# Patient Record
Sex: Male | Born: 1980 | Race: Black or African American | Hispanic: No | Marital: Single
Health system: Southern US, Community
[De-identification: ages and names within clinical notes are randomized; demographics above are authoritative.]

## PROBLEM LIST (undated history)

## (undated) DIAGNOSIS — R1033 Periumbilical pain: Secondary | ICD-10-CM

## (undated) DIAGNOSIS — F1290 Cannabis use, unspecified, uncomplicated: Secondary | ICD-10-CM

## (undated) DIAGNOSIS — T189XXA Foreign body of alimentary tract, part unspecified, initial encounter: Secondary | ICD-10-CM

## (undated) DIAGNOSIS — E039 Hypothyroidism, unspecified: Secondary | ICD-10-CM

## (undated) DIAGNOSIS — F609 Personality disorder, unspecified: Secondary | ICD-10-CM

## (undated) DIAGNOSIS — K439 Ventral hernia without obstruction or gangrene: Secondary | ICD-10-CM

## (undated) DIAGNOSIS — F4310 Post-traumatic stress disorder, unspecified: Secondary | ICD-10-CM

## (undated) DIAGNOSIS — F79 Unspecified intellectual disabilities: Secondary | ICD-10-CM

## (undated) DIAGNOSIS — F250 Schizoaffective disorder, bipolar type: Secondary | ICD-10-CM

## (undated) DIAGNOSIS — K5909 Other constipation: Secondary | ICD-10-CM

## (undated) DIAGNOSIS — K219 Gastro-esophageal reflux disease without esophagitis: Secondary | ICD-10-CM

## (undated) HISTORY — DX: Hypothyroidism, unspecified: E03.9

## (undated) HISTORY — DX: Gastro-esophageal reflux disease without esophagitis: K21.9

## (undated) HISTORY — DX: Other constipation: K59.09

## (undated) HISTORY — PX: FRACTURE SURGERY: SHX138

## (undated) HISTORY — DX: Ventral hernia without obstruction or gangrene: K43.9

## (undated) HISTORY — DX: Periumbilical pain: R10.33

## (undated) HISTORY — DX: Foreign body of alimentary tract, part unspecified, initial encounter: T18.9XXA

---

## 1999-02-09 ENCOUNTER — Inpatient Hospital Stay (HOSPITAL_COMMUNITY): Admission: RE | Admit: 1999-02-09 | Discharge: 1999-02-16 | Payer: Self-pay | Admitting: *Deleted

## 2008-04-27 ENCOUNTER — Emergency Department (HOSPITAL_COMMUNITY): Admission: EM | Admit: 2008-04-27 | Discharge: 2008-04-27 | Payer: Self-pay | Admitting: Emergency Medicine

## 2008-06-16 ENCOUNTER — Emergency Department (HOSPITAL_COMMUNITY): Admission: EM | Admit: 2008-06-16 | Discharge: 2008-06-19 | Payer: Self-pay | Admitting: Emergency Medicine

## 2008-06-17 ENCOUNTER — Inpatient Hospital Stay (HOSPITAL_COMMUNITY): Admission: AD | Admit: 2008-06-17 | Discharge: 2008-06-19 | Payer: Self-pay | Admitting: Psychiatry

## 2008-06-17 ENCOUNTER — Ambulatory Visit: Payer: Self-pay | Admitting: Psychiatry

## 2008-07-13 ENCOUNTER — Emergency Department (HOSPITAL_COMMUNITY): Admission: EM | Admit: 2008-07-13 | Discharge: 2008-07-14 | Payer: Self-pay | Admitting: Emergency Medicine

## 2008-07-14 ENCOUNTER — Inpatient Hospital Stay (HOSPITAL_COMMUNITY): Admission: EM | Admit: 2008-07-14 | Discharge: 2008-07-18 | Payer: Self-pay | Admitting: Psychiatry

## 2008-07-14 ENCOUNTER — Emergency Department (HOSPITAL_COMMUNITY): Admission: EM | Admit: 2008-07-14 | Discharge: 2008-07-14 | Payer: Self-pay | Admitting: Emergency Medicine

## 2010-09-14 LAB — COMPREHENSIVE METABOLIC PANEL
ALT: 15 U/L (ref 0–53)
Albumin: 4.1 g/dL (ref 3.5–5.2)
Alkaline Phosphatase: 74 U/L (ref 39–117)
Calcium: 9.4 mg/dL (ref 8.4–10.5)
Creatinine, Ser: 0.88 mg/dL (ref 0.4–1.5)
GFR calc Af Amer: >60 mL/min (ref >60)
Glucose, Bld: 80 mg/dL (ref 70–99)
Total Bilirubin: 0.7 mg/dL (ref 0.3–1.2)

## 2010-09-14 LAB — VALPROIC ACID LEVEL: Valproic Acid Lvl: 61.4 ug/mL (ref 50.0–100.0)

## 2010-09-14 LAB — TSH: TSH: 0.985 u[IU]/mL (ref 0.350–4.500)

## 2010-09-14 LAB — DIFFERENTIAL
Basophils Relative: 1 % (ref 0–1)
Eosinophils Relative: 2 % (ref 0–5)
Lymphs Abs: 1.1 10*3/uL (ref 0.7–4.0)
Monocytes Absolute: 0.7 10*3/uL (ref 0.1–1.0)
Monocytes Relative: 16 % — ABNORMAL HIGH (ref 3–12)
Neutrophils Relative %: 57 % (ref 43–77)

## 2010-09-14 LAB — URINALYSIS, ROUTINE W REFLEX MICROSCOPIC
Bilirubin Urine: NEGATIVE
Hgb urine dipstick: NEGATIVE
Nitrite: NEGATIVE
Protein, ur: NEGATIVE mg/dL
Urobilinogen, UA: 0.2 mg/dL (ref 0.0–1.0)

## 2010-09-14 LAB — CBC
HCT: 41.4 % (ref 39.0–52.0)
Hemoglobin: 13.4 g/dL (ref 13.0–17.0)
RBC: 4.82 MIL/uL (ref 4.22–5.81)
RDW: 13.6 % (ref 11.5–15.5)
WBC: 4.5 10*3/uL (ref 4.0–10.5)

## 2010-09-14 LAB — SALICYLATE LEVEL: Salicylate Lvl: <4 mg/dL (ref 2.8–20.0)

## 2010-09-14 LAB — RAPID URINE DRUG SCREEN, HOSP PERFORMED: Amphetamines: NOT DETECTED

## 2010-09-14 LAB — ETHANOL: Alcohol, Ethyl (B): <5 mg/dL (ref 0–10)

## 2010-09-14 LAB — LITHIUM LEVEL: Lithium Lvl: <0.25 mEq/L — ABNORMAL LOW (ref 0.80–1.40)

## 2010-09-15 LAB — DIFFERENTIAL
Basophils Relative: 1 % (ref 0–1)
Eosinophils Relative: 2 % (ref 0–5)
Lymphocytes Relative: 26 % (ref 12–46)
Monocytes Relative: 12 % (ref 3–12)
Neutro Abs: 4.8 10*3/uL (ref 1.7–7.7)
Neutrophils Relative %: 59 % (ref 43–77)

## 2010-09-15 LAB — CBC
Hemoglobin: 13.4 g/dL (ref 13.0–17.0)
MCHC: 32.8 g/dL (ref 30.0–36.0)
WBC: 8.2 10*3/uL (ref 4.0–10.5)

## 2010-09-15 LAB — RAPID URINE DRUG SCREEN, HOSP PERFORMED
Amphetamines: NOT DETECTED
Barbiturates: NOT DETECTED
Benzodiazepines: NOT DETECTED
Opiates: NOT DETECTED
Tetrahydrocannabinol: NOT DETECTED

## 2010-09-15 LAB — POCT I-STAT, CHEM 8
BUN: 13 mg/dL (ref 6–23)
Calcium, Ion: 1.2 mmol/L (ref 1.12–1.32)
HCT: 43 % (ref 39.0–52.0)
Potassium: 4.4 mEq/L (ref 3.5–5.1)
Sodium: 139 mEq/L (ref 135–145)
TCO2: 25 mmol/L (ref 0–100)

## 2010-09-15 LAB — URINALYSIS, ROUTINE W REFLEX MICROSCOPIC: Specific Gravity, Urine: 1.029 (ref 1.005–1.030)

## 2010-09-15 LAB — ETHANOL: Alcohol, Ethyl (B): 6 mg/dL (ref 0–10)

## 2010-10-13 NOTE — Discharge Summary (Signed)
NAMECOLLIN, Lawrence Morgan               ACCOUNT NO.:  [id number]   MEDICAL RECORD NO.:  [id number]          PATIENT TYPE:  IPS   LOCATION:  0405                          FACILITY:  [facility]   PHYSICIAN:  Anselm Jungling, MD  DATE OF BIRTH:  03/14/1981   DATE OF ADMISSION:  07/14/2008  DATE OF DISCHARGE:  07/18/2008                               DISCHARGE SUMMARY   IDENTIFYING DATA AND REASON FOR ADMISSION:  This was an inpatient  psychiatric admission for Lawrence Morgan, a 30 year old single African American  male with a long psychiatric history.  He was admitted due to  exacerbation of symptoms of chronic schizophrenia.  Please refer to the  admission note for further details pertaining to the symptoms,  circumstances and history that led to his hospitalization.  He was given  an initial Axis I diagnosis of schizoaffective disorder, NOS.   MEDICAL AND LABORATORY:  The patient was in good health without any  active or chronic medical problems, except for a recent ankle sprain,  for which he was given ibuprofen.  He was medically and physically  assessed by the psychiatric nurse practitioner.  There were no  significant medical issues.   HOSPITAL COURSE:  The patient was admitted to the adult inpatient  psychiatric service.  He presented as a well-nourished, normally-  developed [place] American male who was not interested in engaging with  the undersigned or the treatment staff for the most part.  He preferred  to spend his time in his room, mostly in bed, and needed a great deal of  encouragement to get up and attend groups.  He stated that he thought he  needed a brain transplant and this appeared to be not a comical  remark, but an actual reflection of his delusionality.   He had been a client of the [employer], and we were in touch with them throughout his stay.  The patient  was continued on his usual combination of Depakote and Risperdal,  including  Risperdal Consta 50 mg IM q.14 days.   The patient appeared to function at or near his normal baseline level.  We worked with the ACT team case managers towards a discharge plan.  By  the fifth hospital day, it was clear that the patient would not benefit  from further inpatient psychiatric services, and was ready to resume  services under the auspices of the [facility] team.   AFTERCARE:  As above.  The patient was to return to the services of the  ACT team on the day of discharge, July 18, 2008.   DISCHARGE MEDICATIONS:  1. Trazodone 50 mg nightly.  2. Depakote 1000 mg nightly.  3. Risperdal 3 mg nightly.  4. Risperdal Consta 50 mg IM q.14 days, next due July 31, 2008.  5. Ibuprofen 400-800 mg p.o. q.6 h., p.r.n. ankle pain.   DISCHARGE DIAGNOSES:  Axis I:  Schizoaffective disorder, not otherwise  specified.  Axis II:  Deferred.  Axis III:  Ankle sprain.  Axis IV:  Stressors severe.  Axis V:  Global Assessment of Functioning on  discharge 50.      Anselm Jungling, MD  Electronically Signed     SPB/MEDQ  D:  07/18/2008  T:  07/18/2008  Job:  [id number] [phone]

## 2010-10-13 NOTE — Discharge Summary (Signed)
NAMELAQUAN, BEIER               ACCOUNT NO.:  [id number]   MEDICAL RECORD NO.:  [id number]          PATIENT TYPE:  IPS   LOCATION:  0501                          FACILITY:  [facility]   PHYSICIAN:  Geoffery Lyons, M.D.      DATE OF BIRTH:  [birth year]   DATE OF ADMISSION:  06/17/2008  DATE OF DISCHARGE:  06/19/2008                               DISCHARGE SUMMARY   CHIEF COMPLAINT:  This was the first admission to [facility] for this 30 year old male who was staying at the shelter and  seemed to have drunk Clorox.  He is not clear as far as what happened.  Initially claimed he was upset with staff at the shelter.  They said he  was responding to auditory hallucinations.  Upon this assessment, he  denied any suicidal ideations.   PAST PSYCHIATRIC HISTORY:  Being followed up at the [facility] [place].  Not sure about the medications.  Caseworker endorsed that he was taking  Risperdal and Depakote, possibly Risperdal Consta and lithium.   ALCOHOL AND DRUG HISTORY:  Noncontributory.   MEDICATIONS:  1. Risperdal Consta 50 mg IM every 14 days.  2. Depakote 750 mg at night.  3. Risperdal 30 mg at night.  4. Lithium 300 mg.  Not clear how he was supposed to be taking it.   PHYSICAL EXAMINATION:  Failed to show any acute findings.   LABORATORY WORKUP:  TSH 0.985, Depakote level 61.4, lithium less than  0.25.   MENTAL STATUS EXAMINATION:  Initially very agitated, wanted his clothes.  Raised his voice, yelled, did not want to come out of the room, stayed  in the bed.  Later the same morning was told that the clothes were  identified and clean, he was more settled.  Much calmer in one-on-one  interaction, wanting to get a letter so he could go back to the shelter.  He minimized and he denied, did not want his family contacted.  Resistant to thinking about getting any other placement other than the  shelter.  No evidence of active delusions, although he was very reserved  and  guarded.  No evidence of active hallucinations, although initially  he endorsed that he heard voices.  Cognition very concrete, but oriented  to person, place and time.  Some gaps in memory.  Endorsed no suicidal  ideas.   ADMITTING DIAGNOSES:  AXIS I:  Schizophrenia, rule out schizoaffective  disorder, borderline intellectual functioning.  AXIS II:  No diagnosis.  AXIS III:  No diagnosis.  AXIS IV:  Moderate.  AXIS V:  Upon admission 35.  Highest GAF in the last year 60.   We contacted the [facility] and we contacted the [facility].  We  were informed that he refused to live in a group home and he preferred  to leave in the street.  He was going back to the shelter.  January 20th  he was in full contact with reality.  Endorsed no active suicidal or  homicidal ideas, no hallucinations or delusions.  Endorsed he was much  better and objectively he  was more spontaneous.  No evidence of acute  psychopathology.  Go back to the shelter.  Claimed saving his money to  eventually get a place.   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  AXIS II:  No diagnosis.  AXIS III:  No diagnosis.  AXIS IV: Moderate.  AXIS V:  On discharge 50.   DISCHARGE MEDICATIONS:  1. Depakote 500 mg 2 at night.  2. Risperdal 3 mg at night.  3. Trazodone 50 one at bedtime for sleep.  4. Continued Consta as per [facility].   FOLLOWUP:  [facility] in [facility]      Geoffery Lyons, M.D.  Electronically Signed     IL/MEDQ  D:  07/02/2008  T:  07/02/2008  Job:  [id number]

## 2016-06-01 DIAGNOSIS — Z79899 Other long term (current) drug therapy: Secondary | ICD-10-CM | POA: Diagnosis not present

## 2016-06-01 DIAGNOSIS — F250 Schizoaffective disorder, bipolar type: Secondary | ICD-10-CM | POA: Diagnosis not present

## 2016-06-10 DIAGNOSIS — F250 Schizoaffective disorder, bipolar type: Secondary | ICD-10-CM | POA: Diagnosis not present

## 2016-06-17 DIAGNOSIS — Z131 Encounter for screening for diabetes mellitus: Secondary | ICD-10-CM | POA: Diagnosis not present

## 2016-06-17 DIAGNOSIS — F200 Paranoid schizophrenia: Secondary | ICD-10-CM | POA: Diagnosis not present

## 2016-06-17 DIAGNOSIS — Z79899 Other long term (current) drug therapy: Secondary | ICD-10-CM | POA: Diagnosis not present

## 2016-06-17 DIAGNOSIS — Z13220 Encounter for screening for lipoid disorders: Secondary | ICD-10-CM | POA: Diagnosis not present

## 2016-06-17 DIAGNOSIS — N3281 Overactive bladder: Secondary | ICD-10-CM | POA: Diagnosis not present

## 2016-06-29 DIAGNOSIS — F250 Schizoaffective disorder, bipolar type: Secondary | ICD-10-CM | POA: Diagnosis not present

## 2016-06-29 DIAGNOSIS — Z79899 Other long term (current) drug therapy: Secondary | ICD-10-CM | POA: Diagnosis not present

## 2016-07-01 DIAGNOSIS — F17210 Nicotine dependence, cigarettes, uncomplicated: Secondary | ICD-10-CM | POA: Diagnosis not present

## 2016-07-01 DIAGNOSIS — J209 Acute bronchitis, unspecified: Secondary | ICD-10-CM | POA: Diagnosis not present

## 2016-07-01 DIAGNOSIS — J302 Other seasonal allergic rhinitis: Secondary | ICD-10-CM | POA: Diagnosis not present

## 2016-07-01 DIAGNOSIS — Z716 Tobacco abuse counseling: Secondary | ICD-10-CM | POA: Diagnosis not present

## 2016-07-09 DIAGNOSIS — F250 Schizoaffective disorder, bipolar type: Secondary | ICD-10-CM | POA: Diagnosis not present

## 2016-07-20 DIAGNOSIS — F250 Schizoaffective disorder, bipolar type: Secondary | ICD-10-CM | POA: Diagnosis not present

## 2016-07-20 DIAGNOSIS — Z79899 Other long term (current) drug therapy: Secondary | ICD-10-CM | POA: Diagnosis not present

## 2016-07-29 DIAGNOSIS — J302 Other seasonal allergic rhinitis: Secondary | ICD-10-CM | POA: Diagnosis not present

## 2016-07-29 DIAGNOSIS — N3281 Overactive bladder: Secondary | ICD-10-CM | POA: Diagnosis not present

## 2016-07-29 DIAGNOSIS — L308 Other specified dermatitis: Secondary | ICD-10-CM | POA: Diagnosis not present

## 2016-07-29 DIAGNOSIS — F200 Paranoid schizophrenia: Secondary | ICD-10-CM | POA: Diagnosis not present

## 2016-08-05 DIAGNOSIS — F250 Schizoaffective disorder, bipolar type: Secondary | ICD-10-CM | POA: Diagnosis not present

## 2016-08-20 DIAGNOSIS — Z79899 Other long term (current) drug therapy: Secondary | ICD-10-CM | POA: Diagnosis not present

## 2016-09-02 DIAGNOSIS — F250 Schizoaffective disorder, bipolar type: Secondary | ICD-10-CM | POA: Diagnosis not present

## 2016-09-22 DIAGNOSIS — Z79899 Other long term (current) drug therapy: Secondary | ICD-10-CM | POA: Diagnosis not present

## 2016-09-29 DIAGNOSIS — F250 Schizoaffective disorder, bipolar type: Secondary | ICD-10-CM | POA: Diagnosis not present

## 2016-09-30 DIAGNOSIS — F250 Schizoaffective disorder, bipolar type: Secondary | ICD-10-CM | POA: Diagnosis not present

## 2016-10-06 DIAGNOSIS — F1211 Cannabis abuse, in remission: Secondary | ICD-10-CM | POA: Diagnosis not present

## 2016-10-06 DIAGNOSIS — Z79899 Other long term (current) drug therapy: Secondary | ICD-10-CM | POA: Diagnosis not present

## 2016-10-15 DIAGNOSIS — F1211 Cannabis abuse, in remission: Secondary | ICD-10-CM | POA: Diagnosis not present

## 2016-10-15 DIAGNOSIS — Z79899 Other long term (current) drug therapy: Secondary | ICD-10-CM | POA: Diagnosis not present

## 2016-10-28 DIAGNOSIS — F250 Schizoaffective disorder, bipolar type: Secondary | ICD-10-CM | POA: Diagnosis not present

## 2016-11-16 DIAGNOSIS — Z79899 Other long term (current) drug therapy: Secondary | ICD-10-CM | POA: Diagnosis not present

## 2016-11-16 DIAGNOSIS — F1211 Cannabis abuse, in remission: Secondary | ICD-10-CM | POA: Diagnosis not present

## 2016-11-25 DIAGNOSIS — F250 Schizoaffective disorder, bipolar type: Secondary | ICD-10-CM | POA: Diagnosis not present

## 2016-12-03 DIAGNOSIS — F250 Schizoaffective disorder, bipolar type: Secondary | ICD-10-CM | POA: Diagnosis not present

## 2016-12-03 DIAGNOSIS — F4310 Post-traumatic stress disorder, unspecified: Secondary | ICD-10-CM | POA: Diagnosis not present

## 2016-12-03 DIAGNOSIS — F1211 Cannabis abuse, in remission: Secondary | ICD-10-CM | POA: Diagnosis not present

## 2016-12-10 DIAGNOSIS — F1211 Cannabis abuse, in remission: Secondary | ICD-10-CM | POA: Diagnosis not present

## 2016-12-10 DIAGNOSIS — Z79899 Other long term (current) drug therapy: Secondary | ICD-10-CM | POA: Diagnosis not present

## 2016-12-14 DIAGNOSIS — F250 Schizoaffective disorder, bipolar type: Secondary | ICD-10-CM | POA: Diagnosis not present

## 2016-12-16 DIAGNOSIS — Z716 Tobacco abuse counseling: Secondary | ICD-10-CM | POA: Diagnosis not present

## 2016-12-16 DIAGNOSIS — B351 Tinea unguium: Secondary | ICD-10-CM | POA: Diagnosis not present

## 2016-12-16 DIAGNOSIS — F17210 Nicotine dependence, cigarettes, uncomplicated: Secondary | ICD-10-CM | POA: Diagnosis not present

## 2016-12-16 DIAGNOSIS — K5909 Other constipation: Secondary | ICD-10-CM | POA: Diagnosis not present

## 2016-12-16 DIAGNOSIS — F200 Paranoid schizophrenia: Secondary | ICD-10-CM | POA: Diagnosis not present

## 2016-12-16 DIAGNOSIS — R634 Abnormal weight loss: Secondary | ICD-10-CM | POA: Diagnosis not present

## 2016-12-23 DIAGNOSIS — F250 Schizoaffective disorder, bipolar type: Secondary | ICD-10-CM | POA: Diagnosis not present

## 2017-01-07 DIAGNOSIS — Z79899 Other long term (current) drug therapy: Secondary | ICD-10-CM | POA: Diagnosis not present

## 2017-01-17 ENCOUNTER — Ambulatory Visit: Payer: Self-pay | Admitting: Podiatry

## 2017-01-20 DIAGNOSIS — F250 Schizoaffective disorder, bipolar type: Secondary | ICD-10-CM | POA: Diagnosis not present

## 2017-02-02 ENCOUNTER — Ambulatory Visit (INDEPENDENT_AMBULATORY_CARE_PROVIDER_SITE_OTHER): Payer: Medicare Other | Admitting: Podiatry

## 2017-02-02 ENCOUNTER — Encounter: Payer: Self-pay | Admitting: Podiatry

## 2017-02-02 VITALS — Ht 69.0 in | Wt 180.0 lb

## 2017-02-02 DIAGNOSIS — B351 Tinea unguium: Secondary | ICD-10-CM | POA: Diagnosis not present

## 2017-02-02 DIAGNOSIS — L600 Ingrowing nail: Secondary | ICD-10-CM

## 2017-02-02 NOTE — Progress Notes (Signed)
SUBJECTIVE: 36 y.o. year old male presents complaining of painful nail on right great toe. Patient request the nail be removed surgically.  REVIEW OF SYSTEMS: A comprehensive review of systems was negative except for: being treated fro schizophrenic and lives with his care taker.  OBJECTIVE: DERMATOLOGIC EXAMINATION: Nails: Thick dystrophic nail painful in closed in shoes. Abnormal nail growth protruding dorsally right great toe.   VASCULAR EXAMINATION OF LOWER LIMBS: Pedal pulses: All pedal pulses are palpable with normal pulsation.  Capillary Filling times within 3 seconds in all digits.  No edema or erythema noted. Temperature gradient from tibial crest to dorsum of foot is within normal bilateral.  NEUROLOGIC EXAMINATION OF THE LOWER LIMBS: Achilles DTR is present and within normal. Monofilament (Semmes-Weinstein 10-gm) sensory testing positive 6 out of 6, bilateral. Vibratory sensations(128Hz  turning fork) intact at medial and lateral forefoot bilateral.  Sharp and Dull discriminatory sensations at the plantar ball of hallux is intact bilateral.   MUSCULOSKELETAL EXAMINATION: No gross deformities noted.  ASSESSMENT: Onychomycosis. Abnormal nail growth right great toe. Pain on foot.  PLAN: Reviewed findings and available treatment options.  Explained high recurrence rate after permanent nail removal due to deformity. All nails debrided. Return in 3 month.

## 2017-02-02 NOTE — Patient Instructions (Addendum)
Seen for painful nails. Reviewed findings and available options. All nails debrided. Return in 3 months or as needed.

## 2017-02-03 ENCOUNTER — Encounter: Payer: Self-pay | Admitting: Podiatry

## 2017-02-04 DIAGNOSIS — Z79899 Other long term (current) drug therapy: Secondary | ICD-10-CM | POA: Diagnosis not present

## 2017-02-09 DIAGNOSIS — F250 Schizoaffective disorder, bipolar type: Secondary | ICD-10-CM | POA: Diagnosis not present

## 2017-02-16 DIAGNOSIS — F17210 Nicotine dependence, cigarettes, uncomplicated: Secondary | ICD-10-CM | POA: Diagnosis not present

## 2017-02-16 DIAGNOSIS — Z87891 Personal history of nicotine dependence: Secondary | ICD-10-CM | POA: Diagnosis not present

## 2017-02-18 DIAGNOSIS — H52213 Irregular astigmatism, bilateral: Secondary | ICD-10-CM | POA: Diagnosis not present

## 2017-02-18 DIAGNOSIS — H40013 Open angle with borderline findings, low risk, bilateral: Secondary | ICD-10-CM | POA: Diagnosis not present

## 2017-02-18 DIAGNOSIS — H04123 Dry eye syndrome of bilateral lacrimal glands: Secondary | ICD-10-CM | POA: Diagnosis not present

## 2017-02-18 DIAGNOSIS — H35413 Lattice degeneration of retina, bilateral: Secondary | ICD-10-CM | POA: Diagnosis not present

## 2017-02-22 DIAGNOSIS — F200 Paranoid schizophrenia: Secondary | ICD-10-CM | POA: Diagnosis not present

## 2017-02-22 DIAGNOSIS — R634 Abnormal weight loss: Secondary | ICD-10-CM | POA: Diagnosis not present

## 2017-02-22 DIAGNOSIS — B351 Tinea unguium: Secondary | ICD-10-CM | POA: Diagnosis not present

## 2017-02-22 DIAGNOSIS — N3281 Overactive bladder: Secondary | ICD-10-CM | POA: Diagnosis not present

## 2017-02-22 DIAGNOSIS — F250 Schizoaffective disorder, bipolar type: Secondary | ICD-10-CM | POA: Diagnosis not present

## 2017-03-04 DIAGNOSIS — F1211 Cannabis abuse, in remission: Secondary | ICD-10-CM | POA: Diagnosis not present

## 2017-03-04 DIAGNOSIS — F4310 Post-traumatic stress disorder, unspecified: Secondary | ICD-10-CM | POA: Diagnosis not present

## 2017-03-04 DIAGNOSIS — F70 Mild intellectual disabilities: Secondary | ICD-10-CM | POA: Diagnosis not present

## 2017-03-04 DIAGNOSIS — F250 Schizoaffective disorder, bipolar type: Secondary | ICD-10-CM | POA: Diagnosis not present

## 2017-03-25 DIAGNOSIS — F250 Schizoaffective disorder, bipolar type: Secondary | ICD-10-CM | POA: Diagnosis not present

## 2017-04-01 DIAGNOSIS — F70 Mild intellectual disabilities: Secondary | ICD-10-CM | POA: Diagnosis not present

## 2017-04-01 DIAGNOSIS — F4310 Post-traumatic stress disorder, unspecified: Secondary | ICD-10-CM | POA: Diagnosis not present

## 2017-04-01 DIAGNOSIS — F250 Schizoaffective disorder, bipolar type: Secondary | ICD-10-CM | POA: Diagnosis not present

## 2017-04-01 DIAGNOSIS — F1211 Cannabis abuse, in remission: Secondary | ICD-10-CM | POA: Diagnosis not present

## 2017-04-28 DIAGNOSIS — F250 Schizoaffective disorder, bipolar type: Secondary | ICD-10-CM | POA: Diagnosis not present

## 2017-04-28 DIAGNOSIS — Z79899 Other long term (current) drug therapy: Secondary | ICD-10-CM | POA: Diagnosis not present

## 2017-05-04 ENCOUNTER — Ambulatory Visit (INDEPENDENT_AMBULATORY_CARE_PROVIDER_SITE_OTHER): Payer: Medicare Other | Admitting: Podiatry

## 2017-05-04 ENCOUNTER — Encounter: Payer: Self-pay | Admitting: Podiatry

## 2017-05-04 DIAGNOSIS — B351 Tinea unguium: Secondary | ICD-10-CM

## 2017-05-04 DIAGNOSIS — L600 Ingrowing nail: Secondary | ICD-10-CM | POA: Diagnosis not present

## 2017-05-04 NOTE — Progress Notes (Signed)
Subjective: 36 y.o. year old male patient presents with his care giver complaining of painful nails. Patient requests toe nails trimmed. No new problem noted.  Objective: Dermatologic: Thick yellow deformed nails x 10.  Dystrophic ingrown hallucal nails without open skin bilateral. Vascular: Pedal pulses are all palpable. Orthopedic: No gross osseous deformities noted. Neurologic: All epicritic and tactile sensations grossly intact.  Assessment: Dystrophic mycotic nails x 10. Ingrown hallucal nail both great toe.   Treatment: All mycotic nail debrided.  Return in 3 months or as needed.

## 2017-05-04 NOTE — Patient Instructions (Signed)
Seen for hypertrophic nails. All nails debrided. Return in 6 months or as needed.  

## 2017-05-05 DIAGNOSIS — F250 Schizoaffective disorder, bipolar type: Secondary | ICD-10-CM | POA: Diagnosis not present

## 2017-05-27 DIAGNOSIS — F250 Schizoaffective disorder, bipolar type: Secondary | ICD-10-CM | POA: Diagnosis not present

## 2017-05-27 DIAGNOSIS — F70 Mild intellectual disabilities: Secondary | ICD-10-CM | POA: Diagnosis not present

## 2017-05-27 DIAGNOSIS — F1211 Cannabis abuse, in remission: Secondary | ICD-10-CM | POA: Diagnosis not present

## 2017-05-27 DIAGNOSIS — F4310 Post-traumatic stress disorder, unspecified: Secondary | ICD-10-CM | POA: Diagnosis not present

## 2017-06-02 DIAGNOSIS — F17210 Nicotine dependence, cigarettes, uncomplicated: Secondary | ICD-10-CM | POA: Diagnosis not present

## 2017-06-02 DIAGNOSIS — F250 Schizoaffective disorder, bipolar type: Secondary | ICD-10-CM | POA: Diagnosis not present

## 2017-06-02 DIAGNOSIS — N3281 Overactive bladder: Secondary | ICD-10-CM | POA: Diagnosis not present

## 2017-06-02 DIAGNOSIS — F200 Paranoid schizophrenia: Secondary | ICD-10-CM | POA: Diagnosis not present

## 2017-06-02 DIAGNOSIS — Z716 Tobacco abuse counseling: Secondary | ICD-10-CM | POA: Diagnosis not present

## 2017-06-02 DIAGNOSIS — J302 Other seasonal allergic rhinitis: Secondary | ICD-10-CM | POA: Diagnosis not present

## 2017-06-02 DIAGNOSIS — K5909 Other constipation: Secondary | ICD-10-CM | POA: Diagnosis not present

## 2017-06-30 DIAGNOSIS — F250 Schizoaffective disorder, bipolar type: Secondary | ICD-10-CM | POA: Diagnosis not present

## 2017-07-05 DIAGNOSIS — F250 Schizoaffective disorder, bipolar type: Secondary | ICD-10-CM | POA: Diagnosis not present

## 2017-07-05 DIAGNOSIS — F70 Mild intellectual disabilities: Secondary | ICD-10-CM | POA: Diagnosis not present

## 2017-07-05 DIAGNOSIS — F1211 Cannabis abuse, in remission: Secondary | ICD-10-CM | POA: Diagnosis not present

## 2017-07-05 DIAGNOSIS — F4310 Post-traumatic stress disorder, unspecified: Secondary | ICD-10-CM | POA: Diagnosis not present

## 2017-07-28 DIAGNOSIS — F70 Mild intellectual disabilities: Secondary | ICD-10-CM | POA: Diagnosis not present

## 2017-07-28 DIAGNOSIS — F4310 Post-traumatic stress disorder, unspecified: Secondary | ICD-10-CM | POA: Diagnosis not present

## 2017-07-28 DIAGNOSIS — F1211 Cannabis abuse, in remission: Secondary | ICD-10-CM | POA: Diagnosis not present

## 2017-07-28 DIAGNOSIS — F250 Schizoaffective disorder, bipolar type: Secondary | ICD-10-CM | POA: Diagnosis not present

## 2017-08-23 DIAGNOSIS — F250 Schizoaffective disorder, bipolar type: Secondary | ICD-10-CM | POA: Diagnosis not present

## 2017-08-25 DIAGNOSIS — F70 Mild intellectual disabilities: Secondary | ICD-10-CM | POA: Diagnosis not present

## 2017-08-25 DIAGNOSIS — F250 Schizoaffective disorder, bipolar type: Secondary | ICD-10-CM | POA: Diagnosis not present

## 2017-08-25 DIAGNOSIS — F1211 Cannabis abuse, in remission: Secondary | ICD-10-CM | POA: Diagnosis not present

## 2017-08-25 DIAGNOSIS — F4310 Post-traumatic stress disorder, unspecified: Secondary | ICD-10-CM | POA: Diagnosis not present

## 2017-09-22 DIAGNOSIS — F250 Schizoaffective disorder, bipolar type: Secondary | ICD-10-CM | POA: Diagnosis not present

## 2017-09-22 DIAGNOSIS — F70 Mild intellectual disabilities: Secondary | ICD-10-CM | POA: Diagnosis not present

## 2017-09-22 DIAGNOSIS — F4310 Post-traumatic stress disorder, unspecified: Secondary | ICD-10-CM | POA: Diagnosis not present

## 2017-09-22 DIAGNOSIS — F1211 Cannabis abuse, in remission: Secondary | ICD-10-CM | POA: Diagnosis not present

## 2017-10-20 DIAGNOSIS — F70 Mild intellectual disabilities: Secondary | ICD-10-CM | POA: Diagnosis not present

## 2017-10-20 DIAGNOSIS — F4310 Post-traumatic stress disorder, unspecified: Secondary | ICD-10-CM | POA: Diagnosis not present

## 2017-10-20 DIAGNOSIS — F250 Schizoaffective disorder, bipolar type: Secondary | ICD-10-CM | POA: Diagnosis not present

## 2017-10-20 DIAGNOSIS — F1211 Cannabis abuse, in remission: Secondary | ICD-10-CM | POA: Diagnosis not present

## 2017-11-02 ENCOUNTER — Ambulatory Visit: Payer: Medicare Other | Admitting: Podiatry

## 2017-11-10 DIAGNOSIS — Z131 Encounter for screening for diabetes mellitus: Secondary | ICD-10-CM | POA: Diagnosis not present

## 2017-11-10 DIAGNOSIS — F17210 Nicotine dependence, cigarettes, uncomplicated: Secondary | ICD-10-CM | POA: Diagnosis not present

## 2017-11-10 DIAGNOSIS — F200 Paranoid schizophrenia: Secondary | ICD-10-CM | POA: Diagnosis not present

## 2017-11-10 DIAGNOSIS — Z0000 Encounter for general adult medical examination without abnormal findings: Secondary | ICD-10-CM | POA: Diagnosis not present

## 2017-11-10 DIAGNOSIS — Z13220 Encounter for screening for lipoid disorders: Secondary | ICD-10-CM | POA: Diagnosis not present

## 2017-11-10 DIAGNOSIS — Z716 Tobacco abuse counseling: Secondary | ICD-10-CM | POA: Diagnosis not present

## 2017-11-17 ENCOUNTER — Ambulatory Visit (INDEPENDENT_AMBULATORY_CARE_PROVIDER_SITE_OTHER): Payer: Medicare Other | Admitting: Podiatry

## 2017-11-17 ENCOUNTER — Encounter: Payer: Self-pay | Admitting: Podiatry

## 2017-11-17 DIAGNOSIS — L600 Ingrowing nail: Secondary | ICD-10-CM | POA: Diagnosis not present

## 2017-11-17 DIAGNOSIS — F70 Mild intellectual disabilities: Secondary | ICD-10-CM | POA: Diagnosis not present

## 2017-11-17 DIAGNOSIS — F1211 Cannabis abuse, in remission: Secondary | ICD-10-CM | POA: Diagnosis not present

## 2017-11-17 DIAGNOSIS — M79672 Pain in left foot: Secondary | ICD-10-CM | POA: Diagnosis not present

## 2017-11-17 DIAGNOSIS — M79671 Pain in right foot: Secondary | ICD-10-CM | POA: Diagnosis not present

## 2017-11-17 DIAGNOSIS — F250 Schizoaffective disorder, bipolar type: Secondary | ICD-10-CM | POA: Diagnosis not present

## 2017-11-17 DIAGNOSIS — F4310 Post-traumatic stress disorder, unspecified: Secondary | ICD-10-CM | POA: Diagnosis not present

## 2017-11-17 DIAGNOSIS — B351 Tinea unguium: Secondary | ICD-10-CM

## 2017-11-17 NOTE — Patient Instructions (Signed)
Seen for hypertrophic nails. Noted of ingrown hallucal nails. All nails debrided. Return in 6 month or sooner if needed.

## 2017-11-17 NOTE — Progress Notes (Signed)
Subjective: 37 y.o. year old male patient presents complaining of painful nails. Patient is accompanied by his care taker. Stated that he has been scrubbing his nails after each shower.  Objective: Dermatologic: Thick yellow deformed nails  X 10 with ingrown hallucal nails bilateral. Vascular: Pedal pulses are all palpable. Orthopedic: Contracted lesser digits bilateral. Neurologic: All epicritic and tactile sensations grossly intact.  Assessment: Dystrophic mycotic nails x 10. Ingrown hallucal nails bilateral. Painful nails.  Treatment: All mycotic nails debrided.  May return in 6 months as requested.

## 2017-12-15 DIAGNOSIS — F1211 Cannabis abuse, in remission: Secondary | ICD-10-CM | POA: Diagnosis not present

## 2017-12-15 DIAGNOSIS — F250 Schizoaffective disorder, bipolar type: Secondary | ICD-10-CM | POA: Diagnosis not present

## 2017-12-15 DIAGNOSIS — F70 Mild intellectual disabilities: Secondary | ICD-10-CM | POA: Diagnosis not present

## 2017-12-15 DIAGNOSIS — F4310 Post-traumatic stress disorder, unspecified: Secondary | ICD-10-CM | POA: Diagnosis not present

## 2017-12-19 DIAGNOSIS — Z79899 Other long term (current) drug therapy: Secondary | ICD-10-CM | POA: Diagnosis not present

## 2017-12-19 DIAGNOSIS — F250 Schizoaffective disorder, bipolar type: Secondary | ICD-10-CM | POA: Diagnosis not present

## 2018-01-10 DIAGNOSIS — F250 Schizoaffective disorder, bipolar type: Secondary | ICD-10-CM | POA: Diagnosis not present

## 2018-01-10 DIAGNOSIS — Z79899 Other long term (current) drug therapy: Secondary | ICD-10-CM | POA: Diagnosis not present

## 2018-01-11 DIAGNOSIS — F4310 Post-traumatic stress disorder, unspecified: Secondary | ICD-10-CM | POA: Diagnosis not present

## 2018-01-11 DIAGNOSIS — F1211 Cannabis abuse, in remission: Secondary | ICD-10-CM | POA: Diagnosis not present

## 2018-01-11 DIAGNOSIS — F70 Mild intellectual disabilities: Secondary | ICD-10-CM | POA: Diagnosis not present

## 2018-01-11 DIAGNOSIS — F250 Schizoaffective disorder, bipolar type: Secondary | ICD-10-CM | POA: Diagnosis not present

## 2018-02-08 DIAGNOSIS — F250 Schizoaffective disorder, bipolar type: Secondary | ICD-10-CM | POA: Diagnosis not present

## 2018-02-08 DIAGNOSIS — Z79899 Other long term (current) drug therapy: Secondary | ICD-10-CM | POA: Diagnosis not present

## 2018-02-09 DIAGNOSIS — Z716 Tobacco abuse counseling: Secondary | ICD-10-CM | POA: Diagnosis not present

## 2018-02-09 DIAGNOSIS — K5909 Other constipation: Secondary | ICD-10-CM | POA: Diagnosis not present

## 2018-02-09 DIAGNOSIS — F200 Paranoid schizophrenia: Secondary | ICD-10-CM | POA: Diagnosis not present

## 2018-02-09 DIAGNOSIS — J302 Other seasonal allergic rhinitis: Secondary | ICD-10-CM | POA: Diagnosis not present

## 2018-02-09 DIAGNOSIS — F17210 Nicotine dependence, cigarettes, uncomplicated: Secondary | ICD-10-CM | POA: Diagnosis not present

## 2018-03-08 DIAGNOSIS — F250 Schizoaffective disorder, bipolar type: Secondary | ICD-10-CM | POA: Diagnosis not present

## 2018-03-08 DIAGNOSIS — Z79899 Other long term (current) drug therapy: Secondary | ICD-10-CM | POA: Diagnosis not present

## 2018-03-09 DIAGNOSIS — F4310 Post-traumatic stress disorder, unspecified: Secondary | ICD-10-CM | POA: Diagnosis not present

## 2018-03-09 DIAGNOSIS — F1211 Cannabis abuse, in remission: Secondary | ICD-10-CM | POA: Diagnosis not present

## 2018-03-09 DIAGNOSIS — F70 Mild intellectual disabilities: Secondary | ICD-10-CM | POA: Diagnosis not present

## 2018-03-09 DIAGNOSIS — F250 Schizoaffective disorder, bipolar type: Secondary | ICD-10-CM | POA: Diagnosis not present

## 2018-03-28 DIAGNOSIS — F250 Schizoaffective disorder, bipolar type: Secondary | ICD-10-CM | POA: Diagnosis not present

## 2018-03-28 DIAGNOSIS — F70 Mild intellectual disabilities: Secondary | ICD-10-CM | POA: Diagnosis not present

## 2018-03-28 DIAGNOSIS — F1211 Cannabis abuse, in remission: Secondary | ICD-10-CM | POA: Diagnosis not present

## 2018-03-28 DIAGNOSIS — F4310 Post-traumatic stress disorder, unspecified: Secondary | ICD-10-CM | POA: Diagnosis not present

## 2018-04-06 DIAGNOSIS — F250 Schizoaffective disorder, bipolar type: Secondary | ICD-10-CM | POA: Diagnosis not present

## 2018-04-06 DIAGNOSIS — F4310 Post-traumatic stress disorder, unspecified: Secondary | ICD-10-CM | POA: Diagnosis not present

## 2018-04-06 DIAGNOSIS — Z79899 Other long term (current) drug therapy: Secondary | ICD-10-CM | POA: Diagnosis not present

## 2018-04-06 DIAGNOSIS — F1211 Cannabis abuse, in remission: Secondary | ICD-10-CM | POA: Diagnosis not present

## 2018-04-06 DIAGNOSIS — F70 Mild intellectual disabilities: Secondary | ICD-10-CM | POA: Diagnosis not present

## 2018-04-29 ENCOUNTER — Encounter (HOSPITAL_COMMUNITY): Payer: Self-pay | Admitting: Emergency Medicine

## 2018-04-29 ENCOUNTER — Other Ambulatory Visit: Payer: Self-pay

## 2018-04-29 ENCOUNTER — Emergency Department (HOSPITAL_COMMUNITY): Payer: Medicare Other

## 2018-04-29 ENCOUNTER — Inpatient Hospital Stay (HOSPITAL_COMMUNITY)
Admission: EM | Admit: 2018-04-29 | Discharge: 2018-05-10 | DRG: 394 | Disposition: A | Discharge status: Home / Self Care | Payer: Medicare Other | Source: Ambulatory Visit | Attending: Internal Medicine | Admitting: Internal Medicine

## 2018-04-29 DIAGNOSIS — Y92099 Unspecified place in other non-institutional residence as the place of occurrence of the external cause: Secondary | ICD-10-CM

## 2018-04-29 DIAGNOSIS — T182XXA Foreign body in stomach, initial encounter: Secondary | ICD-10-CM | POA: Diagnosis not present

## 2018-04-29 DIAGNOSIS — F259 Schizoaffective disorder, unspecified: Secondary | ICD-10-CM

## 2018-04-29 DIAGNOSIS — Z743 Need for continuous supervision: Secondary | ICD-10-CM | POA: Diagnosis not present

## 2018-04-29 DIAGNOSIS — F250 Schizoaffective disorder, bipolar type: Secondary | ICD-10-CM | POA: Diagnosis not present

## 2018-04-29 DIAGNOSIS — R45851 Suicidal ideations: Secondary | ICD-10-CM | POA: Diagnosis not present

## 2018-04-29 DIAGNOSIS — F632 Kleptomania: Secondary | ICD-10-CM | POA: Diagnosis present

## 2018-04-29 DIAGNOSIS — T189XXA Foreign body of alimentary tract, part unspecified, initial encounter: Secondary | ICD-10-CM | POA: Diagnosis not present

## 2018-04-29 DIAGNOSIS — F70 Mild intellectual disabilities: Secondary | ICD-10-CM | POA: Diagnosis present

## 2018-04-29 DIAGNOSIS — F609 Personality disorder, unspecified: Secondary | ICD-10-CM | POA: Diagnosis present

## 2018-04-29 DIAGNOSIS — K449 Diaphragmatic hernia without obstruction or gangrene: Secondary | ICD-10-CM | POA: Diagnosis not present

## 2018-04-29 DIAGNOSIS — Z888 Allergy status to other drugs, medicaments and biological substances status: Secondary | ICD-10-CM

## 2018-04-29 DIAGNOSIS — R110 Nausea: Secondary | ICD-10-CM | POA: Diagnosis not present

## 2018-04-29 DIAGNOSIS — Z751 Person awaiting admission to adequate facility elsewhere: Secondary | ICD-10-CM

## 2018-04-29 DIAGNOSIS — X838XXA Intentional self-harm by other specified means, initial encounter: Secondary | ICD-10-CM

## 2018-04-29 DIAGNOSIS — Z79899 Other long term (current) drug therapy: Secondary | ICD-10-CM

## 2018-04-29 DIAGNOSIS — F4310 Post-traumatic stress disorder, unspecified: Secondary | ICD-10-CM | POA: Diagnosis present

## 2018-04-29 DIAGNOSIS — F17290 Nicotine dependence, other tobacco product, uncomplicated: Secondary | ICD-10-CM | POA: Diagnosis present

## 2018-04-29 DIAGNOSIS — R109 Unspecified abdominal pain: Secondary | ICD-10-CM | POA: Diagnosis not present

## 2018-04-29 DIAGNOSIS — R1084 Generalized abdominal pain: Secondary | ICD-10-CM | POA: Diagnosis not present

## 2018-04-29 DIAGNOSIS — Z91013 Allergy to seafood: Secondary | ICD-10-CM

## 2018-04-29 HISTORY — DX: Unspecified intellectual disabilities: F79

## 2018-04-29 HISTORY — DX: Cannabis use, unspecified, uncomplicated: F12.90

## 2018-04-29 HISTORY — DX: Personality disorder, unspecified: F60.9

## 2018-04-29 HISTORY — DX: Post-traumatic stress disorder, unspecified: F43.10

## 2018-04-29 HISTORY — DX: Schizoaffective disorder, unspecified: F25.9

## 2018-04-29 HISTORY — DX: Schizoaffective disorder, bipolar type: F25.0

## 2018-04-29 LAB — CBC WITH DIFFERENTIAL/PLATELET
Abs Immature Granulocytes: 0.01 10*3/uL (ref 0.00–0.07)
Basophils Absolute: 0 10*3/uL (ref 0.0–0.1)
Basophils Relative: 1 %
Eosinophils Absolute: 0 10*3/uL (ref 0.0–0.5)
Eosinophils Relative: 1 %
HCT: 41.7 % (ref 39.0–52.0)
HEMOGLOBIN: 12.5 g/dL — AB (ref 13.0–17.0)
Immature Granulocytes: 0 %
Lymphocytes Relative: 25 %
Lymphs Abs: 1.1 10*3/uL (ref 0.7–4.0)
MCH: 26.9 pg (ref 26.0–34.0)
MCHC: 30 g/dL (ref 30.0–36.0)
MCV: 89.9 fL (ref 80.0–100.0)
MONOS PCT: 12 %
Monocytes Absolute: 0.5 10*3/uL (ref 0.1–1.0)
Neutro Abs: 2.8 10*3/uL (ref 1.7–7.7)
Neutrophils Relative %: 61 %
Platelets: 222 10*3/uL (ref 150–400)
RBC: 4.64 MIL/uL (ref 4.22–5.81)
RDW: 13.5 % (ref 11.5–15.5)
WBC: 4.5 10*3/uL (ref 4.0–10.5)
nRBC: 0 % (ref 0.0–0.2)

## 2018-04-29 LAB — COMPREHENSIVE METABOLIC PANEL
ALBUMIN: 3.8 g/dL (ref 3.5–5.0)
ALT: 13 U/L (ref 0–44)
AST: 16 U/L (ref 15–41)
Alkaline Phosphatase: 64 U/L (ref 38–126)
Anion gap: 6 (ref 5–15)
BUN: 15 mg/dL (ref 6–20)
CHLORIDE: 105 mmol/L (ref 98–111)
CO2: 27 mmol/L (ref 22–32)
Calcium: 9.5 mg/dL (ref 8.9–10.3)
Creatinine, Ser: 1.17 mg/dL (ref 0.61–1.24)
GFR calc Af Amer: >60 mL/min (ref >60)
GFR calc non Af Amer: >60 mL/min (ref >60)
Glucose, Bld: 78 mg/dL (ref 70–99)
Potassium: 4 mmol/L (ref 3.5–5.1)
Sodium: 138 mmol/L (ref 135–145)
Total Bilirubin: 0.6 mg/dL (ref 0.3–1.2)
Total Protein: 6.4 g/dL — ABNORMAL LOW (ref 6.5–8.1)

## 2018-04-29 LAB — ACETAMINOPHEN LEVEL: Acetaminophen (Tylenol), Serum: <10 ug/mL — ABNORMAL LOW (ref 10–30)

## 2018-04-29 LAB — RAPID URINE DRUG SCREEN, HOSP PERFORMED
AMPHETAMINES: NOT DETECTED
Barbiturates: NOT DETECTED
Benzodiazepines: NOT DETECTED
Cocaine: NOT DETECTED
Opiates: NOT DETECTED
Tetrahydrocannabinol: NOT DETECTED

## 2018-04-29 LAB — SALICYLATE LEVEL: Salicylate Lvl: <7 mg/dL (ref 2.8–30.0)

## 2018-04-29 LAB — ETHANOL: Alcohol, Ethyl (B): <10 mg/dL (ref <10)

## 2018-04-29 MED ORDER — LITHIUM CARBONATE ER 300 MG PO TBCR
600.0000 mg | EXTENDED_RELEASE_TABLET | Freq: Every day | ORAL | Status: DC
  Filled 2018-04-29: qty 2

## 2018-04-29 MED ORDER — IOHEXOL 300 MG/ML  SOLN
100.0000 mL | Freq: Once | INTRAMUSCULAR | Status: AC | PRN
  Administered 2018-04-29: 100 mL via INTRAVENOUS

## 2018-04-29 MED ORDER — LITHIUM CARBONATE 300 MG PO CAPS
300.0000 mg | ORAL_CAPSULE | Freq: Every day | ORAL | Status: DC
  Administered 2018-04-30: 300 mg via ORAL
  Filled 2018-04-29: qty 1

## 2018-04-29 MED ORDER — ONDANSETRON HCL 4 MG PO TABS: 4.0000 mg | ORAL_TABLET | Freq: Four times a day (QID) | ORAL | Status: DC | PRN

## 2018-04-29 MED ORDER — LORATADINE 10 MG PO TABS
10.0000 mg | ORAL_TABLET | Freq: Every day | ORAL | Status: DC
  Administered 2018-04-30 – 2018-05-10 (×11): 10 mg via ORAL
  Filled 2018-04-29 (×11): qty 1

## 2018-04-29 MED ORDER — ATOMOXETINE HCL 25 MG PO CAPS
25.0000 mg | ORAL_CAPSULE | Freq: Every day | ORAL | Status: DC
  Administered 2018-04-30 – 2018-05-10 (×11): 25 mg via ORAL
  Filled 2018-04-29 (×12): qty 1

## 2018-04-29 MED ORDER — CLOZAPINE 100 MG PO TABS
200.0000 mg | ORAL_TABLET | Freq: Every day | ORAL | Status: DC
  Administered 2018-04-29 – 2018-05-02 (×4): 200 mg via ORAL
  Filled 2018-04-29 (×5): qty 2

## 2018-04-29 MED ORDER — ALBUTEROL SULFATE (2.5 MG/3ML) 0.083% IN NEBU: 2.5000 mg | INHALATION_SOLUTION | Freq: Four times a day (QID) | RESPIRATORY_TRACT | Status: DC | PRN

## 2018-04-29 MED ORDER — SERTRALINE HCL 100 MG PO TABS
100.0000 mg | ORAL_TABLET | Freq: Every day | ORAL | Status: DC
  Administered 2018-04-30 – 2018-05-10 (×11): 100 mg via ORAL
  Filled 2018-04-29 (×11): qty 1

## 2018-04-29 MED ORDER — SODIUM CHLORIDE 0.9 % IV SOLN
INTRAVENOUS | Status: DC
  Administered 2018-04-29: 21:00:00 via INTRAVENOUS

## 2018-04-29 MED ORDER — ALBUTEROL SULFATE HFA 108 (90 BASE) MCG/ACT IN AERS: 2.0000 | INHALATION_SPRAY | Freq: Four times a day (QID) | RESPIRATORY_TRACT | Status: DC | PRN

## 2018-04-29 MED ORDER — POLYETHYLENE GLYCOL 3350 17 G PO PACK
17.0000 g | PACK | Freq: Every day | ORAL | Status: DC
  Administered 2018-04-30 – 2018-05-10 (×9): 17 g via ORAL
  Filled 2018-04-29 (×10): qty 1

## 2018-04-29 MED ORDER — HYDROXYZINE HCL 25 MG PO TABS: 25.0000 mg | ORAL_TABLET | Freq: Three times a day (TID) | ORAL | Status: DC | PRN

## 2018-04-29 MED ORDER — ACETAMINOPHEN 325 MG PO TABS
650.0000 mg | ORAL_TABLET | Freq: Four times a day (QID) | ORAL | Status: DC | PRN
  Administered 2018-05-02: 650 mg via ORAL
  Filled 2018-04-29 (×2): qty 2

## 2018-04-29 MED ORDER — LITHIUM CARBONATE 300 MG PO CAPS
600.0000 mg | ORAL_CAPSULE | Freq: Every day | ORAL | Status: DC
  Administered 2018-04-29: 600 mg via ORAL
  Filled 2018-04-29: qty 2

## 2018-04-29 MED ORDER — ONDANSETRON HCL 4 MG/2ML IJ SOLN: 4.0000 mg | Freq: Four times a day (QID) | INTRAMUSCULAR | Status: DC | PRN

## 2018-04-29 MED ORDER — BENZTROPINE MESYLATE 0.5 MG PO TABS
0.5000 mg | ORAL_TABLET | Freq: Every day | ORAL | Status: DC
  Administered 2018-04-30 – 2018-05-10 (×9): 0.5 mg via ORAL
  Filled 2018-04-29 (×12): qty 1

## 2018-04-29 MED ORDER — ACETAMINOPHEN 650 MG RE SUPP: 650.0000 mg | Freq: Four times a day (QID) | RECTAL | Status: DC | PRN

## 2018-04-29 MED ORDER — POLYETHYLENE GLYCOL 3350 17 G PO PACK: 17.0000 g | PACK | Freq: Every day | ORAL | Status: DC | PRN

## 2018-04-29 NOTE — ED Triage Notes (Addendum)
Per [facility]- pt picked [facility] due  abd pain and nausea.  Pt reports swallowing 20- AA batteries x2-4 weeks. Pt [facility] at [facility] from [facility]. [facility] from [facility].  Pt IVC'd today due to SI.  Pt has hx of schzo effective.

## 2018-04-29 NOTE — Consult Note (Signed)
Referring Provider:  EDP Primary Care Physician:  Fleet Contras, MD Primary Gastroenterologist:  unassigned  Reason for Consultation:  foreign body ingestion  HPI: Lawrence Morgan is a 37 y.o. male with past medical history of schizoaffective disorder PTSD and personality disorder presented to the hospital with abdominal pain.patient has been ingesting batteries and screws for last 1-1/2 to 2 months in an attempt to kill himself.yesterday started noticing central abdominal pain which felt like pressure sensation without any nausea or vomiting. Denies diarrhea or constipation. Last bowel movement this morning. Denies any blood in the stool or black stool. He continues to have suicidal ideation.  Past Medical History:  Diagnosis Date  . Intellectual disability    mild  . Marijuana use   . Personality disorder (HCC)   . PTSD (post-traumatic stress disorder)   . 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 Schizoaffective disorder, bipolar type [facility])     Past Surgical History:  Procedure Laterality Date  . FRACTURE SURGERY      r ankle     Prior to Admission medications   Not on File    Scheduled Meds: Continuous Infusions: PRN Meds:.  Allergies as of 04/29/2018 - Review Complete 04/29/2018  Allergen Reaction Noted  . Haldol [haloperidol lactate]  02/02/2017  . Thorazine [chlorpromazine]  02/02/2017  . Shellfish allergy Itching and Rash 04/29/2018    No family history on file.  Social History   Socioeconomic History  . Marital status: Single    Spouse name: Not on file  . Number of children: Not on file  . Years of education: Not on file  . Highest education level: Not on file  Occupational History  . Not on file  Social Needs  . Financial resource strain: Not on file  . Food insecurity:    Worry: Not on file    Inability: Not on file  . Transportation needs:    Medical: Not on file    Non-medical: Not on file  Tobacco Use  . Smoking status: Current Every  Day Smoker    Types: Cigars  . Smokeless tobacco: Never Used  Substance and Sexual Activity  . Alcohol use: Not Currently  . Drug use: Yes    Types: Marijuana  . Sexual activity: Not on file  Lifestyle  . Physical activity:    Days per week: Not on file    Minutes per session: Not on file  . Stress: Not on file  Relationships  . Social connections:    Talks on phone: Not on file    Gets together: Not on file    Attends religious service: Not on file    Active member of club or organization: Not on file    Attends meetings of clubs or organizations: Not on file    Relationship status: Not on file  . Intimate partner violence:    Fear of current or ex partner: Not on file    Emotionally abused: Not on file    Physically abused: Not on file    Forced sexual activity: Not on file  Other Topics Concern  . Not on file  Social History Narrative  . Not on file    Review of Systems: Review of Systems  Constitutional: Negative for chills and fever.  HENT: Negative for hearing loss and tinnitus.   Eyes: Negative for blurred vision and double vision.  Respiratory: Negative for cough, hemoptysis and sputum production.   Cardiovascular: Negative for chest pain and palpitations.  Gastrointestinal: Positive for abdominal  pain. Negative for blood in stool, constipation, diarrhea, heartburn, melena, nausea and vomiting.  Genitourinary: Negative for dysuria and urgency.  Musculoskeletal: Negative for myalgias and neck pain.  Skin: Negative for itching and rash.  Neurological: Negative for seizures and loss of consciousness.  Endo/Heme/Allergies: Does not bruise/bleed easily.  Psychiatric/Behavioral: Positive for suicidal ideas. Negative for hallucinations.    Physical Exam: Vital signs: Vitals:   04/29/18 1231 04/29/18 1315  BP: 118/67 122/73  Pulse: 75 74  Resp: (!) 24 (!) 24  Temp: 98.1 F (36.7 C)   SpO2: 100% 100%     Physical Exam  Constitutional: He is oriented to  person, place, and time. He appears well-developed and well-nourished. No distress.  HENT:  Head: Normocephalic.  Mouth/Throat: Oropharynx is clear and moist. No oropharyngeal exudate.  Eyes: EOM are normal. No scleral icterus.  Neck: Normal range of motion. Neck supple.  Cardiovascular: Normal rate, regular rhythm and normal heart sounds.  Pulmonary/Chest: Effort normal and breath sounds normal. No respiratory distress.  Abdominal: Soft. Bowel sounds are normal. He exhibits no distension. There is no tenderness. There is no rebound and no guarding.  Mild central abdominal discomfort on palpation but no tenderness. No peritoneal signs  Musculoskeletal: Normal range of motion. He exhibits no edema.  Neurological: He is alert and oriented to person, place, and time.  Skin: Skin is warm. No erythema.  Psychiatric: His behavior is normal.  anxious  Vitals reviewed.   GI:  Lab Results: Recent Labs    04/29/18 1053  WBC 4.5  HGB 12.5*  HCT 41.7  PLT 222   BMET Recent Labs    04/29/18 1053  NA 138  K 4.0  CL 105  CO2 27  GLUCOSE 78  BUN 15  CREATININE 1.17  CALCIUM 9.5   LFT Recent Labs    04/29/18 1053  PROT 6.4*  ALBUMIN 3.8  AST 16  ALT 13  ALKPHOS 64  BILITOT 0.6   PT/INR No results for input(s): LABPROT, INR in the last 72 hours.   Studies/Results: Ct Abdomen Pelvis W Contrast  Result Date: 04/29/2018 CLINICAL DATA:  abd pain and nausea. Pt reports swallowing 20- AA batteries x2-4 weeks. [facility] at [facility] from [facility]. Pt came from [facility]. Pt IVC'd today due to SI. Pt has hx of schzo effective. EXAM: CT ABDOMEN AND PELVIS WITH CONTRAST TECHNIQUE: Multidetector CT imaging of the abdomen and pelvis was performed using the standard protocol following bolus administration of intravenous contrast. CONTRAST:  OMNIPAQUE IOHEXOL 300 MG/ML  SOLN COMPARISON:  Radiographs from earlier the same day FINDINGS: Lower chest: Trace pleural  effusions. No pericardial effusion. Minimal dependent atelectasis posteriorly at the left lung base. Hepatobiliary: No focal liver abnormality is seen. No gallstones, gallbladder wall thickening, or biliary dilatation. Pancreas: Unremarkable. No pancreatic ductal dilatation or surrounding inflammatory changes. Spleen: Normal in size without focal abnormality. Adrenals/Urinary Tract: Normal adrenals. Normal kidneys. Urinary bladder incompletely distended, unremarkable. Stomach/Bowel: Stomach is nondistended. There is a 3.7 cm cm oval smoothly marginated foreign body in the gastric antrum. Small bowel is decompressed. Moderate colonic fecal material. No dilatation or wall thickening. Multiple intraluminal foreign bodies. Probable AA battery in the cecum, clustered with an adjacent screw and nonspecific 2.2 cm smoothly marginated metallic foreign body. Probable AA battery in the hepatic flexure. Metallic screw and separate 2.2 cm coin shaped metallic density in the distal transverse colon. Probable AA battery in the mid descending colon. Vascular/Lymphatic: No significant vascular  findings are present. No enlarged abdominal or pelvic lymph nodes. Reproductive: Prostate is unremarkable. Other: No ascites.  Left pelvic phleboliths.  No free air. Musculoskeletal: No acute or significant osseous findings. IMPRESSION: 1. Multiple foreign bodies in the stomach and colon as detailed above, without evidence of perforation, obstruction, regional inflammatory change, or bowel wall thickening. Electronically Signed   By: Corlis Leak  Hassell M.D.   On: 04/29/2018 12:31   Dg Abdomen Acute W/chest  Result Date: 04/29/2018 CLINICAL DATA:  Foreign body ingestion EXAM: DG ABDOMEN ACUTE W/ 1V CHEST COMPARISON:  04/27/08 FINDINGS: No abnormally dilated loops of bowel identified. There are multiple metallic radiopaque foreign bodies identified within the abdomen. Within the right lower quadrant of the abdomen there are approximately 5 foreign  bodies including a screw, 2 batteries, and several discoid foreign bodies which may represent coins. In the left abdomen there are 3 metallic foreign bodies including a screw, a battery and presumed coin. Within the central abdomen there is a less dense, round foreign body which measures approximately 4.2 cm. Heart size appears normal. No pleural effusion. The lungs are clear. IMPRESSION: Multiple radiodense foreign bodies are identified in the distribution of the ascending colon, transverse colon and descending colon. Foreign bodies include several batteries, screws and possibly several coring. No evidence to suggest bowel obstruction. Electronically Signed   By: Signa Kellaylor  Stroud M.D.   On: 04/29/2018 11:36    Impression/Plan: - foreign body ingestions with CT scan showing multiple foreign bodies scattered throughout the colon including AA batteries, screws and coins as well as 3.7 cm oval smoothly marginated foreign body in the gastric antrum. He has been ingesting foreign bodies for several weeks now. - Suicidal ideations  Recommendations ------------------------ - plan for EGD tomorrow for removal of large foreign body from the stomach. - Check x-ray in the morning. Foreign bodies that are already in the colon will pass spontaneously. - Recommend inpatient admission as well as psychiatric evaluation for ongoing suicidal ideations. - Okay to have clear liquid diet today  - NPO  PM  - GI will follow    LOS: 0 days   Kathi DerParag   MD, FACP 04/29/2018, 2:21 PM  Contact #  [phone]

## 2018-04-29 NOTE — ED Notes (Signed)
Provided patient with jello and popsicle per MD okay.

## 2018-04-29 NOTE — ED Notes (Signed)
Patient transported to X-ray 

## 2018-04-29 NOTE — ED Notes (Addendum)
GI at bedside - states okay to have clear liquid diet. EDP aware.

## 2018-04-29 NOTE — Progress Notes (Signed)
Patient arrived to [id number] with safety sitter.  Alert and oriented, IV intact saline locked, self ambulated from wheelchair to bed.  Patient is calm and cooperative.  Will continue to monitor.

## 2018-04-29 NOTE — BH Assessment (Signed)
[facility] received a TTS consult, but pt is being medically admitted, so he will receive a psych consult on the medical unit. TTS order will be taken out.

## 2018-04-29 NOTE — ED Notes (Signed)
Parent is Lawrence Morgan, [phone]

## 2018-04-29 NOTE — ED Provider Notes (Signed)
[facility] EMERGENCY DEPARTMENT Provider Note   CSN: [id number] Arrival date & time: 04/29/18  1035     History   Chief Complaint Chief Complaint  Patient presents with  . Medical Clearance  . Suicidal    HPI Lawrence Morgan is a 38 y.o. male.  HPI   Lawrence Morgan is a 37 y.o. male, with a history of schizoaffective disorder, presenting to the ED with abdominal pain beginning around 1 AM this morning.  Pain is central and lower abdomen, feels like pressure, currently rated 6/10, radiating throughout the abdomen. Patient states he ingested a total of 20-30 AA batteries spaced out from around November 2 to November 16.  This was done in an attempt to kill himself.  He has since passed 6 batteries.  Denies chest pain, shortness of breath, fever, hematochezia/melena, vomiting, diarrhea, hematemesis, or any other complaints.    Past Medical History:  Diagnosis Date  . Intellectual disability    mild  . Marijuana use   . Personality disorder (HCC)   . PTSD (post-traumatic stress disorder)   . 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 Schizoaffective disorder, bipolar type [facility])     Patient Active Problem List   Diagnosis Date Noted  . Ingestion of foreign body, initial encounter 04/29/2018  . 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 Suicide ideation 04/29/2018  . Schizoaffective disorder, bipolar type (HCC)   . PTSD (post-traumatic stress disorder)     Past Surgical History:  Procedure Laterality Date  . FRACTURE SURGERY      r ankle         Home Medications    Prior to Admission medications   Medication Sig Start Date End Date Taking? Authorizing Provider  albuterol (PROVENTIL HFA;VENTOLIN HFA) 108 (90 Base) MCG/ACT inhaler Inhale 2 puffs into the lungs every 6 (six) hours as needed for wheezing or shortness of breath.   Yes [provider]  atomoxetine (STRATTERA) 25 MG capsule Take 25 mg by mouth daily.   Yes  [provider]  benztropine (COGENTIN) 0.5 MG tablet Take 0.5 mg by mouth daily.   Yes [provider]  cetirizine (ZYRTEC) 10 MG tablet Take 10 mg by mouth daily.   Yes [provider]  Cholecalciferol (VITAMIN D3) 50 MCG (2000 UT) capsule Take 2,000 Units by mouth daily.   Yes [provider]  cloZAPine (CLOZARIL) 100 MG tablet Take 200 mg by mouth at bedtime.   Yes [provider]  Docusate Sodium 100 MG capsule Take 200 mg by mouth 2 (two) times daily.   Yes [provider]  hydrOXYzine (ATARAX/VISTARIL) 25 MG tablet Take 25 mg by mouth 3 (three) times daily as needed for anxiety.   Yes [provider]  lithium carbonate 300 MG capsule Take 300 mg by mouth See admin instructions. Take 1 tablet (300 mg totally) by mouth in the morning; take 2 tablets (600 mg totally) by mouth at bed time   Yes [provider]  polyethylene glycol (MIRALAX / GLYCOLAX) packet Take 17 g by mouth daily.   Yes [provider]  sertraline (ZOLOFT) 100 MG tablet Take 100 mg by mouth daily.   Yes [provider]    Family History No family history on file.  Social History Social History   Tobacco Use  . Smoking status: Current Every Day Smoker    Types: Cigars  . Smokeless tobacco: Never Used  Substance Use Topics  . Alcohol use: Not Currently  .  Drug use: Yes    Types: Marijuana     Allergies   Haldol [haloperidol lactate]; Thorazine [chlorpromazine]; and Shellfish allergy   Review of Systems Review of Systems  Constitutional: Negative for chills, diaphoresis and fever.  Respiratory: Negative for cough and shortness of breath.   Cardiovascular: Negative for chest pain.  Gastrointestinal: Positive for abdominal pain. Negative for blood in stool, diarrhea, nausea and vomiting.  All other systems reviewed and are negative.    Physical Exam Updated Vital Signs BP 127/75   Pulse 87   Temp 98.5 F (36.9 C)    Resp 17   Ht 5\' 10"  (1.778 m)   Wt 79.4 kg   SpO2 100%   BMI 25.11 kg/m   Physical Exam  Constitutional: He appears well-developed and well-nourished. No distress.  HENT:  Head: Normocephalic and atraumatic.  Eyes: Conjunctivae are normal.  Neck: Neck supple.  Cardiovascular: Normal rate, regular rhythm, normal heart sounds and intact distal pulses.  Pulmonary/Chest: Effort normal and breath sounds normal. No respiratory distress.  Abdominal: Soft.    Musculoskeletal: He exhibits no edema.  Lymphadenopathy:    He has no cervical adenopathy.  Neurological: He is alert.  Skin: Skin is warm and dry. He is not diaphoretic.  Psychiatric: He has a normal mood and affect. His behavior is normal.  Nursing note and vitals reviewed.    ED Treatments / Results  Labs (all labs ordered are listed, but only abnormal results are displayed) Labs Reviewed  COMPREHENSIVE METABOLIC PANEL - Abnormal; Notable for the following components:      Result Value   Total Protein 6.4 (*)    All other components within normal limits  CBC WITH DIFFERENTIAL/PLATELET - Abnormal; Notable for the following components:   Hemoglobin 12.5 (*)    All other components within normal limits  ACETAMINOPHEN LEVEL - Abnormal; Notable for the following components:   Acetaminophen (Tylenol), Serum <10 (*)    All other components within normal limits  ETHANOL  RAPID URINE DRUG SCREEN, HOSP PERFORMED  SALICYLATE LEVEL  HIV ANTIBODY (ROUTINE TESTING W REFLEX)    EKG   ED ECG REPORT   Date: 04/30/2018  Rate: 88  Rhythm: normal sinus rhythm  QRS Axis: normal  Intervals: normal  ST/T Wave abnormalities: normal  Conduction Disutrbances:none  Narrative Interpretation:   Old EKG Reviewed: none available  I have personally reviewed the EKG tracing and agree with the computerized printout as noted.  Radiology Ct Abdomen Pelvis W Contrast  Result Date: 04/29/2018 CLINICAL DATA:  abd pain and nausea. Pt  reports swallowing 20- AA batteries x2-4 weeks. [facility] at [facility] from 11/[facility]. Pt came from [facility]. Pt IVC'd today due to SI. Pt has hx of schzo effective. EXAM: CT ABDOMEN AND PELVIS WITH CONTRAST TECHNIQUE: Multidetector CT imaging of the abdomen and pelvis was performed using the standard protocol following bolus administration of intravenous contrast. CONTRAST:  100mL OMNIPAQUE IOHEXOL 300 MG/ML  SOLN COMPARISON:  Radiographs from earlier the same day FINDINGS: Lower chest: Trace pleural effusions. No pericardial effusion. Minimal dependent atelectasis posteriorly at the left lung base. Hepatobiliary: No focal liver abnormality is seen. No gallstones, gallbladder wall thickening, or biliary dilatation. Pancreas: Unremarkable. No pancreatic ductal dilatation or surrounding inflammatory changes. Spleen: Normal in size without focal abnormality. Adrenals/Urinary Tract: Normal adrenals. Normal kidneys. Urinary bladder incompletely distended, unremarkable. Stomach/Bowel: Stomach is nondistended. There is a 3.7 cm cm oval smoothly marginated foreign body in the gastric antrum. Small  bowel is decompressed. Moderate colonic fecal material. No dilatation or wall thickening. Multiple intraluminal foreign bodies. Probable AA battery in the cecum, clustered with an adjacent screw and nonspecific 2.2 cm smoothly marginated metallic foreign body. Probable AA battery in the hepatic flexure. Metallic screw and separate 2.2 cm coin shaped metallic density in the distal transverse colon. Probable AA battery in the mid descending colon. Vascular/Lymphatic: No significant vascular findings are present. No enlarged abdominal or pelvic lymph nodes. Reproductive: Prostate is unremarkable. Other: No ascites.  Left pelvic phleboliths.  No free air. Musculoskeletal: No acute or significant osseous findings. IMPRESSION: 1. Multiple foreign bodies in the stomach and colon as detailed above, without evidence of  perforation, obstruction, regional inflammatory change, or bowel wall thickening. Electronically Signed   By: Corlis Leak M.D.   On: 04/29/2018 12:31   Dg Abdomen Acute W/chest  Result Date: 04/29/2018 CLINICAL DATA:  Foreign body ingestion EXAM: DG ABDOMEN ACUTE W/ 1V CHEST COMPARISON:  04/27/08 FINDINGS: No abnormally dilated loops of bowel identified. There are multiple metallic radiopaque foreign bodies identified within the abdomen. Within the right lower quadrant of the abdomen there are approximately 5 foreign bodies including a screw, 2 batteries, and several discoid foreign bodies which may represent coins. In the left abdomen there are 3 metallic foreign bodies including a screw, a battery and presumed coin. Within the central abdomen there is a less dense, round foreign body which measures approximately 4.2 cm. Heart size appears normal. No pleural effusion. The lungs are clear. IMPRESSION: Multiple radiodense foreign bodies are identified in the distribution of the ascending colon, transverse colon and descending colon. Foreign bodies include several batteries, screws and possibly several coring. No evidence to suggest bowel obstruction. Electronically Signed   By: Signa Kell M.D.   On: 04/29/2018 11:36    Procedures Procedures (including critical care time)  Medications Ordered in ED Medications  0.9 %  sodium chloride infusion ( Intravenous Rate/Dose Verify 04/30/18 0300)  atomoxetine (STRATTERA) capsule 25 mg (has no administration in time range)  cloZAPine (CLOZARIL) tablet 200 mg (200 mg Oral Given 04/29/18 2115)  hydrOXYzine (ATARAX/VISTARIL) tablet 25 mg (has no administration in time range)  lithium carbonate capsule 300 mg (has no administration in time range)  sertraline (ZOLOFT) tablet 100 mg (has no administration in time range)  polyethylene glycol (MIRALAX / GLYCOLAX) packet 17 g (has no administration in time range)  benztropine (COGENTIN) tablet 0.5 mg (has no  administration in time range)  loratadine (CLARITIN) tablet 10 mg (has no administration in time range)  acetaminophen (TYLENOL) tablet 650 mg (has no administration in time range)    Or  acetaminophen (TYLENOL) suppository 650 mg (has no administration in time range)  ondansetron (ZOFRAN) tablet 4 mg (has no administration in time range)    Or  ondansetron (ZOFRAN) injection 4 mg (has no administration in time range)  polyethylene glycol (MIRALAX / GLYCOLAX) packet 17 g (has no administration in time range)  lithium carbonate capsule 600 mg (600 mg Oral Given 04/29/18 2116)  albuterol (PROVENTIL) (2.5 MG/3ML) 0.083% nebulizer solution 2.5 mg (has no administration in time range)  iohexol (OMNIPAQUE) 300 MG/ML solution 100 mL (100 mLs Intravenous Contrast Given 04/29/18 1218)  zolpidem (AMBIEN) tablet 5 mg (5 mg Oral Given 04/30/18 0236)     Initial Impression / Assessment and Plan / ED Course  I have reviewed the triage vital signs and the nursing notes.  Pertinent labs & imaging results that were available during my  care of the patient were reviewed by me and considered in my medical decision making (see chart for details).  Clinical Course as of Apr 30 616  Sat Apr 29, 2018  1312 Spoke with Dr. Levora Angel, gastroenterologist. States he will come evaluate the patient.   [SJ]  1519 Spoke with Dr. Adrian Blackwater, hospitalist.  He will work on admitting the patient, but he does ask if, per GI, if patient needs to stay in the hospital until all objects pass.   [SJ]  [id number] Spoke with Dr. Levora Angel. Patient will likely stay until after his EGD tomorrow, and then be discharged.  He will have clear liquids tonight and then n.p.o. after midnight.   [SJ]  1621 Updated Dr. Adrian Blackwater.   [SJ]    Clinical Course User Index [SJ] ,  C, PA-C    Patient presents following ingestion of foreign bodies and abdominal pain. Patient is nontoxic appearing, afebrile, not tachycardic, not tachypneic, not  hypotensive, maintains excellent SPO2 on room air, and is in no apparent distress. Admitted for EGD in the morning as well as continued monitoring of his abdominal pain.   Findings and plan of care discussed with Benjiman Core, MD.   Final Clinical Impressions(s) / ED Diagnoses   Final diagnoses:  Swallowed foreign body, initial encounter    ED Discharge Orders    None       Concepcion Living 04/30/18 1610    Benjiman Core, MD 04/30/18 1521

## 2018-04-29 NOTE — H&P (Signed)
History and Physical  Lawrence FilaRodney Mestre JXB:[id number] DOB: 01/24/1981 DOA: 04/29/2018  Referring physician: Harolyn RutherfordShawn Joy, PA-C PCP: Fleet ContrasAvbuere, Edwin, MD  Outpatient Specialists:   Patient Coming From: [facility]  Chief Complaint: abdominal pain  HPI: Lawrence Morgan is a 37 y.o. male with a history of schizo affective disorder, bipolar disorder.  Patient seen for abdominal pain after ingesting multiple foreign objects earlier this week.  Patient has been at [facility] facility due to suicidal ideation and attempt by ingesting multiple foreign objects, including batteries and screws.  He was transported to the emergency department today after developing abdominal pain which is mostly in his right upper quadrant.  Pain is nonradiating.  It is improved now.  No other palliating or provoking factors.  Denies rectal bleeding, melena.  Emergency Department Course: Abdominal x-ray shows multiple foreign objects  Review of Systems:   Pt denies any fevers, chills, nausea, vomiting, diarrhea, constipation, abdominal pain, shortness of breath, dyspnea on exertion, orthopnea, cough, wheezing, palpitations, headache, vision changes, lightheadedness, dizziness, melena, rectal bleeding.  Review of systems are otherwise negative  Past Medical History:  Diagnosis Date  . Intellectual disability    mild  . Marijuana use   . Personality disorder (HCC)   . PTSD (post-traumatic stress disorder)   . 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 Schizoaffective disorder, bipolar type [facility](HCC)    Past Surgical History:  Procedure Laterality Date  . FRACTURE SURGERY      r ankle    Social History:  reports that he has been smoking cigars. He has never used smokeless tobacco. He reports that he drank alcohol. He reports that he has current or past drug history. Drug: Marijuana. Patient lives at home  Allergies  Allergen Reactions  . Haldol [Haloperidol Lactate]   . Thorazine [Chlorpromazine]   . Shellfish Allergy  Itching and Rash    No family history on file.  Unknown to patient  Prior to Admission medications   Medication Sig Start Date End Date Taking? Authorizing Provider  albuterol (PROVENTIL HFA;VENTOLIN HFA) 108 (90 Base) MCG/ACT inhaler Inhale 2 puffs into the lungs every 6 (six) hours as needed for wheezing or shortness of breath.   Yes [provider]  atomoxetine (STRATTERA) 25 MG capsule Take 25 mg by mouth daily.   Yes [provider]  benztropine (COGENTIN) 0.5 MG tablet Take 0.5 mg by mouth daily.   Yes [provider]  cetirizine (ZYRTEC) 10 MG tablet Take 10 mg by mouth daily.   Yes [provider]  Cholecalciferol (VITAMIN D3) 50 MCG (2000 UT) capsule Take 2,000 Units by mouth daily.   Yes [provider]  cloZAPine (CLOZARIL) 100 MG tablet Take 200 mg by mouth at bedtime.   Yes [provider]  Docusate Sodium 100 MG capsule Take 200 mg by mouth 2 (two) times daily.   Yes [provider]  hydrOXYzine (ATARAX/VISTARIL) 25 MG tablet Take 25 mg by mouth 3 (three) times daily as needed for anxiety.   Yes [provider]  lithium carbonate 300 MG capsule Take 300 mg by mouth See admin instructions. Take 1 tablet (300 mg totally) by mouth in the morning; take 2 tablets (600 mg totally) by mouth at bed time   Yes [provider]  polyethylene glycol (MIRALAX / GLYCOLAX) packet Take 17 g by mouth daily.   Yes [provider]  sertraline (ZOLOFT) 100 MG tablet Take 100 mg by mouth daily.   Yes [provider]    Physical  Exam: BP 122/73 (BP Location: Right Arm)   Pulse 74   Temp 98.1 F (36.7 C) (Oral)   Resp (!) 24   Ht 5\' 10"  (1.778 m)   Wt 79.4 kg   SpO2 100%   BMI 25.11 kg/m   . General: Young black male. Awake and alert and oriented x3. No acute cardiopulmonary distress.  Marland Kitchen HEENT: Normocephalic atraumatic.  Right and left ears normal in appearance.  Pupils equal, round, reactive to  light. Extraocular muscles are intact. Sclerae anicteric and noninjected.  Moist mucosal membranes. No mucosal lesions.  . Neck: Neck supple without lymphadenopathy. No carotid bruits. No masses palpated.  . Cardiovascular: Regular rate with normal S1-S2 sounds. No murmurs, rubs, gallops auscultated. No JVD.  Marland Kitchen Respiratory: Good respiratory effort with no wheezes, rales, rhonchi. Lungs clear to auscultation bilaterally.  No accessory muscle use. . Abdomen: Soft, mild tender in the right upper quadrant with no rebound or guarding. Nondistended. Active bowel sounds. No masses or hepatosplenomegaly  . Skin: No rashes, lesions, or ulcerations.  Dry, warm to touch. 2+ dorsalis pedis and radial pulses. . Musculoskeletal: No calf or leg pain. All major joints not erythematous nontender.  No upper or lower joint deformation.  Good ROM.  No contractures  . Psychiatric: Intact judgment and insight. Pleasant and cooperative. . Neurologic: No focal neurological deficits. Strength is 5/5 and symmetric in upper and lower extremities.  Cranial nerves II through XII are grossly intact.           Labs on Admission: I have personally reviewed following labs and imaging studies  CBC: Recent Labs  Lab 04/29/18 1053  WBC 4.5  NEUTROABS 2.8  HGB 12.5*  HCT 41.7  MCV 89.9  PLT 222   Basic Metabolic Panel: Recent Labs  Lab 04/29/18 1053  NA 138  K 4.0  CL 105  CO2 27  GLUCOSE 78  BUN 15  CREATININE 1.17  CALCIUM 9.5   GFR: Estimated Creatinine Clearance: 87.5 mL/min (by C-G formula based on SCr of 1.17 mg/dL). Liver Function Tests: Recent Labs  Lab 04/29/18 1053  AST 16  ALT 13  ALKPHOS 64  BILITOT 0.6  PROT 6.4*  ALBUMIN 3.8   No results for input(s): LIPASE, AMYLASE in the last 168 hours. No results for input(s): AMMONIA in the last 168 hours. Coagulation Profile: No results for input(s): INR, PROTIME in the last 168 hours. Cardiac Enzymes: No results for input(s): CKTOTAL, CKMB,  CKMBINDEX, TROPONINI in the last 168 hours. BNP (last 3 results) No results for input(s): PROBNP in the last 8760 hours. HbA1C: No results for input(s): HGBA1C in the last 72 hours. CBG: No results for input(s): GLUCAP in the last 168 hours. Lipid Profile: No results for input(s): CHOL, HDL, LDLCALC, TRIG, CHOLHDL, LDLDIRECT in the last 72 hours. Thyroid Function Tests: No results for input(s): TSH, T4TOTAL, FREET4, T3FREE, THYROIDAB in the last 72 hours. Anemia Panel: No results for input(s): VITAMINB12, FOLATE, FERRITIN, TIBC, IRON, RETICCTPCT in the last 72 hours. Urine analysis:    Component Value Date/Time   COLORURINE YELLOW 07/14/2008 0144   APPEARANCEUR CLOUDY (A) 07/14/2008 0144   LABSPEC 1.029 07/14/2008 0144   PHURINE 6.0 07/14/2008 0144   GLUCOSEU NEGATIVE 07/14/2008 0144   HGBUR NEGATIVE 07/14/2008 0144   BILIRUBINUR NEGATIVE 07/14/2008 0144   KETONESUR TRACE (A) 07/14/2008 0144   PROTEINUR NEGATIVE 07/14/2008 0144   UROBILINOGEN 1.0 07/14/2008 0144   NITRITE NEGATIVE 07/14/2008 0144   LEUKOCYTESUR  07/14/2008 0144  NEGATIVE MICROSCOPIC NOT DONE ON URINES WITH NEGATIVE PROTEIN, BLOOD, LEUKOCYTES, NITRITE, OR GLUCOSE <1000 mg/dL.   Sepsis Labs: @LABRCNTIP (procalcitonin:4,lacticidven:4) )No results found for this or any previous visit (from the past 240 hour(s)).   Radiological Exams on Admission: Ct Abdomen Pelvis W Contrast  Result Date: 04/29/2018 CLINICAL DATA:  abd pain and nausea. Pt reports swallowing 20- AA batteries x2-4 weeks. Pt was [facility] at [facility] from 11/[facility]. Pt came from [place] [facility]. Pt IVC'd today due to SI. Pt has hx of schzo effective. EXAM: CT ABDOMEN AND PELVIS WITH CONTRAST TECHNIQUE: Multidetector CT imaging of the abdomen and pelvis was performed using the standard protocol following bolus administration of intravenous contrast. CONTRAST:  OMNIPAQUE IOHEXOL 300 MG/ML  SOLN COMPARISON:  Radiographs from earlier the same  day FINDINGS: Lower chest: Trace pleural effusions. No pericardial effusion. Minimal dependent atelectasis posteriorly at the left lung base. Hepatobiliary: No focal liver abnormality is seen. No gallstones, gallbladder wall thickening, or biliary dilatation. Pancreas: Unremarkable. No pancreatic ductal dilatation or surrounding inflammatory changes. Spleen: Normal in size without focal abnormality. Adrenals/Urinary Tract: Normal adrenals. Normal kidneys. Urinary bladder incompletely distended, unremarkable. Stomach/Bowel: Stomach is nondistended. There is a 3.7 cm cm oval smoothly marginated foreign body in the gastric antrum. Small bowel is decompressed. Moderate colonic fecal material. No dilatation or wall thickening. Multiple intraluminal foreign bodies. Probable AA battery in the cecum, clustered with an adjacent screw and nonspecific 2.2 cm smoothly marginated metallic foreign body. Probable AA battery in the hepatic flexure. Metallic screw and separate 2.2 cm coin shaped metallic density in the distal transverse colon. Probable AA battery in the mid descending colon. Vascular/Lymphatic: No significant vascular findings are present. No enlarged abdominal or pelvic lymph nodes. Reproductive: Prostate is unremarkable. Other: No ascites.  Left pelvic phleboliths.  No free air. Musculoskeletal: No acute or significant osseous findings. IMPRESSION: 1. Multiple foreign bodies in the stomach and colon as detailed above, without evidence of perforation, obstruction, regional inflammatory change, or bowel wall thickening. Electronically Signed   By: Corlis Leak M.D.   On: 04/29/2018 12:31   Dg Abdomen Acute W/chest  Result Date: 04/29/2018 CLINICAL DATA:  Foreign body ingestion EXAM: DG ABDOMEN ACUTE W/ 1V CHEST COMPARISON:  04/27/08 FINDINGS: No abnormally dilated loops of bowel identified. There are multiple metallic radiopaque foreign bodies identified within the abdomen. Within the right lower quadrant of the  abdomen there are approximately 5 foreign bodies including a screw, 2 batteries, and several discoid foreign bodies which may represent coins. In the left abdomen there are 3 metallic foreign bodies including a screw, a battery and presumed coin. Within the central abdomen there is a less dense, round foreign body which measures approximately 4.2 cm. Heart size appears normal. No pleural effusion. The lungs are clear. IMPRESSION: Multiple radiodense foreign bodies are identified in the distribution of the ascending colon, transverse colon and descending colon. Foreign bodies include several batteries, screws and possibly several coring. No evidence to suggest bowel obstruction. Electronically Signed   By: Signa Kell M.D.   On: 04/29/2018 11:36     Assessment/Plan: Principal Problem:   Ingestion of foreign body, initial encounter Active Problems:   Schizoaffective disorder, bipolar type (HCC)   PTSD (post-traumatic stress disorder)   Schizo-affective schizophrenia (HCC)   Suicide ideation    This patient was discussed with the ED physician, including pertinent vitals, physical exam findings, labs, and imaging.  We also discussed care given by the ED provider.  1. Ingestion  of foreign body a. GI consulted b. N.p.o. after midnight for upper endoscopy c. Anticipate discharge tomorrow as patient not needed to remain inpatient for passing of objects in the colon 2. Suicide ideation a. Psych consulted b. One-to-one sitter c. Suicide precautions 3. Psych disorder a. Continue home medicines  DVT prophylaxis: SCD Consultants: GI Code Status: full Family Communication: none  Disposition Plan: pending   Levie Heritage, DO [facility] Pager [phone]  If 7PM-7AM, please contact night-coverage [email or web address] Password [email or web address]

## 2018-04-30 ENCOUNTER — Encounter (HOSPITAL_COMMUNITY): Payer: Self-pay | Admitting: *Deleted

## 2018-04-30 ENCOUNTER — Observation Stay (HOSPITAL_COMMUNITY): Payer: Medicare Other | Admitting: Certified Registered"

## 2018-04-30 ENCOUNTER — Encounter (HOSPITAL_COMMUNITY)
Admission: EM | Disposition: A | Discharge status: Home / Self Care | Payer: Self-pay | Source: Ambulatory Visit | Attending: Family Medicine

## 2018-04-30 DIAGNOSIS — T182XXA Foreign body in stomach, initial encounter: Secondary | ICD-10-CM | POA: Diagnosis not present

## 2018-04-30 DIAGNOSIS — K228 Other specified diseases of esophagus: Secondary | ICD-10-CM | POA: Diagnosis not present

## 2018-04-30 DIAGNOSIS — T189XXA Foreign body of alimentary tract, part unspecified, initial encounter: Secondary | ICD-10-CM

## 2018-04-30 DIAGNOSIS — Z751 Person awaiting admission to adequate facility elsewhere: Secondary | ICD-10-CM | POA: Diagnosis not present

## 2018-04-30 DIAGNOSIS — R45851 Suicidal ideations: Secondary | ICD-10-CM | POA: Diagnosis not present

## 2018-04-30 DIAGNOSIS — F250 Schizoaffective disorder, bipolar type: Secondary | ICD-10-CM

## 2018-04-30 DIAGNOSIS — F4310 Post-traumatic stress disorder, unspecified: Secondary | ICD-10-CM

## 2018-04-30 DIAGNOSIS — Z79899 Other long term (current) drug therapy: Secondary | ICD-10-CM | POA: Diagnosis not present

## 2018-04-30 DIAGNOSIS — X838XXA Intentional self-harm by other specified means, initial encounter: Secondary | ICD-10-CM | POA: Diagnosis not present

## 2018-04-30 DIAGNOSIS — F632 Kleptomania: Secondary | ICD-10-CM | POA: Diagnosis not present

## 2018-04-30 DIAGNOSIS — F70 Mild intellectual disabilities: Secondary | ICD-10-CM | POA: Diagnosis not present

## 2018-04-30 DIAGNOSIS — T189XXD Foreign body of alimentary tract, part unspecified, subsequent encounter: Secondary | ICD-10-CM | POA: Diagnosis not present

## 2018-04-30 DIAGNOSIS — F17290 Nicotine dependence, other tobacco product, uncomplicated: Secondary | ICD-10-CM | POA: Diagnosis not present

## 2018-04-30 DIAGNOSIS — K449 Diaphragmatic hernia without obstruction or gangrene: Secondary | ICD-10-CM | POA: Diagnosis not present

## 2018-04-30 DIAGNOSIS — Z888 Allergy status to other drugs, medicaments and biological substances status: Secondary | ICD-10-CM | POA: Diagnosis not present

## 2018-04-30 DIAGNOSIS — F609 Personality disorder, unspecified: Secondary | ICD-10-CM | POA: Diagnosis not present

## 2018-04-30 HISTORY — PX: ESOPHAGOGASTRODUODENOSCOPY (EGD) WITH PROPOFOL: SHX5813

## 2018-04-30 HISTORY — PX: FOREIGN BODY REMOVAL: SHX962

## 2018-04-30 LAB — HIV ANTIBODY (ROUTINE TESTING W REFLEX): HIV Screen 4th Generation wRfx: NONREACTIVE

## 2018-04-30 SURGERY — ESOPHAGOGASTRODUODENOSCOPY (EGD) WITH PROPOFOL
Anesthesia: General

## 2018-04-30 MED ORDER — PROPOFOL 10 MG/ML IV BOLUS
INTRAVENOUS | Status: DC | PRN
  Administered 2018-04-30: 150 mg via INTRAVENOUS

## 2018-04-30 MED ORDER — PROPOFOL 500 MG/50ML IV EMUL
INTRAVENOUS | Status: DC | PRN
  Administered 2018-04-30: 125 ug/kg/min via INTRAVENOUS

## 2018-04-30 MED ORDER — GLYCOPYRROLATE PF 0.2 MG/ML IJ SOSY
PREFILLED_SYRINGE | INTRAMUSCULAR | Status: DC | PRN
  Administered 2018-04-30: .2 mg via INTRAVENOUS

## 2018-04-30 MED ORDER — SUCCINYLCHOLINE CHLORIDE 20 MG/ML IJ SOLN
INTRAMUSCULAR | Status: DC | PRN
  Administered 2018-04-30: 140 mg via INTRAVENOUS

## 2018-04-30 MED ORDER — LITHIUM CARBONATE ER 450 MG PO TBCR
450.0000 mg | EXTENDED_RELEASE_TABLET | Freq: Two times a day (BID) | ORAL | Status: DC
  Administered 2018-04-30 – 2018-05-10 (×20): 450 mg via ORAL
  Filled 2018-04-30 (×21): qty 1

## 2018-04-30 MED ORDER — LACTATED RINGERS IV SOLN
INTRAVENOUS | Status: DC | PRN
  Administered 2018-04-30 (×2): via INTRAVENOUS

## 2018-04-30 MED ORDER — BUTAMBEN-TETRACAINE-BENZOCAINE 2-2-14 % EX AERO
INHALATION_SPRAY | CUTANEOUS | Status: DC | PRN
  Administered 2018-04-30: 2 via TOPICAL

## 2018-04-30 MED ORDER — FENTANYL CITRATE (PF) 100 MCG/2ML IJ SOLN
INTRAMUSCULAR | Status: DC | PRN
  Administered 2018-04-30 (×2): 50 ug via INTRAVENOUS

## 2018-04-30 MED ORDER — ZOLPIDEM TARTRATE 5 MG PO TABS
5.0000 mg | ORAL_TABLET | Freq: Once | ORAL | Status: AC
  Administered 2018-04-30: 5 mg via ORAL
  Filled 2018-04-30: qty 1

## 2018-04-30 SURGICAL SUPPLY — 15 items

## 2018-04-30 NOTE — Anesthesia Preprocedure Evaluation (Signed)
Anesthesia Evaluation  Patient identified by MRN, date of birth, ID band Patient awake    Reviewed: Allergy & Precautions, H&P , NPO status , Patient's Chart, lab work & pertinent test results  Airway Mallampati: II   Neck ROM: full    Dental   Pulmonary Current Smoker,    breath sounds clear to auscultation       Cardiovascular negative cardio ROS   Rhythm:regular Rate:Normal     Neuro/Psych PSYCHIATRIC DISORDERS Anxiety Bipolar Disorder Schizophrenia PTSD   GI/Hepatic (+)     substance abuse  marijuana use,   Endo/Other    Renal/GU      Musculoskeletal   Abdominal   Peds  Hematology   Anesthesia Other Findings   Reproductive/Obstetrics                             Anesthesia Physical Anesthesia Plan  ASA: II  Anesthesia Plan: MAC   Post-op Pain Management:    Induction: Intravenous  PONV Risk Score and Plan: 0 and Ondansetron, Propofol infusion and Treatment may vary due to age or medical condition  Airway Management Planned: Nasal Cannula  Additional Equipment:   Intra-op Plan:   Post-operative Plan:   Informed Consent: I have reviewed the patients History and Physical, chart, labs and discussed the procedure including the risks, benefits and alternatives for the proposed anesthesia with the patient or authorized representative who has indicated his/her understanding and acceptance.     Plan Discussed with: CRNA, Anesthesiologist and Surgeon  Anesthesia Plan Comments:         Anesthesia Quick Evaluation

## 2018-04-30 NOTE — Consult Note (Signed)
[facility] Face-to-Face Psychiatry Consult   Reason for Consult:  ''suicide attempt by ingesting foreign objects.'' Referring Physician:  Dr. Butler Denmarkizwan Patient Identification: Lawrence Morgan MRN:  [id number] Principal Diagnosis: Schizoaffective disorder, bipolar type (HCC) Diagnosis:  Principal Problem:   Schizoaffective disorder, bipolar type (HCC) Active Problems:   Ingestion of foreign body, initial encounter   PTSD (post-traumatic stress disorder)   Schizo-affective schizophrenia (HCC)   Suicide ideation   Total Time spent with patient: 45 minutes  Subjective:   Lawrence FilaRodney Herrman is a 37 y.o. male patient admitted due to abdominal pain after ingesting multiple foreign objects.  HPI: Patient who reports long history of Schizoaffective disorder-Bipolar type, PTSD and Schizophrenia. He was brought to the hospital  with complaints of abdominal pain following 6-8 weeks of multiple  foreign objects ingestion. Patient reports that he has been hearing voices making derogatory comments about him and telling him to kill himself by swallowing rocks, nails, screws, pins and stones. He reports that the voices was so strong he did ingested 7-8 rocks, 20-30 AA batteries and other items. He states that [facility] who [facility]. [place] lives in [place] in [place] but reports history of multiple previous long term inpatient psychiatric hospitalizations. He continues to verbalize suicidal thoughts with plan to ingest more foreign objects. But denies delusions or homicidal thoughts.  Past Psychiatric History: as above  Risk to Self:  yes Risk to Others:  denies Prior Inpatient Therapy:  yes Prior Outpatient Therapy:  [facility]  Past Medical History:  Past Medical History:  Diagnosis Date  . Intellectual disability    mild  . Marijuana use   . Personality disorder (HCC)   . PTSD (post-traumatic stress disorder)   . Schizo-affective  schizophrenia (HCC) 04/29/2018  . Schizoaffective disorder, bipolar type [facility](HCC)     Past Surgical History:  Procedure Laterality Date  . FRACTURE SURGERY      r ankle    Family History: History reviewed. No pertinent family history. Family Psychiatric  History:  Social History:  Social History   Substance and Sexual Activity  Alcohol Use Not Currently     Social History   Substance and Sexual Activity  Drug Use Yes  . Types: Marijuana    Social History   Socioeconomic History  . Marital status: Single    Spouse name: Not on file  . Number of children: Not on file  . Years of education: Not on file  . Highest education level: Not on file  Occupational History  . Not on file  Social Needs  . Financial resource strain: Not on file  . Food insecurity:    Worry: Not on file    Inability: Not on file  . Transportation needs:    Medical: Not on file    Non-medical: Not on file  Tobacco Use  . Smoking status: Current Every Day Smoker    Types: Cigars  . Smokeless tobacco: Never Used  Substance and Sexual Activity  . Alcohol use: Not Currently  . Drug use: Yes    Types: Marijuana  . Sexual activity: Not on file  Lifestyle  . Physical activity:    Days per week: Not on file    Minutes per session: Not on file  . Stress: Not on file  Relationships  . Social connections:    Talks on phone: Not on file    Gets together: Not on file    Attends  religious service: Not on file    Active member of club or organization: Not on file    Attends meetings of clubs or organizations: Not on file    Relationship status: Not on file  Other Topics Concern  . Not on file  Social History Narrative  . Not on file   Additional Social History:    Allergies:   Allergies  Allergen Reactions  . Haldol [Haloperidol Lactate]   . Thorazine [Chlorpromazine]   . Shellfish Allergy Itching and Rash    Labs:  Results for orders placed or performed during the hospital encounter of  04/29/18 (from the past 48 hour(s))  Comprehensive metabolic panel     Status: Abnormal   Collection Time: 04/29/18 10:53 AM  Result Value Ref Range   Sodium 138 135 - 145 mmol/L   Potassium 4.0 3.5 - 5.1 mmol/L   Chloride 105 98 - 111 mmol/L   CO2 27 22 - 32 mmol/L   Glucose, Bld 78 70 - 99 mg/dL   BUN 15 6 - 20 mg/dL   Creatinine, Ser 1.61 0.61 - 1.24 mg/dL   Calcium 9.5 8.9 - 09.6 mg/dL   Total Protein 6.4 (L) 6.5 - 8.1 g/dL   Albumin 3.8 3.5 - 5.0 g/dL   AST 16 15 - 41 U/L   ALT 13 0 - 44 U/L   Alkaline Phosphatase 64 38 - 126 U/L   Total Bilirubin 0.6 0.3 - 1.2 mg/dL   GFR calc non Af Amer >60 >60 mL/min   GFR calc Af Amer >60 >60 mL/min   Anion gap 6 5 - 15    Comment: Performed at [facility], [address] [place], [place] [address]  CBC with Differential     Status: Abnormal   Collection Time: 04/29/18 10:53 AM  Result Value Ref Range   WBC 4.5 4.0 - 10.5 K/uL   RBC 4.64 4.22 - 5.81 MIL/uL   Hemoglobin 12.5 (L) 13.0 - 17.0 g/dL   HCT 98.1 19.1 - 47.8 %   MCV 89.9 80.0 - 100.0 fL   MCH 26.9 26.0 - 34.0 pg   MCHC 30.0 30.0 - 36.0 g/dL   RDW 29.5 62.1 - 30.8 %   Platelets 222 150 - 400 K/uL   nRBC 0.0 0.0 - 0.2 %   Neutrophils Relative % 61 %   Neutro Abs 2.8 1.7 - 7.7 K/uL   Lymphocytes Relative 25 %   Lymphs Abs 1.1 0.7 - 4.0 K/uL   Monocytes Relative 12 %   Monocytes Absolute 0.5 0.1 - 1.0 K/uL   Eosinophils Relative 1 %   Eosinophils Absolute 0.0 0.0 - 0.5 K/uL   Basophils Relative 1 %   Basophils Absolute 0.0 0.0 - 0.1 K/uL   Immature Granulocytes 0 %   Abs Immature Granulocytes 0.01 0.00 - 0.07 K/uL    Comment: Performed at [facility], [address], [place], [place] [address]  Ethanol     Status: None   Collection Time: 04/29/18 10:53 AM  Result Value Ref Range   Alcohol, Ethyl (B) <10 <10 mg/dL    Comment: (NOTE) Lowest detectable limit for serum alcohol is 10 mg/dL. For medical purposes only. Performed at [facility],  [address], [place], [place] [address]   Urine rapid drug screen (hosp performed)     Status: None   Collection Time: 04/29/18 10:53 AM  Result Value Ref Range   Opiates NONE DETECTED NONE DETECTED  Cocaine NONE DETECTED NONE DETECTED   Benzodiazepines NONE DETECTED NONE DETECTED   Amphetamines NONE DETECTED NONE DETECTED   Tetrahydrocannabinol NONE DETECTED NONE DETECTED   Barbiturates NONE DETECTED NONE DETECTED    Comment: (NOTE) DRUG SCREEN FOR MEDICAL PURPOSES ONLY.  IF CONFIRMATION IS NEEDED FOR ANY PURPOSE, NOTIFY LAB WITHIN 5 DAYS. LOWEST DETECTABLE LIMITS FOR URINE DRUG SCREEN Drug Class                     Cutoff (ng/mL) Amphetamine and metabolites    1000 Barbiturate and metabolites    200 Benzodiazepine                 200 Tricyclics and metabolites     300 Opiates and metabolites        300 Cocaine and metabolites        300 THC                            50 Performed at [facility], [address], [place], [place] [address]   Acetaminophen level     Status: Abnormal   Collection Time: 04/29/18 10:53 AM  Result Value Ref Range   Acetaminophen (Tylenol), Serum <10 (L) 10 - 30 ug/mL    Comment: (NOTE) Therapeutic concentrations vary significantly. A range of 10-30 ug/mL  may be an effective concentration for many patients. However, some  are best treated at concentrations outside of this range. Acetaminophen concentrations >150 ug/mL at 4 hours after ingestion  and >50 ug/mL at 12 hours after ingestion are often associated with  toxic reactions. Performed at [facility], [address] [place], [place] [address]   Salicylate level     Status: None   Collection Time: 04/29/18 10:53 AM  Result Value Ref Range   Salicylate Lvl <7.0 2.8 - 30.0 mg/dL    Comment: Performed at [facility], [address], [place], [place] [address]    Current Facility-Administered Medications  Medication Dose Route Frequency Provider Last Rate Last Dose   . acetaminophen (TYLENOL) tablet 650 mg  650 mg Oral Q6H PRN Levie Heritage, DO       Or  . acetaminophen (TYLENOL) suppository 650 mg  650 mg Rectal Q6H PRN Levie Heritage, DO      . albuterol (PROVENTIL) (2.5 MG/3ML) 0.083% nebulizer solution 2.5 mg  2.5 mg Nebulization Q6H PRN Levie Heritage, DO      . atomoxetine (STRATTERA) capsule 25 mg  25 mg Oral Daily Levie Heritage, DO   25 mg at 04/30/18 1431  . benztropine (COGENTIN) tablet 0.5 mg  0.5 mg Oral Daily Levie Heritage, DO   0.5 mg at 04/30/18 1431  . cloZAPine (CLOZARIL) tablet 200 mg  200 mg Oral QHS Levie Heritage, DO   200 mg at 04/29/18 2115  . hydrOXYzine (ATARAX/VISTARIL) tablet 25 mg  25 mg Oral TID PRN Levie Heritage, DO      . lithium carbonate (ESKALITH) CR tablet 450 mg  450 mg Oral Q12H , , MD      . loratadine (CLARITIN) tablet 10 mg  10 mg Oral Daily Levie Heritage, DO   10 mg at 04/30/18 1431  . ondansetron (ZOFRAN) tablet 4 mg  4 mg Oral Q6H PRN Levie Heritage, DO       Or  . ondansetron [facility]) injection 4 mg  4  mg Intravenous Q6H PRN Levie Heritage, DO      . polyethylene glycol (MIRALAX / GLYCOLAX) packet 17 g  17 g Oral Daily Levie Heritage, DO   17 g at 04/30/18 1432  . polyethylene glycol (MIRALAX / GLYCOLAX) packet 17 g  17 g Oral Daily PRN Levie Heritage, DO      . sertraline (ZOLOFT) tablet 100 mg  100 mg Oral Daily Levie Heritage, DO   100 mg at 04/30/18 1431    Musculoskeletal: Strength & Muscle Tone: within normal limits Gait & Station: normal Patient leans: N/A  Psychiatric Specialty Exam: Physical Exam  Psychiatric: His speech is normal. He is actively hallucinating. Cognition and memory are normal. He expresses impulsivity. He exhibits a depressed mood. He expresses suicidal ideation. He expresses suicidal plans.    Review of Systems  HENT: Negative.   Eyes: Negative.   Respiratory: Negative.   Cardiovascular: Negative.   Gastrointestinal: Positive for  abdominal pain.  Genitourinary: Negative.   Musculoskeletal: Negative.   Skin: Negative.   Neurological: Negative.   Endo/Heme/Allergies: Negative.   Psychiatric/Behavioral: Positive for hallucinations and suicidal ideas.    Blood pressure 131/86, pulse 66, temperature 98.7 F (37.1 C), resp. rate 19, height 5\' 10"  (1.778 m), weight 79.4 kg, SpO2 100 %.Body mass index is 25.11 kg/m.  General Appearance: Casual  Eye Contact:  Good  Speech:  Clear and Coherent  Volume:  Normal  Mood:  Dysphoric  Affect:  Constricted  Thought Process:  Coherent  Orientation:  Full (Time, Place, and Person)  Thought Content:  Hallucinations: Auditory  Suicidal Thoughts:  Yes.  with intent/plan  Homicidal Thoughts:  No  Memory:  Immediate;   Fair Recent;   Fair Remote;   Fair  Judgement:  Poor  Insight:  Lacking  Psychomotor Activity:  Psychomotor Retardation  Concentration:  Concentration: Fair and Attention Span: Fair  Recall:  [employer] of Knowledge:  Fair  Language:  Good  Akathisia:  No  Handed:  Right  AIMS (if indicated):     Assets:  Communication Skills Desire for Improvement  ADL's:  Intact  Cognition:  WNL  Sleep:   fair     Treatment Plan Summary:  37 y.o. male with long history of mental illness who was admitted due to command auditory hallucination and ingestion of multiple foreign objects in a suicide attempt.  Recommendations: -Continue 1:1 sitter for safety -Change Lithium carbonate to Lithobid CR 450 mg bid due to recurrent suicidal thoughts. -Continue Clozaril 200 mg at bedtime for schizoaffective disorder. -Continue Zoloft 100 mg daily for PTSD -Continue Benztropine and Atomoxetine. -Consider social worker consult to facilitate inpatient psychiatric placement. -Patient will benefit from inpatient psychiatric admission for stabilization. Consider IVC patient if he refuses voluntary admission. -Psychiatric service signing out. Re-consult psych service as  needed.   Disposition: Recommend psychiatric Inpatient admission when medically cleared.  Thedore Mins, MD 04/30/2018 3:35 PM

## 2018-04-30 NOTE — Anesthesia Procedure Notes (Signed)
Procedure Name: Intubation Date/Time: 04/30/2018 11:17 AM Performed by: Sammie Bench, CRNA Pre-anesthesia Checklist: Patient identified, Emergency Drugs available, Suction available and Patient being monitored Patient Re-evaluated:Patient Re-evaluated prior to induction Oxygen Delivery Method: Circle System Utilized Preoxygenation: Pre-oxygenation with 100% oxygen Induction Type: IV induction Ventilation: Mask ventilation without difficulty Laryngoscope Size: Mac and 4 Grade View: Grade III Tube type: Oral Tube size: 8.0 mm Number of attempts: 1 Airway Equipment and Method: Stylet and Oral airway Placement Confirmation: ETT inserted through vocal cords under direct vision,  positive ETCO2 and breath sounds checked- equal and bilateral Secured at: 24 cm Tube secured with: Tape Dental Injury: Teeth and Oropharynx as per pre-operative assessment

## 2018-04-30 NOTE — Brief Op Note (Signed)
04/29/2018 - 04/30/2018  1:48 PM  PATIENT:  Lawrence Morgan  37 y.o. male  PRE-OPERATIVE DIAGNOSIS:  foreign body ingestion  POST-OPERATIVE DIAGNOSIS:  unable to remove stone  PROCEDURE:  Procedure(s): ESOPHAGOGASTRODUODENOSCOPY (EGD) WITH PROPOFOL (N/A) FOREIGN BODY REMOVAL (N/A)  SURGEON:  Surgeon(s) and Role:    * , , MD - Primary  Findings ---------- - Large landscape stone was found in the fundus.multiple attempts to remove with different methods failed as we were not able to grasp the stone because of size as well as smooth surface. Please see procedure note for detail.  Recommendations ------------------------ - Discuss with Dr. Ewing SchleinMagod as well as Dr. Meridee ScoreMansouraty. Possible option of lithotripsy discussed but patient declined. - Patient prefers surgical management rather than attempting another endoscopy. - I have discussed with Dr. Cliffton AstersWhite.  - GI will follow.  Kathi Der  MD, FACP 04/30/2018, 1:52 PM  Contact #  [phone]

## 2018-04-30 NOTE — Progress Notes (Signed)
Pt. Reported difficulty sleeping and requested medication.  Paged MD and received an order for a one time dose of Ambien.

## 2018-04-30 NOTE — Plan of Care (Signed)
  Problem: Education: Goal: Knowledge of General Education information will improve Description Including pain rating scale, medication(s)/side effects and non-pharmacologic comfort measures Outcome: Progressing   Problem: Clinical Measurements: Goal: Ability to maintain clinical measurements within normal limits will improve Outcome: Progressing Goal: Will remain free from infection Outcome: Progressing   Problem: Activity: Goal: Risk for activity intolerance will decrease Outcome: Progressing   Problem: Nutrition: Goal: Adequate nutrition will be maintained Outcome: Progressing   Problem: Coping: Goal: Level of anxiety will decrease Outcome: Progressing   Problem: Elimination: Goal: Will not experience complications related to bowel motility Outcome: Progressing Goal: Will not experience complications related to urinary retention Outcome: Progressing   Problem: Pain Managment: Goal: General experience of comfort will improve Outcome: Progressing   Problem: Safety: Goal: Ability to remain free from injury will improve Outcome: Progressing   Problem: Skin Integrity: Goal: Risk for impaired skin integrity will decrease Outcome: Progressing   

## 2018-04-30 NOTE — Transfer of Care (Signed)
Immediate Anesthesia Transfer of Care Note  Patient: Lawrence Morgan  Procedure(s) Performed: ESOPHAGOGASTRODUODENOSCOPY (EGD) WITH PROPOFOL (N/A ) FOREIGN BODY REMOVAL (N/A )  Patient Location: PACU  Anesthesia Type:General  Level of Consciousness: awake, alert , oriented and patient cooperative  Airway & Oxygen Therapy: Patient Spontanous Breathing and Patient connected to nasal cannula oxygen  Post-op Assessment: Report given to RN and Post -op Vital signs reviewed and stable  Post vital signs: Reviewed and stable  Last Vitals:  Vitals Value Taken Time  BP 126/56 04/30/2018 12:13 PM  Temp    Pulse 85 04/30/2018 12:14 PM  Resp 28 04/30/2018 12:14 PM  SpO2 99 % 04/30/2018 12:14 PM  Vitals shown include unvalidated device data.  Last Pain:  Vitals:   04/30/18 0903  TempSrc: Oral  PainSc: 0-No pain         Complications: No apparent anesthesia complications

## 2018-04-30 NOTE — Consult Note (Signed)
CC: Consult by Dr. Levora Angel for endoscopically un-retrievable landscape stone in the stomach  HPI: Lawrence Morgan is an 37 y.o. male with hx of schizoaffective disorder whom presented to the hospital with complaints of abdominal pain and foreign body ingestion. He tells me that over the last 6-8 weeks he has been ingesting screws and a large volume of AA batteries and a landscape stone. His last ingestion was approximately 2 weeks ago by his report. He reports this was all in attempt to commit suicide.  On presentation he was AF with normal vitals WBC 4.5  He underwent CT A/P 11/30 which demonstrated multiple foreign bodies in stomach and colon without evidence of perforation, obstruction, regional inflammatory change or bowel wall thickening.  He underwent EGD today with Dr. Levora Angel which was notable for a 3.7cm landscape rock in the fundus. Despite multiple attempts to remove with roth nets, etc and double channel scopes, he was unable to remove it. It had smooth edges so could not be grasped. Dr. Meridee Score was also present. They did not feel there were any other endoscopic maneuvers that would be successful. We were consulted  He denies any abdominal pain today. He denies any hx of obstructive sxs related to this  Past Medical History:  Diagnosis Date  . Intellectual disability    mild  . Marijuana use   . Personality disorder (HCC)   . PTSD (post-traumatic stress disorder)   . 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 Schizoaffective disorder, bipolar type [facility]     Past Surgical History:  Procedure Laterality Date  . FRACTURE SURGERY      r ankle     History reviewed. No pertinent family history.  Social:  reports that he has been smoking cigars. He has never used smokeless tobacco. He reports that he drank alcohol. He reports that he has current or past drug history. Drug: Marijuana.  Allergies:  Allergies  Allergen Reactions  . Haldol [Haloperidol Lactate]    . Thorazine [Chlorpromazine]   . Shellfish Allergy Itching and Rash    Medications: I have reviewed the patient's current medications.  Results for orders placed or performed during the hospital encounter of 04/29/18 (from the past 48 hour(s))  Comprehensive metabolic panel     Status: Abnormal   Collection Time: 04/29/18 10:53 AM  Result Value Ref Range   Sodium 138 135 - 145 mmol/L   Potassium 4.0 3.5 - 5.1 mmol/L   Chloride 105 98 - 111 mmol/L   CO2 27 22 - 32 mmol/L   Glucose, Bld 78 70 - 99 mg/dL   BUN 15 6 - 20 mg/dL   Creatinine, Ser 7.82 0.61 - 1.24 mg/dL   Calcium 9.5 8.9 - 95.6 mg/dL   Total Protein 6.4 (L) 6.5 - 8.1 g/dL   Albumin 3.8 3.5 - 5.0 g/dL   AST 16 15 - 41 U/L   ALT 13 0 - 44 U/L   Alkaline Phosphatase 64 38 - 126 U/L   Total Bilirubin 0.6 0.3 - 1.2 mg/dL   GFR calc non Af Amer >60 >60 mL/min   GFR calc Af Amer >60 >60 mL/min   Anion gap 6 5 - 15    Comment: Performed at [facility], [address] [place], [place] [address]  CBC with Differential     Status: Abnormal   Collection Time: 04/29/18 10:53 AM  Result Value Ref Range   WBC 4.5 4.0 - 10.5 K/uL   RBC 4.64 4.22 - 5.81 MIL/uL  Hemoglobin 12.5 (L) 13.0 - 17.0 g/dL   HCT 09.8 11.9 - 14.7 %   MCV 89.9 80.0 - 100.0 fL   MCH 26.9 26.0 - 34.0 pg   MCHC 30.0 30.0 - 36.0 g/dL   RDW 82.9 56.2 - 13.0 %   Platelets 222 150 - 400 K/uL   nRBC 0.0 0.0 - 0.2 %   Neutrophils Relative % 61 %   Neutro Abs 2.8 1.7 - 7.7 K/uL   Lymphocytes Relative 25 %   Lymphs Abs 1.1 0.7 - 4.0 K/uL   Monocytes Relative 12 %   Monocytes Absolute 0.5 0.1 - 1.0 K/uL   Eosinophils Relative 1 %   Eosinophils Absolute 0.0 0.0 - 0.5 K/uL   Basophils Relative 1 %   Basophils Absolute 0.0 0.0 - 0.1 K/uL   Immature Granulocytes 0 %   Abs Immature Granulocytes 0.01 0.00 - 0.07 K/uL    Comment: Performed at [facility], [address], [place], [place] [address]  Ethanol     Status: None   Collection Time:  04/29/18 10:53 AM  Result Value Ref Range   Alcohol, Ethyl (B) <10 <10 mg/dL    Comment: (NOTE) Lowest detectable limit for serum alcohol is 10 mg/dL. For medical purposes only. Performed at [facility], [address], [place], [place] [address]   Urine rapid drug screen (hosp performed)     Status: None   Collection Time: 04/29/18 10:53 AM  Result Value Ref Range   Opiates NONE DETECTED NONE DETECTED   Cocaine NONE DETECTED NONE DETECTED   Benzodiazepines NONE DETECTED NONE DETECTED   Amphetamines NONE DETECTED NONE DETECTED   Tetrahydrocannabinol NONE DETECTED NONE DETECTED   Barbiturates NONE DETECTED NONE DETECTED    Comment: (NOTE) DRUG SCREEN FOR MEDICAL PURPOSES ONLY.  IF CONFIRMATION IS NEEDED FOR ANY PURPOSE, NOTIFY LAB WITHIN 5 DAYS. LOWEST DETECTABLE LIMITS FOR URINE DRUG SCREEN Drug Class                     Cutoff (ng/mL) Amphetamine and metabolites    1000 Barbiturate and metabolites    200 Benzodiazepine                 200 Tricyclics and metabolites     300 Opiates and metabolites        300 Cocaine and metabolites        300 THC                            50 Performed at [facility], [address] [place], [place] [address]   Acetaminophen level     Status: Abnormal   Collection Time: 04/29/18 10:53 AM  Result Value Ref Range   Acetaminophen (Tylenol), Serum <10 (L) 10 - 30 ug/mL    Comment: (NOTE) Therapeutic concentrations vary significantly. A range of 10-30 ug/mL  may be an effective concentration for many patients. However, some  are best treated at concentrations outside of this range. Acetaminophen concentrations >150 ug/mL at 4 hours after ingestion  and >50 ug/mL at 12 hours after ingestion are often associated with  toxic reactions. Performed at [facility], [address], [place], [place] [address]   Salicylate level     Status: None   Collection Time: 04/29/18 10:53 AM  Result Value Ref Range   Salicylate Lvl  <7.0 2.8 - 30.0 mg/dL  Comment: Performed at [facility], [address] [place], [place] [id number]    Ct Abdomen Pelvis W Contrast  Result Date: 04/29/2018 CLINICAL DATA:  abd pain and nausea. Pt reports swallowing 20- AA batteries x2-4 weeks. [facility]. [facility]. Pt IVC'd today due to SI. Pt has hx of schzo effective. EXAM: CT ABDOMEN AND PELVIS WITH CONTRAST TECHNIQUE: Multidetector CT imaging of the abdomen and pelvis was performed using the standard protocol following bolus administration of intravenous contrast. CONTRAST:  100mL OMNIPAQUE IOHEXOL 300 MG/ML  SOLN COMPARISON:  Radiographs from earlier the same day FINDINGS: Lower chest: Trace pleural effusions. No pericardial effusion. Minimal dependent atelectasis posteriorly at the left lung base. Hepatobiliary: No focal liver abnormality is seen. No gallstones, gallbladder wall thickening, or biliary dilatation. Pancreas: Unremarkable. No pancreatic ductal dilatation or surrounding inflammatory changes. Spleen: Normal in size without focal abnormality. Adrenals/Urinary Tract: Normal adrenals. Normal kidneys. Urinary bladder incompletely distended, unremarkable. Stomach/Bowel: Stomach is nondistended. There is a 3.7 cm cm oval smoothly marginated foreign body in the gastric antrum. Small bowel is decompressed. Moderate colonic fecal material. No dilatation or wall thickening. Multiple intraluminal foreign bodies. Probable AA battery in the cecum, clustered with an adjacent screw and nonspecific 2.2 cm smoothly marginated metallic foreign body. Probable AA battery in the hepatic flexure. Metallic screw and separate 2.2 cm coin shaped metallic density in the distal transverse colon. Probable AA battery in the mid descending colon. Vascular/Lymphatic: No significant vascular findings are present. No enlarged abdominal or pelvic lymph nodes. Reproductive: Prostate is unremarkable. Other:  No ascites.  Left pelvic phleboliths.  No free air. Musculoskeletal: No acute or significant osseous findings. IMPRESSION: 1. Multiple foreign bodies in the stomach and colon as detailed above, without evidence of perforation, obstruction, regional inflammatory change, or bowel wall thickening. Electronically Signed   By: Corlis Leak  Hassell M.D.   On: 04/29/2018 12:31   Dg Abdomen Acute W/chest  Result Date: 04/29/2018 CLINICAL DATA:  Foreign body ingestion EXAM: DG ABDOMEN ACUTE W/ 1V CHEST COMPARISON:  04/27/08 FINDINGS: No abnormally dilated loops of bowel identified. There are multiple metallic radiopaque foreign bodies identified within the abdomen. Within the right lower quadrant of the abdomen there are approximately 5 foreign bodies including a screw, 2 batteries, and several discoid foreign bodies which may represent coins. In the left abdomen there are 3 metallic foreign bodies including a screw, a battery and presumed coin. Within the central abdomen there is a less dense, round foreign body which measures approximately 4.2 cm. Heart size appears normal. No pleural effusion. The lungs are clear. IMPRESSION: Multiple radiodense foreign bodies are identified in the distribution of the ascending colon, transverse colon and descending colon. Foreign bodies include several batteries, screws and possibly several coring. No evidence to suggest bowel obstruction. Electronically Signed   By: Signa Kellaylor  Stroud M.D.   On: 04/29/2018 11:36    ROS - all of the below systems have been reviewed with the patient and positives are indicated with bold text General: chills, fever or night sweats Eyes: blurry vision or double vision ENT: epistaxis or sore throat Allergy/Immunology: itchy/watery eyes or nasal congestion Hematologic/Lymphatic: bleeding problems, blood clots or swollen lymph nodes Endocrine: temperature intolerance or unexpected weight changes Breast: new or changing breast lumps or nipple discharge Resp:  cough, shortness of breath, or wheezing CV: chest pain or dyspnea on exertion GI: as per HPI GU: dysuria, trouble voiding, or hematuria MSK:  joint pain or joint stiffness Neuro: TIA or stroke symptoms Derm: pruritus and skin lesion changes Psych: anxiety and depression  PE Blood pressure 131/86, pulse 66, temperature 98.7 F (37.1 C), resp. rate 19, height 5\' 10"  (1.778 m), weight 79.4 kg, SpO2 100 %. Constitutional: NAD; conversant; no deformities Eyes: Moist conjunctiva; no lid lag; anicteric; PERRL Neck: Trachea midline; no thyromegaly Lungs: Normal respiratory effort; no tactile fremitus CV: RRR; no palpable thrills; no pitting edema GI: Abd soft, nontender, nondistended; no rebound; no guarding; no palpable hepatosplenomegaly MSK: Normal extremities; no clubbing/cyanosis Psychiatric: Appropriate affect; alert and oriented x3 Lymphatic: No palpable cervical or axillary lymphadenopathy  Results for orders placed or performed during the hospital encounter of 04/29/18 (from the past 48 hour(s))  Comprehensive metabolic panel     Status: Abnormal   Collection Time: 04/29/18 10:53 AM  Result Value Ref Range   Sodium 138 135 - 145 mmol/L   Potassium 4.0 3.5 - 5.1 mmol/L   Chloride 105 98 - 111 mmol/L   CO2 27 22 - 32 mmol/L   Glucose, Bld 78 70 - 99 mg/dL   BUN 15 6 - 20 mg/dL   Creatinine, Ser 9.14 0.61 - 1.24 mg/dL   Calcium 9.5 8.9 - 78.2 mg/dL   Total Protein 6.4 (L) 6.5 - 8.1 g/dL   Albumin 3.8 3.5 - 5.0 g/dL   AST 16 15 - 41 U/L   ALT 13 0 - 44 U/L   Alkaline Phosphatase 64 38 - 126 U/L   Total Bilirubin 0.6 0.3 - 1.2 mg/dL   GFR calc non Af Amer >60 >60 mL/min   GFR calc Af Amer >60 >60 mL/min   Anion gap 6 5 - 15    Comment: Performed at [facility], [address] [place], [place] [address]  CBC with Differential     Status: Abnormal   Collection Time: 04/29/18 10:53 AM  Result Value Ref Range   WBC 4.5 4.0 - 10.5 K/uL   RBC 4.64 4.22 - 5.81 MIL/uL    Hemoglobin 12.5 (L) 13.0 - 17.0 g/dL   HCT 30.8 65.7 - 84.6 %   MCV 89.9 80.0 - 100.0 fL   MCH 26.9 26.0 - 34.0 pg   MCHC 30.0 30.0 - 36.0 g/dL   RDW 96.2 95.2 - 84.1 %   Platelets 222 150 - 400 K/uL   nRBC 0.0 0.0 - 0.2 %   Neutrophils Relative % 61 %   Neutro Abs 2.8 1.7 - 7.7 K/uL   Lymphocytes Relative 25 %   Lymphs Abs 1.1 0.7 - 4.0 K/uL   Monocytes Relative 12 %   Monocytes Absolute 0.5 0.1 - 1.0 K/uL   Eosinophils Relative 1 %   Eosinophils Absolute 0.0 0.0 - 0.5 K/uL   Basophils Relative 1 %   Basophils Absolute 0.0 0.0 - 0.1 K/uL   Immature Granulocytes 0 %   Abs Immature Granulocytes 0.01 0.00 - 0.07 K/uL    Comment: Performed at [facility], [address], [place], [place] [address]  Ethanol     Status: None   Collection Time: 04/29/18 10:53 AM  Result Value Ref Range   Alcohol, Ethyl (B) <10 <10 mg/dL    Comment: (NOTE) Lowest detectable limit for serum alcohol is 10 mg/dL. For medical purposes only. Performed at [facility], [address] [place], [place] [address]   Urine rapid drug screen (hosp performed)     Status: None  Collection Time: 04/29/18 10:53 AM  Result Value Ref Range   Opiates NONE DETECTED NONE DETECTED   Cocaine NONE DETECTED NONE DETECTED   Benzodiazepines NONE DETECTED NONE DETECTED   Amphetamines NONE DETECTED NONE DETECTED   Tetrahydrocannabinol NONE DETECTED NONE DETECTED   Barbiturates NONE DETECTED NONE DETECTED    Comment: (NOTE) DRUG SCREEN FOR MEDICAL PURPOSES ONLY.  IF CONFIRMATION IS NEEDED FOR ANY PURPOSE, NOTIFY LAB WITHIN 5 DAYS. LOWEST DETECTABLE LIMITS FOR URINE DRUG SCREEN Drug Class                     Cutoff (ng/mL) Amphetamine and metabolites    1000 Barbiturate and metabolites    200 Benzodiazepine                 200 Tricyclics and metabolites     300 Opiates and metabolites        300 Cocaine and metabolites        300 THC                            50 Performed at [facility],  [address] [place], [place] [address]   Acetaminophen level     Status: Abnormal   Collection Time: 04/29/18 10:53 AM  Result Value Ref Range   Acetaminophen (Tylenol), Serum <10 (L) 10 - 30 ug/mL    Comment: (NOTE) Therapeutic concentrations vary significantly. A range of 10-30 ug/mL  may be an effective concentration for many patients. However, some  are best treated at concentrations outside of this range. Acetaminophen concentrations >150 ug/mL at 4 hours after ingestion  and >50 ug/mL at 12 hours after ingestion are often associated with  toxic reactions. Performed at [facility], [address] [place], [place] [address]   Salicylate level     Status: None   Collection Time: 04/29/18 10:53 AM  Result Value Ref Range   Salicylate Lvl <7.0 2.8 - 30.0 mg/dL    Comment: Performed at [facility], [address] [place], [place] [address]    Ct Abdomen Pelvis W Contrast  Result Date: 04/29/2018 CLINICAL DATA:  abd pain and nausea. Pt reports swallowing 20- AA batteries x2-4 weeks. Pt was [facility] at [facility] from [facility]. Pt came from [facility]. Pt IVC'd today due to SI. Pt has hx of schzo effective. EXAM: CT ABDOMEN AND PELVIS WITH CONTRAST TECHNIQUE: Multidetector CT imaging of the abdomen and pelvis was performed using the standard protocol following bolus administration of intravenous contrast. CONTRAST:  OMNIPAQUE IOHEXOL 300 MG/ML  SOLN COMPARISON:  Radiographs from earlier the same day FINDINGS: Lower chest: Trace pleural effusions. No pericardial effusion. Minimal dependent atelectasis posteriorly at the left lung base. Hepatobiliary: No focal liver abnormality is seen. No gallstones, gallbladder wall thickening, or biliary dilatation. Pancreas: Unremarkable. No pancreatic ductal dilatation or surrounding inflammatory changes. Spleen: Normal in size without focal abnormality. Adrenals/Urinary Tract: Normal adrenals. Normal kidneys. Urinary  bladder incompletely distended, unremarkable. Stomach/Bowel: Stomach is nondistended. There is a 3.7 cm cm oval smoothly marginated foreign body in the gastric antrum. Small bowel is decompressed. Moderate colonic fecal material. No dilatation or wall thickening. Multiple intraluminal foreign bodies. Probable AA battery in the cecum, clustered with an adjacent screw and nonspecific 2.2 cm smoothly marginated metallic foreign body. Probable AA battery in the hepatic flexure. Metallic screw and separate 2.2 cm coin shaped metallic density in  the distal transverse colon. Probable AA battery in the mid descending colon. Vascular/Lymphatic: No significant vascular findings are present. No enlarged abdominal or pelvic lymph nodes. Reproductive: Prostate is unremarkable. Other: No ascites.  Left pelvic phleboliths.  No free air. Musculoskeletal: No acute or significant osseous findings. IMPRESSION: 1. Multiple foreign bodies in the stomach and colon as detailed above, without evidence of perforation, obstruction, regional inflammatory change, or bowel wall thickening. Electronically Signed   By: Corlis Leak M.D.   On: 04/29/2018 12:31   Dg Abdomen Acute W/chest  Result Date: 04/29/2018 CLINICAL DATA:  Foreign body ingestion EXAM: DG ABDOMEN ACUTE W/ 1V CHEST COMPARISON:  04/27/08 FINDINGS: No abnormally dilated loops of bowel identified. There are multiple metallic radiopaque foreign bodies identified within the abdomen. Within the right lower quadrant of the abdomen there are approximately 5 foreign bodies including a screw, 2 batteries, and several discoid foreign bodies which may represent coins. In the left abdomen there are 3 metallic foreign bodies including a screw, a battery and presumed coin. Within the central abdomen there is a less dense, round foreign body which measures approximately 4.2 cm. Heart size appears normal. No pleural effusion. The lungs are clear. IMPRESSION: Multiple radiodense foreign bodies  are identified in the distribution of the ascending colon, transverse colon and descending colon. Foreign bodies include several batteries, screws and possibly several coring. No evidence to suggest bowel obstruction. Electronically Signed   By: Signa Kell M.D.   On: 04/29/2018 11:36   A/P: Lawrence Morgan is an 37 y.o. male with asymptomatic gastric bezoar - landscape stone  -No urgent/emergent surgery necessary at this time - we discussed potential for observation vs surgery to remove - he opted to give food a try and see how things go -Will discuss with my colleagues as well as this is a rather unusual case. Many gastric bezoars can be left alone if not able to be removed endoscopically unless causing obstructive symptoms. This being a stone is unusual. -Psychiatry to see him tomorrow per Dr. Butler Denmark; we will follow with you for time being  Stephanie Coup. Cliffton Asters, M.D. [facility]

## 2018-04-30 NOTE — Progress Notes (Signed)
Lawrence Morgan is a 37 y.o. male has presented with ingestion of multiple foreign bodies. The various methods of treatment have been discussed with the patient and family. After consideration of risks, benefits and other options for treatment, the patient has consented to  Procedure(s): EGD as a surgical intervention .  The patient's history has been reviewed, patient examined, no change in status, stable for surgery.  I have reviewed the patient's chart and labs.  Questions were answered to the patient's satisfaction.   Risks (bleeding, infection, bowel perforation that could require surgery, sedation-related changes in cardiopulmonary systems), benefits (identification and possible treatment of source of symptoms, exclusion of certain causes of symptoms), and alternatives (watchful waiting, radiographic imaging studies, empiric medical treatment)  were explained to patient in detail and patient wishes to proceed.  Kathi DerParag  MD, FACP 04/30/2018, 10:54 AM  Contact #  [phone]

## 2018-04-30 NOTE — Anesthesia Postprocedure Evaluation (Signed)
Anesthesia Post Note  Patient: Lawrence Morgan  Procedure(s) Performed: ESOPHAGOGASTRODUODENOSCOPY (EGD) WITH PROPOFOL (N/A ) FOREIGN BODY REMOVAL (N/A )     Patient location during evaluation: PACU Anesthesia Type: General Level of consciousness: awake and alert Pain management: pain level controlled Vital Signs Assessment: post-procedure vital signs reviewed and stable Respiratory status: spontaneous breathing, nonlabored ventilation, respiratory function stable and patient connected to nasal cannula oxygen Cardiovascular status: blood pressure returned to baseline and stable Postop Assessment: no apparent nausea or vomiting Anesthetic complications: no    Last Vitals:  Vitals:   04/30/18 1300 04/30/18 1324  BP: 125/69 131/86  Pulse: 62 66  Resp: 20 19  Temp:  37.1 C  SpO2: 100% 100%    Last Pain:  Vitals:   04/30/18 1300  TempSrc:   PainSc: 0-No pain                 , S

## 2018-04-30 NOTE — Progress Notes (Signed)
PROGRESS NOTE    Lawrence Morgan   GNF:[id number]  DOB: [birth year]  DOA: 04/29/2018 PCP: Fleet Contras, MD   Brief Narrative:  Lawrence Morgan   is a 37 y.o. male with a history of schizo affective disorder, bipolar disorder.  Patient seen for abdominal pain after ingesting multiple foreign objects earlier this week.  Patient has been at [facility] facility due to suicidal ideation and attempt by ingesting multiple foreign objects, including batteries and screws.  He was transported to the emergency department today after developing abdominal pain which is mostly in his right upper quadrant.  Pain is nonradiating.  It is improved now.  No other palliating or provoking factors.  Denies rectal bleeding, melena.    Subjective: No complaints - wants to eat ROS: no complaints of nausea, vomiting, constipation diarrhea, cough, dyspnea or dysuria. No other complaints.   Assessment & Plan:   Principal Problem:   Ingestion of foreign body, initial encounter - EGD showed stone in stomach- unable to be removed with various different methods - Gen surgery consulted- recommended to not intervene unless it causes issues- asked me to make him NPO after midnight if he has issues- GI started full liquids for now - further foreign bodies in colon are likely to be passed in stool - follow  Active Problems:   Schizoaffective disorder, bipolar type-   Suicide ideation - psych consult requested- recommended inpatient admission and increase in Lithium to 450 BID - cont other meds - will consult social work    DVT prophylaxis: SCDS Code Status: full code Family Communication: discussed with patient's parents Disposition Plan: will need psych inpatient transfer- not yet medically ready Consultants:   GI  Gen surgery  Psych  Procedures:   EGD Antimicrobials:  Anti-infectives (From admission, onward)   None     Objective: Vitals:   04/29/18 1700 04/29/18 1733 04/29/18 2125 04/30/18 0903  BP:  (!) 147/84 129/76 124/76 127/75  Pulse: 82 78 80 78  Resp: 20 18 18 19   Temp: (!) 97.1 F (36.2 C) 98.6 F (37 C) 98.4 F (36.9 C) 99.1 F (37.3 C)  TempSrc: Oral Oral Oral Oral  SpO2: 100% 100% 99% 100%  Weight:      Height:        Intake/Output Summary (Last 24 hours) at 04/30/2018 0915 Last data filed at 04/30/2018 0800 Gross per 24 hour  Intake 1514.18 ml  Output -  Net 1514.18 ml   Filed Weights   04/29/18 1046  Weight: 79.4 kg    Examination: General exam: Appears comfortable  HEENT: PERRLA, oral mucosa moist, no sclera icterus or thrush Respiratory system: Clear to auscultation. Respiratory effort normal. Cardiovascular system: S1 & S2 heard, RRR.   Gastrointestinal system: Abdomen soft, non-tender, nondistended. Normal bowel sounds. Central nervous system: Alert and oriented. No focal neurological deficits. Extremities: No cyanosis, clubbing or edema Skin: No rashes or ulcers Psychiatry:  Mood & affect appropriate.   Data Reviewed: I have personally reviewed following labs and imaging studies  CBC: Recent Labs  Lab 04/29/18 1053  WBC 4.5  NEUTROABS 2.8  HGB 12.5*  HCT 41.7  MCV 89.9  PLT 222   Basic Metabolic Panel: Recent Labs  Lab 04/29/18 1053  NA 138  K 4.0  CL 105  CO2 27  GLUCOSE 78  BUN 15  CREATININE 1.17  CALCIUM 9.5   GFR: Estimated Creatinine Clearance: 87.5 mL/min (by C-G formula based on SCr of 1.17 mg/dL). Liver Function Tests: Recent Labs  Lab 04/29/18 1053  AST 16  ALT 13  ALKPHOS 64  BILITOT 0.6  PROT 6.4*  ALBUMIN 3.8   No results for input(s): LIPASE, AMYLASE in the last 168 hours. No results for input(s): AMMONIA in the last 168 hours. Coagulation Profile: No results for input(s): INR, PROTIME in the last 168 hours. Cardiac Enzymes: No results for input(s): CKTOTAL, CKMB, CKMBINDEX, TROPONINI in the last 168 hours. BNP (last 3 results) No results for input(s): PROBNP in the last 8760 hours. HbA1C: No  results for input(s): HGBA1C in the last 72 hours. CBG: No results for input(s): GLUCAP in the last 168 hours. Lipid Profile: No results for input(s): CHOL, HDL, LDLCALC, TRIG, CHOLHDL, LDLDIRECT in the last 72 hours. Thyroid Function Tests: No results for input(s): TSH, T4TOTAL, FREET4, T3FREE, THYROIDAB in the last 72 hours. Anemia Panel: No results for input(s): VITAMINB12, FOLATE, FERRITIN, TIBC, IRON, RETICCTPCT in the last 72 hours. Urine analysis:    Component Value Date/Time   COLORURINE YELLOW 07/14/2008 0144   APPEARANCEUR CLOUDY (A) 07/14/2008 0144   LABSPEC 1.029 07/14/2008 0144   PHURINE 6.0 07/14/2008 0144   GLUCOSEU NEGATIVE 07/14/2008 0144   HGBUR NEGATIVE 07/14/2008 0144   BILIRUBINUR NEGATIVE 07/14/2008 0144   KETONESUR TRACE (A) 07/14/2008 0144   PROTEINUR NEGATIVE 07/14/2008 0144   UROBILINOGEN 1.0 07/14/2008 0144   NITRITE NEGATIVE 07/14/2008 0144   LEUKOCYTESUR  07/14/2008 0144    NEGATIVE MICROSCOPIC NOT DONE ON URINES WITH NEGATIVE PROTEIN, BLOOD, LEUKOCYTES, NITRITE, OR GLUCOSE <1000 mg/dL.   Sepsis Labs: @LABRCNTIP (procalcitonin:4,lacticidven:4) )No results found for this or any previous visit (from the past 240 hour(s)).    Radiology Studies: Ct Abdomen Pelvis W Contrast  Result Date: 04/29/2018 CLINICAL DATA:  abd pain and nausea. Pt reports swallowing 20- AA batteries x2-4 weeks. Pt was [facility] at [facility] from [facility]. Pt came from [facility]. Pt IVC'd today due to SI. Pt has hx of schzo effective. EXAM: CT ABDOMEN AND PELVIS WITH CONTRAST TECHNIQUE: Multidetector CT imaging of the abdomen and pelvis was performed using the standard protocol following bolus administration of intravenous contrast. CONTRAST:  OMNIPAQUE IOHEXOL 300 MG/ML  SOLN COMPARISON:  Radiographs from earlier the same day FINDINGS: Lower chest: Trace pleural effusions. No pericardial effusion. Minimal dependent atelectasis posteriorly at the left lung base.  Hepatobiliary: No focal liver abnormality is seen. No gallstones, gallbladder wall thickening, or biliary dilatation. Pancreas: Unremarkable. No pancreatic ductal dilatation or surrounding inflammatory changes. Spleen: Normal in size without focal abnormality. Adrenals/Urinary Tract: Normal adrenals. Normal kidneys. Urinary bladder incompletely distended, unremarkable. Stomach/Bowel: Stomach is nondistended. There is a 3.7 cm cm oval smoothly marginated foreign body in the gastric antrum. Small bowel is decompressed. Moderate colonic fecal material. No dilatation or wall thickening. Multiple intraluminal foreign bodies. Probable AA battery in the cecum, clustered with an adjacent screw and nonspecific 2.2 cm smoothly marginated metallic foreign body. Probable AA battery in the hepatic flexure. Metallic screw and separate 2.2 cm coin shaped metallic density in the distal transverse colon. Probable AA battery in the mid descending colon. Vascular/Lymphatic: No significant vascular findings are present. No enlarged abdominal or pelvic lymph nodes. Reproductive: Prostate is unremarkable. Other: No ascites.  Left pelvic phleboliths.  No free air. Musculoskeletal: No acute or significant osseous findings. IMPRESSION: 1. Multiple foreign bodies in the stomach and colon as detailed above, without evidence of perforation, obstruction, regional inflammatory change, or bowel wall thickening. Electronically Signed   By: Ronald Pippins.D.  On: 04/29/2018 12:31   Dg Abdomen Acute W/chest  Result Date: 04/29/2018 CLINICAL DATA:  Foreign body ingestion EXAM: DG ABDOMEN ACUTE W/ 1V CHEST COMPARISON:  04/27/08 FINDINGS: No abnormally dilated loops of bowel identified. There are multiple metallic radiopaque foreign bodies identified within the abdomen. Within the right lower quadrant of the abdomen there are approximately 5 foreign bodies including a screw, 2 batteries, and several discoid foreign bodies which may represent coins.  In the left abdomen there are 3 metallic foreign bodies including a screw, a battery and presumed coin. Within the central abdomen there is a less dense, round foreign body which measures approximately 4.2 cm. Heart size appears normal. No pleural effusion. The lungs are clear. IMPRESSION: Multiple radiodense foreign bodies are identified in the distribution of the ascending colon, transverse colon and descending colon. Foreign bodies include several batteries, screws and possibly several coring. No evidence to suggest bowel obstruction. Electronically Signed   By: Signa Kellaylor  Stroud M.D.   On: 04/29/2018 11:36      Scheduled Meds: . atomoxetine  25 mg Oral Daily  . benztropine  0.5 mg Oral Daily  . cloZAPine  200 mg Oral QHS  . lithium carbonate  300 mg Oral Daily  . lithium carbonate  600 mg Oral QHS  . loratadine  10 mg Oral Daily  . polyethylene glycol  17 g Oral Daily  . sertraline  100 mg Oral Daily   Continuous Infusions: . sodium chloride 20 mL/hr at 04/30/18 0300     LOS: 0 days    Time spent in minutes: 35    Calvert CantorSaima , MD [facility] Pager: [email or web address] Password [email or web address] 04/30/2018, 9:15 AM

## 2018-04-30 NOTE — Op Note (Signed)
[facility] Patient Name: Lawrence Morgan Procedure Date : 04/30/2018 MRN: [id number] Attending MD: Kathi Der , MD Date of Birth: [birth year] CSN: [id number] Age: 37 Admit Type: Inpatient Procedure:                Upper GI endoscopy Indications:              Foreign body in the stomach Providers:                Kathi Der, MD, Janae Sauce. Steele Berg, RN, Verita Schneiders, Technician Referring MD:              Medicines:                See the Anesthesia note for documentation of the                            administered medications Complications:            No immediate complications. Estimated Blood Loss:     Estimated blood loss was minimal. Procedure:                Pre-Anesthesia Assessment:                           - Prior to the procedure, a History and Physical                            was performed, and patient medications and                            allergies were reviewed. The patient's tolerance of                            previous anesthesia was also reviewed. The risks                            and benefits of the procedure and the sedation                            options and risks were discussed with the patient.                            All questions were answered, and informed consent                            was obtained. Prior Anticoagulants: The patient has                            taken no previous anticoagulant or antiplatelet                            agents. ASA Grade Assessment: II - A patient with  mild systemic disease. After reviewing the risks                            and benefits, the patient was deemed in                            satisfactory condition to undergo the procedure.                           After obtaining informed consent, the endoscope was                            passed under direct vision. Throughout the                            procedure, the  patient's blood pressure, pulse, and                            oxygen saturations were monitored continuously. The                            GIF-H190 ([id number]) Olympus Adult EGD was introduced                            through the mouth, and advanced to the second part                            of duodenum. The [id number]-[id number] ([id number]) Olympus EGD                            Double Channel was introduced through the mouth,                            and advanced to the second part of duodenum. The                            upper GI endoscopy was technically difficult and                            complex due to presence of foreign body. The                            patient tolerated the procedure well. Scope In: Scope Out: Findings:      The Z-line was variable and was found 35 cm from the incisors.      A medium-sized hiatal hernia was present.      [employer] of 3.7 cm with smooth margins was found in the       gastric fundus. removal of stone was attempted using multiple different       methods. different equipments such as a Roth net, resque net , talon       grasper and trapezoid basket were used.double-channel scope was used in       attempt to grab the stone with 2 nets as well  as net and trapezoid       basket were also performed.I was able to pull the stone up into the       distal esophagus but against stone slipped back to stomach.I was not       able to remove the stone despite of prolonged procedure of around 45       minutes.      The duodenal bulb, first portion of the duodenum and second portion of       the duodenum were normal. Impression:               - Z-line variable, 35 cm from the incisors.                           - Medium-sized hiatal hernia.                           - [employer] of 3.7 cm with smooth                            margins were found in the stomach.                           - Normal duodenal bulb, first portion of the                             duodenum and second portion of the duodenum.                           - No specimens collected. Recommendation:           - Return patient to hospital ward for ongoing care.                           - Resume previous diet.                           - Continue present medications.                           - Refer to a surgeon today. Procedure Code(s):        --- Professional ---                           [phone], 22, Esophagogastroduodenoscopy, flexible,                            transoral; diagnostic, including collection of                            specimen(s) by brushing or washing, when performed                            (separate procedure) Diagnosis Code(s):        --- Professional ---                           K22.8,  Other specified diseases of esophagus                           K44.9, Diaphragmatic hernia without obstruction or                            gangrene                           T18.2XXA, Foreign body in stomach, initial encounter CPT copyright 2018 [employer]. All rights reserved. The codes documented in this report are preliminary and upon coder review may  be revised to meet current compliance requirements. Kathi DerParag , MD Kathi DerParag , MD 04/30/2018 12:22:05 PM Number of Addenda: 0

## 2018-05-01 DIAGNOSIS — T183XXA Foreign body in small intestine, initial encounter: Secondary | ICD-10-CM | POA: Diagnosis not present

## 2018-05-01 DIAGNOSIS — Z888 Allergy status to other drugs, medicaments and biological substances status: Secondary | ICD-10-CM | POA: Diagnosis not present

## 2018-05-01 DIAGNOSIS — Z91013 Allergy to seafood: Secondary | ICD-10-CM | POA: Diagnosis not present

## 2018-05-01 DIAGNOSIS — T182XXA Foreign body in stomach, initial encounter: Secondary | ICD-10-CM | POA: Diagnosis not present

## 2018-05-01 DIAGNOSIS — T182XXD Foreign body in stomach, subsequent encounter: Secondary | ICD-10-CM | POA: Diagnosis not present

## 2018-05-01 DIAGNOSIS — F17290 Nicotine dependence, other tobacco product, uncomplicated: Secondary | ICD-10-CM | POA: Diagnosis not present

## 2018-05-01 DIAGNOSIS — X838XXA Intentional self-harm by other specified means, initial encounter: Secondary | ICD-10-CM | POA: Diagnosis not present

## 2018-05-01 DIAGNOSIS — Z751 Person awaiting admission to adequate facility elsewhere: Secondary | ICD-10-CM | POA: Diagnosis not present

## 2018-05-01 DIAGNOSIS — F609 Personality disorder, unspecified: Secondary | ICD-10-CM | POA: Diagnosis not present

## 2018-05-01 DIAGNOSIS — F250 Schizoaffective disorder, bipolar type: Secondary | ICD-10-CM | POA: Diagnosis not present

## 2018-05-01 DIAGNOSIS — T1491XA Suicide attempt, initial encounter: Secondary | ICD-10-CM | POA: Diagnosis not present

## 2018-05-01 DIAGNOSIS — R45851 Suicidal ideations: Secondary | ICD-10-CM | POA: Diagnosis not present

## 2018-05-01 DIAGNOSIS — Y92099 Unspecified place in other non-institutional residence as the place of occurrence of the external cause: Secondary | ICD-10-CM | POA: Diagnosis not present

## 2018-05-01 DIAGNOSIS — F632 Kleptomania: Secondary | ICD-10-CM | POA: Diagnosis not present

## 2018-05-01 DIAGNOSIS — F4310 Post-traumatic stress disorder, unspecified: Secondary | ICD-10-CM | POA: Diagnosis not present

## 2018-05-01 DIAGNOSIS — K449 Diaphragmatic hernia without obstruction or gangrene: Secondary | ICD-10-CM | POA: Diagnosis not present

## 2018-05-01 DIAGNOSIS — F70 Mild intellectual disabilities: Secondary | ICD-10-CM | POA: Diagnosis not present

## 2018-05-01 DIAGNOSIS — T189XXA Foreign body of alimentary tract, part unspecified, initial encounter: Secondary | ICD-10-CM | POA: Diagnosis not present

## 2018-05-01 DIAGNOSIS — R1084 Generalized abdominal pain: Secondary | ICD-10-CM | POA: Diagnosis not present

## 2018-05-01 DIAGNOSIS — Z79899 Other long term (current) drug therapy: Secondary | ICD-10-CM | POA: Diagnosis not present

## 2018-05-01 NOTE — Progress Notes (Addendum)
PROGRESS NOTE    Lawrence Morgan   ZOX:[id number]  DOB: 01/31/1981  DOA: 04/29/2018 PCP: Fleet Contras, MD   Brief Narrative:  Lawrence Morgan   is a 37 y.o. male with a history of schizo affective disorder, bipolar disorder.  Patient seen for abdominal pain after ingesting multiple foreign objects earlier this week.  Patient has been at [facility] facility due to suicidal ideation and attempt by ingesting multiple foreign objects, including batteries and screws.  He was transported to the emergency department today after developing abdominal pain which is mostly in his right upper quadrant.  Pain is nonradiating.  It is improved now.  No other palliating or provoking factors.  Denies rectal bleeding, melena.    Subjective: No complaints - wants to eat, getting liquid diet now. No BM, no n/v, no diarrhea   Assessment & Plan:     Ingestion of foreign body, initial encounter - EGD showed stone in stomach- unable to be removed with various different methods - Gen surgery consulted- recommended to not intervene unless it causes issues.  GI started full liquids but pt is NPO this am. Pt wants solid food, will defer to surg/ GI re: diet - further foreign bodies in colon are likely to be passed in stool    Schizoaffective disorder, bipolar type/ Suicide ideation - psych consult done, they recommended inpatient admission and increase in Lithium to 450 BID - cont other meds - have consulted social work for psych admit when medically stable    DVT prophylaxis: SCDS Code Status: full code Family Communication: none here today Disposition Plan: will need psych inpatient transfer- not yet medically ready   Vinson Moselle MD [facility] pgr [phone] 10/23/2017, 9:25 AM   Consultants:   GI  Gen surgery  Psych  Procedures:   EGD Antimicrobials:  Anti-infectives (From admission, onward)   None     Objective: Vitals:   04/30/18 1324 04/30/18 2223 05/01/18 0519  05/01/18 0519  BP: 131/86 114/71 121/75 121/75  Pulse: 66 68 74 74  Resp: 19 20 18 18   Temp: 98.7 F (37.1 C) 98.5 F (36.9 C) 98.1 F (36.7 C) 98.1 F (36.7 C)  TempSrc:  Oral Oral Oral  SpO2: 100% 97% 100% 100%  Weight:      Height:        Intake/Output Summary (Last 24 hours) at 05/01/2018 0928 Last data filed at 04/30/2018 1713 Gross per 24 hour  Intake 2754.69 ml  Output 250 ml  Net 2504.69 ml   Filed Weights   04/29/18 1046  Weight: 79.4 kg    Examination: General exam: Appears comfortable  HEENT: PERRLA, oral mucosa moist, no sclera icterus or thrush Respiratory system: Clear to auscultation. Respiratory effort normal. Cardiovascular system: S1 & S2 heard, RRR.   Gastrointestinal system: Abdomen soft, non-tender, nondistended. Normal bowel sounds. Central nervous system: Alert and oriented. No focal neurological deficits. Extremities: No cyanosis, clubbing or edema Skin: No rashes or ulcers Psychiatry:  Mood & affect appropriate.   Data Reviewed: I have personally reviewed following labs and imaging studies  CBC: Recent Labs  Lab 04/29/18 1053  WBC 4.5  NEUTROABS 2.8  HGB 12.5*  HCT 41.7  MCV 89.9  PLT 222   Basic Metabolic Panel: Recent Labs  Lab 04/29/18 1053  NA 138  K 4.0  CL 105  CO2 27  GLUCOSE 78  BUN 15  CREATININE 1.17  CALCIUM 9.5   GFR: Estimated Creatinine Clearance: 87.5 mL/min (by C-G formula  based on SCr of 1.17 mg/dL). Liver Function Tests: Recent Labs  Lab 04/29/18 1053  AST 16  ALT 13  ALKPHOS 64  BILITOT 0.6  PROT 6.4*  ALBUMIN 3.8   No results for input(s): LIPASE, AMYLASE in the last 168 hours. No results for input(s): AMMONIA in the last 168 hours. Coagulation Profile: No results for input(s): INR, PROTIME in the last 168 hours. Cardiac Enzymes: No results for input(s): CKTOTAL, CKMB, CKMBINDEX, TROPONINI in the last 168 hours. BNP (last 3 results) No results for input(s): PROBNP in the last 8760  hours. HbA1C: No results for input(s): HGBA1C in the last 72 hours. CBG: No results for input(s): GLUCAP in the last 168 hours. Lipid Profile: No results for input(s): CHOL, HDL, LDLCALC, TRIG, CHOLHDL, LDLDIRECT in the last 72 hours. Thyroid Function Tests: No results for input(s): TSH, T4TOTAL, FREET4, T3FREE, THYROIDAB in the last 72 hours. Anemia Panel: No results for input(s): VITAMINB12, FOLATE, FERRITIN, TIBC, IRON, RETICCTPCT in the last 72 hours. Urine analysis:    Component Value Date/Time   COLORURINE YELLOW 07/14/2008 0144   APPEARANCEUR CLOUDY (A) 07/14/2008 0144   LABSPEC 1.029 07/14/2008 0144   PHURINE 6.0 07/14/2008 0144   GLUCOSEU NEGATIVE 07/14/2008 0144   HGBUR NEGATIVE 07/14/2008 0144   BILIRUBINUR NEGATIVE 07/14/2008 0144   KETONESUR TRACE (A) 07/14/2008 0144   PROTEINUR NEGATIVE 07/14/2008 0144   UROBILINOGEN 1.0 07/14/2008 0144   NITRITE NEGATIVE 07/14/2008 0144   LEUKOCYTESUR  07/14/2008 0144    NEGATIVE MICROSCOPIC NOT DONE ON URINES WITH NEGATIVE PROTEIN, BLOOD, LEUKOCYTES, NITRITE, OR GLUCOSE <1000 mg/dL.   Sepsis Labs: @LABRCNTIP (procalcitonin:4,lacticidven:4) )No results found for this or any previous visit (from the past 240 hour(s)).    Radiology Studies: Ct Abdomen Pelvis W Contrast  Result Date: 04/29/2018 CLINICAL DATA:  abd pain and nausea. Pt reports swallowing 20- AA batteries x2-4 weeks. [facility] came from [facility]. Pt IVC'd today due to SI. Pt has hx of schzo effective. EXAM: CT ABDOMEN AND PELVIS WITH CONTRAST TECHNIQUE: Multidetector CT imaging of the abdomen and pelvis was performed using the standard protocol following bolus administration of intravenous contrast. CONTRAST:  100mL OMNIPAQUE IOHEXOL 300 MG/ML  SOLN COMPARISON:  Radiographs from earlier the same day FINDINGS: Lower chest: Trace pleural effusions. No pericardial effusion. Minimal dependent atelectasis posteriorly at the  left lung base. Hepatobiliary: No focal liver abnormality is seen. No gallstones, gallbladder wall thickening, or biliary dilatation. Pancreas: Unremarkable. No pancreatic ductal dilatation or surrounding inflammatory changes. Spleen: Normal in size without focal abnormality. Adrenals/Urinary Tract: Normal adrenals. Normal kidneys. Urinary bladder incompletely distended, unremarkable. Stomach/Bowel: Stomach is nondistended. There is a 3.7 cm cm oval smoothly marginated foreign body in the gastric antrum. Small bowel is decompressed. Moderate colonic fecal material. No dilatation or wall thickening. Multiple intraluminal foreign bodies. Probable AA battery in the cecum, clustered with an adjacent screw and nonspecific 2.2 cm smoothly marginated metallic foreign body. Probable AA battery in the hepatic flexure. Metallic screw and separate 2.2 cm coin shaped metallic density in the distal transverse colon. Probable AA battery in the mid descending colon. Vascular/Lymphatic: No significant vascular findings are present. No enlarged abdominal or pelvic lymph nodes. Reproductive: Prostate is unremarkable. Other: No ascites.  Left pelvic phleboliths.  No free air. Musculoskeletal: No acute or significant osseous findings. IMPRESSION: 1. Multiple foreign bodies in the stomach and colon as detailed above, without evidence of perforation, obstruction, regional inflammatory change, or bowel wall  thickening. Electronically Signed   By: Corlis Leak M.D.   On: 04/29/2018 12:31   Dg Abdomen Acute W/chest  Result Date: 04/29/2018 CLINICAL DATA:  Foreign body ingestion EXAM: DG ABDOMEN ACUTE W/ 1V CHEST COMPARISON:  04/27/08 FINDINGS: No abnormally dilated loops of bowel identified. There are multiple metallic radiopaque foreign bodies identified within the abdomen. Within the right lower quadrant of the abdomen there are approximately 5 foreign bodies including a screw, 2 batteries, and several discoid foreign bodies which may  represent coins. In the left abdomen there are 3 metallic foreign bodies including a screw, a battery and presumed coin. Within the central abdomen there is a less dense, round foreign body which measures approximately 4.2 cm. Heart size appears normal. No pleural effusion. The lungs are clear. IMPRESSION: Multiple radiodense foreign bodies are identified in the distribution of the ascending colon, transverse colon and descending colon. Foreign bodies include several batteries, screws and possibly several coring. No evidence to suggest bowel obstruction. Electronically Signed   By: Signa Kell M.D.   On: 04/29/2018 11:36      Scheduled Meds: . atomoxetine  25 mg Oral Daily  . benztropine  0.5 mg Oral Daily  . cloZAPine  200 mg Oral QHS  . lithium carbonate  450 mg Oral Q12H  . loratadine  10 mg Oral Daily  . polyethylene glycol  17 g Oral Daily  . sertraline  100 mg Oral Daily   Continuous Infusions:    LOS: 0 days

## 2018-05-01 NOTE — Progress Notes (Signed)
[facility] Progress Note  1 Day Post-Op  Subjective: CC: wants to eat Patient wants to eat. Asks multiple times if he can have surgery today. Discussed that I see no indications for emergent surgery currently. Patient denies nausea and is passing flatus. Reports some mild dull abdominal pain in upper abdomen. Also reports that he is having auditory hallucinations.   Objective: Vital signs in last 24 hours: Temp:  [97.5 F (36.4 C)-98.7 F (37.1 C)] 98.1 F (36.7 C) (12/02 0519) Pulse Rate:  [62-87] 74 (12/02 0519) Resp:  [18-24] 18 (12/02 0519) BP: (114-133)/(56-86) 121/75 (12/02 0519) SpO2:  [97 %-100 %] 100 % (12/02 0519) Last BM Date: 04/29/18  Intake/Output from previous day: 12/01 0701 - 12/02 0700 In: 2754.7 [P.O.:1062; I.V.:1617.7] Out: 250 [Urine:250] Intake/Output this shift: No intake/output data recorded.  PE: Gen:  Alert, NAD, pleasant Card:  Regular rate and rhythm Pulm:  Normal effort, clear to auscultation bilaterally Abd: Soft, non-tender with palpation, non-distended, bowel sounds present  Skin: warm and dry, no rashes  Psych: A&Ox3   Lab Results:  Recent Labs    04/29/18 1053  WBC 4.5  HGB 12.5*  HCT 41.7  PLT 222   BMET Recent Labs    04/29/18 1053  NA 138  K 4.0  CL 105  CO2 27  GLUCOSE 78  BUN 15  CREATININE 1.17  CALCIUM 9.5   PT/INR No results for input(s): LABPROT, INR in the last 72 hours. CMP     Component Value Date/Time   NA 138 04/29/2018 1053   K 4.0 04/29/2018 1053   CL 105 04/29/2018 1053   CO2 27 04/29/2018 1053   GLUCOSE 78 04/29/2018 1053   BUN 15 04/29/2018 1053   CREATININE 1.17 04/29/2018 1053   CALCIUM 9.5 04/29/2018 1053   PROT 6.4 (L) 04/29/2018 1053   ALBUMIN 3.8 04/29/2018 1053   AST 16 04/29/2018 1053   ALT 13 04/29/2018 1053   ALKPHOS 64 04/29/2018 1053   BILITOT 0.6 04/29/2018 1053   GFRNONAA >60 04/29/2018 1053   GFRAA >60 04/29/2018 1053   Lipase  No results found for:  LIPASE     Studies/Results: Ct Abdomen Pelvis W Contrast  Result Date: 04/29/2018 CLINICAL DATA:  abd pain and nausea. Pt reports swallowing 20- AA batteries x2-4 weeks. [facility]. Pt IVC'd today due to SI. Pt has hx of schzo effective. EXAM: CT ABDOMEN AND PELVIS WITH CONTRAST TECHNIQUE: Multidetector CT imaging of the abdomen and pelvis was performed using the standard protocol following bolus administration of intravenous contrast. CONTRAST:  100mL OMNIPAQUE IOHEXOL 300 MG/ML  SOLN COMPARISON:  Radiographs from earlier the same day FINDINGS: Lower chest: Trace pleural effusions. No pericardial effusion. Minimal dependent atelectasis posteriorly at the left lung base. Hepatobiliary: No focal liver abnormality is seen. No gallstones, gallbladder wall thickening, or biliary dilatation. Pancreas: Unremarkable. No pancreatic ductal dilatation or surrounding inflammatory changes. Spleen: Normal in size without focal abnormality. Adrenals/Urinary Tract: Normal adrenals. Normal kidneys. Urinary bladder incompletely distended, unremarkable. Stomach/Bowel: Stomach is nondistended. There is a 3.7 cm cm oval smoothly marginated foreign body in the gastric antrum. Small bowel is decompressed. Moderate colonic fecal material. No dilatation or wall thickening. Multiple intraluminal foreign bodies. Probable AA battery in the cecum, clustered with an adjacent screw and nonspecific 2.2 cm smoothly marginated metallic foreign body. Probable AA battery in the hepatic flexure. Metallic screw and separate 2.2 cm coin shaped  metallic density in the distal transverse colon. Probable AA battery in the mid descending colon. Vascular/Lymphatic: No significant vascular findings are present. No enlarged abdominal or pelvic lymph nodes. Reproductive: Prostate is unremarkable. Other: No ascites.  Left pelvic phleboliths.  No free air. Musculoskeletal: No acute or  significant osseous findings. IMPRESSION: 1. Multiple foreign bodies in the stomach and colon as detailed above, without evidence of perforation, obstruction, regional inflammatory change, or bowel wall thickening. Electronically Signed   By: Corlis Leak M.D.   On: 04/29/2018 12:31   Dg Abdomen Acute W/chest  Result Date: 04/29/2018 CLINICAL DATA:  Foreign body ingestion EXAM: DG ABDOMEN ACUTE W/ 1V CHEST COMPARISON:  04/27/08 FINDINGS: No abnormally dilated loops of bowel identified. There are multiple metallic radiopaque foreign bodies identified within the abdomen. Within the right lower quadrant of the abdomen there are approximately 5 foreign bodies including a screw, 2 batteries, and several discoid foreign bodies which may represent coins. In the left abdomen there are 3 metallic foreign bodies including a screw, a battery and presumed coin. Within the central abdomen there is a less dense, round foreign body which measures approximately 4.2 cm. Heart size appears normal. No pleural effusion. The lungs are clear. IMPRESSION: Multiple radiodense foreign bodies are identified in the distribution of the ascending colon, transverse colon and descending colon. Foreign bodies include several batteries, screws and possibly several coring. No evidence to suggest bowel obstruction. Electronically Signed   By: Signa Kell M.D.   On: 04/29/2018 11:36    Anti-infectives: Anti-infectives (From admission, onward)   None       Assessment/Plan Schizoaffective disorder Suicidal ideation  Foreign body in the stomach - swallowed a river rock - Dr. Cliffton Asters discussed with Dr. Meridee Score yesterday who reports the stone may be retrievable endoscopically with larger net which [employer] does [employer] - patient is not showing obstructive symptoms so would not recommend urgent/emergent surgical intervention  - recommend diet advancement if no procedure planned by GI and seeing how patient  tolerates  FEN: NPO VTE: SCDs ID: no current abx  Plan: Psych recommending inpatient psych when medically cleared. Await GI recommendations, diet per GI. No acute surgical intervention at this time.   LOS: 0 days    Wells Guiles , [facility] 05/01/2018, 9:22 AM Pager: [phone] Consults: [phone] Mon-Fri 7:00 am-4:30 pm Sat-Sun 7:00 am-11:30 am

## 2018-05-01 NOTE — Social Work (Signed)
CSW acknowledging consult and recommendations for inpatient psychiatry. Await clearance by surgical team before [facility].  Octavio GravesIsabel , MSW, [facility] [place] [facility] Social Work [phone]

## 2018-05-01 NOTE — Progress Notes (Signed)
[facility] PROGRESS NOTE Subjective Patient with slight epigastric discomfort.  He is hungry wants to eat  Objective: Vital signs in last 24 hours: Temp:  [97.5 F (36.4 C)-98.7 F (37.1 C)] 98.1 F (36.7 C) (12/02 0519) Pulse Rate:  [62-87] 74 (12/02 0519) Resp:  [18-24] 18 (12/02 0519) BP: (114-133)/(56-86) 121/75 (12/02 0519) SpO2:  [97 %-100 %] 100 % (12/02 0519) Last BM Date: 04/29/18  Intake/Output from previous day: 12/01 0701 - 12/02 0700 In: 2754.7 [P.O.:1062; I.V.:1617.7] Out: 250 [Urine:250] Intake/Output this shift: No intake/output data recorded.  PE:  Abdomen--good bowel sounds no gross tenderness  Lab Results: Recent Labs    04/29/18 1053  WBC 4.5  HGB 12.5*  HCT 41.7  PLT 222   BMET Recent Labs    04/29/18 1053  NA 138  K 4.0  CL 105  CO2 27  CREATININE 1.17   LFT Recent Labs    04/29/18 1053  PROT 6.4*  AST 16  ALT 13  ALKPHOS 64  BILITOT 0.6   PT/INR No results for input(s): LABPROT, INR in the last 72 hours. PANCREAS No results for input(s): LIPASE in the last 72 hours.       Studies/Results: Ct Abdomen Pelvis W Contrast  Result Date: 04/29/2018 CLINICAL DATA:  abd pain and nausea. Pt reports swallowing 20- AA batteries x2-4 weeks. [facility]. Pt IVC'd today due to SI. Pt has hx of schzo effective. EXAM: CT ABDOMEN AND PELVIS WITH CONTRAST TECHNIQUE: Multidetector CT imaging of the abdomen and pelvis was performed using the standard protocol following bolus administration of intravenous contrast. CONTRAST:  100mL OMNIPAQUE IOHEXOL 300 MG/ML  SOLN COMPARISON:  Radiographs from earlier the same day FINDINGS: Lower chest: Trace pleural effusions. No pericardial effusion. Minimal dependent atelectasis posteriorly at the left lung base. Hepatobiliary: No focal liver abnormality is seen. No gallstones, gallbladder wall thickening, or biliary dilatation.  Pancreas: Unremarkable. No pancreatic ductal dilatation or surrounding inflammatory changes. Spleen: Normal in size without focal abnormality. Adrenals/Urinary Tract: Normal adrenals. Normal kidneys. Urinary bladder incompletely distended, unremarkable. Stomach/Bowel: Stomach is nondistended. There is a 3.7 cm cm oval smoothly marginated foreign body in the gastric antrum. Small bowel is decompressed. Moderate colonic fecal material. No dilatation or wall thickening. Multiple intraluminal foreign bodies. Probable AA battery in the cecum, clustered with an adjacent screw and nonspecific 2.2 cm smoothly marginated metallic foreign body. Probable AA battery in the hepatic flexure. Metallic screw and separate 2.2 cm coin shaped metallic density in the distal transverse colon. Probable AA battery in the mid descending colon. Vascular/Lymphatic: No significant vascular findings are present. No enlarged abdominal or pelvic lymph nodes. Reproductive: Prostate is unremarkable. Other: No ascites.  Left pelvic phleboliths.  No free air. Musculoskeletal: No acute or significant osseous findings. IMPRESSION: 1. Multiple foreign bodies in the stomach and colon as detailed above, without evidence of perforation, obstruction, regional inflammatory change, or bowel wall thickening. Electronically Signed   By: Corlis Leak  Hassell M.D.   On: 04/29/2018 12:31   Dg Abdomen Acute W/chest  Result Date: 04/29/2018 CLINICAL DATA:  Foreign body ingestion EXAM: DG ABDOMEN ACUTE W/ 1V CHEST COMPARISON:  04/27/08 FINDINGS: No abnormally dilated loops of bowel identified. There are multiple metallic radiopaque foreign bodies identified within the abdomen. Within the right lower quadrant of the abdomen there are approximately 5 foreign bodies including a screw, 2 batteries, and several discoid foreign bodies which may represent coins. In  the left abdomen there are 3 metallic foreign bodies including a screw, a battery and presumed coin. Within the  central abdomen there is a less dense, round foreign body which measures approximately 4.2 cm. Heart size appears normal. No pleural effusion. The lungs are clear. IMPRESSION: Multiple radiodense foreign bodies are identified in the distribution of the ascending colon, transverse colon and descending colon. Foreign bodies include several batteries, screws and possibly several coring. No evidence to suggest bowel obstruction. Electronically Signed   By: Signa Kell M.D.   On: 04/29/2018 11:36    Medications: I have reviewed the patient's current medications.  Assessment:   1.  Foreign body in stomach.  Dr. Meridee Score and I reviewed the films.  He called and discussed with the advanced endoscopic procedure physician at [facility].  Stone is 3.7 cm x 3.0 cm.  Removal endoscopically unsuccessful by Dr. Levora Angel with multiple different tools.  There was concern that even if the stone could be pulled into the esophagus getting it past the upper esophageal sphincter and airway would be risky.  The feeling was that the best approach would be to remove it surgically.   Plan: Discussed with Dr. Janee Morn.  We will go ahead and start patient on full liquids and follow him clinically.   Tresea Mall 05/01/2018, 11:00 AM  This note was created using voice recognition software. Minor errors may Have occurred unintentionally.  Pager: [phone] If no answer or after hours call [phone]

## 2018-05-02 ENCOUNTER — Encounter (HOSPITAL_COMMUNITY): Payer: Self-pay | Admitting: General Practice

## 2018-05-02 ENCOUNTER — Other Ambulatory Visit: Payer: Self-pay

## 2018-05-02 MED ORDER — ZOLPIDEM TARTRATE 5 MG PO TABS
5.0000 mg | ORAL_TABLET | Freq: Once | ORAL | Status: AC
  Administered 2018-05-02: 5 mg via ORAL
  Filled 2018-05-02: qty 1

## 2018-05-02 NOTE — Progress Notes (Signed)
Patient walking in halls denies any abdominal pain.  Stated that he wants surgery to remove the stone or some other procedure to remove it right away.  I explained to him again that I have consulted with multiple different GI doctors and the feeling is that there is no safe way to remove it endoscopically.  We will be available in the future if we can be of any further help.

## 2018-05-02 NOTE — Progress Notes (Signed)
PROGRESS NOTE    Lawrence Morgan   UJW:[id number]  DOB: 12/10/1980  DOA: 04/29/2018 PCP: Fleet ContrasAvbuere, Edwin, MD   Brief Narrative:  Lawrence Morgan   is a 37 y.o. male with a history of schizo affective disorder, bipolar disorder.  Patient seen for abdominal pain after ingesting multiple foreign objects earlier this week.  [facility] due to suicidal ideation and attempt by ingesting multiple foreign objects, including batteries and screws.  He was transported to the emergency department today after developing abdominal pain which is mostly in his right upper quadrant.  Pain is nonradiating.  It is improved now.  No other palliating or provoking factors.  Denies rectal bleeding, melena.    Subjective: Some c/o abd distension, no BM's, +flatus.  Diet advanced to solid foods today by gen surg.     Assessment & Plan:     Ingestion of foreign body, initial encounter - EGD showed stone in stomach- unable to be removed with various different methods - Gen surgery consulted- they recommended to not intervene unless issues arise  - pt is tolerating clears and has been advanced to regular diet today; per gen surg if he is able to tolerate a regular diet [facility] - GI signed off, they attempted and were unable to remove the stomach stone w/ endoscopy - other items in colon are expecting to be discharged in the stool  - will get labs in am    Schizoaffective disorder, bipolar type/ Suicide ideation - psych consult done, they recommended inpatient admission and other rec's as below:  -Continue 1:1 sitter for safety -Change/ increase Lithium carbonate to Lithobid CR 450 mg bid due to recurrent suicidal thoughts. -Continue Clozaril 200 mg at bedtime for schizoaffective disorder. -Continue Zoloft 100 mg daily for PTSD -Continue Benztropine and Atomoxetine. - cont other meds - have consulted social work for psych admit when medically stable    DVT  prophylaxis: SCDS Code Status: full code Family Communication: none here today Disposition Plan: will need psych inpatient transfer when ready per gen surg   Vinson Moselleob  MD [facility] pgr [phone] 10/23/2017, 9:25 AM   Consultants:   GI  Gen surgery  Psych  Procedures:   EGD Antimicrobials:  Anti-infectives (From admission, onward)   None     Objective: Vitals:   05/01/18 0519 05/01/18 1553 05/02/18 0526 05/02/18 1517  BP: 121/75 117/71 (!) 97/46 116/66  Pulse: 74 85 73 98  Resp: 18 18  14   Temp: 98.1 F (36.7 C) 97.7 F (36.5 C) 98.9 F (37.2 C) 98.7 F (37.1 C)  TempSrc: Oral Oral Axillary Oral  SpO2: 100% 100% 100% 100%  Weight:      Height:        Intake/Output Summary (Last 24 hours) at 05/02/2018 1801 Last data filed at 05/02/2018 1530 Gross per 24 hour  Intake 1960 ml  Output -  Net 1960 ml   Filed Weights   04/29/18 1046  Weight: 79.4 kg    Examination: General exam: Appears comfortable  HEENT: PERRLA, oral mucosa moist, no sclera icterus or thrush Respiratory system: Clear to auscultation. Respiratory effort normal. Cardiovascular system: S1 & S2 heard, RRR.   Gastrointestinal system: Abdomen soft, non-tender, nondistended. Normal bowel sounds. Central nervous system: Alert and oriented. No focal neurological deficits. Extremities: No cyanosis, clubbing or edema Skin: No rashes or ulcers Psychiatry:  Mood & affect appropriate.   Data Reviewed: I have personally reviewed  following labs and imaging studies  CBC: Recent Labs  Lab 04/29/18 1053  WBC 4.5  NEUTROABS 2.8  HGB 12.5*  HCT 41.7  MCV 89.9  PLT 222   Basic Metabolic Panel: Recent Labs  Lab 04/29/18 1053  NA 138  K 4.0  CL 105  CO2 27  GLUCOSE 78  BUN 15  CREATININE 1.17  CALCIUM 9.5   GFR: Estimated Creatinine Clearance: 87.5 mL/min (by C-G formula based on SCr of 1.17 mg/dL). Liver Function Tests: Recent Labs  Lab 04/29/18 1053  AST 16    ALT 13  ALKPHOS 64  BILITOT 0.6  PROT 6.4*  ALBUMIN 3.8   No results for input(s): LIPASE, AMYLASE in the last 168 hours. No results for input(s): AMMONIA in the last 168 hours. Coagulation Profile: No results for input(s): INR, PROTIME in the last 168 hours. Cardiac Enzymes: No results for input(s): CKTOTAL, CKMB, CKMBINDEX, TROPONINI in the last 168 hours. BNP (last 3 results) No results for input(s): PROBNP in the last 8760 hours. HbA1C: No results for input(s): HGBA1C in the last 72 hours. CBG: No results for input(s): GLUCAP in the last 168 hours. Lipid Profile: No results for input(s): CHOL, HDL, LDLCALC, TRIG, CHOLHDL, LDLDIRECT in the last 72 hours. Thyroid Function Tests: No results for input(s): TSH, T4TOTAL, FREET4, T3FREE, THYROIDAB in the last 72 hours. Anemia Panel: No results for input(s): VITAMINB12, FOLATE, FERRITIN, TIBC, IRON, RETICCTPCT in the last 72 hours. Urine analysis:    Component Value Date/Time   COLORURINE YELLOW 07/14/2008 0144   APPEARANCEUR CLOUDY (A) 07/14/2008 0144   LABSPEC 1.029 07/14/2008 0144   PHURINE 6.0 07/14/2008 0144   GLUCOSEU NEGATIVE 07/14/2008 0144   HGBUR NEGATIVE 07/14/2008 0144   BILIRUBINUR NEGATIVE 07/14/2008 0144   KETONESUR TRACE (A) 07/14/2008 0144   PROTEINUR NEGATIVE 07/14/2008 0144   UROBILINOGEN 1.0 07/14/2008 0144   NITRITE NEGATIVE 07/14/2008 0144   LEUKOCYTESUR  07/14/2008 0144    NEGATIVE MICROSCOPIC NOT DONE ON URINES WITH NEGATIVE PROTEIN, BLOOD, LEUKOCYTES, NITRITE, OR GLUCOSE <1000 mg/dL.   Sepsis Labs: @LABRCNTIP (procalcitonin:4,lacticidven:4) )No results found for this or any previous visit (from the past 240 hour(s)).    Radiology Studies: No results found.    Scheduled Meds: . atomoxetine  25 mg Oral Daily  . benztropine  0.5 mg Oral Daily  . cloZAPine  200 mg Oral QHS  . lithium carbonate  450 mg Oral Q12H  . loratadine  10 mg Oral Daily  . polyethylene glycol  17 g Oral Daily  .  sertraline  100 mg Oral Daily   Continuous Infusions:    LOS: 1 day

## 2018-05-02 NOTE — Progress Notes (Signed)
[facility] Progress Note  2 Days Post-Op  Subjective: CC: foreign body in stomach Patient reports some abdominal pain but not severe. Passing flatus. Some distention. Patient reports he wants surgery.   Objective: Vital signs in last 24 hours: Temp:  [97.7 F (36.5 C)-98.9 F (37.2 C)] 98.9 F (37.2 C) (12/03 0526) Pulse Rate:  [73-85] 73 (12/03 0526) Resp:  [18] 18 (12/02 1553) BP: (97-117)/(46-71) 97/46 (12/03 0526) SpO2:  [100 %] 100 % (12/03 0526) Last BM Date: 04/30/18  Intake/Output from previous day: 12/02 0701 - 12/03 0700 In: 2110 [P.O.:2110] Out: -  Intake/Output this shift: Total I/O In: 340 [P.O.:240; Other:100] Out: -   PE: Gen:  Alert, NAD, pleasant Card:  Regular rate and rhythm Pulm:  Normal effort, clear to auscultation bilaterally Abd: Soft, non-tender with palpation, mildly distended, bowel sounds present  Skin: warm and dry, no rashes    Lab Results:  Recent Labs    04/29/18 1053  WBC 4.5  HGB 12.5*  HCT 41.7  PLT 222   BMET Recent Labs    04/29/18 1053  NA 138  K 4.0  CL 105  CO2 27  GLUCOSE 78  BUN 15  CREATININE 1.17  CALCIUM 9.5   PT/INR No results for input(s): LABPROT, INR in the last 72 hours. CMP     Component Value Date/Time   NA 138 04/29/2018 1053   K 4.0 04/29/2018 1053   CL 105 04/29/2018 1053   CO2 27 04/29/2018 1053   GLUCOSE 78 04/29/2018 1053   BUN 15 04/29/2018 1053   CREATININE 1.17 04/29/2018 1053   CALCIUM 9.5 04/29/2018 1053   PROT 6.4 (L) 04/29/2018 1053   ALBUMIN 3.8 04/29/2018 1053   AST 16 04/29/2018 1053   ALT 13 04/29/2018 1053   ALKPHOS 64 04/29/2018 1053   BILITOT 0.6 04/29/2018 1053   GFRNONAA >60 04/29/2018 1053   GFRAA >60 04/29/2018 1053   Lipase  No results found for: LIPASE     Studies/Results: No results found.  Anti-infectives: Anti-infectives (From admission, onward)   None       Assessment/Plan Schizoaffective disorder Suicidal  ideation  Foreign body in the stomach - swallowed a river rock - GI signing off - patient is not showing obstructive symptoms so would not recommend urgent/emergent surgical intervention  - recommend diet advancement - [facility] if tolerating regular diet   FEN: reg diet  VTE: SCDs ID: no current abx  Plan[facility] tolerating reg diet  LOS: 1 day    Wells GuilesKelly Rayburn , [facility] LPA-C [facility] 05/02/2018, 10:42 AM Pager: [phone] Consults: [phone] Mon-Fri 7:00 am-4:30 pm Sat-Sun 7:00 am-11:30 am

## 2018-05-03 DIAGNOSIS — T1491XA Suicide attempt, initial encounter: Secondary | ICD-10-CM

## 2018-05-03 LAB — COMPREHENSIVE METABOLIC PANEL
ALK PHOS: 67 U/L (ref 38–126)
ALT: 16 U/L (ref 0–44)
AST: 24 U/L (ref 15–41)
Albumin: 3.5 g/dL (ref 3.5–5.0)
Anion gap: 10 (ref 5–15)
BILIRUBIN TOTAL: 0.3 mg/dL (ref 0.3–1.2)
BUN: 12 mg/dL (ref 6–20)
CO2: 25 mmol/L (ref 22–32)
CREATININE: 0.96 mg/dL (ref 0.61–1.24)
Calcium: 9.4 mg/dL (ref 8.9–10.3)
Chloride: 106 mmol/L (ref 98–111)
GFR calc Af Amer: >60 mL/min (ref >60)
GFR calc non Af Amer: >60 mL/min (ref >60)
Glucose, Bld: 103 mg/dL — ABNORMAL HIGH (ref 70–99)
Potassium: 4.2 mmol/L (ref 3.5–5.1)
Sodium: 141 mmol/L (ref 135–145)
Total Protein: 6 g/dL — ABNORMAL LOW (ref 6.5–8.1)

## 2018-05-03 LAB — CBC
HCT: 39.7 % (ref 39.0–52.0)
Hemoglobin: 11.8 g/dL — ABNORMAL LOW (ref 13.0–17.0)
MCH: 26.2 pg (ref 26.0–34.0)
MCHC: 29.7 g/dL — ABNORMAL LOW (ref 30.0–36.0)
MCV: 88 fL (ref 80.0–100.0)
Platelets: 226 10*3/uL (ref 150–400)
RBC: 4.51 MIL/uL (ref 4.22–5.81)
RDW: 13.3 % (ref 11.5–15.5)
WBC: 5 10*3/uL (ref 4.0–10.5)
nRBC: 0 % (ref 0.0–0.2)

## 2018-05-03 MED ORDER — PANTOPRAZOLE SODIUM 40 MG PO TBEC
40.0000 mg | DELAYED_RELEASE_TABLET | Freq: Every day | ORAL | Status: DC
  Administered 2018-05-03 – 2018-05-10 (×8): 40 mg via ORAL
  Filled 2018-05-03 (×8): qty 1

## 2018-05-03 MED ORDER — SENNOSIDES-DOCUSATE SODIUM 8.6-50 MG PO TABS
2.0000 | ORAL_TABLET | Freq: Two times a day (BID) | ORAL | Status: DC
  Administered 2018-05-03 – 2018-05-10 (×14): 2 via ORAL
  Filled 2018-05-03 (×14): qty 2

## 2018-05-03 MED ORDER — DOCUSATE SODIUM 100 MG PO CAPS
100.0000 mg | ORAL_CAPSULE | Freq: Two times a day (BID) | ORAL | Status: DC
  Administered 2018-05-03: 100 mg via ORAL
  Filled 2018-05-03: qty 1

## 2018-05-03 MED ORDER — CLOZAPINE 25 MG PO TABS
250.0000 mg | ORAL_TABLET | Freq: Every day | ORAL | Status: DC
  Administered 2018-05-03 – 2018-05-09 (×7): 250 mg via ORAL
  Filled 2018-05-03 (×8): qty 2

## 2018-05-03 NOTE — Progress Notes (Addendum)
Patient Demographics:    Lawrence Morgan, is a 37 y.o. male, DOB - Mar 26, 1981, ZOX:[id number]  Admit date - 04/29/2018   Admitting Physician No admitting provider for patient encounter.  Outpatient Primary MD for the patient is Fleet Contras, MD  LOS - 2   Chief Complaint  Patient presents with  . Medical Clearance  . Suicidal        Subjective:    Lawrence Morgan today has no fevers, no emesis,  No chest pain, patient continues to verbalize auditory hallucinations telling him to steal and swallow things  Assessment  & Plan :    Principal Problem:   Schizoaffective disorder, bipolar type (HCC) Active Problems:   Ingestion of foreign body, initial encounter   PTSD (post-traumatic stress disorder)   Schizo-affective schizophrenia (HCC)   Suicide ideation  Brief Narrative:  Lawrence Morgan  is a 37 y.o.malewith a history of schizo affective disorder, bipolar disorder. Patient seen for abdominal pain after ingesting multiple foreign objects earlier this week. Patient has been at [facility] facility due to suicidal ideation and attempt by ingesting multiple foreign objects, including batteries and screws. He was transported to the emergency department today after developing abdominal pain which is mostly in his right upper quadrant. Pain is nonradiating. It is improved now. No other palliating or provoking factors. Denies rectal bleeding, melena. Patient is medically cleared/ready for transfer to inpatient psychiatric unit when bed is available     Plan   1)Ingestion of foreign body, initial encounter - EGD showed stone in stomach- unable to be removed with various different methods, - Gen surgery consulted- they recommended to not intervene unless issues arise , - GI signed off, they attempted and were unable to remove the stomach stone w/ endoscopy - other items in colon are expecting to be  discharged in the stool   General surgery signing off as of 05/03/2018... Tolerating solid food well  2) Schizoaffective disorder, bipolar type/ Suicide ideation - psych consult done, they recommended inpatient admission and other rec's as below, patient was reevaluated by Dr. Roosvelt Harps the staff psychiatrist on 05/03/2018  -Continue 1:1 sitter for safety -Change/ increase Lithium carbonate to Lithobid CR 450 mg bid due to recurrent suicidal thoughts. -Continue Clozaril 250 mg at bedtime for schizoaffective disorder. -Continue Zoloft 100 mg daily for PTSD -Continue Benztropine and Atomoxetine. - cont other meds -Patient will need inpatient psychiatric placement, discussed with pharmacy staff, trying to verify if patient was getting monthly Invega injections prior to admission at his group home  3)Kliptomania----patient tells me the voices tell him to steal   Disposition/Need for in-Hospital Stay- patient unable to be discharged at this time due to awaiting inpatient psychiatric placement, unable to go to [facility] due to history of mild intellectual disability.... Patient is medically cleared/ready for transfer to inpatient psychiatric unit when bed is available  Code Status : full   Disposition Plan  : Inpatient psychiatric unit  Consults  : Psychiatry/GI/general surgery  DVT Prophylaxis  :   - SCDs  Lab Results  Component Value Date   PLT 226 05/03/2018    Inpatient Medications  Scheduled Meds: . atomoxetine  25 mg Oral Daily  . benztropine  0.5 mg Oral Daily  . cloZAPine  200 mg Oral QHS  . docusate sodium  100 mg Oral BID  . lithium carbonate  450 mg Oral Q12H  . loratadine  10 mg Oral Daily  . pantoprazole  40 mg Oral Daily  . polyethylene glycol  17 g Oral Daily  . sertraline  100 mg Oral Daily   Continuous Infusions: PRN Meds:.acetaminophen **OR** acetaminophen, albuterol, hydrOXYzine, ondansetron **OR** ondansetron (ZOFRAN) IV, polyethylene  glycol    Anti-infectives (From admission, onward)   None        Objective:   Vitals:   05/02/18 2016 05/02/18 2221 05/03/18 0729 05/03/18 1248  BP: 123/71 139/77 (!) 106/59 113/68  Pulse: 82 (!) 102 75 93  Resp:   17   Temp: 99 F (37.2 C)  98.5 F (36.9 C) 98.6 F (37 C)  TempSrc: Oral  Oral Oral  SpO2: 100%  100% 99%  Weight:      Height:        Wt Readings from Last 3 Encounters:  04/29/18 79.4 kg  02/02/17 81.6 kg     Intake/Output Summary (Last 24 hours) at 05/03/2018 1631 Last data filed at 05/03/2018 1100 Gross per 24 hour  Intake 840 ml  Output -  Net 840 ml     Physical Exam Patient is examined daily including today on 05/03/18 , exams remain the same as of yesterday except that has changed   Gen:- Awake Alert,  In no apparent distress,  HEENT:- [place].AT, No sclera icterus Neck-Supple Neck,No JVD,.  Lungs-  CTAB , fair symmetrical air movement CV- S1, S2 normal, regular  Abd-  +ve B.Sounds, Abd Soft, No tenderness,    Extremity/Skin:- No  edema, pedal pulses present  Psych-affect is flat, oriented x3 Neuro-no new focal deficits, no tremors   Data Review:   Micro Results No results found for this or any previous visit (from the past 240 hour(s)).  Radiology Reports Ct Abdomen Pelvis W Contrast  Result Date: 04/29/2018 CLINICAL DATA:  abd pain and nausea. Pt reports swallowing 20- AA batteries x2-4 weeks. [facility] at [facility]. [facility]. Pt IVC'd today due to SI. Pt has hx of schzo effective. EXAM: CT ABDOMEN AND PELVIS WITH CONTRAST TECHNIQUE: Multidetector CT imaging of the abdomen and pelvis was performed using the standard protocol following bolus administration of intravenous contrast. CONTRAST:  100mL OMNIPAQUE IOHEXOL 300 MG/ML  SOLN COMPARISON:  Radiographs from earlier the same day FINDINGS: Lower chest: Trace pleural effusions. No pericardial effusion. Minimal dependent atelectasis posteriorly  at the left lung base. Hepatobiliary: No focal liver abnormality is seen. No gallstones, gallbladder wall thickening, or biliary dilatation. Pancreas: Unremarkable. No pancreatic ductal dilatation or surrounding inflammatory changes. Spleen: Normal in size without focal abnormality. Adrenals/Urinary Tract: Normal adrenals. Normal kidneys. Urinary bladder incompletely distended, unremarkable. Stomach/Bowel: Stomach is nondistended. There is a 3.7 cm cm oval smoothly marginated foreign body in the gastric antrum. Small bowel is decompressed. Moderate colonic fecal material. No dilatation or wall thickening. Multiple intraluminal foreign bodies. Probable AA battery in the cecum, clustered with an adjacent screw and nonspecific 2.2 cm smoothly marginated metallic foreign body. Probable AA battery in the hepatic flexure. Metallic screw and separate 2.2 cm coin shaped metallic density in the distal transverse colon. Probable AA battery in the mid descending colon. Vascular/Lymphatic: No significant vascular findings are present. No enlarged abdominal or pelvic lymph nodes. Reproductive: Prostate is unremarkable. Other: No ascites.  Left pelvic phleboliths.  No free air.  Musculoskeletal: No acute or significant osseous findings. IMPRESSION: 1. Multiple foreign bodies in the stomach and colon as detailed above, without evidence of perforation, obstruction, regional inflammatory change, or bowel wall thickening. Electronically Signed   By: Corlis Leak M.D.   On: 04/29/2018 12:31   Dg Abdomen Acute W/chest  Result Date: 04/29/2018 CLINICAL DATA:  Foreign body ingestion EXAM: DG ABDOMEN ACUTE W/ 1V CHEST COMPARISON:  04/27/08 FINDINGS: No abnormally dilated loops of bowel identified. There are multiple metallic radiopaque foreign bodies identified within the abdomen. Within the right lower quadrant of the abdomen there are approximately 5 foreign bodies including a screw, 2 batteries, and several discoid foreign bodies  which may represent coins. In the left abdomen there are 3 metallic foreign bodies including a screw, a battery and presumed coin. Within the central abdomen there is a less dense, round foreign body which measures approximately 4.2 cm. Heart size appears normal. No pleural effusion. The lungs are clear. IMPRESSION: Multiple radiodense foreign bodies are identified in the distribution of the ascending colon, transverse colon and descending colon. Foreign bodies include several batteries, screws and possibly several coring. No evidence to suggest bowel obstruction. Electronically Signed   By: Signa Kell M.D.   On: 04/29/2018 11:36     CBC Recent Labs  Lab 04/29/18 1053 05/03/18 0309  WBC 4.5 5.0  HGB 12.5* 11.8*  HCT 41.7 39.7  PLT 222 226  MCV 89.9 88.0  MCH 26.9 26.2  MCHC 30.0 29.7*  RDW 13.5 13.3  LYMPHSABS 1.1  --   MONOABS 0.5  --   EOSABS 0.0  --   BASOSABS 0.0  --     Chemistries  Recent Labs  Lab 04/29/18 1053 05/03/18 0309  NA 138 141  K 4.0 4.2  CL 105 106  CO2 27 25  GLUCOSE 78 103*  BUN 15 12  CREATININE 1.17 0.96  CALCIUM 9.5 9.4  AST 16 24  ALT 13 16  ALKPHOS 64 67  BILITOT 0.6 0.3   Shon Hale M.D on 05/03/2018 at 4:31 PM  Pager---[phone] Go to [email or web address] - password [email or web address] for contact info  [facility] - Office  [phone]

## 2018-05-03 NOTE — Consult Note (Addendum)
[facility] Psych Consult Progress Note  05/03/2018 2:13 PM Lawrence Morgan  MRN:  [id number] Subjective:   Per chart review, patient was admitted with abdominal pain after ingesting multiple foreign bodies. He did not require surgery. His diet has been advanced to a regular diet. He is from a group home. He has a history of schizoaffective disorder and kleptomania. He was last seen on 12/1 by the psychiatry consult service for intentional ingestion of multiple foreign objects due to [facility] to harm self. He endorsed ongoing CAH with thoughts to harm self. He was recommended for inpatient psychiatric hospitalization.  His home medications were continued.   On interview, Lawrence Morgan reports that he continues to hear voices telling him to harm himself.  He reports that he has been hearing these voices for 6 months.  He decided to recently act on them because he was tired of hearing them.  He reports a history of kleptomania.  He worked at [employer].  He reports that he stole goods from his job so he was fired.  He denies a history of legal charges.  He reports a history of intentionally ingesting foreign bodies due to auditory hallucinations.  He reports ongoing depression in the setting of losing "58 family members" in the past 15 years.  He reports that his mother died a year ago.  He identifies his current group home as a stressor.  He reports "They yell a lot there"  He endorses SI.  He denies HI.  He denies problems with his sleep or appetite.  He has been compliant with his medications.  He reports that he also receives a monthly Invega injection.  He reports that his father is his legal guardian.  Principal Problem: Schizoaffective disorder, bipolar type (HCC) Diagnosis:  Principal Problem:   Schizoaffective disorder, bipolar type (HCC) Active Problems:   Ingestion of foreign body, initial encounter   PTSD (post-traumatic stress disorder)   Schizo-affective schizophrenia (HCC)   Suicide ideation  Total  Time spent with patient: 15 minutes  Past Psychiatric History: Schizoaffective disorder, bipolar type, IDD, PTSD, personality disorder and marijuana abuse.   Past Medical History:  Past Medical History:  Diagnosis Date  . Intellectual disability    mild  . Marijuana use   . Personality disorder (HCC)   . PTSD (post-traumatic stress disorder)   . 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 Schizoaffective disorder, bipolar type [facility])     Past Surgical History:  Procedure Laterality Date  . ESOPHAGOGASTRODUODENOSCOPY (EGD) WITH PROPOFOL N/A 04/30/2018   Procedure: ESOPHAGOGASTRODUODENOSCOPY (EGD) WITH PROPOFOL;  Surgeon: Kathi Der, MD;  Location: [facility] ENDOSCOPY;  Service: Gastroenterology;  Laterality: N/A;  . FOREIGN BODY REMOVAL N/A 04/30/2018   Procedure: FOREIGN BODY REMOVAL;  Surgeon: Kathi Der, MD;  Location: [facility] ENDOSCOPY;  Service: Gastroenterology;  Laterality: N/A;  . FRACTURE SURGERY      r ankle    Family History: History reviewed. No pertinent family history. Family Psychiatric  History: None per chart review.  Social History:  Social History   Substance and Sexual Activity  Alcohol Use Not Currently     Social History   Substance and Sexual Activity  Drug Use Yes  . Types: Marijuana    Social History   Socioeconomic History  . Marital status: Single    Spouse name: Not on file  . Number of children: Not on file  . Years of education: Not on file  . Highest education level: Not on file  Occupational History  .  Not on file  Social Needs  . Financial resource strain: Not on file  . Food insecurity:    Worry: Not on file    Inability: Not on file  . Transportation needs:    Medical: Not on file    Non-medical: Not on file  Tobacco Use  . Smoking status: Current Every Day Smoker    Types: Cigars  . Smokeless tobacco: Never Used  Substance and Sexual Activity  . Alcohol use: Not Currently  . Drug use: Yes    Types: Marijuana  .  Sexual activity: Not on file  Lifestyle  . Physical activity:    Days per week: Not on file    Minutes per session: Not on file  . Stress: Not on file  Relationships  . Social connections:    Talks on phone: Not on file    Gets together: Not on file    Attends religious service: Not on file    Active member of club or organization: Not on file    Attends meetings of clubs or organizations: Not on file    Relationship status: Not on file  Other Topics Concern  . Not on file  Social History Narrative  . Not on file    Sleep: Good  Appetite:  Good  Current Medications: Current Facility-Administered Medications  Medication Dose Route Frequency Provider Last Rate Last Dose  . acetaminophen (TYLENOL) tablet 650 mg  650 mg Oral Q6H PRN Levie Heritage, DO   650 mg at 05/02/18 1610   Or  . acetaminophen (TYLENOL) suppository 650 mg  650 mg Rectal Q6H PRN Levie Heritage, DO      . albuterol (PROVENTIL) (2.5 MG/3ML) 0.083% nebulizer solution 2.5 mg  2.5 mg Nebulization Q6H PRN Levie Heritage, DO      . atomoxetine (STRATTERA) capsule 25 mg  25 mg Oral Daily Levie Heritage, DO   25 mg at 05/03/18 1011  . benztropine (COGENTIN) tablet 0.5 mg  0.5 mg Oral Daily Levie Heritage, DO   0.5 mg at 05/03/18 1011  . cloZAPine (CLOZARIL) tablet 200 mg  200 mg Oral QHS Levie Heritage, DO   200 mg at 05/02/18 2100  . docusate sodium (COLACE) capsule 100 mg  100 mg Oral BID Rayburn, Kelly A, PA-C   100 mg at 05/03/18 1011  . hydrOXYzine (ATARAX/VISTARIL) tablet 25 mg  25 mg Oral TID PRN Levie Heritage, DO      . lithium carbonate (ESKALITH) CR tablet 450 mg  450 mg Oral Q12H Akintayo, Mojeed, MD   450 mg at 05/03/18 1010  . loratadine (CLARITIN) tablet 10 mg  10 mg Oral Daily Levie Heritage, DO   10 mg at 05/03/18 1011  . ondansetron (ZOFRAN) tablet 4 mg  4 mg Oral Q6H PRN Levie Heritage, DO       Or  . ondansetron [facility]) injection 4 mg  4 mg Intravenous Q6H PRN Levie Heritage, DO       . pantoprazole (PROTONIX) EC tablet 40 mg  40 mg Oral Daily Rayburn, Kelly A, PA-C   40 mg at 05/03/18 1011  . polyethylene glycol (MIRALAX / GLYCOLAX) packet 17 g  17 g Oral Daily Levie Heritage, DO   17 g at 05/03/18 1009  . polyethylene glycol (MIRALAX / GLYCOLAX) packet 17 g  17 g Oral Daily PRN Levie Heritage, DO      . sertraline (ZOLOFT) tablet 100 mg  100 mg Oral Daily Levie HeritageStinson, Jacob J, DO   100 mg at 05/03/18 1011    Lab Results:  Results for orders placed or performed during the hospital encounter of 04/29/18 (from the past 48 hour(s))  CBC     Status: Abnormal   Collection Time: 05/03/18  3:09 AM  Result Value Ref Range   WBC 5.0 4.0 - 10.5 K/uL   RBC 4.51 4.22 - 5.81 MIL/uL   Hemoglobin 11.8 (L) 13.0 - 17.0 g/dL   HCT 28.439.7 13.239.0 - 44.052.0 %   MCV 88.0 80.0 - 100.0 fL   MCH 26.2 26.0 - 34.0 pg   MCHC 29.7 (L) 30.0 - 36.0 g/dL   RDW 10.213.3 72.511.5 - 36.615.5 %   Platelets 226 150 - 400 K/uL   nRBC 0.0 0.0 - 0.2 %    Comment: Performed at [facility], [address] [place], [place] [address]  Comprehensive metabolic panel     Status: Abnormal   Collection Time: 05/03/18  3:09 AM  Result Value Ref Range   Sodium 141 135 - 145 mmol/L   Potassium 4.2 3.5 - 5.1 mmol/L   Chloride 106 98 - 111 mmol/L   CO2 25 22 - 32 mmol/L   Glucose, Bld 103 (H) 70 - 99 mg/dL   BUN 12 6 - 20 mg/dL   Creatinine, Ser 7.420.96 0.61 - 1.24 mg/dL   Calcium 9.4 8.9 - 59.510.3 mg/dL   Total Protein 6.0 (L) 6.5 - 8.1 g/dL   Albumin 3.5 3.5 - 5.0 g/dL   AST 24 15 - 41 U/L   ALT 16 0 - 44 U/L   Alkaline Phosphatase 67 38 - 126 U/L   Total Bilirubin 0.3 0.3 - 1.2 mg/dL   GFR calc non Af Amer >60 >60 mL/min   GFR calc Af Amer >60 >60 mL/min   Anion gap 10 5 - 15    Comment: Performed at [facility], [address] [place], [place] [address]    Blood Alcohol level:  Lab Results  Component Value Date   ETH <10 04/29/2018   ETH  07/14/2008    6        LOWEST DETECTABLE LIMIT FOR SERUM  ALCOHOL IS 5 mg/dL FOR MEDICAL PURPOSES ONLY    Musculoskeletal: Strength & Muscle Tone: within normal limits Gait & Station: UTA since lying on a couch. Patient leans: N/A  Psychiatric Specialty Exam: Physical Exam  Nursing note and vitals reviewed. Constitutional: He is oriented to person, place, and time. He appears well-developed and well-nourished.  HENT:  Head: Normocephalic and atraumatic.  Neck: Normal range of motion.  Respiratory: Effort normal.  Musculoskeletal: Normal range of motion.  Neurological: He is alert and oriented to person, place, and time.  Psychiatric: His speech is normal and behavior is normal. Cognition and memory are normal. He expresses impulsivity. He exhibits a depressed mood. He expresses suicidal ideation.    Review of Systems  Cardiovascular: Negative for chest pain.  Gastrointestinal: Negative for abdominal pain, constipation, diarrhea, nausea and vomiting.  Psychiatric/Behavioral: Positive for depression, hallucinations (CAH) and suicidal ideas.  All other systems reviewed and are negative.   Blood pressure 113/68, pulse 93, temperature 98.6 F (37 C), temperature source Oral, resp. rate 17, height 5\' 10"  (1.778 m), weight 79.4 kg, SpO2 99 %.Body mass index is 25.11 kg/m.  General Appearance: Fairly Groomed, young, African American male, wearing paper hospital scrubs with a beard and corrective lenses who is sitting  on a couch. NAD.   Eye Contact:  Good  Speech:  Clear and Coherent and Normal Rate  Volume:  Normal  Mood:  Depressed  Affect:  Constricted  Thought Process:  Goal Directed, Linear and Descriptions of Associations: Intact  Orientation:  Full (Time, Place, and Person)  Thought Content:  Hallucinations: Command:  to harm self.  Suicidal Thoughts:  Yes.  without intent/plan  Homicidal Thoughts:  No  Memory:  Immediate;   Good Recent;   Good Remote;   Good  Judgement:  Poor  Insight:  Fair  Psychomotor Activity:  Normal   Concentration:  Concentration: Good and Attention Span: Good  Recall:  Good  Fund of Knowledge:  Fair  Language:  Fair  Akathisia:  No  Handed:  Right  AIMS (if indicated):   N/A  Assets:  Communication Skills Desire for Improvement Financial Resources/Insurance Housing Physical Health  ADL's:  Intact  Cognition:  WNL  Sleep:   Okay   Assessment:  Lawrence Morgan is a 37 y.o. male who was admitted with abdominal pain after ingesting multiple foreign bodies due to [facility]. He continues to endorse CAH to harm self. He has a history of IDD and likely has poor stress tolerance. He identifies his current group home as a stressor and reports depression. He endorses SI. He is unable to safety plan. He warrants inpatient psychiatric hospitalization for stabilization and treatment. Recommend increasing Clozaril for psychosis.    Treatment Plan Summary: -Increase Clozaril 200 mg qhs to 250 mg qhs for psychosis. -Continue home medications: Strattera 25 mg daily for attention, Cogentin 0.5 mg daily for EPS prophylaxis, Lithium CR 450 mg BID for mood stabilization and Zoloft 100 mg daily for mood.  -Patient reports receiving a monthly Invega injection. Primary team informed to confirm dosing of medication since patient does not know when his next injection is due.  -EKG reviewed and QTc 441 on 11/30. Please closely monitor when starting or increasing QTc prolonging agents.  -Patient continues to warrant inpatient psychiatric hospitalization given high risk of harm to self. -Continue bedside sitter.  -Please pursue involuntary commitment if patient refuses voluntary psychiatric hospitalization or attempts to leave the hospital.  -Will sign off on patient at this time. Please consult psychiatry again as needed.       Cherly Beach, DO 05/03/2018, 2:13 PM

## 2018-05-03 NOTE — Social Work (Addendum)
1:49pm- Spoke with Inetta Fermoina, pt screened as not appropriate for [facility] as he has a documented "intellectual disability - mild" on his pt history. Requested a new psychiatry consult as it has been more than 48 hrs since previous screening and spoke with Heriberto AntiguaJean Sutter, CSW regarding obtaining a list of more appropriate facilities.   10:25am-[facility].  She will review pt and available beds for possible admission.  [facility] by surgical team.  Octavio GravesIsabel , MSW, [facility] [place] Clinical Social Work [phone]

## 2018-05-03 NOTE — Final Consult Note (Signed)
[facility] Progress Note  3 Days Post-Op  Subjective: CC: foreign body in stomach Patient denies pain. Tolerating diet and eating 75% of meals. Has not had a BM but is passing gas. Distention improving. Takes miralax at home. Patient still wants surgery, but explained once again that I do not see any indications for surgical intervention at this time. Reports he had a nosebleed last night.   Objective: Vital signs in last 24 hours: Temp:  [98.5 F (36.9 C)-99 F (37.2 C)] 98.5 F (36.9 C) (12/04 0729) Pulse Rate:  [70-102] 75 (12/04 0729) Resp:  [14-17] 17 (12/04 0729) BP: (106-139)/(59-77) 106/59 (12/04 0729) SpO2:  [100 %] 100 % (12/04 0729) Last BM Date: 04/30/18  Intake/Output from previous day: 12/03 0701 - 12/04 0700 In: 1420 [P.O.:1320] Out: -  Intake/Output this shift: No intake/output data recorded.  PE: Gen:  Alert, NAD, pleasant Card:  Regular rate and rhythm Pulm:  Normal effort, clear to auscultation bilaterally Abd: Soft, non-tender, non-distended, bowel sounds present, no HSM Skin: warm and dry, no rashes    Lab Results:  Recent Labs    05/03/18 0309  WBC 5.0  HGB 11.8*  HCT 39.7  PLT 226   BMET Recent Labs    05/03/18 0309  NA 141  K 4.2  CL 106  CO2 25  GLUCOSE 103*  BUN 12  CREATININE 0.96  CALCIUM 9.4   PT/INR No results for input(s): LABPROT, INR in the last 72 hours. CMP     Component Value Date/Time   NA 141 05/03/2018 0309   K 4.2 05/03/2018 0309   CL 106 05/03/2018 0309   CO2 25 05/03/2018 0309   GLUCOSE 103 (H) 05/03/2018 0309   BUN 12 05/03/2018 0309   CREATININE 0.96 05/03/2018 0309   CALCIUM 9.4 05/03/2018 0309   PROT 6.0 (L) 05/03/2018 0309   ALBUMIN 3.5 05/03/2018 0309   AST 24 05/03/2018 0309   ALT 16 05/03/2018 0309   ALKPHOS 67 05/03/2018 0309   BILITOT 0.3 05/03/2018 0309   GFRNONAA >60 05/03/2018 0309   GFRAA >60 05/03/2018 0309   Lipase  No results found for:  LIPASE     Studies/Results: No results found.  Anti-infectives: Anti-infectives (From admission, onward)   None       Assessment/Plan Schizoaffective disorder Suicidal ideation  Foreign body in the stomach- swallowed a river rock -GI signed off - patient is not showing obstructive symptoms so would not recommend urgent/emergent surgical intervention  - tolerating reg diet, laxatives prn (he takes miralax at home) - adding PPI  FEN: reg diet  VTE: SCDs ID: no current abx  Plan: [facility]. Recommend continuing PPI. No surgical intervention warranted unless patient develops obstruction. We will sign off.   LOS: 2 days    Wells GuilesKelly Rayburn , [facility] 05/03/2018, 9:50 AM Pager: [phone] Consults: [phone] Mon-Fri 7:00 am-4:30 pm Sat-Sun 7:00 am-11:30 am

## 2018-05-04 MED ORDER — PALIPERIDONE PALMITATE ER 234 MG/1.5ML IM SUSY
234.0000 mg | PREFILLED_SYRINGE | Freq: Once | INTRAMUSCULAR | Status: AC
  Administered 2018-05-05: 234 mg via INTRAMUSCULAR
  Filled 2018-05-04: qty 1.5

## 2018-05-04 NOTE — Progress Notes (Signed)
   Patient is medically stable for transfer to inpatient psychiatric care when bed available  Shon Haleourage , MD

## 2018-05-04 NOTE — Social Work (Signed)
Pt [facility]  Following for placement- acknowledging medical stability, challenging placement due to pt dx of IDD.  Octavio GravesIsabel , MSW, [facility] [place] Clinical Social Work [phone]

## 2018-05-04 NOTE — Progress Notes (Signed)
Patient Demographics:    Lawrence Morgan, is a 37 y.o. male, DOB - [birth year], AVW:[id number]  Admit date - 04/29/2018   Admitting Physician No admitting provider for patient encounter.  Outpatient Primary MD for the patient is Fleet Contras, MD  LOS - 3   Chief Complaint  Patient presents with  . Medical Clearance  . Suicidal        Subjective:    Lawrence Morgan today has no fevers, no emesis,  No chest pain, patient continues to verbalize auditory hallucinations telling him to steal and swallow things  Assessment  & Plan :    Principal Problem:   Schizoaffective disorder, bipolar type (HCC) Active Problems:   Ingestion of foreign body, initial encounter   PTSD (post-traumatic stress disorder)   Schizo-affective schizophrenia (HCC)   Suicide ideation  Brief Narrative:  Lawrence Morgan  is a 37 y.o.malewith a history of schizo affective disorder, bipolar disorder. Patient seen for abdominal pain after ingesting multiple foreign objects earlier this week. Patient has been at [facility] facility due to suicidal ideation and attempt by ingesting multiple foreign objects, including batteries and screws. He was transported to the emergency department today after developing abdominal pain which is mostly in his right upper quadrant. Pain is nonradiating. It is improved now. No other palliating or provoking factors. Denies rectal bleeding, melena. Patient is medically cleared/ready for transfer to inpatient psychiatric unit when bed is available     Plan   1)Ingestion of foreign body, initial encounter - EGD showed stone in stomach- unable to be removed with various different methods, - Gen surgery consulted- they recommended to not intervene unless issues arise , - GI signed off, they attempted and were unable to remove the stomach stone w/ endoscopy - other items in colon are expecting to be  discharged in the stool   General surgery signing off as of 05/03/2018... Tolerating solid food well  2) Schizoaffective disorder, bipolar type/ Suicide ideation - psych consult done, they recommended inpatient admission and other rec's as below, patient was reevaluated by Dr. Roosvelt Harps the staff psychiatrist on 05/03/2018  -Continue 1:1 sitter for safety -Change/ increase Lithium carbonate to Lithobid CR 450 mg bid due to recurrent suicidal thoughts. -Increased Clozaril 250 mg at bedtime for schizoaffective disorder. -Continue Zoloft 100 mg daily for PTSD -Continue Benztropine and Atomoxetine. - cont other meds -Patient will need inpatient psychiatric placement, discussed with pharmacist, last dose of Invega was 04/06/2018 at patient's group home apparently patient is due for Invega at this time  3)Kliptomania----patient tells me the voices tell him to steal   Disposition/Need for in-Hospital Stay- patient unable to be discharged at this time due to awaiting inpatient psychiatric placement, unable to go to [facility] due to history of mild intellectual disability.... Patient is medically cleared/ready for transfer to inpatient psychiatric unit when bed is available  Code Status : full   Disposition Plan  : Inpatient psychiatric unit  Consults  : Psychiatry/GI/general surgery  DVT Prophylaxis  :   - SCDs  Lab Results  Component Value Date   PLT 226 05/03/2018    Inpatient Medications  Scheduled Meds: . atomoxetine  25 mg Oral Daily  . benztropine  0.5 mg Oral Daily  . cloZAPine  250 mg Oral QHS  . lithium carbonate  450 mg Oral Q12H  . loratadine  10 mg Oral Daily  . [START ON 05/05/2018] paliperidone  234 mg Intramuscular Once  . pantoprazole  40 mg Oral Daily  . polyethylene glycol  17 g Oral Daily  . senna-docusate  2 tablet Oral BID  . sertraline  100 mg Oral Daily   Continuous Infusions: PRN Meds:.acetaminophen **OR** acetaminophen, albuterol, hydrOXYzine, ondansetron  **OR** ondansetron (ZOFRAN) IV, polyethylene glycol    Anti-infectives (From admission, onward)   None        Objective:   Vitals:   05/03/18 1248 05/03/18 2152 05/04/18 0614 05/04/18 1612  BP: 113/68 118/74 (!) 127/96 (!) 106/56  Pulse: 93 80 82 89  Resp:  16 17 17   Temp: 98.6 F (37 C) 98.2 F (36.8 C) 97.7 F (36.5 C) 98.5 F (36.9 C)  TempSrc: Oral Oral Oral Axillary  SpO2: 99% 100% 100% 99%  Weight:      Height:        Wt Readings from Last 3 Encounters:  04/29/18 79.4 kg  02/02/17 81.6 kg     Intake/Output Summary (Last 24 hours) at 05/04/2018 1825 Last data filed at 05/04/2018 1343 Gross per 24 hour  Intake 1610 ml  Output -  Net 1610 ml     Physical Exam Patient is examined daily including today on 05/04/18 , exams remain the same as of yesterday except that has changed   Gen:- Awake Alert,  In no apparent distress,  HEENT:- Lawrence Morgan.AT, No sclera icterus Neck-Supple Neck,No JVD,.  Lungs-  CTAB , fair symmetrical air movement CV- S1, S2 normal, regular  Abd-  +ve B.Sounds, Abd Soft, No tenderness,    Extremity/Skin:- No  edema, pedal pulses present  Psych-affect is flat, oriented x3 Neuro-no new focal deficits, no tremors   Data Review:   Micro Results No results found for this or any previous visit (from the past 240 hour(s)).  Radiology Reports Ct Abdomen Pelvis W Contrast  Result Date: 04/29/2018 CLINICAL DATA:  abd pain and nausea. Pt reports swallowing 20- AA batteries x2-4 weeks. [facility] Pt IVC'd today due to SI. Pt has hx of schzo effective. EXAM: CT ABDOMEN AND PELVIS WITH CONTRAST TECHNIQUE: Multidetector CT imaging of the abdomen and pelvis was performed using the standard protocol following bolus administration of intravenous contrast. CONTRAST:  100mL OMNIPAQUE IOHEXOL 300 MG/ML  SOLN COMPARISON:  Radiographs from earlier the same day FINDINGS: Lower chest: Trace pleural  effusions. No pericardial effusion. Minimal dependent atelectasis posteriorly at the left lung base. Hepatobiliary: No focal liver abnormality is seen. No gallstones, gallbladder wall thickening, or biliary dilatation. Pancreas: Unremarkable. No pancreatic ductal dilatation or surrounding inflammatory changes. Spleen: Normal in size without focal abnormality. Adrenals/Urinary Tract: Normal adrenals. Normal kidneys. Urinary bladder incompletely distended, unremarkable. Stomach/Bowel: Stomach is nondistended. There is a 3.7 cm cm oval smoothly marginated foreign body in the gastric antrum. Small bowel is decompressed. Moderate colonic fecal material. No dilatation or wall thickening. Multiple intraluminal foreign bodies. Probable AA battery in the cecum, clustered with an adjacent screw and nonspecific 2.2 cm smoothly marginated metallic foreign body. Probable AA battery in the hepatic flexure. Metallic screw and separate 2.2 cm coin shaped metallic density in the distal transverse colon. Probable AA battery in the mid descending colon. Vascular/Lymphatic: No significant vascular findings are present. No enlarged abdominal or pelvic lymph nodes. Reproductive: Prostate  is unremarkable. Other: No ascites.  Left pelvic phleboliths.  No free air. Musculoskeletal: No acute or significant osseous findings. IMPRESSION: 1. Multiple foreign bodies in the stomach and colon as detailed above, without evidence of perforation, obstruction, regional inflammatory change, or bowel wall thickening. Electronically Signed   By: Corlis Leak M.D.   On: 04/29/2018 12:31   Dg Abdomen Acute W/chest  Result Date: 04/29/2018 CLINICAL DATA:  Foreign body ingestion EXAM: DG ABDOMEN ACUTE W/ 1V CHEST COMPARISON:  04/27/08 FINDINGS: No abnormally dilated loops of bowel identified. There are multiple metallic radiopaque foreign bodies identified within the abdomen. Within the right lower quadrant of the abdomen there are approximately 5 foreign  bodies including a screw, 2 batteries, and several discoid foreign bodies which may represent coins. In the left abdomen there are 3 metallic foreign bodies including a screw, a battery and presumed coin. Within the central abdomen there is a less dense, round foreign body which measures approximately 4.2 cm. Heart size appears normal. No pleural effusion. The lungs are clear. IMPRESSION: Multiple radiodense foreign bodies are identified in the distribution of the ascending colon, transverse colon and descending colon. Foreign bodies include several batteries, screws and possibly several coring. No evidence to suggest bowel obstruction. Electronically Signed   By: Signa Kell M.D.   On: 04/29/2018 11:36     CBC Recent Labs  Lab 04/29/18 1053 05/03/18 0309  WBC 4.5 5.0  HGB 12.5* 11.8*  HCT 41.7 39.7  PLT 222 226  MCV 89.9 88.0  MCH 26.9 26.2  MCHC 30.0 29.7*  RDW 13.5 13.3  LYMPHSABS 1.1  --   MONOABS 0.5  --   EOSABS 0.0  --   BASOSABS 0.0  --     Chemistries  Recent Labs  Lab 04/29/18 1053 05/03/18 0309  NA 138 141  K 4.0 4.2  CL 105 106  CO2 27 25  GLUCOSE 78 103*  BUN 15 12  CREATININE 1.17 0.96  CALCIUM 9.5 9.4  AST 16 24  ALT 13 16  ALKPHOS 64 67  BILITOT 0.6 0.3   Shon Hale M.D on 05/04/2018 at 6:25 PM  Pager---[phone] Go to [email or web address] - password [email or web address] for contact info  [facility] - Office  [phone]

## 2018-05-05 NOTE — Progress Notes (Signed)
Patient Demographics:    Lawrence Morgan, is a 37 y.o. male, DOB - 09/26/1980, ZOX:[id number]  Admit date - 04/29/2018   Admitting Physician No admitting provider for patient encounter.  Outpatient Primary MD for the patient is Fleet ContrasAvbuere, Edwin, MD  LOS - 4   Chief Complaint  Patient presents with  . Medical Clearance  . Suicidal        Subjective:    Lawrence Morgan today has no fevers, no emesis,  No chest pain, patient continues to verbalize auditory hallucinations telling him to steal and swallow things  Assessment  & Plan :    Principal Problem:   Schizoaffective disorder, bipolar type (HCC) Active Problems:   Ingestion of foreign body, initial encounter   PTSD (post-traumatic stress disorder)   Schizo-affective schizophrenia (HCC)   Suicide ideation  Brief Narrative:  Lawrence Morgan  is a 37 y.o.malewith a history of schizo affective disorder, bipolar disorder. Patient seen for abdominal pain after ingesting multiple foreign objects earlier this week. [facility] due to suicidal ideation and attempt by ingesting multiple foreign objects, including batteries and screws. He was transported to the emergency department today after developing abdominal pain which is mostly in his right upper quadrant. Pain is nonradiating. It is improved now. No other palliating or provoking factors. Denies rectal bleeding, melena. Patient is medically cleared/ready for transfer to inpatient psychiatric unit when bed is available   05/05/18-----Update-----no new medical or psychiatric issues, awaiting placement to inpatient psychiatry, eating and drinking well, had BM, ambulating in the hallways with one-to-one sitter  Plan   1)Ingestion of foreign body, initial encounter - EGD showed stone in stomach- unable to be removed with various different methods, - Gen surgery consulted- they  recommended to not intervene unless issues arise , - GI signed off, they attempted and were unable to remove the stomach stone w/ endoscopy - other items in colon are expecting to be discharged in the stool   General surgery signing off as of 05/03/2018... Tolerating solid food well  2) Schizoaffective disorder, bipolar type/ Suicide ideation - psych consult done, they recommended inpatient admission and other rec's as below, patient was reevaluated by Dr. Roosvelt HarpsJackie Norman the staff psychiatrist on 05/03/2018  -Continue 1:1 sitter for safety -Change/ increase Lithium carbonate to Lithobid CR 450 mg bid due to recurrent suicidal thoughts. -Increased Clozaril 250 mg at bedtime for schizoaffective disorder. -Continue Zoloft 100 mg daily for PTSD -Continue Benztropine and Atomoxetine. - cont other meds -Patient will need inpatient psychiatric placement, discussed with pharmacist, last dose of Invega was 04/06/2018 at patient's group home apparently patient is due for Invega at this time  3)Kliptomania----patient tells me the voices tell him to steal   Disposition/Need for in-Hospital Stay- patient unable to be discharged at this time due to awaiting inpatient psychiatric placement, [facility] due to history of mild intellectual disability.... Patient is medically cleared/ready for transfer to inpatient psychiatric unit when bed is available  Code Status : full   Disposition Plan  : Inpatient psychiatric unit  Consults  : Psychiatry/GI/general surgery  DVT Prophylaxis  :   - SCDs  Lab Results  Component Value Date   PLT 226 05/03/2018  Inpatient Medications  Scheduled Meds: . atomoxetine  25 mg Oral Daily  . benztropine  0.5 mg Oral Daily  . cloZAPine  250 mg Oral QHS  . lithium carbonate  450 mg Oral Q12H  . loratadine  10 mg Oral Daily  . pantoprazole  40 mg Oral Daily  . polyethylene glycol  17 g Oral Daily  . senna-docusate  2 tablet Oral BID  . sertraline  100 mg Oral  Daily   Continuous Infusions: PRN Meds:.acetaminophen **OR** acetaminophen, albuterol, hydrOXYzine, ondansetron **OR** ondansetron (ZOFRAN) IV, polyethylene glycol    Anti-infectives (From admission, onward)   None        Objective:   Vitals:   05/04/18 0614 05/04/18 1612 05/05/18 0449 05/05/18 1314  BP: (!) 127/96 (!) 106/56 (!) 102/56 125/70  Pulse: 82 89 80 96  Resp: 17 17 19 18   Temp: 97.7 F (36.5 C) 98.5 F (36.9 C) 98.7 F (37.1 C) 97.8 F (36.6 C)  TempSrc: Oral Axillary Oral Oral  SpO2: 100% 99% 99% 100%  Weight:      Height:        Wt Readings from Last 3 Encounters:  04/29/18 79.4 kg  02/02/17 81.6 kg     Intake/Output Summary (Last 24 hours) at 05/05/2018 1754 Last data filed at 05/05/2018 1700 Gross per 24 hour  Intake 1540 ml  Output -  Net 1540 ml     Physical Exam Patient is examined daily including today on 05/05/18 , exams remain the same as of yesterday except that has changed   Gen:- Awake Alert,  In no apparent distress,  HEENT:- [place].AT, No sclera icterus Neck-Supple Neck,No JVD,.  Lungs-  CTAB , fair symmetrical air movement CV- S1, S2 normal, regular  Abd-  +ve B.Sounds, Abd Soft, No tenderness,    Extremity/Skin:- No  edema, pedal pulses present  Psych-affect is flat, oriented x3 Neuro-no new focal deficits, no tremors   Data Review:   Micro Results No results found for this or any previous visit (from the past 240 hour(s)).  Radiology Reports Ct Abdomen Pelvis W Contrast  Result Date: 04/29/2018 CLINICAL DATA:  abd pain and nausea. Pt reports swallowing 20- AA batteries x2-4 weeks. Pt was voluntary at [place] [facility] from 11/[facility]. Pt came from [place] group home. Pt IVC'd today due to SI. Pt has hx of schzo effective. EXAM: CT ABDOMEN AND PELVIS WITH CONTRAST TECHNIQUE: Multidetector CT imaging of the abdomen and pelvis was performed using the standard protocol following bolus administration of intravenous contrast. CONTRAST:   OMNIPAQUE IOHEXOL 300 MG/ML  SOLN COMPARISON:  Radiographs from earlier the same day FINDINGS: Lower chest: Trace pleural effusions. No pericardial effusion. Minimal dependent atelectasis posteriorly at the left lung base. Hepatobiliary: No focal liver abnormality is seen. No gallstones, gallbladder wall thickening, or biliary dilatation. Pancreas: Unremarkable. No pancreatic ductal dilatation or surrounding inflammatory changes. Spleen: Normal in size without focal abnormality. Adrenals/Urinary Tract: Normal adrenals. Normal kidneys. Urinary bladder incompletely distended, unremarkable. Stomach/Bowel: Stomach is nondistended. There is a 3.7 cm cm oval smoothly marginated foreign body in the gastric antrum. Small bowel is decompressed. Moderate colonic fecal material. No dilatation or wall thickening. Multiple intraluminal foreign bodies. Probable AA battery in the cecum, clustered with an adjacent screw and nonspecific 2.2 cm smoothly marginated metallic foreign body. Probable AA battery in the hepatic flexure. Metallic screw and separate 2.2 cm coin shaped metallic density in the distal transverse colon. Probable AA battery in the mid descending colon. Vascular/Lymphatic: No  significant vascular findings are present. No enlarged abdominal or pelvic lymph nodes. Reproductive: Prostate is unremarkable. Other: No ascites.  Left pelvic phleboliths.  No free air. Musculoskeletal: No acute or significant osseous findings. IMPRESSION: 1. Multiple foreign bodies in the stomach and colon as detailed above, without evidence of perforation, obstruction, regional inflammatory change, or bowel wall thickening. Electronically Signed   By: Corlis Leak M.D.   On: 04/29/2018 12:31   Dg Abdomen Acute W/chest  Result Date: 04/29/2018 CLINICAL DATA:  Foreign body ingestion EXAM: DG ABDOMEN ACUTE W/ 1V CHEST COMPARISON:  04/27/08 FINDINGS: No abnormally dilated loops of bowel identified. There are multiple metallic  radiopaque foreign bodies identified within the abdomen. Within the right lower quadrant of the abdomen there are approximately 5 foreign bodies including a screw, 2 batteries, and several discoid foreign bodies which may represent coins. In the left abdomen there are 3 metallic foreign bodies including a screw, a battery and presumed coin. Within the central abdomen there is a less dense, round foreign body which measures approximately 4.2 cm. Heart size appears normal. No pleural effusion. The lungs are clear. IMPRESSION: Multiple radiodense foreign bodies are identified in the distribution of the ascending colon, transverse colon and descending colon. Foreign bodies include several batteries, screws and possibly several coring. No evidence to suggest bowel obstruction. Electronically Signed   By: Signa Kell M.D.   On: 04/29/2018 11:36     CBC Recent Labs  Lab 04/29/18 1053 05/03/18 0309  WBC 4.5 5.0  HGB 12.5* 11.8*  HCT 41.7 39.7  PLT 222 226  MCV 89.9 88.0  MCH 26.9 26.2  MCHC 30.0 29.7*  RDW 13.5 13.3  LYMPHSABS 1.1  --   MONOABS 0.5  --   EOSABS 0.0  --   BASOSABS 0.0  --     Chemistries  Recent Labs  Lab 04/29/18 1053 05/03/18 0309  NA 138 141  K 4.0 4.2  CL 105 106  CO2 27 25  GLUCOSE 78 103*  BUN 15 12  CREATININE 1.17 0.96  CALCIUM 9.5 9.4  AST 16 24  ALT 13 16  ALKPHOS 64 67  BILITOT 0.6 0.3   Shon Hale M.D on 05/05/2018 at 5:54 PM  Pager---[phone] Go to [email or web address] - password [email or web address] for contact info  [facility] - Office  [phone]

## 2018-05-06 LAB — CBC WITH DIFFERENTIAL/PLATELET
Abs Immature Granulocytes: 0.01 10*3/uL (ref 0.00–0.07)
BASOS ABS: 0 10*3/uL (ref 0.0–0.1)
Basophils Relative: 1 %
Eosinophils Absolute: 0.1 10*3/uL (ref 0.0–0.5)
Eosinophils Relative: 2 %
HEMATOCRIT: 37 % — AB (ref 39.0–52.0)
Hemoglobin: 11.2 g/dL — ABNORMAL LOW (ref 13.0–17.0)
Immature Granulocytes: 0 %
LYMPHS ABS: 1.7 10*3/uL (ref 0.7–4.0)
Lymphocytes Relative: 32 %
MCH: 26.5 pg (ref 26.0–34.0)
MCHC: 30.3 g/dL (ref 30.0–36.0)
MCV: 87.5 fL (ref 80.0–100.0)
Monocytes Absolute: 0.6 10*3/uL (ref 0.1–1.0)
Monocytes Relative: 11 %
Neutro Abs: 2.9 10*3/uL (ref 1.7–7.7)
Neutrophils Relative %: 54 %
Platelets: 204 10*3/uL (ref 150–400)
RBC: 4.23 MIL/uL (ref 4.22–5.81)
RDW: 13.6 % (ref 11.5–15.5)
WBC: 5.3 10*3/uL (ref 4.0–10.5)
nRBC: 0 % (ref 0.0–0.2)

## 2018-05-06 NOTE — Progress Notes (Signed)
Patient Demographics:    Lawrence Morgan, is a 37 y.o. male, DOB - 05-26-1981, ZOX:[id number]  Admit date - 04/29/2018   Admitting Physician No admitting provider for patient encounter.  Outpatient Primary MD for the patient is Fleet Contras, MD  LOS - 5   Chief Complaint  Patient presents with  . Medical Clearance  . Suicidal        Subjective:    Lawrence Morgan today has no fevers, no emesis,  No chest pain, patient continues to verbalize auditory hallucinations telling him to steal and swallow things  Assessment  & Plan :    Principal Problem:   Schizoaffective disorder, bipolar type (HCC) Active Problems:   Ingestion of foreign body, initial encounter   PTSD (post-traumatic stress disorder)   Schizo-affective schizophrenia (HCC)   Suicide ideation  Brief Narrative:  Lawrence Morgan  is a 37 y.o.malewith a history of schizo affective disorder, bipolar disorder. Patient seen for abdominal pain after ingesting multiple foreign objects earlier this week. Patient has been at [facility] facility due to suicidal ideation and attempt by ingesting multiple foreign objects, including batteries and screws. He was transported to the emergency department today after developing abdominal pain which is mostly in his right upper quadrant. Pain is nonradiating. It is improved now. No other palliating or provoking factors. Denies rectal bleeding, melena. Patient is medically cleared/ready for transfer to inpatient psychiatric unit when bed is available   05/06/18-----Update-----no new medical or psychiatric issues, awaiting placement to inpatient psychiatry, eating and drinking well,  ambulating in the hallways with one-to-one sitter  Plan   1)Ingestion of foreign body, initial encounter - EGD showed stone in stomach- unable to be removed with various different methods, - Gen surgery consulted- they  recommended to not intervene unless issues arise , - GI signed off, they attempted and were unable to remove the stomach stone w/ endoscopy - other items in colon are expecting to be discharged in the stool   General surgery signing off as of 05/03/2018... Tolerating solid food well  2) Schizoaffective disorder, bipolar type/ Suicide ideation - psych consult done, they recommended inpatient admission and other rec's as below, patient was reevaluated by Dr. Roosvelt Harps the staff psychiatrist on 05/03/2018  -Continue 1:1 sitter for safety -Change/ increase Lithium carbonate to Lithobid CR 450 mg bid due to recurrent suicidal thoughts. -Increased Clozaril 250 mg at bedtime for schizoaffective disorder. -Continue Zoloft 100 mg daily for PTSD -Continue Benztropine and Atomoxetine. -Patient will need inpatient psychiatric placement, discussed with pharmacist, last dose of Invega was 05/05/2018   3)Kliptomania----patient tells me the voices tell him to steal   Disposition/Need for in-Hospital Stay- patient unable to be discharged at this time due to awaiting inpatient psychiatric placement, unable to go to [facility] due to history of mild intellectual disability.... Patient is medically cleared/ready for transfer to inpatient psychiatric unit when bed is available  Code Status : full   Disposition Plan  : Inpatient psychiatric unit  Consults  : Psychiatry/GI/general surgery  DVT Prophylaxis  :   - SCDs  Lab Results  Component Value Date   PLT 204 05/06/2018    Inpatient Medications  Scheduled Meds: . atomoxetine  25 mg Oral Daily  . benztropine  0.5  mg Oral Daily  . cloZAPine  250 mg Oral QHS  . lithium carbonate  450 mg Oral Q12H  . loratadine  10 mg Oral Daily  . pantoprazole  40 mg Oral Daily  . polyethylene glycol  17 g Oral Daily  . senna-docusate  2 tablet Oral BID  . sertraline  100 mg Oral Daily   Continuous Infusions: PRN Meds:.acetaminophen **OR** acetaminophen, albuterol,  hydrOXYzine, ondansetron **OR** ondansetron (ZOFRAN) IV, polyethylene glycol    Anti-infectives (From admission, onward)   None        Objective:   Vitals:   05/04/18 1612 05/05/18 0449 05/05/18 1314 05/06/18 0532  BP: (!) 106/56 (!) 102/56 125/70 118/62  Pulse: 89 80 96 80  Resp: 17 19 18 18   Temp: 98.5 F (36.9 C) 98.7 F (37.1 C) 97.8 F (36.6 C) 97.7 F (36.5 C)  TempSrc: Axillary Oral Oral Oral  SpO2: 99% 99% 100% 100%  Weight:      Height:        Wt Readings from Last 3 Encounters:  04/29/18 79.4 kg  02/02/17 81.6 kg     Intake/Output Summary (Last 24 hours) at 05/06/2018 1637 Last data filed at 05/06/2018 1449 Gross per 24 hour  Intake 1940 ml  Output -  Net 1940 ml     Physical Exam Patient is examined daily including today on 05/06/18 , exams remain the same as of yesterday except that has changed   Gen:- Awake Alert,  In no apparent distress,  HEENT:- [place].AT, No sclera icterus Neck-Supple Neck,No JVD,.  Lungs-  CTAB , fair symmetrical air movement CV- S1, S2 normal, regular  Abd-  +ve B.Sounds, Abd Soft, No tenderness,    Extremity/Skin:- No  edema, pedal pulses present  Psych-affect is appropriate, oriented x3 Neuro-no new focal deficits, no tremors   Data Review:   Micro Results No results found for this or any previous visit (from the past 240 hour(s)).  Radiology Reports Ct Abdomen Pelvis W Contrast  Result Date: 04/29/2018 CLINICAL DATA:  abd pain and nausea. Pt reports swallowing 20- AA batteries x2-4 weeks. [facility]. Pt IVC'd today due to SI. Pt has hx of schzo effective. EXAM: CT ABDOMEN AND PELVIS WITH CONTRAST TECHNIQUE: Multidetector CT imaging of the abdomen and pelvis was performed using the standard protocol following bolus administration of intravenous contrast. CONTRAST:  100mL OMNIPAQUE IOHEXOL 300 MG/ML  SOLN COMPARISON:  Radiographs from earlier the same day  FINDINGS: Lower chest: Trace pleural effusions. No pericardial effusion. Minimal dependent atelectasis posteriorly at the left lung base. Hepatobiliary: No focal liver abnormality is seen. No gallstones, gallbladder wall thickening, or biliary dilatation. Pancreas: Unremarkable. No pancreatic ductal dilatation or surrounding inflammatory changes. Spleen: Normal in size without focal abnormality. Adrenals/Urinary Tract: Normal adrenals. Normal kidneys. Urinary bladder incompletely distended, unremarkable. Stomach/Bowel: Stomach is nondistended. There is a 3.7 cm cm oval smoothly marginated foreign body in the gastric antrum. Small bowel is decompressed. Moderate colonic fecal material. No dilatation or wall thickening. Multiple intraluminal foreign bodies. Probable AA battery in the cecum, clustered with an adjacent screw and nonspecific 2.2 cm smoothly marginated metallic foreign body. Probable AA battery in the hepatic flexure. Metallic screw and separate 2.2 cm coin shaped metallic density in the distal transverse colon. Probable AA battery in the mid descending colon. Vascular/Lymphatic: No significant vascular findings are present. No enlarged abdominal or pelvic lymph nodes. Reproductive: Prostate is unremarkable. Other: No  ascites.  Left pelvic phleboliths.  No free air. Musculoskeletal: No acute or significant osseous findings. IMPRESSION: 1. Multiple foreign bodies in the stomach and colon as detailed above, without evidence of perforation, obstruction, regional inflammatory change, or bowel wall thickening. Electronically Signed   By: Corlis Leak M.D.   On: 04/29/2018 12:31   Dg Abdomen Acute W/chest  Result Date: 04/29/2018 CLINICAL DATA:  Foreign body ingestion EXAM: DG ABDOMEN ACUTE W/ 1V CHEST COMPARISON:  04/27/08 FINDINGS: No abnormally dilated loops of bowel identified. There are multiple metallic radiopaque foreign bodies identified within the abdomen. Within the right lower quadrant of the  abdomen there are approximately 5 foreign bodies including a screw, 2 batteries, and several discoid foreign bodies which may represent coins. In the left abdomen there are 3 metallic foreign bodies including a screw, a battery and presumed coin. Within the central abdomen there is a less dense, round foreign body which measures approximately 4.2 cm. Heart size appears normal. No pleural effusion. The lungs are clear. IMPRESSION: Multiple radiodense foreign bodies are identified in the distribution of the ascending colon, transverse colon and descending colon. Foreign bodies include several batteries, screws and possibly several coring. No evidence to suggest bowel obstruction. Electronically Signed   By: Signa Kell M.D.   On: 04/29/2018 11:36     CBC Recent Labs  Lab 05/03/18 0309 05/06/18 0234  WBC 5.0 5.3  HGB 11.8* 11.2*  HCT 39.7 37.0*  PLT 226 204  MCV 88.0 87.5  MCH 26.2 26.5  MCHC 29.7* 30.3  RDW 13.3 13.6  LYMPHSABS  --  1.7  MONOABS  --  0.6  EOSABS  --  0.1  BASOSABS  --  0.0    Chemistries  Recent Labs  Lab 05/03/18 0309  NA 141  K 4.2  CL 106  CO2 25  GLUCOSE 103*  BUN 12  CREATININE 0.96  CALCIUM 9.4  AST 24  ALT 16  ALKPHOS 67  BILITOT 0.3   Shon Hale M.D on 05/06/2018 at 4:37 PM  Pager---[phone] Go to [email or web address] - password [email or web address] for contact info  [facility] - Office  [phone]

## 2018-05-07 NOTE — Progress Notes (Signed)
Patient Demographics:    Lawrence Morgan, is a 37 y.o. male, DOB - 09/10/1980, ZOX:[id number]  Admit date - 04/29/2018   Admitting Physician No admitting provider for patient encounter.  Outpatient Primary MD for the patient is Fleet ContrasAvbuere, Edwin, MD  LOS - 6   Chief Complaint  Patient presents with  . Medical Clearance  . Suicidal        Subjective:    Lawrence FilaRodney Fuller today has no fevers, no emesis, eating and drinking well, cooperative, one-to-one sitter at bedside  Assessment  & Plan :    Principal Problem:   Schizoaffective disorder, bipolar type (HCC) Active Problems:   Ingestion of foreign body, initial encounter   PTSD (post-traumatic stress disorder)   Schizo-affective schizophrenia (HCC)   Suicide ideation  Brief Narrative:  Lawrence FilaRodney Visscher  is a 37 y.o.malewith a history of schizo affective disorder, bipolar disorder. Patient seen for abdominal pain after ingesting multiple foreign objects earlier this week. [facility] due to suicidal ideation and attempt by ingesting multiple foreign objects, including batteries and screws. He was transported to the emergency department today after developing abdominal pain which is mostly in his right upper quadrant. Pain is nonradiating. It is improved now. No other palliating or provoking factors. Denies rectal bleeding, melena. Patient is medically cleared/ready for transfer to inpatient psychiatric unit when bed is available   12/8  /19-----Update-----no new medical or psychiatric issues, awaiting placement to inpatient psychiatry, eating and drinking well,  ambulating in the hallways with one-to-one sitter  Plan   1)Ingestion of foreign body, initial encounter - EGD showed stone in stomach- unable to be removed with various different methods, - Gen surgery consulted- they recommended to not intervene unless issues  arise , - GI signed off, they attempted and were unable to remove the stomach stone w/ endoscopy - other items in colon are expecting to be discharged in the stool   General surgery signing off as of 05/03/2018... Tolerating solid food well  2) Schizoaffective disorder, bipolar type/ Suicide ideation - psych consult done, they recommended inpatient admission and other rec's as below, patient was reevaluated by Dr. Roosvelt HarpsJackie Norman the staff psychiatrist on 05/03/2018  -Continue 1:1 sitter for safety -Change/ increase Lithium carbonate to Lithobid CR 450 mg bid due to recurrent suicidal thoughts. -Increased Clozaril 250 mg at bedtime for schizoaffective disorder. -Continue Zoloft 100 mg daily for PTSD -Continue Benztropine and Atomoxetine. -Patient will need inpatient psychiatric placement, discussed with pharmacist, last dose of Invega was 05/05/2018   3)Kliptomania----patient tells me the voices tell him to steal   Disposition/Need for in-Hospital Stay- patient unable to be discharged at this time due to awaiting inpatient psychiatric placement, [facility] due to history of mild intellectual disability.... Patient is medically cleared/ready for transfer to inpatient psychiatric unit when bed is available  Code Status : full   Disposition Plan  : Inpatient psychiatric unit  Consults  : Psychiatry/GI/general surgery  DVT Prophylaxis  :   - SCDs  Lab Results  Component Value Date   PLT 204 05/06/2018    Inpatient Medications  Scheduled Meds: . atomoxetine  25 mg Oral Daily  . benztropine  0.5 mg Oral Daily  . cloZAPine  250 mg Oral QHS  . lithium carbonate  450 mg Oral Q12H  . loratadine  10 mg Oral Daily  . pantoprazole  40 mg Oral Daily  . polyethylene glycol  17 g Oral Daily  . senna-docusate  2 tablet Oral BID  . sertraline  100 mg Oral Daily   Continuous Infusions: PRN Meds:.acetaminophen **OR** acetaminophen, albuterol, hydrOXYzine, ondansetron **OR** ondansetron  (ZOFRAN) IV, polyethylene glycol    Anti-infectives (From admission, onward)   None        Objective:   Vitals:   05/05/18 1314 05/06/18 0532 05/06/18 2013 05/07/18 0650  BP: 125/70 118/62 121/62 107/62  Pulse: 96 80 84 79  Resp: 18 18 19 18   Temp: 97.8 F (36.6 C) 97.7 F (36.5 C) 98.3 F (36.8 C) 98.1 F (36.7 C)  TempSrc: Oral Oral Oral Oral  SpO2: 100% 100% 100% 100%  Weight:      Height:        Wt Readings from Last 3 Encounters:  04/29/18 79.4 kg  02/02/17 81.6 kg     Intake/Output Summary (Last 24 hours) at 05/07/2018 1420 Last data filed at 05/07/2018 1330 Gross per 24 hour  Intake 1800 ml  Output -  Net 1800 ml     Physical Exam Patient is examined daily including today on 05/07/18 , exams remain the same as of yesterday except that has changed   Gen:- Awake Alert,  In no apparent distress,  HEENT:- [place].AT, No sclera icterus Neck-Supple Neck,No JVD,.  Lungs-  CTAB , fair symmetrical air movement CV- S1, S2 normal, regular  Abd-  +ve B.Sounds, Abd Soft, No tenderness,    Extremity/Skin:- No  edema, pedal pulses present  Psych-affect is appropriate, cooperative, oriented x3 Neuro-no new focal deficits, no tremors   Data Review:   Micro Results No results found for this or any previous visit (from the past 240 hour(s)).  Radiology Reports Ct Abdomen Pelvis W Contrast  Result Date: 04/29/2018 CLINICAL DATA:  abd pain and nausea. Pt reports swallowing 20- AA batteries x2-4 weeks. [facility] at [facility]. Pt came from [facility]. Pt IVC'd today due to SI. Pt has hx of schzo effective. EXAM: CT ABDOMEN AND PELVIS WITH CONTRAST TECHNIQUE: Multidetector CT imaging of the abdomen and pelvis was performed using the standard protocol following bolus administration of intravenous contrast. CONTRAST:  OMNIPAQUE IOHEXOL 300 MG/ML  SOLN COMPARISON:  Radiographs from earlier the same day FINDINGS: Lower chest: Trace pleural  effusions. No pericardial effusion. Minimal dependent atelectasis posteriorly at the left lung base. Hepatobiliary: No focal liver abnormality is seen. No gallstones, gallbladder wall thickening, or biliary dilatation. Pancreas: Unremarkable. No pancreatic ductal dilatation or surrounding inflammatory changes. Spleen: Normal in size without focal abnormality. Adrenals/Urinary Tract: Normal adrenals. Normal kidneys. Urinary bladder incompletely distended, unremarkable. Stomach/Bowel: Stomach is nondistended. There is a 3.7 cm cm oval smoothly marginated foreign body in the gastric antrum. Small bowel is decompressed. Moderate colonic fecal material. No dilatation or wall thickening. Multiple intraluminal foreign bodies. Probable AA battery in the cecum, clustered with an adjacent screw and nonspecific 2.2 cm smoothly marginated metallic foreign body. Probable AA battery in the hepatic flexure. Metallic screw and separate 2.2 cm coin shaped metallic density in the distal transverse colon. Probable AA battery in the mid descending colon. Vascular/Lymphatic: No significant vascular findings are present. No enlarged abdominal or pelvic lymph nodes. Reproductive: Prostate is unremarkable. Other: No ascites.  Left pelvic phleboliths.  No free  air. Musculoskeletal: No acute or significant osseous findings. IMPRESSION: 1. Multiple foreign bodies in the stomach and colon as detailed above, without evidence of perforation, obstruction, regional inflammatory change, or bowel wall thickening. Electronically Signed   By: Corlis Leak M.D.   On: 04/29/2018 12:31   Dg Abdomen Acute W/chest  Result Date: 04/29/2018 CLINICAL DATA:  Foreign body ingestion EXAM: DG ABDOMEN ACUTE W/ 1V CHEST COMPARISON:  04/27/08 FINDINGS: No abnormally dilated loops of bowel identified. There are multiple metallic radiopaque foreign bodies identified within the abdomen. Within the right lower quadrant of the abdomen there are approximately 5 foreign  bodies including a screw, 2 batteries, and several discoid foreign bodies which may represent coins. In the left abdomen there are 3 metallic foreign bodies including a screw, a battery and presumed coin. Within the central abdomen there is a less dense, round foreign body which measures approximately 4.2 cm. Heart size appears normal. No pleural effusion. The lungs are clear. IMPRESSION: Multiple radiodense foreign bodies are identified in the distribution of the ascending colon, transverse colon and descending colon. Foreign bodies include several batteries, screws and possibly several coring. No evidence to suggest bowel obstruction. Electronically Signed   By: Signa Kell M.D.   On: 04/29/2018 11:36     CBC Recent Labs  Lab 05/03/18 0309 05/06/18 0234  WBC 5.0 5.3  HGB 11.8* 11.2*  HCT 39.7 37.0*  PLT 226 204  MCV 88.0 87.5  MCH 26.2 26.5  MCHC 29.7* 30.3  RDW 13.3 13.6  LYMPHSABS  --  1.7  MONOABS  --  0.6  EOSABS  --  0.1  BASOSABS  --  0.0    Chemistries  Recent Labs  Lab 05/03/18 0309  NA 141  K 4.2  CL 106  CO2 25  GLUCOSE 103*  BUN 12  CREATININE 0.96  CALCIUM 9.4  AST 24  ALT 16  ALKPHOS 67  BILITOT 0.3   Shon Hale M.D on 05/07/2018 at 2:20 PM  Pager---[phone] Go to [email or web address] - password [email or web address] for contact info  [facility] - Office  [phone]

## 2018-05-08 NOTE — Progress Notes (Addendum)
05/08/2018  CSW called the following MH facilities about possible placement options for patient:  [facility]- Don't take referrals, have to come in through the emergency room  [facility] - never received the referral. Re-faxed it.   [facility]- left voicemail on intake line  [facility]-re-faxed referral   [facility]- declined  [facility]- At capacity, unable to take any new referrals   [facility]  [facility]-  At capacity for now, can change on a daily basis depending on discharges     [facility]- left voicemail on intake line  [facility]- Left voicemail on intake line  [facility]- At capacity, unable to determine when they would have a bed available   [facility]- Re-[facility]ed referral      Phone Call- CSW called and spoke with patient's father and gave an update on where we were with finding an available bed for his son.    Drucilla Schmidtaitlin , MSW, LCSW-A Clinical Social Worker [facility]

## 2018-05-08 NOTE — Progress Notes (Signed)
Patient Demographics:    Lawrence Morgan, is a 37 y.o. male, DOB - 04-05-1981, [id number]  Admit date - 04/29/2018   Admitting Physician No admitting provider for patient encounter.  Outpatient Primary MD for the patient is Fleet Contras, MD  LOS - 7   Chief Complaint  Patient presents with  . Medical Clearance  . Suicidal        Subjective:    Lawrence Morgan today has no fevers, no emesis, eating and drinking well, cooperative, one-to-one sitter at bedside  Assessment  & Plan :    Principal Problem:   Schizoaffective disorder, bipolar type (HCC) Active Problems:   Ingestion of foreign body, initial encounter   PTSD (post-traumatic stress disorder)   Schizo-affective schizophrenia (HCC)   Suicide ideation  Brief Narrative:  Lawrence Morgan  is a 37 y.o.malewith a history of schizo affective disorder, bipolar disorder. Patient seen for abdominal pain after ingesting multiple foreign objects earlier this week. Patient has been at [facility] facility due to suicidal ideation and attempt by ingesting multiple foreign objects, including batteries and screws. He was transported to the emergency department today after developing abdominal pain which is mostly in his right upper quadrant. Pain is nonradiating. It is improved now. No other palliating or provoking factors. Denies rectal bleeding, melena. Patient is medically cleared/ready for transfer to inpatient psychiatric unit when bed is available   05/08/18-----Update-----no new medical or psychiatric issues, awaiting placement to inpatient psychiatry, eating and drinking well,  ambulating in the hallways with one-to-one sitter  Plan   1)Ingestion of foreign body, initial encounter - EGD showed stone in stomach- unable to be removed with various different methods, - Gen surgery consulted- they recommended to not intervene unless issues arise ,  - GI signed off, they attempted and were unable to remove the stomach stone w/ endoscopy - other items in colon are expecting to be discharged in the stool   General surgery signing off as of 05/03/2018... Tolerating solid food well  2) Schizoaffective disorder, bipolar type/ Suicide ideation - psych consult done, they recommended inpatient admission and other rec's as below, patient was reevaluated by Dr. Roosvelt Harps the staff psychiatrist on 05/03/2018  -Continue 1:1 sitter for safety -Change/ increase Lithium carbonate to Lithobid CR 450 mg bid due to recurrent suicidal thoughts. -Increased Clozaril 250 mg at bedtime for schizoaffective disorder. -Continue Zoloft 100 mg daily for PTSD -Continue Benztropine and Atomoxetine. -Patient will need inpatient psychiatric placement, discussed with pharmacist, last dose of Invega was 05/05/2018   3)Kliptomania----patient tells me the voices tell him to steal   Disposition/Need for in-Hospital Stay- patient unable to be discharged at this time due to awaiting inpatient psychiatric placement, unable to go to [facility] due to history of mild intellectual disability.... Patient is medically cleared/ready for transfer to inpatient psychiatric unit when bed is available  Code Status : full   Disposition Plan  : Inpatient psychiatric unit  Consults  : Psychiatry/GI/general surgery  DVT Prophylaxis  :   - SCDs  Lab Results  Component Value Date   PLT 204 05/06/2018    Inpatient Medications  Scheduled Meds: . atomoxetine  25 mg Oral Daily  . benztropine  0.5 mg Oral Daily  . cloZAPine  250  mg Oral QHS  . lithium carbonate  450 mg Oral Q12H  . loratadine  10 mg Oral Daily  . pantoprazole  40 mg Oral Daily  . polyethylene glycol  17 g Oral Daily  . senna-docusate  2 tablet Oral BID  . sertraline  100 mg Oral Daily   Continuous Infusions: PRN Meds:.acetaminophen **OR** acetaminophen, albuterol, hydrOXYzine, ondansetron **OR** ondansetron (ZOFRAN)  IV, polyethylene glycol    Anti-infectives (From admission, onward)   None        Objective:   Vitals:   05/07/18 1508 05/07/18 2208 05/08/18 0534 05/08/18 1358  BP: 122/66 (!) 107/45 109/68 129/83  Pulse: 95 94 84 90  Resp: 17 18 16 16   Temp: 98.3 F (36.8 C) 98.7 F (37.1 C) 98.3 F (36.8 C) 98.3 F (36.8 C)  TempSrc: Oral Oral Oral Oral  SpO2: 100% 100% 100% 100%  Weight:      Height:        Wt Readings from Last 3 Encounters:  04/29/18 79.4 kg  02/02/17 81.6 kg     Intake/Output Summary (Last 24 hours) at 05/08/2018 1704 Last data filed at 05/08/2018 1200 Gross per 24 hour  Intake 1800 ml  Output 1 ml  Net 1799 ml     Physical Exam Patient is examined daily including today on 05/08/18 , exams remain the same as of yesterday except that has changed   Gen:- Awake Alert,  In no apparent distress,  HEENT:- [place].AT, No sclera icterus Neck-Supple Neck,No JVD,.  Lungs-  CTAB , fair symmetrical air movement CV- S1, S2 normal, regular  Abd-  +ve B.Sounds, Abd Soft, No tenderness,    Extremity/Skin:- No  edema, pedal pulses present  Psych-affect is appropriate, cooperative, oriented x3 Neuro-no new focal deficits, no tremors   Data Review:   Micro Results No results found for this or any previous visit (from the past 240 hour(s)).  Radiology Reports Ct Abdomen Pelvis W Contrast  Result Date: 04/29/2018 CLINICAL DATA:  abd pain and nausea. Pt reports swallowing 20- AA batteries x2-4 weeks. [facility]. Pt IVC'd today due to SI. Pt has hx of schzo effective. EXAM: CT ABDOMEN AND PELVIS WITH CONTRAST TECHNIQUE: Multidetector CT imaging of the abdomen and pelvis was performed using the standard protocol following bolus administration of intravenous contrast. CONTRAST:  100mL OMNIPAQUE IOHEXOL 300 MG/ML  SOLN COMPARISON:  Radiographs from earlier the same day FINDINGS: Lower chest: Trace pleural  effusions. No pericardial effusion. Minimal dependent atelectasis posteriorly at the left lung base. Hepatobiliary: No focal liver abnormality is seen. No gallstones, gallbladder wall thickening, or biliary dilatation. Pancreas: Unremarkable. No pancreatic ductal dilatation or surrounding inflammatory changes. Spleen: Normal in size without focal abnormality. Adrenals/Urinary Tract: Normal adrenals. Normal kidneys. Urinary bladder incompletely distended, unremarkable. Stomach/Bowel: Stomach is nondistended. There is a 3.7 cm cm oval smoothly marginated foreign body in the gastric antrum. Small bowel is decompressed. Moderate colonic fecal material. No dilatation or wall thickening. Multiple intraluminal foreign bodies. Probable AA battery in the cecum, clustered with an adjacent screw and nonspecific 2.2 cm smoothly marginated metallic foreign body. Probable AA battery in the hepatic flexure. Metallic screw and separate 2.2 cm coin shaped metallic density in the distal transverse colon. Probable AA battery in the mid descending colon. Vascular/Lymphatic: No significant vascular findings are present. No enlarged abdominal or pelvic lymph nodes. Reproductive: Prostate is unremarkable. Other: No ascites.  Left pelvic phleboliths.  No  free air. Musculoskeletal: No acute or significant osseous findings. IMPRESSION: 1. Multiple foreign bodies in the stomach and colon as detailed above, without evidence of perforation, obstruction, regional inflammatory change, or bowel wall thickening. Electronically Signed   By: Corlis Leak M.D.   On: 04/29/2018 12:31   Dg Abdomen Acute W/chest  Result Date: 04/29/2018 CLINICAL DATA:  Foreign body ingestion EXAM: DG ABDOMEN ACUTE W/ 1V CHEST COMPARISON:  04/27/08 FINDINGS: No abnormally dilated loops of bowel identified. There are multiple metallic radiopaque foreign bodies identified within the abdomen. Within the right lower quadrant of the abdomen there are approximately 5 foreign  bodies including a screw, 2 batteries, and several discoid foreign bodies which may represent coins. In the left abdomen there are 3 metallic foreign bodies including a screw, a battery and presumed coin. Within the central abdomen there is a less dense, round foreign body which measures approximately 4.2 cm. Heart size appears normal. No pleural effusion. The lungs are clear. IMPRESSION: Multiple radiodense foreign bodies are identified in the distribution of the ascending colon, transverse colon and descending colon. Foreign bodies include several batteries, screws and possibly several coring. No evidence to suggest bowel obstruction. Electronically Signed   By: Signa Kell M.D.   On: 04/29/2018 11:36     CBC Recent Labs  Lab 05/03/18 0309 05/06/18 0234  WBC 5.0 5.3  HGB 11.8* 11.2*  HCT 39.7 37.0*  PLT 226 204  MCV 88.0 87.5  MCH 26.2 26.5  MCHC 29.7* 30.3  RDW 13.3 13.6  LYMPHSABS  --  1.7  MONOABS  --  0.6  EOSABS  --  0.1  BASOSABS  --  0.0    Chemistries  Recent Labs  Lab 05/03/18 0309  NA 141  K 4.2  CL 106  CO2 25  GLUCOSE 103*  BUN 12  CREATININE 0.96  CALCIUM 9.4  AST 24  ALT 16  ALKPHOS 67  BILITOT 0.3   Shon Hale M.D on 05/08/2018 at 5:04 PM  Pager---[phone] Go to [email or web address] - password [email or web address] for contact info  [facility] - Office  [phone]

## 2018-05-09 NOTE — Social Work (Addendum)
CSW spoke with attending MD, MD states that psychiatry has cleared pt to return to group home. CSW called and discussed this with pt father who states "that group home is the reason why he tried to kill himself, he can't go back." CSW discussed that we usually are unable to place pts in different group homes from here at the hospital but that I would discuss this with our assistant director Wandra MannanZack Brooks.   Message left for Zack, await return call.  2:32pm- spoke with Zack, confirmed that we are unable to place pt in a different group home but we can work with group home and pt father to ensure pt has follow up appointment with [facility].   2:45pm- Informed pt father of conversation, did request also pt father bring in documentation of guardianship which pt father states will not be a possibility because he works. Pt father aware that we are unable to place pt from hospital in a different group home but that we can reach out to group home to discuss follow up services and family concerns. Pt has been cleared by psych (confirmed with Dr. Sharma CovertNorman). Called both numbers for group home given to me by pt father: await return call [phone]- HIPAA compliant message left for return call [phone]- number not in service  3:01pm- Spoke with Gatha MayerCarolyn Fernandez from [facility] [phone]), she states that she was told by pt's father that pt was not coming back and said to CSW that they were discharging him. We discussed that pt father has been informed by this writer that pt medically stable for discharge, cleared by psychiatry and he is aware we are not able to place pt in different group home at this time. [facility], group home unable to pick pt up until tomorrow. Attending MD aware.  Octavio GravesIsabel , MSW, [facility] [place] Clinical Social Work [phone]

## 2018-05-09 NOTE — Progress Notes (Signed)
Patient Demographics:    Lawrence FilaRodney Job, is a 37 y.o. male, DOB - 04/05/1981, [id number]  Admit date - 04/29/2018   Admitting Physician No admitting provider for patient encounter.  Outpatient Primary MD for the patient is Fleet ContrasAvbuere, Edwin, MD  LOS - 8   Chief Complaint  Patient presents with  . Medical Clearance  . Suicidal        Subjective:    Lawrence Morgan today has no fevers, no emesis, cooperative, no new concerns  Assessment  & Plan :    Principal Problem:   Schizoaffective disorder, bipolar type (HCC) Active Problems:   Ingestion of foreign body, initial encounter   PTSD (post-traumatic stress disorder)   Schizo-affective schizophrenia (HCC)   Suicide ideation  Brief Narrative:  Lawrence Morgan  is a 37 y.o.malewith a history of schizo affective disorder, bipolar disorder. Patient seen for abdominal pain after ingesting multiple foreign objects earlier this week. [facility] facility due to suicidal ideation and attempt by ingesting multiple foreign objects, including batteries and screws. He was transported to the emergency department today after developing abdominal pain which is mostly in his right upper quadrant. Pain is nonradiating. It is improved now. No other palliating or provoking factors. Denies rectal bleeding, melena.  Patient is medically cleared/ready for transfer to group home,  he has been cleared by psychiatrist on 05/09/18 , group home unable to pick him up until 05/10/2018, please see social worker note    05/08/18-----Update-----no new medical or psychiatric issues, Patient is medically cleared/ready for transfer to group home,  he has been cleared by psychiatrist on 05/09/18 , group home unable to pick him up until 05/10/2018, please see social worker note  eating and drinking well,    Plan   1)Ingestion of foreign body, initial encounter -  EGD showed stone in stomach- unable to be removed with various different methods, - Gen surgery consulted- they recommended to not intervene unless issues arise , - GI signed off, they attempted and were unable to remove the stomach stone w/ endoscopy - other items in colon are expecting to be discharged in the stool   General surgery signing off as of 05/03/2018... Tolerating solid food well  2) Schizoaffective disorder, bipolar type/ Suicide ideation -  patient was reevaluated by Dr. Roosvelt HarpsJackie Norman on 05/09/18, patient no longer requires patient psychiatric treatment, may discharge to group home -Change/ increase Lithium carbonate to Lithobid CR 450 mg bid due to recurrent suicidal thoughts. -Increased Clozaril 250 mg at bedtime for schizoaffective disorder. -Continue Zoloft 100 mg daily for PTSD -Continue Benztropine and Atomoxetine. -Patient will need inpatient psychiatric placement, discussed with pharmacist, last dose of Invega was 05/05/2018   3)Kliptomania----patient tells me the voices tell him to steal   Disposition/Need for in-Hospital Stay- patient unable to be discharged at this time due to.... Patient is medically cleared/ready for transfer to group home he has been cleared by psychiatrist on 05/09/18 , group home unable to pick him up until 05/10/2018, please see social worker note  Code Status : full   Disposition Plan  : Inpatient psychiatric unit  Consults  : Psychiatry/GI/general surgery  DVT Prophylaxis  :   - SCDs  Lab Results  Component Value Date  PLT 204 05/06/2018    Inpatient Medications  Scheduled Meds: . atomoxetine  25 mg Oral Daily  . benztropine  0.5 mg Oral Daily  . cloZAPine  250 mg Oral QHS  . lithium carbonate  450 mg Oral Q12H  . loratadine  10 mg Oral Daily  . pantoprazole  40 mg Oral Daily  . polyethylene glycol  17 g Oral Daily  . senna-docusate  2 tablet Oral BID  . sertraline  100 mg Oral Daily   Continuous Infusions: PRN  Meds:.acetaminophen **OR** acetaminophen, albuterol, hydrOXYzine, ondansetron **OR** ondansetron (ZOFRAN) IV, polyethylene glycol    Anti-infectives (From admission, onward)   None        Objective:   Vitals:   05/08/18 0534 05/08/18 1358 05/09/18 0645 05/09/18 1655  BP: 109/68 129/83 118/80 119/73  Pulse: 84 90 84 87  Resp: 16 16 16 18   Temp: 98.3 F (36.8 C) 98.3 F (36.8 C) 98 F (36.7 C) 98.3 F (36.8 C)  TempSrc: Oral Oral Oral Oral  SpO2: 100% 100% 99% 100%  Weight:      Height:        Wt Readings from Last 3 Encounters:  04/29/18 79.4 kg  02/02/17 81.6 kg     Intake/Output Summary (Last 24 hours) at 05/09/2018 1835 Last data filed at 05/09/2018 1556 Gross per 24 hour  Intake 600 ml  Output -  Net 600 ml     Physical Exam Patient is examined daily including today on 05/09/18 , exams remain the same as of yesterday except that has changed   Gen:- Awake Alert,  In no apparent distress, polite HEENT:- [place].AT, No sclera icterus Neck-Supple Neck,No JVD,.  Lungs-  CTAB , fair symmetrical air movement CV- S1, S2 normal, regular  Abd-  +ve B.Sounds, Abd Soft, No tenderness,    Extremity/Skin:- No  edema, pedal pulses present  Psych-affect is appropriate, cooperative, oriented x3 Neuro-no new focal deficits, no tremors   Data Review:   Micro Results No results found for this or any previous visit (from the past 240 hour(s)).  Radiology Reports Ct Abdomen Pelvis W Contrast  Result Date: 04/29/2018 CLINICAL DATA:  abd pain and nausea. Pt reports swallowing 20- AA batteries x2-4 weeks. [facility] was [facility] at [facility] from [facility]. Pt came from [facility] group [facility]. Pt IVC'd today due to SI. Pt has hx of schzo effective. EXAM: CT ABDOMEN AND PELVIS WITH CONTRAST TECHNIQUE: Multidetector CT imaging of the abdomen and pelvis was performed using the standard protocol following bolus administration of intravenous contrast. CONTRAST:  OMNIPAQUE IOHEXOL 300  MG/ML  SOLN COMPARISON:  Radiographs from earlier the same day FINDINGS: Lower chest: Trace pleural effusions. No pericardial effusion. Minimal dependent atelectasis posteriorly at the left lung base. Hepatobiliary: No focal liver abnormality is seen. No gallstones, gallbladder wall thickening, or biliary dilatation. Pancreas: Unremarkable. No pancreatic ductal dilatation or surrounding inflammatory changes. Spleen: Normal in size without focal abnormality. Adrenals/Urinary Tract: Normal adrenals. Normal kidneys. Urinary bladder incompletely distended, unremarkable. Stomach/Bowel: Stomach is nondistended. There is a 3.7 cm cm oval smoothly marginated foreign body in the gastric antrum. Small bowel is decompressed. Moderate colonic fecal material. No dilatation or wall thickening. Multiple intraluminal foreign bodies. Probable AA battery in the cecum, clustered with an adjacent screw and nonspecific 2.2 cm smoothly marginated metallic foreign body. Probable AA battery in the hepatic flexure. Metallic screw and separate 2.2 cm coin shaped metallic density in the distal transverse colon. Probable AA battery in the mid  descending colon. Vascular/Lymphatic: No significant vascular findings are present. No enlarged abdominal or pelvic lymph nodes. Reproductive: Prostate is unremarkable. Other: No ascites.  Left pelvic phleboliths.  No free air. Musculoskeletal: No acute or significant osseous findings. IMPRESSION: 1. Multiple foreign bodies in the stomach and colon as detailed above, without evidence of perforation, obstruction, regional inflammatory change, or bowel wall thickening. Electronically Signed   By: Corlis Leak M.D.   On: 04/29/2018 12:31   Dg Abdomen Acute W/chest  Result Date: 04/29/2018 CLINICAL DATA:  Foreign body ingestion EXAM: DG ABDOMEN ACUTE W/ 1V CHEST COMPARISON:  04/27/08 FINDINGS: No abnormally dilated loops of bowel identified. There are multiple metallic radiopaque foreign bodies identified  within the abdomen. Within the right lower quadrant of the abdomen there are approximately 5 foreign bodies including a screw, 2 batteries, and several discoid foreign bodies which may represent coins. In the left abdomen there are 3 metallic foreign bodies including a screw, a battery and presumed coin. Within the central abdomen there is a less dense, round foreign body which measures approximately 4.2 cm. Heart size appears normal. No pleural effusion. The lungs are clear. IMPRESSION: Multiple radiodense foreign bodies are identified in the distribution of the ascending colon, transverse colon and descending colon. Foreign bodies include several batteries, screws and possibly several coring. No evidence to suggest bowel obstruction. Electronically Signed   By: Signa Kell M.D.   On: 04/29/2018 11:36     CBC Recent Labs  Lab 05/03/18 0309 05/06/18 0234  WBC 5.0 5.3  HGB 11.8* 11.2*  HCT 39.7 37.0*  PLT 226 204  MCV 88.0 87.5  MCH 26.2 26.5  MCHC 29.7* 30.3  RDW 13.3 13.6  LYMPHSABS  --  1.7  MONOABS  --  0.6  EOSABS  --  0.1  BASOSABS  --  0.0    Chemistries  Recent Labs  Lab 05/03/18 0309  NA 141  K 4.2  CL 106  CO2 25  GLUCOSE 103*  BUN 12  CREATININE 0.96  CALCIUM 9.4  AST 24  ALT 16  ALKPHOS 67  BILITOT 0.3   Lawrence Morgan M.D on 05/09/2018 at 6:35 PM  Pager---[phone] Go to [email or web address] - password [email or web address] for contact info  [facility] - Office  [phone]

## 2018-05-09 NOTE — Consult Note (Signed)
[facility]  05/09/2018 12:26 PM Lawrence FilaRodney Morgan  MRN:  [id number] Subjective:   Lawrence Morgan was last seen by the psychiatry consult service on 12/4 for intentional ingestion of multiple foreign objects due to [facility] harm self. He endorsed ongoing CAH with thoughts to harm self. He was recommended for inpatient psychiatric hospitalization and Clozaril was increased. He was admitted to the hospital on 11/30 with abdominal pain after ingesting multiple foreign bodies. He did not require surgery. His diet has been advanced to a regular diet. He is from a group home. He has a history of schizoaffective disorder and kleptomania.   Of note, he received his monthly Invega injection on 12/6. Per primary team, he has been in no distress. He appears to be enjoying his hospital admission. He has been seen ambulating the halls and pleasantly engaging with staff and enjoys watching television.   On interview, Lawrence Morgan reports that he is doing fine. He denies SI, HI or AVH. He reports having visions of people getting "shredded" in his dreams. He reports sometimes waking up in cold sweats. He denies disturbances in his sleep. He denies problems with appetite. He is tolerating the higher dose of Clozaril without side effects. He repeatedly requests to go to a "mental institution" like [facility]. He reports that the primary team is recommending this so that he will no longer swallow foreign objects. He was informed that going to an inpatient psychiatric hospital cannot guarantee that this will never happen again. He denies receiving therapy services and is willing to see a therapist to develop healthy coping skills to manage his stressors. He reports that he cannot return to his group home because they have discharged him.    Principal Problem: Schizoaffective disorder, bipolar type (HCC) Diagnosis:  Principal Problem:   Schizoaffective disorder, bipolar type (HCC) Active Problems:   Ingestion of foreign  body, initial encounter   PTSD (post-traumatic stress disorder)   Schizo-affective schizophrenia (HCC)   Suicide ideation  Total Time spent with patient: 15 minutes  Past Psychiatric History: Schizoaffective disorder, bipolar type, IDD, PTSD, personality disorder and marijuana abuse.   Past Medical History:  Past Medical History:  Diagnosis Date  . Intellectual disability    mild  . Marijuana use   . Personality disorder (HCC)   . PTSD (post-traumatic stress disorder)   . 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 Schizoaffective disorder, bipolar type [facility](HCC)     Past Surgical History:  Procedure Laterality Date  . ESOPHAGOGASTRODUODENOSCOPY (EGD) WITH PROPOFOL N/A 04/30/2018   Procedure: ESOPHAGOGASTRODUODENOSCOPY (EGD) WITH PROPOFOL;  Surgeon: Kathi DerBrahmbhatt, Parag, MD;  Location: [facility] ENDOSCOPY;  Service: Gastroenterology;  Laterality: N/A;  . FOREIGN BODY REMOVAL N/A 04/30/2018   Procedure: FOREIGN BODY REMOVAL;  Surgeon: Kathi DerBrahmbhatt, Parag, MD;  Location: [facility] ENDOSCOPY;  Service: Gastroenterology;  Laterality: N/A;  . FRACTURE SURGERY      r ankle    Family History: History reviewed. No pertinent family history. Family Psychiatric  History: None per chart review.  Social History:  Social History   Substance and Sexual Activity  Alcohol Use Not Currently     Social History   Substance and Sexual Activity  Drug Use Yes  . Types: Marijuana    Social History   Socioeconomic History  . Marital status: Single    Spouse name: Not on file  . Number of children: Not on file  . Years of education: Not on file  . Highest education level: Not on file  Occupational  History  . Not on file  Social Needs  . Financial resource strain: Not on file  . Food insecurity:    Worry: Not on file    Inability: Not on file  . Transportation needs:    Medical: Not on file    Non-medical: Not on file  Tobacco Use  . Smoking status: Current Every Day Smoker    Types: Cigars  .  Smokeless tobacco: Never Used  Substance and Sexual Activity  . Alcohol use: Not Currently  . Drug use: Yes    Types: Marijuana  . Sexual activity: Not on file  Lifestyle  . Physical activity:    Days per week: Not on file    Minutes per session: Not on file  . Stress: Not on file  Relationships  . Social connections:    Talks on phone: Not on file    Gets together: Not on file    Attends religious service: Not on file    Active member of club or organization: Not on file    Attends meetings of clubs or organizations: Not on file    Relationship status: Not on file  Other Topics Concern  . Not on file  Social History Narrative  . Not on file    Sleep: Good  Appetite:  Good  Current Medications: Current Facility-Administered Medications  Medication Dose Route Frequency Provider Last Rate Last Dose  . acetaminophen (TYLENOL) tablet 650 mg  650 mg Oral Q6H PRN Levie Heritage, DO   650 mg at 05/02/18 4098   Or  . acetaminophen (TYLENOL) suppository 650 mg  650 mg Rectal Q6H PRN Levie Heritage, DO      . albuterol (PROVENTIL) (2.5 MG/3ML) 0.083% nebulizer solution 2.5 mg  2.5 mg Nebulization Q6H PRN Levie Heritage, DO      . atomoxetine (STRATTERA) capsule 25 mg  25 mg Oral Daily Levie Heritage, DO   25 mg at 05/09/18 1031  . benztropine (COGENTIN) tablet 0.5 mg  0.5 mg Oral Daily Levie Heritage, DO   0.5 mg at 05/09/18 1031  . cloZAPine (CLOZARIL) tablet 250 mg  250 mg Oral QHS Emokpae, Courage, MD   250 mg at 05/08/18 2123  . hydrOXYzine (ATARAX/VISTARIL) tablet 25 mg  25 mg Oral TID PRN Levie Heritage, DO      . lithium carbonate (ESKALITH) CR tablet 450 mg  450 mg Oral Q12H Akintayo, Mojeed, MD   450 mg at 05/09/18 1031  . loratadine (CLARITIN) tablet 10 mg  10 mg Oral Daily Levie Heritage, DO   10 mg at 05/09/18 1031  . ondansetron (ZOFRAN) tablet 4 mg  4 mg Oral Q6H PRN Levie Heritage, DO       Or  . ondansetron [facility]) injection 4 mg  4 mg Intravenous  Q6H PRN Levie Heritage, DO      . pantoprazole (PROTONIX) EC tablet 40 mg  40 mg Oral Daily Rayburn, Kelly A, PA-C   40 mg at 05/09/18 1031  . polyethylene glycol (MIRALAX / GLYCOLAX) packet 17 g  17 g Oral Daily Levie Heritage, DO   17 g at 05/09/18 1031  . polyethylene glycol (MIRALAX / GLYCOLAX) packet 17 g  17 g Oral Daily PRN Levie Heritage, DO      . senna-docusate (Senokot-S) tablet 2 tablet  2 tablet Oral BID Shon Hale, MD   2 tablet at 05/09/18 1031  . sertraline (ZOLOFT) tablet 100 mg  100 mg Oral Daily Levie Heritage, DO   100 mg at 05/09/18 1031    Lab Results:  No results found for this or any previous visit (from the past 48 hour(s)).  Blood Alcohol level:  Lab Results  Component Value Date   ETH <10 04/29/2018   ETH  07/14/2008    6        LOWEST DETECTABLE LIMIT FOR SERUM ALCOHOL IS 5 mg/dL FOR MEDICAL PURPOSES ONLY    Musculoskeletal: Strength & Muscle Tone: within normal limits Gait & Station: UTA since lying on a couch. Patient leans: N/A  Psychiatric Specialty Exam: Physical Exam  Nursing note and vitals reviewed. Constitutional: He is oriented to person, place, and time. He appears well-developed and well-nourished.  HENT:  Head: Normocephalic and atraumatic.  Neck: Normal range of motion.  Respiratory: Effort normal.  Musculoskeletal: Normal range of motion.  Neurological: He is alert and oriented to person, place, and time.  Psychiatric: He has a normal mood and affect. His speech is normal and behavior is normal. Thought content normal. Cognition and memory are normal. He expresses impulsivity.    Review of Systems  Cardiovascular: Negative for chest pain.  Gastrointestinal: Negative for abdominal pain, constipation, diarrhea, nausea and vomiting.  Psychiatric/Behavioral: Negative for depression, hallucinations and suicidal ideas. The patient does not have insomnia.   All other systems reviewed and are negative.   Blood pressure  118/80, pulse 84, temperature 98 F (36.7 C), temperature source Oral, resp. rate 16, height 5\' 10"  (1.778 m), weight 79.4 kg, SpO2 99 %.Body mass index is 25.11 kg/m.  General Appearance: Fairly Groomed, young, African American male, wearing paper hospital scrubs with a beard and corrective lenses who is sitting on a couch. He was asleep prior to interview. NAD.   Eye Contact:  Good  Speech:  Clear and Coherent and Normal Rate  Volume:  Normal  Mood:  Euthymic  Affect:  Constricted  Thought Process:  Goal Directed, Linear and Descriptions of Associations: Intact  Orientation:  Full (Time, Place, and Person)  Thought Content:  Logical  Suicidal Thoughts:  No  Homicidal Thoughts:  No  Memory:  Immediate;   Good Recent;   Good Remote;   Good  Judgement:  Poor  Insight:  Fair  Psychomotor Activity:  Normal  Concentration:  Concentration: Good and Attention Span: Good  Recall:  Good  Fund of Knowledge:  Fair  Language:  Fair  Akathisia:  No  Handed:  Right  AIMS (if indicated):   N/A  Assets:  Communication Skills Desire for Improvement Financial Resources/Insurance Housing Physical Health  ADL's:  Intact  Cognition:  WNL  Sleep:   Okay   Assessment:  Lawrence Morgan is a 37 y.o. male who was admitted with abdominal pain after ingesting multiple foreign bodies due to [facility]. He denies SI, HI or AVH at this time. He appears to enjoy being hospitalized and does not want to return to his group home. He will benefit from outpatient therapy to develop healthy coping skills to manage ongoing stressors. He appears to be doing well at the higher dose of Clozaril. He no longer warrants inpatient psychiatric hospitalization.    Treatment Plan Summary: -Continue Clozaril 250 mg qhs for psychosis, Strattera 25 mg daily for attention, Cogentin 0.5 mg daily for EPS prophylaxis, Zoloft 100 mg daily for mood, Lithium CR 450 mg BID for mood stabilization and monthly Invega 234 mg injection (last given  on 12/6).  -EKG reviewed and QTc  441 on 11/30. Please closely monitor when starting or increasing QTc prolonging agents.  -Please have SW provide patient with outpatient resources for therapy.  -Patient is psychiatrically cleared. Will sign off on patient at this time. Please consult psychiatry again as needed.     Cherly Beach, DO 05/09/2018, 12:26 PM

## 2018-05-10 MED ORDER — CLOZAPINE 100 MG PO TABS: 250.0000 mg | ORAL_TABLET | Freq: Every day | ORAL | 0 refills | Status: AC

## 2018-05-10 MED ORDER — PANTOPRAZOLE SODIUM 40 MG PO TBEC: 40.0000 mg | DELAYED_RELEASE_TABLET | Freq: Every day | ORAL | 0 refills | Status: DC

## 2018-05-10 MED ORDER — CLOZAPINE 100 MG PO TABS: 450.0000 mg | ORAL_TABLET | Freq: Every day | ORAL | 0 refills | Status: DC

## 2018-05-10 MED ORDER — LITHIUM CARBONATE 150 MG PO CAPS: 450.0000 mg | ORAL_CAPSULE | Freq: Two times a day (BID) | ORAL | 0 refills | Status: DC

## 2018-05-10 NOTE — Progress Notes (Signed)
Patient discharged to group home. Discharge instructions reviewed with patient's father Rickie. AVS provided to Rickie and AVS for the group home. Received permission to provide group home recent labs, per their request. Printed copy placed with AVS and RX.   Family verbalizes understanding of all discharge instructions including discharge medications. Patient transported by father.

## 2018-05-10 NOTE — Social Work (Addendum)
4:05pm- CSW called DSS APS hotline for guidance as CSW needs clarification as to if the pt father verbal statement counted as a true discharge from group home. Report given to APS, spoke with pt father and he is going to call group home and let them know that pt will be returning. Pt father states that the group home would like to have discharge summary with medications and a call from MD if possible. Pt father states he will be here in a few hours to pick up pt.   3:37pm- Spoke with Hydrograph[employer] surveyorlaw enforcement officer that had visited the group home [phone]). Per the officer "I tried to give a gentleman at the group home your number but he would not take it and said he wouldn't call you. He told me that the residents father had signed his discharge papers." CSW contacted pt's father and he says that he had verbally told the group home that pt was not returning earlier during hospital stay but that he had not signed any documentation and told the CSW that "I think I have 60 or 90 days notice that they have to give me." CSW let pt father know that I would contact our CSW Chiropodistassistant director for next steps. Pt father volunteered that he could transport the pt back to group home but CSW requested that we confirm next steps with CSW leadership before he does that.  2:39pm- Still no return call from group home, [facility] to [address], [place], [place], [id number].   1:41pm- Message left again with Gatha MayerCarolyn Fernandez at [phone].  12:10pm- additional phone call made to [phone], continue to await return call regarding pick up time. Pt father aware.   8:27am- CSW returned a call from pt's father, discussed again that we are not able to change a pt's group home while here at the hospital and that this writer was told that the group home would pick pt up today by Gatha Mayerarolyn Fernandez. Pt father states understanding- requests that CSW call him back when time for  pick up confirmed and that way we can all speak with pt so he knows that pt father is working on moving him to another home.  Call placed to Gatha Mayerarolyn Fernandez at [phone], await return call.  Octavio GravesIsabel , MSW, [facility] [place] Clinical Social Work [phone]

## 2018-05-10 NOTE — Social Work (Signed)
CSW returned a call from pt's father, discussed again that we are not able to change a pt's group home while here at the hospital and that this writer was told that the group home would pick pt up today by Gatha Mayerarolyn Fernandez. Pt father states understanding- requests that CSW call him back when time for pick up confirmed and that way we can all speak with pt so he knows that pt father is working on moving him to another home.  Call placed to Gatha Mayerarolyn Fernandez at [phone], await return call.  Octavio GravesIsabel , MSW, [facility] [place] Clinical Social Work [phone]

## 2018-05-10 NOTE — Discharge Summary (Addendum)
Discharge Summary  Lawrence Morgan ZOX:[id number] DOB: 07/06/1980  PCP: Fleet Contras, MD  Admit date: 04/29/2018 Discharge date: 05/10/2018   Time spent: < 25 minutes  Admitted From: home Disposition:  home  Recommendations for Outpatient Follow-up:  1. Follow up with PCP in 1 week 2. Continue outpatient psychiatry resources (previously using [facility]) 3. Continue Protonix (script provided on discharge) 4. Increased dose of lithium 450 mg twice daily and clozapine 250 mg nightly ( scripts provided on discharge)      Discharge Diagnoses:  Active Hospital Problems   Diagnosis Date Noted  . Schizoaffective disorder, bipolar type (HCC)   . Ingestion of foreign body, initial encounter 04/29/2018  . 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 Suicide ideation 04/29/2018  . PTSD (post-traumatic stress disorder)     Resolved Hospital Problems  No resolved problems to display.    Discharge Condition: Stable   CODE STATUS:FULL    History of present illness:  Lawrence Morgan is a 36 y.o. year old male with medical history significant for schizoaffective disorder, bipolar disorder who presented on 04/29/2018 with abdominal pain after multiple foreign body ingestions in the setting of suicidal ideation earlier this past week for which she was being seen at [facility] facility.  And was found to have  multiple foreign bodies in the stomach and colon without evidence of perforation or obstruction. Remaining hospital course addressed in problem based format below:   Hospital Course:   Multiple foreign body ingestion with foreign body in stomacy.  Patient had ingested 20 batteries 2-4 weeks prior and was a voluntary participant at [facility] crisis for suicidal ideation before presenting with worsening abdominal pain and nausea  EGD showed stone in gastric fundus but was unable to be retrieved by GI.  Surgery recommended conservative management as gastric bezoar should be left alone since  not able to remove endoscopically and patient had no obstructive symptoms.  He tolerated oral diet without complications.  Patient is recommended to continue PPI and as neededlaxatives  Schizoaffective disorder  Initially on consultation (04/30/2018) psychiatry recommended inpatient psychiatric admission for stabilization given patient's recurrent suicidal thoughts and recent suicide attempt in setting of command auditory hallucinations.  His lithium was increased to 450 mg twice daily due to recurrent suicidal thoughts.  Psychiatry reevaluated on 12/4 patient was still found to have new suicidal ideation with a plan there was again recommended that patient have psychiatric hospitalization.  After receiving his invega injection on 12/6 patient had improved mood with no recurrent SI or HI ideation.  Psychiatry reevaluated on 12/10 and agreed he appeared to be doing well with development of healthy coping skills and no longer warranted inpatient psychiatric hospitalization.  Psychiatry did recommend patient having continued outpatient resources (currently seeking treatment at [facility]). His new medication changes include  lithium 450 mg BID, and Clozapine 250 qhs which he will continue on discharge. Patient's care was discussed with his group home liaison Mrs.Lily Peer prior to discharge.    Consultations:  Psychiatry, GI, surgery  Procedures/Studies: 04/30/2018: EGD, unable to retrieve stone in stomach.  Otherwise normal scope.   Discharge Exam: BP 115/60   Pulse (!) 102   Temp 99 F (37.2 C) (Oral)   Resp 18   Ht 5\' 10"  (1.778 m)   Wt 79.4 kg   SpO2 100%   BMI 25.11 kg/m   General: Lying in bed, no apparent distress Eyes: EOMI, anicteric ENT: Oral Mucosa clear and moist Cardiovascular: regular rate and rhythm, no murmurs, rubs or gallops, no  edema, Respiratory: Normal respiratory effort, lungs clear to auscultation bilaterally Abdomen: soft, non-distended, non-tender, normal bowel  sounds Skin: No Rash Neurologic: Grossly no focal neuro deficit.Mental status AAOx3, speech normal, Psychiatric:Appropriate affect, and mood, no SI, no HI, no auditory or visual hallucinations.   Discharge Instructions You were cared for by a hospitalist during your hospital stay. If you have any questions about your discharge medications or the care you received while you were in the hospital after you are discharged, you can call the unit and asked to speak with the hospitalist on call if the hospitalist that took care of you is not available. Once you are discharged, your primary care physician will handle any further medical issues. Please note that NO REFILLS for any discharge medications will be authorized once you are discharged, as it is imperative that you return to your primary care physician (or establish a relationship with a primary care physician if you do not have one) for your aftercare needs so that they can reassess your need for medications and monitor your lab values.  Discharge Instructions    Diet - low sodium heart healthy   Complete by:  As directed    Increase activity slowly   Complete by:  As directed      Allergies as of 05/10/2018      Reactions   Haldol [haloperidol Lactate]    Thorazine [chlorpromazine]    Shellfish Allergy Itching, Rash      Medication List    TAKE these medications   albuterol 108 (90 Base) MCG/ACT inhaler Commonly known as:  PROVENTIL HFA;VENTOLIN HFA Inhale 2 puffs into the lungs every 6 (six) hours as needed for wheezing or shortness of breath.   atomoxetine 25 MG capsule Commonly known as:  STRATTERA Take 25 mg by mouth daily.   benztropine 0.5 MG tablet Commonly known as:  COGENTIN Take 0.5 mg by mouth daily.   cetirizine 10 MG tablet Commonly known as:  ZYRTEC Take 10 mg by mouth daily.   cloZAPine 100 MG tablet Commonly known as:  CLOZARIL Take 2.5 tablets (250 mg total) by mouth at bedtime. What changed:  how much  to take   Docusate Sodium 100 MG capsule Take 200 mg by mouth 2 (two) times daily.   hydrOXYzine 25 MG tablet Commonly known as:  ATARAX/VISTARIL Take 25 mg by mouth 3 (three) times daily as needed for anxiety.   INVEGA SUSTENNA 234 MG/1.5ML Susy injection Generic drug:  paliperidone Inject 234 mg into the muscle every 30 (thirty) days.   lithium carbonate 150 MG capsule Take 3 capsules (450 mg total) by mouth 2 (two) times daily with a meal. Take 1 tablet (300 mg totally) by mouth in the morning; take 2 tablets (600 mg totally) by mouth at bed time What changed:    medication strength  how much to take  when to take this   pantoprazole 40 MG tablet Commonly known as:  PROTONIX Take 1 tablet (40 mg total) by mouth daily.   polyethylene glycol packet Commonly known as:  MIRALAX / GLYCOLAX Take 17 g by mouth daily.   sertraline 100 MG tablet Commonly known as:  ZOLOFT Take 100 mg by mouth daily.   Vitamin D3 50 MCG (2000 UT) capsule Take 2,000 Units by mouth daily.      Allergies  Allergen Reactions  . Haldol [Haloperidol Lactate]   . Thorazine [Chlorpromazine]   . Shellfish Allergy Itching and Rash      The results  of significant diagnostics from this hospitalization (including imaging, microbiology, ancillary and laboratory) are listed below for reference.    Significant Diagnostic Studies: Ct Abdomen Pelvis W Contrast  Result Date: 04/29/2018 CLINICAL DATA:  abd pain and nausea. Pt reports swallowing 20- AA batteries x2-4 weeks. Pt was voluntary at [place] crisis from 11/22. Pt came from [place] [facility] home. Pt IVC'd today due to SI. Pt has hx of schzo effective. EXAM: CT ABDOMEN AND PELVIS WITH CONTRAST TECHNIQUE: Multidetector CT imaging of the abdomen and pelvis was performed using the standard protocol following bolus administration of intravenous contrast. CONTRAST:  OMNIPAQUE IOHEXOL 300 MG/ML  SOLN COMPARISON:  Radiographs from earlier the same day  FINDINGS: Lower chest: Trace pleural effusions. No pericardial effusion. Minimal dependent atelectasis posteriorly at the left lung base. Hepatobiliary: No focal liver abnormality is seen. No gallstones, gallbladder wall thickening, or biliary dilatation. Pancreas: Unremarkable. No pancreatic ductal dilatation or surrounding inflammatory changes. Spleen: Normal in size without focal abnormality. Adrenals/Urinary Tract: Normal adrenals. Normal kidneys. Urinary bladder incompletely distended, unremarkable. Stomach/Bowel: Stomach is nondistended. There is a 3.7 cm cm oval smoothly marginated foreign body in the gastric antrum. Small bowel is decompressed. Moderate colonic fecal material. No dilatation or wall thickening. Multiple intraluminal foreign bodies. Probable AA battery in the cecum, clustered with an adjacent screw and nonspecific 2.2 cm smoothly marginated metallic foreign body. Probable AA battery in the hepatic flexure. Metallic screw and separate 2.2 cm coin shaped metallic density in the distal transverse colon. Probable AA battery in the mid descending colon. Vascular/Lymphatic: No significant vascular findings are present. No enlarged abdominal or pelvic lymph nodes. Reproductive: Prostate is unremarkable. Other: No ascites.  Left pelvic phleboliths.  No free air. Musculoskeletal: No acute or significant osseous findings. IMPRESSION: 1. Multiple foreign bodies in the stomach and colon as detailed above, without evidence of perforation, obstruction, regional inflammatory change, or bowel wall thickening. Electronically Signed   By: Corlis Leak M.D.   On: 04/29/2018 12:31   Dg Abdomen Acute W/chest  Result Date: 04/29/2018 CLINICAL DATA:  Foreign body ingestion EXAM: DG ABDOMEN ACUTE W/ 1V CHEST COMPARISON:  04/27/08 FINDINGS: No abnormally dilated loops of bowel identified. There are multiple metallic radiopaque foreign bodies identified within the abdomen. Within the right lower quadrant of the  abdomen there are approximately 5 foreign bodies including a screw, 2 batteries, and several discoid foreign bodies which may represent coins. In the left abdomen there are 3 metallic foreign bodies including a screw, a battery and presumed coin. Within the central abdomen there is a less dense, round foreign body which measures approximately 4.2 cm. Heart size appears normal. No pleural effusion. The lungs are clear. IMPRESSION: Multiple radiodense foreign bodies are identified in the distribution of the ascending colon, transverse colon and descending colon. Foreign bodies include several batteries, screws and possibly several coring. No evidence to suggest bowel obstruction. Electronically Signed   By: Signa Kell M.D.   On: 04/29/2018 11:36    Microbiology: No results found for this or any previous visit (from the past 240 hour(s)).   Labs: Basic Metabolic Panel: No results for input(s): NA, K, CL, CO2, GLUCOSE, BUN, CREATININE, CALCIUM, MG, PHOS in the last 168 hours. Liver Function Tests: No results for input(s): AST, ALT, ALKPHOS, BILITOT, PROT, ALBUMIN in the last 168 hours. No results for input(s): LIPASE, AMYLASE in the last 168 hours. No results for input(s): AMMONIA in the last 168 hours.   CBC Latest Ref Rng & Units  05/06/2018 05/03/2018 04/29/2018  WBC 4.0 - 10.5 K/uL 5.3 5.0 4.5  Hemoglobin 13.0 - 17.0 g/dL 11.2(L) 11.8(L) 12.5(L)  Hematocrit 39.0 - 52.0 % 37.0(L) 39.7 41.7  Platelets 150 - 400 K/uL 204 226 222     Cardiac Enzymes: No results for input(s): CKTOTAL, CKMB, CKMBINDEX, TROPONINI in the last 168 hours. BNP: BNP (last 3 results) No results for input(s): BNP in the last 8760 hours.  ProBNP (last 3 results) No results for input(s): PROBNP in the last 8760 hours.  CBG: No results for input(s): GLUCAP in the last 168 hours.     Signed:  Laverna Peace, MD [facility] 05/10/2018, 5:25 PM

## 2018-05-11 DIAGNOSIS — F250 Schizoaffective disorder, bipolar type: Secondary | ICD-10-CM | POA: Diagnosis not present

## 2018-05-12 DIAGNOSIS — F250 Schizoaffective disorder, bipolar type: Secondary | ICD-10-CM | POA: Diagnosis not present

## 2018-05-12 DIAGNOSIS — Z79899 Other long term (current) drug therapy: Secondary | ICD-10-CM | POA: Diagnosis not present

## 2018-05-27 DIAGNOSIS — Z23 Encounter for immunization: Secondary | ICD-10-CM | POA: Diagnosis not present

## 2018-05-27 DIAGNOSIS — Y999 Unspecified external cause status: Secondary | ICD-10-CM | POA: Diagnosis not present

## 2018-05-27 DIAGNOSIS — S1181XA Laceration without foreign body of other specified part of neck, initial encounter: Secondary | ICD-10-CM | POA: Diagnosis not present

## 2018-05-27 DIAGNOSIS — F319 Bipolar disorder, unspecified: Secondary | ICD-10-CM | POA: Diagnosis not present

## 2018-05-27 DIAGNOSIS — F209 Schizophrenia, unspecified: Secondary | ICD-10-CM | POA: Diagnosis not present

## 2018-05-27 DIAGNOSIS — X58XXXA Exposure to other specified factors, initial encounter: Secondary | ICD-10-CM | POA: Diagnosis not present

## 2018-05-27 DIAGNOSIS — T189XXA Foreign body of alimentary tract, part unspecified, initial encounter: Secondary | ICD-10-CM | POA: Diagnosis not present

## 2018-05-27 DIAGNOSIS — R6250 Unspecified lack of expected normal physiological development in childhood: Secondary | ICD-10-CM | POA: Diagnosis not present

## 2018-06-01 ENCOUNTER — Ambulatory Visit: Payer: Medicare Other | Admitting: Podiatry

## 2018-06-02 ENCOUNTER — Emergency Department (HOSPITAL_COMMUNITY)
Admission: EM | Admit: 2018-06-02 | Discharge: 2018-06-05 | Disposition: A | Discharge status: Home / Self Care | Payer: Medicare Other | Attending: Emergency Medicine | Admitting: Emergency Medicine

## 2018-06-02 ENCOUNTER — Encounter (HOSPITAL_COMMUNITY): Payer: Self-pay | Admitting: Emergency Medicine

## 2018-06-02 ENCOUNTER — Emergency Department (HOSPITAL_COMMUNITY): Payer: Medicare Other

## 2018-06-02 DIAGNOSIS — Z9114 Patient's other noncompliance with medication regimen: Secondary | ICD-10-CM | POA: Diagnosis not present

## 2018-06-02 DIAGNOSIS — R1012 Left upper quadrant pain: Secondary | ICD-10-CM | POA: Diagnosis not present

## 2018-06-02 DIAGNOSIS — R440 Auditory hallucinations: Secondary | ICD-10-CM | POA: Diagnosis present

## 2018-06-02 DIAGNOSIS — R45851 Suicidal ideations: Secondary | ICD-10-CM

## 2018-06-02 DIAGNOSIS — F200 Paranoid schizophrenia: Secondary | ICD-10-CM | POA: Diagnosis not present

## 2018-06-02 DIAGNOSIS — R1010 Upper abdominal pain, unspecified: Secondary | ICD-10-CM | POA: Insufficient documentation

## 2018-06-02 DIAGNOSIS — F17210 Nicotine dependence, cigarettes, uncomplicated: Secondary | ICD-10-CM | POA: Diagnosis not present

## 2018-06-02 DIAGNOSIS — R443 Hallucinations, unspecified: Secondary | ICD-10-CM

## 2018-06-02 DIAGNOSIS — F250 Schizoaffective disorder, bipolar type: Secondary | ICD-10-CM | POA: Diagnosis present

## 2018-06-02 DIAGNOSIS — Z79899 Other long term (current) drug therapy: Secondary | ICD-10-CM | POA: Diagnosis not present

## 2018-06-02 DIAGNOSIS — T182XXA Foreign body in stomach, initial encounter: Secondary | ICD-10-CM | POA: Diagnosis not present

## 2018-06-02 DIAGNOSIS — F209 Schizophrenia, unspecified: Secondary | ICD-10-CM | POA: Diagnosis not present

## 2018-06-02 DIAGNOSIS — I499 Cardiac arrhythmia, unspecified: Secondary | ICD-10-CM | POA: Diagnosis not present

## 2018-06-02 LAB — COMPREHENSIVE METABOLIC PANEL
ALT: 14 U/L (ref 0–44)
AST: 23 U/L (ref 15–41)
Albumin: 3.9 g/dL (ref 3.5–5.0)
Alkaline Phosphatase: 79 U/L (ref 38–126)
Anion gap: 11 (ref 5–15)
BUN: 15 mg/dL (ref 6–20)
CO2: 26 mmol/L (ref 22–32)
Calcium: 9.2 mg/dL (ref 8.9–10.3)
Chloride: 103 mmol/L (ref 98–111)
Creatinine, Ser: 1.15 mg/dL (ref 0.61–1.24)
GFR calc Af Amer: >60 mL/min (ref >60)
GFR calc non Af Amer: >60 mL/min (ref >60)
Glucose, Bld: 119 mg/dL — ABNORMAL HIGH (ref 70–99)
Potassium: 3.8 mmol/L (ref 3.5–5.1)
SODIUM: 140 mmol/L (ref 135–145)
Total Bilirubin: 0.2 mg/dL — ABNORMAL LOW (ref 0.3–1.2)
Total Protein: 6.6 g/dL (ref 6.5–8.1)

## 2018-06-02 LAB — CBC WITH DIFFERENTIAL/PLATELET
Abs Immature Granulocytes: 0.01 10*3/uL (ref 0.00–0.07)
Basophils Absolute: 0 10*3/uL (ref 0.0–0.1)
Basophils Relative: 1 %
Eosinophils Absolute: 0.1 10*3/uL (ref 0.0–0.5)
Eosinophils Relative: 1 %
HCT: 41.2 % (ref 39.0–52.0)
Hemoglobin: 12.5 g/dL — ABNORMAL LOW (ref 13.0–17.0)
Immature Granulocytes: 0 %
Lymphocytes Relative: 21 %
Lymphs Abs: 1.3 10*3/uL (ref 0.7–4.0)
MCH: 26.7 pg (ref 26.0–34.0)
MCHC: 30.3 g/dL (ref 30.0–36.0)
MCV: 87.8 fL (ref 80.0–100.0)
Monocytes Absolute: 0.6 10*3/uL (ref 0.1–1.0)
Monocytes Relative: 9 %
Neutro Abs: 4.1 10*3/uL (ref 1.7–7.7)
Neutrophils Relative %: 68 %
Platelets: 239 10*3/uL (ref 150–400)
RBC: 4.69 MIL/uL (ref 4.22–5.81)
RDW: 13.2 % (ref 11.5–15.5)
WBC: 6.1 10*3/uL (ref 4.0–10.5)
nRBC: 0 % (ref 0.0–0.2)

## 2018-06-02 LAB — RAPID URINE DRUG SCREEN, HOSP PERFORMED
Amphetamines: NOT DETECTED
Barbiturates: NOT DETECTED
Benzodiazepines: NOT DETECTED
Cocaine: NOT DETECTED
Opiates: NOT DETECTED
TETRAHYDROCANNABINOL: NOT DETECTED

## 2018-06-02 LAB — SALICYLATE LEVEL

## 2018-06-02 LAB — ACETAMINOPHEN LEVEL: Acetaminophen (Tylenol), Serum: <10 ug/mL — ABNORMAL LOW (ref 10–30)

## 2018-06-02 LAB — ETHANOL: Alcohol, Ethyl (B): <10 mg/dL (ref <10)

## 2018-06-02 MED ORDER — PANTOPRAZOLE SODIUM 40 MG PO TBEC: 40.0000 mg | DELAYED_RELEASE_TABLET | Freq: Every day | ORAL | Status: DC

## 2018-06-02 MED ORDER — POLYETHYLENE GLYCOL 3350 17 G PO PACK
17.0000 g | PACK | Freq: Every day | ORAL | Status: DC
  Filled 2018-06-02 (×4): qty 1

## 2018-06-02 MED ORDER — PALIPERIDONE PALMITATE ER 234 MG/1.5ML IM SUSY
234.0000 mg | PREFILLED_SYRINGE | Freq: Once | INTRAMUSCULAR | Status: AC
  Administered 2018-06-03: 234 mg via INTRAMUSCULAR
  Filled 2018-06-02: qty 1.5

## 2018-06-02 MED ORDER — VITAMIN D 25 MCG (1000 UNIT) PO TABS
2000.0000 [IU] | ORAL_TABLET | Freq: Every day | ORAL | Status: DC
  Administered 2018-06-03 – 2018-06-04 (×2): 2000 [IU] via ORAL
  Filled 2018-06-02 (×4): qty 2

## 2018-06-02 MED ORDER — ALUM & MAG HYDROXIDE-SIMETH 200-200-20 MG/5ML PO SUSP
15.0000 mL | Freq: Four times a day (QID) | ORAL | Status: DC | PRN
  Administered 2018-06-02: 15 mL via ORAL
  Filled 2018-06-02: qty 30

## 2018-06-02 MED ORDER — SERTRALINE HCL 20 MG/ML PO CONC
100.0000 mg | Freq: Every day | ORAL | Status: DC
  Filled 2018-06-02: qty 5

## 2018-06-02 MED ORDER — LITHIUM CARBONATE ER 300 MG PO TBCR
300.0000 mg | EXTENDED_RELEASE_TABLET | Freq: Every day | ORAL | Status: DC
  Administered 2018-06-03 – 2018-06-05 (×3): 300 mg via ORAL
  Filled 2018-06-02 (×3): qty 1

## 2018-06-02 MED ORDER — BENZTROPINE MESYLATE 0.5 MG PO TABS
0.5000 mg | ORAL_TABLET | Freq: Every day | ORAL | Status: DC
  Administered 2018-06-02 – 2018-06-05 (×4): 0.5 mg via ORAL
  Filled 2018-06-02 (×4): qty 1

## 2018-06-02 MED ORDER — PANTOPRAZOLE SODIUM 40 MG PO TBEC
40.0000 mg | DELAYED_RELEASE_TABLET | Freq: Every day | ORAL | Status: DC
  Administered 2018-06-03 – 2018-06-05 (×3): 40 mg via ORAL
  Filled 2018-06-02 (×3): qty 1

## 2018-06-02 MED ORDER — LITHIUM CARBONATE ER 300 MG PO TBCR
600.0000 mg | EXTENDED_RELEASE_TABLET | Freq: Every day | ORAL | Status: DC
  Administered 2018-06-02 – 2018-06-04 (×3): 600 mg via ORAL
  Filled 2018-06-02 (×3): qty 2

## 2018-06-02 MED ORDER — CLOZAPINE 25 MG PO TABS
250.0000 mg | ORAL_TABLET | Freq: Every day | ORAL | Status: DC
  Administered 2018-06-02 – 2018-06-04 (×3): 250 mg via ORAL
  Filled 2018-06-02 (×3): qty 2

## 2018-06-02 MED ORDER — ALBUTEROL SULFATE HFA 108 (90 BASE) MCG/ACT IN AERS: 2.0000 | INHALATION_SPRAY | Freq: Four times a day (QID) | RESPIRATORY_TRACT | Status: DC | PRN

## 2018-06-02 MED ORDER — ONDANSETRON 4 MG PO TBDP
4.0000 mg | ORAL_TABLET | Freq: Once | ORAL | Status: AC
  Administered 2018-06-02: 4 mg via ORAL
  Filled 2018-06-02: qty 1

## 2018-06-02 MED ORDER — DOCUSATE SODIUM 100 MG PO CAPS
200.0000 mg | ORAL_CAPSULE | Freq: Two times a day (BID) | ORAL | Status: DC
  Administered 2018-06-02 – 2018-06-05 (×6): 200 mg via ORAL
  Filled 2018-06-02 (×6): qty 2

## 2018-06-02 MED ORDER — SERTRALINE HCL 50 MG PO TABS
100.0000 mg | ORAL_TABLET | Freq: Every day | ORAL | Status: DC
  Administered 2018-06-03 – 2018-06-05 (×3): 100 mg via ORAL
  Filled 2018-06-02 (×3): qty 2

## 2018-06-02 NOTE — ED Notes (Signed)
Pt was encouraged to provide urine sample. 
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
## 2018-06-02 NOTE — ED Notes (Signed)
Pt moved to 42.  Pt is calm and cooperative.  Pt oriented to room and unit.

## 2018-06-02 NOTE — ED Notes (Signed)
Pt alert and oriented. Pt states " I want to kill myself. When asked if he had a plan he stated " I don't know yet" Pt c/o auditory hallucinations. Pt deny any visual hallucination, and HI. Pt contract to safety. Pt resting in bed, pt safe. Will continue to monitor.

## 2018-06-02 NOTE — ED Provider Notes (Signed)
[facility]-EMERGENCY DEPT Provider Note   CSN: [id number] Arrival date & time: 06/02/18  1316     History   Chief Complaint Chief Complaint  Patient presents with  . Medical Clearance    HPI Lawrence Morgan is a 38 y.o. male.  Lawrence FilaRodney Ringel is a 38 y.o. male with a history of schizoaffective disorder, PTSD, personality disorder and intellectual disability, who presents to the emergency department for evaluation of suicidal ideations.  Patient reports he has a desire to kill himself but denies a specific plan.  He reports having auditory hallucinations telling him to hurt himself.  Denies any visual hallucinations.  Patient initially reported homicidal ideation but then reports he only wants to hurt himself.  Patient was recently admitted after attempting to kill himself by swelling approximately 20 batteries, these were retrieved by GI, patient also found to have a gastric stone at this time which was unable to be removed endoscopically, surgery was consulted and recommended conservative management.  Patient reports he is having some epigastric discomfort today but is not having any vomiting, he is not having any trouble passing gas or stools.  Denies any fevers.  No chest pain or shortness of breath.  Reports that he has been using tobacco regularly, he reports that this seems to "keep the voices it back".  But he denies alcohol use or other drug use.     Past Medical History:  Diagnosis Date  . Intellectual disability    mild  . Marijuana use   . Personality disorder (HCC)   . PTSD (post-traumatic stress disorder)   . 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 Schizoaffective disorder, bipolar type [facility](HCC)     Patient Active Problem List   Diagnosis Date Noted  . Ingestion of foreign body, initial encounter 04/29/2018  . 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 Suicide ideation 04/29/2018  . Schizoaffective disorder, bipolar type (HCC)   .  PTSD (post-traumatic stress disorder)     Past Surgical History:  Procedure Laterality Date  . ESOPHAGOGASTRODUODENOSCOPY (EGD) WITH PROPOFOL N/A 04/30/2018   Procedure: ESOPHAGOGASTRODUODENOSCOPY (EGD) WITH PROPOFOL;  Surgeon: Kathi DerBrahmbhatt, Parag, MD;  Location: [facility] ENDOSCOPY;  Service: Gastroenterology;  Laterality: N/A;  . FOREIGN BODY REMOVAL N/A 04/30/2018   Procedure: FOREIGN BODY REMOVAL;  Surgeon: Kathi DerBrahmbhatt, Parag, MD;  Location: [facility] ENDOSCOPY;  Service: Gastroenterology;  Laterality: N/A;  . FRACTURE SURGERY      r ankle         Home Medications    Prior to Admission medications   Medication Sig Start Date End Date Taking? Authorizing Provider  atomoxetine (STRATTERA) 25 MG capsule Take 25 mg by mouth daily.   Yes [provider]  benztropine (COGENTIN) 0.5 MG tablet Take 0.5 mg by mouth daily.   Yes [provider]  cetirizine (ZYRTEC) 10 MG tablet Take 10 mg by mouth daily.   Yes [provider]  Cholecalciferol (VITAMIN D3) 50 MCG (2000 UT) capsule Take 2,000 Units by mouth daily.   Yes [provider]  cloZAPine (CLOZARIL) 100 MG tablet Take 2.5 tablets (250 mg total) by mouth at bedtime. 05/10/18  Yes Laverna PeaceNettey, Shayla D, MD  Docusate Sodium 100 MG capsule Take 200 mg by mouth 2 (two) times daily.   Yes [provider]  lithium carbonate 150 MG capsule Take 3 capsules (450 mg total) by mouth 2 (two) times daily with a meal. Take 1 tablet (300 mg totally) by mouth in the morning; take 2 tablets (600 mg totally)  by mouth at bed time 05/10/18  Yes Roberto Scales D, MD  paliperidone (INVEGA SUSTENNA) 234 MG/1.5ML SUSY injection Inject 234 mg into the muscle every 30 (thirty) days.   Yes [provider]  pantoprazole (PROTONIX) 40 MG tablet Take 1 tablet (40 mg total) by mouth daily. 05/10/18  Yes Roberto Scales D, MD  polyethylene glycol (MIRALAX / GLYCOLAX) packet Take 17 g by mouth daily.   Yes [provider]    sertraline (ZOLOFT) 100 MG tablet Take 100 mg by mouth daily.   Yes [provider]  albuterol (PROVENTIL HFA;VENTOLIN HFA) 108 (90 Base) MCG/ACT inhaler Inhale 2 puffs into the lungs every 6 (six) hours as needed for wheezing or shortness of breath.    [provider]  hydrOXYzine (ATARAX/VISTARIL) 25 MG tablet Take 25 mg by mouth 3 (three) times daily as needed for anxiety.    [provider]    Family History History reviewed. No pertinent family history.  Social History Social History   Tobacco Use  . Smoking status: Current Every Day Smoker    Types: Cigars, Cigarettes  . Smokeless tobacco: Never Used  Substance Use Topics  . Alcohol use: Not Currently  . Drug use: Yes    Frequency: 3.0 times per week    Types: Marijuana     Allergies   Haldol [haloperidol lactate]; Thorazine [chlorpromazine]; and Shellfish allergy   Review of Systems Review of Systems  Constitutional: Negative for chills and fever.  HENT: Negative.   Eyes: Negative for visual disturbance.  Respiratory: Negative for cough and shortness of breath.   Cardiovascular: Negative for chest pain.  Gastrointestinal: Positive for abdominal pain and nausea. Negative for blood in stool, constipation, diarrhea and vomiting.  Genitourinary: Negative for dysuria and frequency.  Musculoskeletal: Negative for arthralgias and myalgias.  Skin: Negative for color change, rash and wound.  Neurological: Negative for dizziness, syncope, light-headedness and headaches.  Psychiatric/Behavioral: Positive for dysphoric mood, hallucinations and suicidal ideas.     Physical Exam Updated Vital Signs BP (!) 142/82   Pulse 86   Temp 97.6 F (36.4 C) (Oral)   Resp 18   Ht 5\' 10"  (1.778 m)   Wt 85.3 kg   SpO2 100%   BMI 26.98 kg/m   Physical Exam Vitals signs and nursing note reviewed.  Constitutional:      General: He is not in acute distress.    Appearance: He is well-developed. He is not  diaphoretic.  HENT:     Head: Normocephalic and atraumatic.     Mouth/Throat:     Mouth: Mucous membranes are moist.     Pharynx: Oropharynx is clear.  Eyes:     General:        Right eye: No discharge.        Left eye: No discharge.     Pupils: Pupils are equal, round, and reactive to light.  Neck:     Musculoskeletal: Neck supple.  Cardiovascular:     Rate and Rhythm: Normal rate and regular rhythm.     Pulses: Normal pulses.     Heart sounds: Normal heart sounds. No murmur. No friction rub. No gallop.   Pulmonary:     Effort: Pulmonary effort is normal. No respiratory distress.     Breath sounds: Normal breath sounds. No wheezing or rales.     Comments: Respirations equal and unlabored, patient able to speak in full sentences, lungs clear to auscultation bilaterally Abdominal:     General: Abdomen  is flat. Bowel sounds are normal. There is no distension.     Palpations: Abdomen is soft. There is no mass.     Tenderness: There is abdominal tenderness. There is no guarding.     Comments: Abdomen is soft, nondistended, bowel sounds present throughout, there is some tenderness in the epigastric and left upper quadrant without guarding or rebound tenderness, no peritoneal signs, no rigidity.  Musculoskeletal:        General: No deformity.  Skin:    General: Skin is warm and dry.     Capillary Refill: Capillary refill takes less than 2 seconds.  Neurological:     Mental Status: He is alert and oriented to person, place, and time. Mental status is at baseline.     Coordination: Coordination normal.     Comments: Speech is clear, able to follow commands CN III-XII intact Normal strength in upper and lower extremities bilaterally including dorsiflexion and plantar flexion, strong and equal grip strength Sensation normal to light and sharp touch Moves extremities without ataxia, coordination intact  Psychiatric:        Mood and Affect: Mood normal. Affect is labile.        Speech:  Speech is rapid and pressured.        Behavior: Behavior is slowed and withdrawn. Behavior is cooperative.        Thought Content: Thought content includes suicidal ideation. Thought content does not include homicidal ideation. Thought content does not include suicidal plan.      ED Treatments / Results  Labs (all labs ordered are listed, but only abnormal results are displayed) Labs Reviewed  CBC WITH DIFFERENTIAL/PLATELET - Abnormal; Notable for the following components:      Result Value   Hemoglobin 12.5 (*)    All other components within normal limits  COMPREHENSIVE METABOLIC PANEL - Abnormal; Notable for the following components:   Glucose, Bld 119 (*)    Total Bilirubin 0.2 (*)    All other components within normal limits  ACETAMINOPHEN LEVEL - Abnormal; Notable for the following components:   Acetaminophen (Tylenol), Serum <10 (*)    All other components within normal limits  RAPID URINE DRUG SCREEN, HOSP PERFORMED  SALICYLATE LEVEL  ETHANOL    EKG None  Radiology Dg Abdomen Acute W/chest  Result Date: 06/02/2018 CLINICAL DATA:  History of foreign body ingestion. Epigastric discomfort. EXAM: DG ABDOMEN ACUTE W/ 1V CHEST COMPARISON:  05/27/2018.  CT, 04/29/2018. FINDINGS: 4.5 cm oval radiopaque foreign body projects centrally on the erect view and in the right upper quadrant on the supine view. This was present on the prior radiographs and is unchanged. Normal bowel gas pattern. No free air. No other radiopaque foreign bodies. No evidence of renal or ureteral stones. Heart, mediastinum and hila are normal.  Lungs are clear. Skeletal structures are unremarkable. IMPRESSION: 1. No acute findings.  No evidence of bowel obstruction or free air. 2. Persistent 4.5 cm oval radiopaque foreign body in the central abdomen. 3. No other abnormalities.  Normal frontal chest radiograph. Electronically Signed   By: Amie Portland M.D.   On: 06/02/2018 17:55    Procedures Procedures  (including critical care time)  Medications Ordered in ED Medications  alum & mag hydroxide-simeth (MAALOX/MYLANTA) 200-200-20 MG/5ML suspension 15 mL (15 mLs Oral Given 06/02/18 1509)  lithium carbonate (LITHOBID) CR tablet 300 mg (has no administration in time range)    And  lithium carbonate (LITHOBID) CR tablet 600 mg (600 mg Oral Given 06/02/18  2156)  cloZAPine (CLOZARIL) tablet 250 mg (250 mg Oral Given 06/02/18 2155)  benztropine (COGENTIN) tablet 0.5 mg (0.5 mg Oral Given 06/02/18 1831)  cholecalciferol (VITAMIN D3) tablet 2,000 Units (0 Units Oral Hold 06/02/18 1856)  sertraline (ZOLOFT) tablet 100 mg (0 mg Oral Hold 06/02/18 1855)  paliperidone (INVEGA SUSTENNA) injection 234 mg (has no administration in time range)  albuterol (PROVENTIL HFA;VENTOLIN HFA) 108 (90 Base) MCG/ACT inhaler 2 puff (has no administration in time range)  docusate sodium (COLACE) capsule 200 mg (200 mg Oral Given 06/02/18 2156)  pantoprazole (PROTONIX) EC tablet 40 mg (40 mg Oral Not Given 06/02/18 1917)  polyethylene glycol (MIRALAX / GLYCOLAX) packet 17 g (17 g Oral Not Given 06/02/18 1917)  ondansetron (ZOFRAN-ODT) disintegrating tablet 4 mg (4 mg Oral Given 06/02/18 1831)     Initial Impression / Assessment and Plan / ED Course  I have reviewed the triage vital signs and the nursing notes.  Pertinent labs & imaging results that were available during my care of the patient were reviewed by me and considered in my medical decision making (see chart for details).  Patient presents to the emergency department for evaluation of suicidal ideations without specific plan.  Complaining of auditory hallucinations history of the same.  Patient reports that anytime he stops smoking cigarettes the seem to get worse.  He reports prior history of marijuana use, denies any other alcohol or drug use.  Patient complains of epigastric abdominal discomfort, has recent history of ingesting several batteries and was admitted to the hospital  and batteries were removed, patient found to have a gastric stone during this admission which was not removed, continuing to treat symptomatically and monitor for signs of obstruction.  Patient passing bowels without difficulty, no vomiting. Vitals stable on arrival, no chest pain or shortness of breath, fevers or chills, headaches or vision changes.  No focal findings on exam.  Patient will benefit from psychiatric evaluation.  Medical screening labs obtained and TTS consult placed.  Labs reassuring, no leukocytosis, stable hemoglobin, no acute electrolyte derangements, normal creatinine and liver function, negative salicylate, acetaminophen and ethanol levels.  UDS negative.  EKG without concerning ischemic changes.  Acute abdominal series shows no evidence of perforation and normal bowel gas pattern, persistent 4.2 cm Pacitti noted which was seen before, patient has known gastric stone, this was evaluated by GI and general surgery during recent admission, unable to be removed endoscopically, and recommended to leave it alone and continue to treat with PPI and monitor for any signs of perforation or obstruction.  At this time patient is medically cleared for psychiatric evaluation, patient placed under ED psych hold, awaiting TTS recommendations for appropriate disposition.  TTS recommends overnight observation and reevaluation in the morning.  Final Clinical Impressions(s) / ED Diagnoses   Final diagnoses:  Suicidal ideation  Hallucinations  Upper abdominal pain    ED Discharge Orders    None       Legrand Rams 06/03/18 0009    Lorre Nick, MD 06/03/18 [phone]

## 2018-06-02 NOTE — BH Assessment (Addendum)
Assessment Note  Lawrence Morgan is an 38 y.o. male that presents this date voluntary for S/I. Patient is observed to be very disorganized and delusional stating he is having thoughts of self harm although when ask to elaborate on plan patient states he is "being shredded up." Patient will not clarify on content of statement. Patient renders a conflicting history stating [facility] his father Tawni Carnes(Ricky Gaymon) who he reports to be his guardian after being asked to leave his group home ([facility]) in [place] on 05/15/18. Patient reports he is IDD but denies any mental health disorder. Patient later states [facility] after his father asked him to leave his residence where he had been staying for the [place]. This Clinical research associatewriter attempted to [facility] and father unsuccessfully. Patient reported he ingested 20 AA batteries on 05/15/18 that are "still in him" and [facility] although that cannot be confirmed. Patient denies any H/I or AVH. Patient states he was diagnosed as "being slow" years ago and has been residing at the above group home for the last three years until he eloped on 05/18/18 to reside with his father. Patient states he has been off his medications for over five days although cannot recall what those medications are or what they are prescribed for. Patient does not appear to be responding to any internal stimuli at the time of assessment. Patient denies any current mental health diagnosis or symptoms. Patient states he has used Cannabis in the past although cannot recall last use. Patient denies any other SA history. UDS is pending. History is limited on this patient as patient is observed to be very disorganized and confused. Patient keeps his head covered up during assessment and speaks in a low soft voice being difficult to understand at times. Patient is observed to be delusional and keeps repeating  that he has recently "lost over 3780 family members." Per notes, patient states he is here to get some help. When asked what does he need help for he stated "I've been run through the shredder 8 times. I am cut from my waste down and my toes have been cut off. I haven't had my medications in five days." Case was staffed with Arville CareParks FNP who recommended patient be observed and monitored.    Diagnosis: F25.0 Schizoaffective, Bipolar type, IDD (per chart)   Past Medical History:  Past Medical History:  Diagnosis Date  . Intellectual disability    mild  . Marijuana use   . Personality disorder (HCC)   . PTSD (post-traumatic stress disorder)   . 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 Schizoaffective disorder, bipolar type [facility](HCC)     Past Surgical History:  Procedure Laterality Date  . ESOPHAGOGASTRODUODENOSCOPY (EGD) WITH PROPOFOL N/A 04/30/2018   Procedure: ESOPHAGOGASTRODUODENOSCOPY (EGD) WITH PROPOFOL;  Surgeon: Kathi DerBrahmbhatt, Parag, MD;  Location: [facility];  Service: Gastroenterology;  Laterality: N/A;  . FOREIGN BODY REMOVAL N/A 04/30/2018   Procedure: FOREIGN BODY REMOVAL;  Surgeon: Kathi DerBrahmbhatt, Parag, MD;  Location: [facility];  Service: Gastroenterology;  Laterality: N/A;  . FRACTURE SURGERY      r ankle     Family History: History reviewed. No pertinent family history.  Social History:  reports that he has been smoking cigars and cigarettes. He has never used smokeless tobacco. He reports previous alcohol use. He reports current drug use. Frequency: 3.00 times per week. Drug: Marijuana.  Additional Social History:  Alcohol /  Drug Use Pain Medications: See MAR Prescriptions: See MAR Over the Counter: See MAR History of alcohol / drug use?: Yes Longest period of sobriety (when/how long): UTA Negative Consequences of Use: (Denies) Withdrawal Symptoms: (Denies) Substance #1 Name of Substance 1: Cannabis per hx 1 - Age of First Use: UTA 1 - Amount (size/oz): UTA 1 -  Frequency: UTA 1 - Duration: UTA 1 - Last Use / Amount: UTA   CIWA: CIWA-Ar BP: (!) 142/82 Pulse Rate: 86 COWS:    Allergies:  Allergies  Allergen Reactions  . Haldol [Haloperidol Lactate]   . Thorazine [Chlorpromazine]   . Shellfish Allergy Itching and Rash    Home Medications: (Not in a hospital admission)   OB/GYN Status:  No LMP for male patient.  General Assessment Data Location of Assessment: [facility] ED TTS Assessment: In system Is this a Tele or Face-to-Face Assessment?: Face-to-Face Is this an Initial Assessment or a Re-assessment for this encounter?: Initial Assessment Patient Accompanied by:: (NA) Language Other than English: No Living Arrangements: (Group home) What gender do you identify as?: Male Marital status: Single Maiden name: NA Pregnancy Status: No Living Arrangements: Group Home Can pt return to current living arrangement?: Yes Admission Status: Voluntary Is patient capable of signing voluntary admission?: Yes Referral Source: Self/Family/Friend Insurance type: Medicare  Medical Screening Exam [facility] Walk-in ONLY) Medical Exam completed: Yes  Crisis Care Plan Living Arrangements: Group Home Legal Guardian: Father(Ricky Gaymon) Name of Psychiatrist: UTA Name of Therapist: UTA  Education Status Is patient currently in school?: No Is the patient employed, unemployed or receiving disability?: Receiving disability income  Risk to self with the past 6 months Suicidal Ideation: Yes-Currently Present Has patient been a risk to self within the past 6 months prior to admission? : No Suicidal Intent: Yes-Currently Present Has patient had any suicidal intent within the past 6 months prior to admission? : No Is patient at risk for suicide?: Yes Suicidal Plan?: Yes-Currently Present Has patient had any suicidal plan within the past 6 months prior to admission? : No Specify Current Suicidal Plan: Pt states he will "shred himself" Access to Means: (UTA  ) What has been your use of drugs/alcohol within the last 12 months?: Past use of Cannabis Previous Attempts/Gestures: (UTA) How many times?: (UTA) Other Self Harm Risks: Off medications Triggers for Past Attempts: (UTA) Intentional Self Injurious Behavior: None Family Suicide History: No Recent stressful life event(s): (Off medications) Persecutory voices/beliefs?: No Depression: (Pt denies) Depression Symptoms: (Pt denies) Substance abuse history and/or treatment for substance abuse?: No Suicide prevention information given to non-admitted patients: Not applicable  Risk to Others within the past 6 months Homicidal Ideation: No Does patient have any lifetime risk of violence toward others beyond the six months prior to admission? : No Thoughts of Harm to Others: No Current Homicidal Intent: No Current Homicidal Plan: No Access to Homicidal Means: No Identified Victim: NA History of harm to others?: No Assessment of Violence: None Noted Violent Behavior Description: NA Does patient have access to weapons?: No Criminal Charges Pending?: No Does patient have a court date: No Is patient on probation?: No  Psychosis Hallucinations: None noted Delusions: Persecutory  Mental Status Report Appearance/Hygiene: In scrubs Eye Contact: Unable to Assess Motor Activity: Unremarkable Speech: Soft, Slow Level of Consciousness: Drowsy Mood: Preoccupied Affect: Flat Anxiety Level: Minimal Thought Processes: Unable to Assess Judgement: Unable to Assess Orientation: Unable to assess Obsessive Compulsive Thoughts/Behaviors: Unable to Assess  Cognitive Functioning Concentration: Unable to Assess Memory: Unable to  Assess Is patient IDD: Yes(per pt) Level of Function: Unknown Is IQ score available?: (Unknown) Insight: Unable to Assess Impulse Control: Unable to Assess Appetite: (UTA) Have you had any weight changes? : (UTA) Sleep: (UTA) Total Hours of Sleep: (UTA) Vegetative  Symptoms: Unable to Assess  ADLScreening [facility] Assessment Services) Patient's cognitive ability adequate to safely complete daily activities?: Yes Patient able to express need for assistance with ADLs?: Yes Independently performs ADLs?: Yes (appropriate for developmental age)  Prior Inpatient Therapy Prior Inpatient Therapy: Yes Prior Therapy Dates: 2019,2018 Prior Therapy Facilty/Provider(s): [facility], [facility](Per pt) Reason for Treatment: MH issues  Prior Outpatient Therapy Prior Outpatient Therapy: (UTA)  ADL Screening (condition at time of admission) Patient's cognitive ability adequate to safely complete daily activities?: Yes Is the patient deaf or have difficulty hearing?: No Does the patient have difficulty seeing, even when wearing glasses/contacts?: No Does the patient have difficulty concentrating, remembering, or making decisions?: Yes Patient able to express need for assistance with ADLs?: Yes Does the patient have difficulty dressing or bathing?: No Independently performs ADLs?: Yes (appropriate for developmental age) Does the patient have difficulty walking or climbing stairs?: No Weakness of Legs: None Weakness of Arms/Hands: None  Home Assistive Devices/Equipment Home Assistive Devices/Equipment: None  Therapy Consults (therapy consults require a physician order) PT Evaluation Needed: No OT Evalulation Needed: No SLP Evaluation Needed: No Abuse/Neglect Assessment (Assessment to be complete while patient is alone) Physical Abuse: Denies Verbal Abuse: Denies Sexual Abuse: Denies Exploitation of patient/patient's resources: Denies Self-Neglect: Denies Values / Beliefs Cultural Requests During Hospitalization: None Spiritual Requests During Hospitalization: None Consults Spiritual Care Consult Needed: No Social Work Consult Needed: No Merchant navy officer (For Healthcare) Does Patient Have a Medical Advance Directive?: No Would patient like information on creating a  medical advance directive?: No - Patient declined          Disposition: Case was staffed with Arville Care FNP who recommended patient be observed and monitored.    Disposition Initial Assessment Completed for this Encounter: Yes Disposition of Patient: (Observe and monitor) Patient refused recommended treatment: No Mode of transportation if patient is discharged/movement?: (Unk)  On Site Evaluation by:   Reviewed with Physician:    Alfredia Ferguson 06/02/2018 4:35 PM

## 2018-06-02 NOTE — BH Assessment (Signed)
[facility] Assessment Progress Note Case was staffed with Parks FNP who recommended patient be observed and monitored.             

## 2018-06-02 NOTE — ED Notes (Signed)
Pt reminded to give urine sample. 

## 2018-06-03 DIAGNOSIS — F250 Schizoaffective disorder, bipolar type: Secondary | ICD-10-CM | POA: Diagnosis not present

## 2018-06-03 DIAGNOSIS — R1010 Upper abdominal pain, unspecified: Secondary | ICD-10-CM | POA: Diagnosis not present

## 2018-06-03 DIAGNOSIS — R45851 Suicidal ideations: Secondary | ICD-10-CM

## 2018-06-03 DIAGNOSIS — R443 Hallucinations, unspecified: Secondary | ICD-10-CM | POA: Diagnosis not present

## 2018-06-03 MED ORDER — HYDROXYZINE HCL 25 MG PO TABS
25.0000 mg | ORAL_TABLET | Freq: Three times a day (TID) | ORAL | Status: DC
  Administered 2018-06-03 – 2018-06-05 (×6): 25 mg via ORAL
  Filled 2018-06-03 (×6): qty 1

## 2018-06-03 MED ORDER — TRAZODONE HCL 100 MG PO TABS
100.0000 mg | ORAL_TABLET | Freq: Every day | ORAL | Status: DC
  Administered 2018-06-03: 100 mg via ORAL
  Filled 2018-06-03: qty 1

## 2018-06-03 NOTE — ED Notes (Signed)
Pt pacing hallways, appears to be anxious. This nurse notified Laurie,NP of pt behavior. Awaiting orders

## 2018-06-03 NOTE — ED Notes (Signed)
Pt taking a shower 

## 2018-06-03 NOTE — ED Notes (Signed)
Patient was given crayons on day shift, patient has been writing on walls and going in other rooms writing on walls. Crayons was removed from patient. Writer had asked why was he doing that he explained tha the voices were telling him to do that

## 2018-06-03 NOTE — ED Notes (Signed)
Pt A&O x 3, no distress noted, calm & cooperative, interactive with staff.  Monitoring for safety, Q 15 min checks in effect. 

## 2018-06-03 NOTE — Consult Note (Signed)
[facility] Face-to-Face Psychiatry Consult   Reason for Consult:  Suicidal ideation Referring Physician:  EDP Patient Identification: Garrick Midgley MRN:  [id number] Principal Diagnosis: Schizoaffective disorder, bipolar type (HCC) Diagnosis:  Principal Problem:   Schizoaffective disorder, bipolar type (HCC)   Total Time spent with patient: 30 minutes  Subjective:   Delynn Pursley is a 38 y.o. male patient admitted with suicidal ideation.  HPI:  Pt was seen and chart reviewed with treatment team and Dr Lucianne Muss.  Pt denies suicidal/homicidal ideation, denies auditory/visual hallucinations and does not appear to be responding to internal stimuli. Pt stated he feels better today. Pt stated he did live in [facility] but he can't go back there because they were fussing at him. Pt also stated he has been living with his father but he stated they have been arguing. Pt's father, Tawni Carnes,  is his legal guardian. Pt has been restarted on his home medications and will be given his Invega Sustena 234 mg LAI today, 06-03-2018. Pt will be observed for 24 hours for medication efficacy and safety.  TTS will attempt to contact pt's guardian to update him  on Pt;'s status and inform him that Pt will be ready for discharge on 06-04-2018. Pt is calm and cooperative, alert and oriented on the acute unit. Pt is causing no discord in the milieu.   Past Psychiatric History: As above  Risk to Self: Suicidal Ideation: Yes-Currently Present Suicidal Intent: Yes-Currently Present Is patient at risk for suicide?: Yes Suicidal Plan?: Yes-Currently Present Specify Current Suicidal Plan: Pt states he will "shred himself" Access to Means: (UTA ) What has been your use of drugs/alcohol within the last 12 months?: Past use of Cannabis How many times?: (UTA) Other Self Harm Risks: Off medications Triggers for Past Attempts: (UTA) Intentional Self Injurious Behavior: None Risk to Others: Homicidal Ideation: No Thoughts of  Harm to Others: No Current Homicidal Intent: No Current Homicidal Plan: No Access to Homicidal Means: No Identified Victim: NA History of harm to others?: No Assessment of Violence: None Noted Violent Behavior Description: NA Does patient have access to weapons?: No Criminal Charges Pending?: No Does patient have a court date: No Prior Inpatient Therapy: Prior Inpatient Therapy: Yes Prior Therapy Dates: 2019,2018 Prior Therapy Facilty/Provider(s): [facility], [facility](Per pt) Reason for Treatment: MH issues Prior Outpatient Therapy: Prior Outpatient Therapy: [facility])  Past Medical History:  Past Medical History:  Diagnosis Date  . Intellectual disability    mild  . Marijuana use   . Personality disorder (HCC)   . PTSD (post-traumatic stress disorder)   . 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 Schizoaffective disorder, bipolar type [facility])     Past Surgical History:  Procedure Laterality Date  . ESOPHAGOGASTRODUODENOSCOPY (EGD) WITH PROPOFOL N/A 04/30/2018   Procedure: ESOPHAGOGASTRODUODENOSCOPY (EGD) WITH PROPOFOL;  Surgeon: Kathi Der, MD;  Location: [facility] ENDOSCOPY;  Service: Gastroenterology;  Laterality: N/A;  . FOREIGN BODY REMOVAL N/A 04/30/2018   Procedure: FOREIGN BODY REMOVAL;  Surgeon: Kathi Der, MD;  Location: [facility] ENDOSCOPY;  Service: Gastroenterology;  Laterality: N/A;  . FRACTURE SURGERY      r ankle    Family History: History reviewed. No pertinent family history. Family Psychiatric  History: Pt did not give this information.  Social History:  Social History   Substance and Sexual Activity  Alcohol Use Not Currently     Social History   Substance and Sexual Activity  Drug Use Yes  . Frequency: 3.0 times per week  . Types: Marijuana  Social History   Socioeconomic History  . Marital status: Single    Spouse name: Not on file  . Number of children: Not on file  . Years of education: Not on file  . Highest education level: Not on file   Occupational History  . Not on file  Social Needs  . Financial resource strain: Not on file  . Food insecurity:    Worry: Not on file    Inability: Not on file  . Transportation needs:    Medical: Not on file    Non-medical: Not on file  Tobacco Use  . Smoking status: Current Every Day Smoker    Types: Cigars, Cigarettes  . Smokeless tobacco: Never Used  Substance and Sexual Activity  . Alcohol use: Not Currently  . Drug use: Yes    Frequency: 3.0 times per week    Types: Marijuana  . Sexual activity: Not Currently  Lifestyle  . Physical activity:    Days per week: Not on file    Minutes per session: Not on file  . Stress: Not on file  Relationships  . Social connections:    Talks on phone: Not on file    Gets together: Not on file    Attends religious service: Not on file    Active member of club or organization: Not on file    Attends meetings of clubs or organizations: Not on file    Relationship status: Not on file  Other Topics Concern  . Not on file  Social History Narrative  . Not on file   Additional Social History:    Allergies:   Allergies  Allergen Reactions  . Haldol [Haloperidol Lactate]   . Shellfish Allergy Itching and Rash    Labs:  Results for orders placed or performed during the hospital encounter of 06/02/18 (from the past 48 hour(s))  CBC with Differential/Platelet     Status: Abnormal   Collection Time: 06/02/18  1:55 PM  Result Value Ref Range   WBC 6.1 4.0 - 10.5 K/uL   RBC 4.69 4.22 - 5.81 MIL/uL   Hemoglobin 12.5 (L) 13.0 - 17.0 g/dL   HCT 81.141.2 91.439.0 - 78.252.0 %   MCV 87.8 80.0 - 100.0 fL   MCH 26.7 26.0 - 34.0 pg   MCHC 30.3 30.0 - 36.0 g/dL   RDW 95.613.2 21.311.5 - 08.615.5 %   Platelets 239 150 - 400 K/uL   nRBC 0.0 0.0 - 0.2 %   Neutrophils Relative % 68 %   Neutro Abs 4.1 1.7 - 7.7 K/uL   Lymphocytes Relative 21 %   Lymphs Abs 1.3 0.7 - 4.0 K/uL   Monocytes Relative 9 %   Monocytes Absolute 0.6 0.1 - 1.0 K/uL   Eosinophils  Relative 1 %   Eosinophils Absolute 0.1 0.0 - 0.5 K/uL   Basophils Relative 1 %   Basophils Absolute 0.0 0.0 - 0.1 K/uL   Immature Granulocytes 0 %   Abs Immature Granulocytes 0.01 0.00 - 0.07 K/uL    Comment: Performed at [facility], [address] [place], [place] [address]  Comprehensive metabolic panel     Status: Abnormal   Collection Time: 06/02/18  1:55 PM  Result Value Ref Range   Sodium 140 135 - 145 mmol/L   Potassium 3.8 3.5 - 5.1 mmol/L   Chloride 103 98 - 111 mmol/L   CO2 26 22 - 32 mmol/L   Glucose, Bld 119 (H) 70 - 99 mg/dL  BUN 15 6 - 20 mg/dL   Creatinine, Ser 1.61 0.61 - 1.24 mg/dL   Calcium 9.2 8.9 - 09.6 mg/dL   Total Protein 6.6 6.5 - 8.1 g/dL   Albumin 3.9 3.5 - 5.0 g/dL   AST 23 15 - 41 U/L   ALT 14 0 - 44 U/L   Alkaline Phosphatase 79 38 - 126 U/L   Total Bilirubin 0.2 (L) 0.3 - 1.2 mg/dL   GFR calc non Af Amer >60 >60 mL/min   GFR calc Af Amer >60 >60 mL/min   Anion gap 11 5 - 15    Comment: Performed at [facility], [address], [place], [place] [address]  Acetaminophen level     Status: Abnormal   Collection Time: 06/02/18  3:28 PM  Result Value Ref Range   Acetaminophen (Tylenol), Serum <10 (L) 10 - 30 ug/mL    Comment: (NOTE) Therapeutic concentrations vary significantly. A range of 10-30 ug/mL  may be an effective concentration for many patients. However, some  are best treated at concentrations outside of this range. Acetaminophen concentrations >150 ug/mL at 4 hours after ingestion  and >50 ug/mL at 12 hours after ingestion are often associated with  toxic reactions. Performed at [facility], [address] [place], [place] [address]   Salicylate level     Status: None   Collection Time: 06/02/18  3:28 PM  Result Value Ref Range   Salicylate Lvl <7.0 2.8 - 30.0 mg/dL    Comment: Performed at [facility], [address], [place], [place] [address]   Ethanol     Status: None   Collection Time: 06/02/18  3:28 PM  Result Value Ref Range   Alcohol, Ethyl (B) <10 <10 mg/dL    Comment: (NOTE) Lowest detectable limit for serum alcohol is 10 mg/dL. For medical purposes only. Performed at [facility], [address], [place], [place] [address]   Rapid urine drug screen (hospital performed)     Status: None   Collection Time: 06/02/18  9:11 PM  Result Value Ref Range   Opiates NONE DETECTED NONE DETECTED   Cocaine NONE DETECTED NONE DETECTED   Benzodiazepines NONE DETECTED NONE DETECTED   Amphetamines NONE DETECTED NONE DETECTED   Tetrahydrocannabinol NONE DETECTED NONE DETECTED   Barbiturates NONE DETECTED NONE DETECTED    Comment: (NOTE) DRUG SCREEN FOR MEDICAL PURPOSES ONLY.  IF CONFIRMATION IS NEEDED FOR ANY PURPOSE, NOTIFY LAB WITHIN 5 DAYS. LOWEST DETECTABLE LIMITS FOR URINE DRUG SCREEN Drug Class                     Cutoff (ng/mL) Amphetamine and metabolites    1000 Barbiturate and metabolites    200 Benzodiazepine                 200 Tricyclics and metabolites     300 Opiates and metabolites        300 Cocaine and metabolites        300 THC                            50 Performed at [facility], [address], [place], [place] [address]     Current Facility-Administered Medications  Medication Dose Route Frequency Provider Last Rate Last Dose  . albuterol (PROVENTIL HFA;VENTOLIN HFA) 108 (90 Base) MCG/ACT inhaler 2 puff  2 puff Inhalation Q6H PRN Ala Dach,  Arva Chafe, PA-C      . alum & mag hydroxide-simeth (MAALOX/MYLANTA) 200-200-20 MG/5ML suspension 15 mL  15 mL Oral Q6H PRN Laveda Abbe, NP   15 mL at 06/02/18 1509  . benztropine (COGENTIN) tablet 0.5 mg  0.5 mg Oral Daily Laveda Abbe, NP   0.5 mg at 06/03/18 0912  . cholecalciferol (VITAMIN D3) tablet 2,000 Units  2,000 Units Oral Daily Laveda Abbe, NP   2,000 Units at 06/03/18 0912  . cloZAPine  (CLOZARIL) tablet 250 mg  250 mg Oral QHS Laveda Abbe, NP   250 mg at 06/02/18 2155  . docusate sodium (COLACE) capsule 200 mg  200 mg Oral BID Jodi Geralds N, PA-C   200 mg at 06/03/18 7902  . lithium carbonate (LITHOBID) CR tablet 300 mg  300 mg Oral Q breakfast Laveda Abbe, NP   300 mg at 06/03/18 4097   And  . lithium carbonate (LITHOBID) CR tablet 600 mg  600 mg Oral QHS Laveda Abbe, NP   600 mg at 06/02/18 2156  . pantoprazole (PROTONIX) EC tablet 40 mg  40 mg Oral Daily Dartha Lodge, [place]   40 mg at 06/03/18 3532  . polyethylene glycol (MIRALAX / GLYCOLAX) packet 17 g  17 g Oral Daily Ford, Kelsey N, [place]      . sertraline (ZOLOFT) tablet 100 mg  100 mg Oral Daily Laveda Abbe, NP   100 mg at 06/03/18 9924   Current Outpatient Medications  Medication Sig Dispense Refill  . atomoxetine (STRATTERA) 25 MG capsule Take 25 mg by mouth daily.    . benztropine (COGENTIN) 0.5 MG tablet Take 0.5 mg by mouth daily.    . cetirizine (ZYRTEC) 10 MG tablet Take 10 mg by mouth daily.    . Cholecalciferol (VITAMIN D3) 50 MCG (2000 UT) capsule Take 2,000 Units by mouth daily.    . cloZAPine (CLOZARIL) 100 MG tablet Take 2.5 tablets (250 mg total) by mouth at bedtime. 90 tablet 0  . Docusate Sodium 100 MG capsule Take 200 mg by mouth 2 (two) times daily.    Marland Kitchen lithium carbonate 150 MG capsule Take 3 capsules (450 mg total) by mouth 2 (two) times daily with a meal. Take 1 tablet (300 mg totally) by mouth in the morning; take 2 tablets (600 mg totally) by mouth at bed time 90 capsule 0  . paliperidone (INVEGA SUSTENNA) 234 MG/1.5ML SUSY injection Inject 234 mg into the muscle every 30 (thirty) days.    . pantoprazole (PROTONIX) 40 MG tablet Take 1 tablet (40 mg total) by mouth daily. 30 tablet 0  . polyethylene glycol (MIRALAX / GLYCOLAX) packet Take 17 g by mouth daily.    . sertraline (ZOLOFT) 100 MG tablet Take 100 mg by mouth daily.    Marland Kitchen albuterol (PROVENTIL  HFA;VENTOLIN HFA) 108 (90 Base) MCG/ACT inhaler Inhale 2 puffs into the lungs every 6 (six) hours as needed for wheezing or shortness of breath.    . hydrOXYzine (ATARAX/VISTARIL) 25 MG tablet Take 25 mg by mouth 3 (three) times daily as needed for anxiety.      Musculoskeletal: Strength & Muscle Tone: within normal limits Gait & Station: normal Patient leans: N/A  Psychiatric Specialty Exam: Physical Exam  ROS  Blood pressure 130/83, pulse 69, temperature (!) 97.5 F (36.4 C), temperature source Oral, resp. rate 18, height 5\' 10"  (1.778 m), weight 85.3 kg, SpO2 100 %.Body mass index is 26.98 kg/m.  General  Appearance: Casual  Eye Contact:  Good  Speech:  Clear and Coherent  Volume:  Normal  Mood:  Euthymic  Affect:  Congruent  Thought Process:  Coherent, Linear and Descriptions of Associations: Intact  Orientation:  Full (Time, Place, and Person)  Thought Content:  Logical  Suicidal Thoughts:  No  Homicidal Thoughts:  No  Memory:  Immediate;   Good Recent;   Fair Remote;   Fair  Judgement:  Fair  Insight:  Fair  Psychomotor Activity:  Normal  Concentration:  Concentration: Good and Attention Span: Good  Recall:  Good  Fund of Knowledge:  Fair  Language:  Good  Akathisia:  No  Handed:  Right  AIMS (if indicated):     Assets:  Architect Housing Social Support  ADL's:  Intact  Cognition:  WNL  Sleep:        Treatment Plan Summary: Daily contact with patient to assess and evaluate symptoms and progress in treatment, Medication management and Plan Observe for 24 hours after restarting home medications for safety.   Disposition: No evidence of imminent risk to self or others at present.   Patient does not meet criteria for psychiatric inpatient admission. Pt restarted on home medication and received Invega Sustena 234 mg IM LAI today. observe for 24 hours for medication efficacy and safety.   Laveda Abbe,  NP 06/03/2018 12:55 PM

## 2018-06-04 DIAGNOSIS — R1010 Upper abdominal pain, unspecified: Secondary | ICD-10-CM | POA: Diagnosis not present

## 2018-06-04 DIAGNOSIS — R45851 Suicidal ideations: Secondary | ICD-10-CM | POA: Diagnosis not present

## 2018-06-04 DIAGNOSIS — F250 Schizoaffective disorder, bipolar type: Secondary | ICD-10-CM | POA: Diagnosis not present

## 2018-06-04 DIAGNOSIS — R443 Hallucinations, unspecified: Secondary | ICD-10-CM | POA: Diagnosis not present

## 2018-06-04 MED ORDER — LORAZEPAM 1 MG PO TABS
2.0000 mg | ORAL_TABLET | Freq: Once | ORAL | Status: AC
  Administered 2018-06-04: 2 mg via ORAL
  Filled 2018-06-04: qty 2

## 2018-06-04 MED ORDER — TRAZODONE HCL 100 MG PO TABS
200.0000 mg | ORAL_TABLET | Freq: Every day | ORAL | Status: DC
  Administered 2018-06-04: 200 mg via ORAL
  Filled 2018-06-04: qty 2

## 2018-06-04 NOTE — ED Notes (Signed)
Pt alert and oriented. Pt denies any pain or discomfort at this time. Pt denies any si and hi. Pt c/o of hearing voices. Pt states " the voices keep telling me to do things". Pt contract to safety. Pt safe , will continue to monitor.

## 2018-06-04 NOTE — Consult Note (Signed)
[facility]   Reason for Consult:  Suicidal ideation Referring Physician:  EDP Patient Identification: Lawrence Morgan MRN:  [id number] Principal Diagnosis: Schizoaffective disorder, bipolar type (HCC) Diagnosis:  Principal Problem:   Schizoaffective disorder, bipolar type (HCC)   Total Time spent with patient: 30 minutes  Subjective:   Lawrence Morgan is a 38 y.o. male patient admitted with suicidal ideation.  HPI:  Pt was seen and chart reviewed with treatment team and Dr Lucianne MussKumar.  Pt denies suicidal/homicidal ideation, denies auditory/visual hallucinations and does not appear to be responding to internal stimuli. Pt stated he feels better today. Pt stated [facility] but he can't go back there because they were fussing at him. Pt also stated he has been living with his father but he stated they have been arguing. Pt's father, Tawni CarnesRicky Gaymon,  is his legal guardian. Pt has been restarted on his home medications and will be given his Invega Sustena 234 mg LAI today, 06-03-2018. Pt will be observed for 24 hours for medication efficacy and safety.  TTS contacted Pt's guardian to request he come to pick up Pt but he refused stating the pt should return to the group home as his bed is still available. Group home was contacted and the group home manager is out of town until late this evening and will not be available to pick up Pt until 06-05-2018 in the morning. Pt is calm and cooperative, alert and oriented on the acute unit. Pt is not sleeping very well on the unit and tends to pace around the hallway. His trazodone has been increased to 200 mg at bedtime. He was also given a one time dose of Ativan 2 mg and he is not pacing but has still not laid down to go to sleep. Will continue to monitor. Pt is causing no discord in the milieu.    Past Psychiatric History: As above  Risk to Self: Suicidal Ideation: Yes-Currently Present Suicidal Intent: Yes-Currently  Present Is patient at risk for suicide?: Yes Suicidal Plan?: Yes-Currently Present Specify Current Suicidal Plan: Pt states he will "shred himself" Access to Means: (UTA ) What has been your use of drugs/alcohol within the last 12 months?: Past use of Cannabis How many times?: (UTA) Other Self Harm Risks: Off medications Triggers for Past Attempts: (UTA) Intentional Self Injurious Behavior: None Risk to Others: Homicidal Ideation: No Thoughts of Harm to Others: No Current Homicidal Intent: No Current Homicidal Plan: No Access to Homicidal Means: No Identified Victim: NA History of harm to others?: No Assessment of Violence: None Noted Violent Behavior Description: NA Does patient have access to weapons?: No Criminal Charges Pending?: No Does patient have a court date: No Prior Inpatient Therapy: Prior Inpatient Therapy: Yes Prior Therapy Dates: 2019,2018 Prior Therapy Facilty/Provider(s): [facility], [facility](Per pt) Reason for Treatment: MH issues Prior Outpatient Therapy: Prior Outpatient Therapy: [facility] Reining(UTA)  Past Medical History:  Past Medical History:  Diagnosis Date  . Intellectual disability    mild  . Marijuana use   . Personality disorder (HCC)   . PTSD (post-traumatic stress disorder)   . 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 Schizoaffective disorder, bipolar type [facility](HCC)     Past Surgical History:  Procedure Laterality Date  . ESOPHAGOGASTRODUODENOSCOPY (EGD) WITH PROPOFOL N/A 04/30/2018   Procedure: ESOPHAGOGASTRODUODENOSCOPY (EGD) WITH PROPOFOL;  Surgeon: Kathi DerBrahmbhatt, Parag, MD;  Location: [facility] ENDOSCOPY;  Service: Gastroenterology;  Laterality: N/A;  . FOREIGN BODY REMOVAL N/A 04/30/2018   Procedure: FOREIGN BODY REMOVAL;  Surgeon: Kathi Der, MD;  Location: [facility] ENDOSCOPY;  Service: Gastroenterology;  Laterality: N/A;  . FRACTURE SURGERY      r ankle    Family History: History reviewed. No pertinent family history. Family Psychiatric  History: Pt did not give  this information.  Social History:  Social History   Substance and Sexual Activity  Alcohol Use Not Currently     Social History   Substance and Sexual Activity  Drug Use Yes  . Frequency: 3.0 times per week  . Types: Marijuana    Social History   Socioeconomic History  . Marital status: Single    Spouse name: Not on file  . Number of children: Not on file  . Years of education: Not on file  . Highest education level: Not on file  Occupational History  . Not on file  Social Needs  . Financial resource strain: Not on file  . Food insecurity:    Worry: Not on file    Inability: Not on file  . Transportation needs:    Medical: Not on file    Non-medical: Not on file  Tobacco Use  . Smoking status: Current Every Day Smoker    Types: Cigars, Cigarettes  . Smokeless tobacco: Never Used  Substance and Sexual Activity  . Alcohol use: Not Currently  . Drug use: Yes    Frequency: 3.0 times per week    Types: Marijuana  . Sexual activity: Not Currently  Lifestyle  . Physical activity:    Days per week: Not on file    Minutes per session: Not on file  . Stress: Not on file  Relationships  . Social connections:    Talks on phone: Not on file    Gets together: Not on file    Attends religious service: Not on file    Active member of club or organization: Not on file    Attends meetings of clubs or organizations: Not on file    Relationship status: Not on file  Other Topics Concern  . Not on file  Social History Narrative  . Not on file   Additional Social History:    Allergies:   Allergies  Allergen Reactions  . Haldol [Haloperidol Lactate]   . Shellfish Allergy Itching and Rash    Labs:  Results for orders placed or performed during the hospital encounter of 06/02/18 (from the past 48 hour(s))  CBC with Differential/Platelet     Status: Abnormal   Collection Time: 06/02/18  1:55 PM  Result Value Ref Range   WBC 6.1 4.0 - 10.5 K/uL   RBC 4.69 4.22 - 5.81  MIL/uL   Hemoglobin 12.5 (L) 13.0 - 17.0 g/dL   HCT 10.1 75.1 - 02.5 %   MCV 87.8 80.0 - 100.0 fL   MCH 26.7 26.0 - 34.0 pg   MCHC 30.3 30.0 - 36.0 g/dL   RDW 85.2 77.8 - 24.2 %   Platelets 239 150 - 400 K/uL   nRBC 0.0 0.0 - 0.2 %   Neutrophils Relative % 68 %   Neutro Abs 4.1 1.7 - 7.7 K/uL   Lymphocytes Relative 21 %   Lymphs Abs 1.3 0.7 - 4.0 K/uL   Monocytes Relative 9 %   Monocytes Absolute 0.6 0.1 - 1.0 K/uL   Eosinophils Relative 1 %   Eosinophils Absolute 0.1 0.0 - 0.5 K/uL   Basophils Relative 1 %   Basophils Absolute 0.0 0.0 - 0.1 K/uL   Immature Granulocytes 0 %  Abs Immature Granulocytes 0.01 0.00 - 0.07 K/uL    Comment: Performed at [facility], [address] [place], [place] [address]  Comprehensive metabolic panel     Status: Abnormal   Collection Time: 06/02/18  1:55 PM  Result Value Ref Range   Sodium 140 135 - 145 mmol/L   Potassium 3.8 3.5 - 5.1 mmol/L   Chloride 103 98 - 111 mmol/L   CO2 26 22 - 32 mmol/L   Glucose, Bld 119 (H) 70 - 99 mg/dL   BUN 15 6 - 20 mg/dL   Creatinine, Ser 0.71 0.61 - 1.24 mg/dL   Calcium 9.2 8.9 - 21.9 mg/dL   Total Protein 6.6 6.5 - 8.1 g/dL   Albumin 3.9 3.5 - 5.0 g/dL   AST 23 15 - 41 U/L   ALT 14 0 - 44 U/L   Alkaline Phosphatase 79 38 - 126 U/L   Total Bilirubin 0.2 (L) 0.3 - 1.2 mg/dL   GFR calc non Af Amer >60 >60 mL/min   GFR calc Af Amer >60 >60 mL/min   Anion gap 11 5 - 15    Comment: Performed at [facility], [address], [place], [place] [address]  Acetaminophen level     Status: Abnormal   Collection Time: 06/02/18  3:28 PM  Result Value Ref Range   Acetaminophen (Tylenol), Serum <10 (L) 10 - 30 ug/mL    Comment: (NOTE) Therapeutic concentrations vary significantly. A range of 10-30 ug/mL  may be an effective concentration for many patients. However, some  are best treated at concentrations outside of this range. Acetaminophen concentrations >150 ug/mL at 4  hours after ingestion  and >50 ug/mL at 12 hours after ingestion are often associated with  toxic reactions. Performed at [facility], [address], [place], [place] [address]   Salicylate level     Status: None   Collection Time: 06/02/18  3:28 PM  Result Value Ref Range   Salicylate Lvl <7.0 2.8 - 30.0 mg/dL    Comment: Performed at [facility], [address] [place], [place] [address]  Ethanol     Status: None   Collection Time: 06/02/18  3:28 PM  Result Value Ref Range   Alcohol, Ethyl (B) <10 <10 mg/dL    Comment: (NOTE) Lowest detectable limit for serum alcohol is 10 mg/dL. For medical purposes only. Performed at [facility], [address] [place], [place] [address]   Rapid urine drug screen (hospital performed)     Status: None   Collection Time: 06/02/18  9:11 PM  Result Value Ref Range   Opiates NONE DETECTED NONE DETECTED   Cocaine NONE DETECTED NONE DETECTED   Benzodiazepines NONE DETECTED NONE DETECTED   Amphetamines NONE DETECTED NONE DETECTED   Tetrahydrocannabinol NONE DETECTED NONE DETECTED   Barbiturates NONE DETECTED NONE DETECTED    Comment: (NOTE) DRUG SCREEN FOR MEDICAL PURPOSES ONLY.  IF CONFIRMATION IS NEEDED FOR ANY PURPOSE, NOTIFY LAB WITHIN 5 DAYS. LOWEST DETECTABLE LIMITS FOR URINE DRUG SCREEN Drug Class                     Cutoff (ng/mL) Amphetamine and metabolites    1000 Barbiturate and metabolites    200 Benzodiazepine                 200 Tricyclics and metabolites     300 Opiates and metabolites  300 Cocaine and metabolites        300 THC                            50 Performed at [facility], [address] [place], [place] [address]     Current Facility-Administered Medications  Medication Dose Route Frequency Provider Last Rate Last Dose  . albuterol (PROVENTIL HFA;VENTOLIN HFA) 108 (90 Base) MCG/ACT inhaler 2 puff  2 puff Inhalation Q6H  PRN Dartha LodgeFord, Kelsey N, PA-C      . alum & mag hydroxide-simeth (MAALOX/MYLANTA) 200-200-20 MG/5ML suspension 15 mL  15 mL Oral Q6H PRN Laveda AbbeParks,  Britton, NP   15 mL at 06/02/18 1509  . benztropine (COGENTIN) tablet 0.5 mg  0.5 mg Oral Daily Laveda AbbeParks,  Britton, NP   0.5 mg at 06/04/18 0935  . cholecalciferol (VITAMIN D3) tablet 2,000 Units  2,000 Units Oral Daily Laveda AbbeParks,  Britton, NP   2,000 Units at 06/04/18 0935  . cloZAPine (CLOZARIL) tablet 250 mg  250 mg Oral QHS Laveda AbbeParks,  Britton, NP   250 mg at 06/03/18 2250  . docusate sodium (COLACE) capsule 200 mg  200 mg Oral BID Dartha LodgeFord, Kelsey N, PA-C   200 mg at 06/04/18 [id number]  . hydrOXYzine (ATARAX/VISTARIL) tablet 25 mg  25 mg Oral TID Laveda AbbeParks,  Britton, NP   25 mg at 06/04/18 0935  . lithium carbonate (LITHOBID) CR tablet 300 mg  300 mg Oral Q breakfast Laveda AbbeParks,  Britton, NP   300 mg at 06/04/18 0935   And  . lithium carbonate (LITHOBID) CR tablet 600 mg  600 mg Oral QHS Laveda AbbeParks,  Britton, NP   600 mg at 06/03/18 2249  . pantoprazole (PROTONIX) EC tablet 40 mg  40 mg Oral Daily Dartha LodgeFord, Kelsey N, [place]   40 mg at 06/04/18 0935  . polyethylene glycol (MIRALAX / GLYCOLAX) packet 17 g  17 g Oral Daily Ford, Kelsey N, [place]-C      . sertraline (ZOLOFT) tablet 100 mg  100 mg Oral Daily Laveda AbbeParks,  Britton, NP   100 mg at 06/04/18 0934  . traZODone (DESYREL) tablet 200 mg  200 mg Oral QHS Laveda AbbeParks,  Britton, NP       Current Outpatient Medications  Medication Sig Dispense Refill  . atomoxetine (STRATTERA) 25 MG capsule Take 25 mg by mouth daily.    . benztropine (COGENTIN) 0.5 MG tablet Take 0.5 mg by mouth daily.    . cetirizine (ZYRTEC) 10 MG tablet Take 10 mg by mouth daily.    . Cholecalciferol (VITAMIN D3) 50 MCG (2000 UT) capsule Take 2,000 Units by mouth daily.    . cloZAPine (CLOZARIL) 100 MG tablet Take 2.5 tablets (250 mg total) by mouth at bedtime. 90 tablet 0  . Docusate Sodium 100 MG capsule Take 200 mg by mouth 2 (two)  times daily.    Marland Kitchen. lithium carbonate 150 MG capsule Take 3 capsules (450 mg total) by mouth 2 (two) times daily with a meal. Take 1 tablet (300 mg totally) by mouth in the morning; take 2 tablets (600 mg totally) by mouth at bed time 90 capsule 0  . paliperidone (INVEGA SUSTENNA) 234 MG/1.5ML SUSY injection Inject 234 mg into the muscle every 30 (thirty) days.    . pantoprazole (PROTONIX) 40 MG tablet Take 1 tablet (40 mg total) by mouth daily. 30 tablet 0  . polyethylene glycol (MIRALAX / GLYCOLAX) packet Take 17 g by  mouth daily.    . sertraline (ZOLOFT) 100 MG tablet Take 100 mg by mouth daily.    Marland Kitchen albuterol (PROVENTIL HFA;VENTOLIN HFA) 108 (90 Base) MCG/ACT inhaler Inhale 2 puffs into the lungs every 6 (six) hours as needed for wheezing or shortness of breath.    . hydrOXYzine (ATARAX/VISTARIL) 25 MG tablet Take 25 mg by mouth 3 (three) times daily as needed for anxiety.      Musculoskeletal: Strength & Muscle Tone: within normal limits Gait & Station: normal Patient leans: N/A  Psychiatric Specialty Exam: Physical Exam  ROS  Blood pressure (!) 145/86, pulse 63, temperature 98.5 F (36.9 C), temperature source Oral, resp. rate 18, height 5\' 10"  (1.778 m), weight 85.3 kg, SpO2 99 %.Body mass index is 26.98 kg/m.  General Appearance: Casual  Eye Contact:  Good  Speech:  Clear and Coherent  Volume:  Normal  Mood:  Euthymic, paces the hallway  Affect:  Congruent  Thought Process:  Coherent, Linear and Descriptions of Associations: Intact  Orientation:  Full (Time, Place, and Person)  Thought Content:  Logical, but does ask frequently if he is going to [facility]  Suicidal Thoughts:  No  Homicidal Thoughts:  No  Memory:  Immediate;   Good Recent;   Fair Remote;   Fair  Judgement:  Fair  Insight:  Fair  Psychomotor Activity:  Normal  Concentration:  Concentration: Good and Attention Span: Good  Recall:  Good  Fund of Knowledge:  Fair  Language:  Good  Akathisia:  No  Handed:  Right   AIMS (if indicated):     Assets:  Architect Housing Social Support  ADL's:  Intact  Cognition:  WNL  Sleep:        Treatment Plan Summary: Daily contact with patient to assess and evaluate symptoms and progress in treatment, Medication management and Plan Observe for 24 hours after restarting home medications for safety.   Disposition: No evidence of imminent risk to self or others at present.   Patient does not meet criteria for psychiatric inpatient admission. Pt restarted on home medication and received Invega Sustena 234 mg IM LAI today. observe for 24 hours for medication efficacy and safety. Pt's group home will pick Pt up in the morning on Monday, 06-05-2018.  Laveda Abbe, NP 06/04/2018 1:13 PM

## 2018-06-04 NOTE — ED Notes (Signed)
Pt complaint with medication regimen. Pt anxious, endorsing AH. Encouragement and support provided. special checks q 15 mins in place for safety, video monitoring in place. Will continue to monitor.

## 2018-06-04 NOTE — ED Notes (Signed)
Pt taking a shower 

## 2018-06-04 NOTE — Progress Notes (Signed)
CSW spoke to pt father, Lawrence Morgan, [phone].  Father reports pt stays at [facility] group home and provided two numbers: [phone] or [phone].  Contacts are [facility] or [place]. Father working today and would not be able to get pt prior to the end of the day. CSW able to reach [place] on the first number. [facility] and the entire group home are out of town today, won't be back until 8pm, would prefer to pick pt up tomorrow morning.  CSW spoke with Dr Lucianne Muss who agrees that pt remain at [facility] overnight and be picked up in AM.  CSW unable to reach group home staff again to confirm this plan but did leave message.  CSW called father/legal guardian again and voicemail box was full.  CSW texted same number regarding plan and received a text "OK" in response from the father/legal guardian. Garner Nash, MSW, LCSW Clinical Social Worker 06/04/2018 1:11 PM

## 2018-06-05 DIAGNOSIS — F250 Schizoaffective disorder, bipolar type: Secondary | ICD-10-CM | POA: Diagnosis not present

## 2018-06-05 MED ORDER — LITHIUM CARBONATE ER 300 MG PO TBCR: 300.0000 mg | EXTENDED_RELEASE_TABLET | Freq: Every day | ORAL | 0 refills | Status: DC

## 2018-06-05 MED ORDER — LITHIUM CARBONATE ER 300 MG PO TBCR: 600.0000 mg | EXTENDED_RELEASE_TABLET | Freq: Every day | ORAL | 0 refills | Status: DC

## 2018-06-05 MED ORDER — HYDROXYZINE HCL 25 MG PO TABS: 25.0000 mg | ORAL_TABLET | Freq: Three times a day (TID) | ORAL | 0 refills | Status: DC

## 2018-06-05 MED ORDER — TRAZODONE HCL 100 MG PO TABS: 200.0000 mg | ORAL_TABLET | Freq: Every day | ORAL | 0 refills | Status: DC

## 2018-06-05 MED ORDER — LORAZEPAM 1 MG PO TABS
1.0000 mg | ORAL_TABLET | Freq: Once | ORAL | Status: AC
  Administered 2018-06-05: 1 mg via ORAL
  Filled 2018-06-05: qty 1

## 2018-06-05 NOTE — ED Notes (Signed)
Pt was irritable at discharge. He was picked up by Eber Jonesarolyn with the group home. This Clinical research associatewriter delivered pt to her in the ED lobby and went over pt prescriptions and discharge instructions with her. Pt was reluctant to leave and had made threats about acting out. He punched a hole in the wall of his room while here and wrote on the walls with crayon. Therefore, pt was not alerted about discharge until his pick-up arrived and he was too irritable and labile for VS to be taken. He did not sign for belongings for the same reason, but they were returned to him. Eber JonesCarolyn was able to escort pt to her car.

## 2018-06-05 NOTE — ED Notes (Signed)
Pt noted punching a hole in the wall. Pt states "  I don't know why I punched a hole in the wall". Pt refused to answer any other questions. Pt currently in room, security called. Dr. Lynelle Doctor made aware. Pt safe, will continue to monitor.

## 2018-06-05 NOTE — ED Provider Notes (Signed)
6:20 AM nurses called me that patient had got upset because he was going to be discharged back to his group home today.  They state he punched a hole in the wall.  However he is denying any pain in his hand.  She states his behavior has diffused and he is now not as upset.   Devoria Albe, MD 06/05/18 [phone]

## 2018-06-05 NOTE — Progress Notes (Addendum)
CSW received handoff from Weekend CSW. Per notes, patient is from [facility]. Per notes, patient's group home was out of town yesterday and reported they would not be back until 8pm. Per notes, patient's group home to pick up this morning. CSW spoke with Eber Jones, Librarian, academic of Group Home [phone], who states they would be by to pick up patient at 11am. CSW to update patient's RN.  CSW aware patient's legal guardian was notified of patient's disposition plan yesterday by Weekend CSW. CSW aware patient's legal guardian expressed understanding.  Archie Balboa, LCSWA  Clinical Social Work Department  [employer]  [phone]

## 2018-06-05 NOTE — Consult Note (Signed)
[facility] ED Discharge  06/05/2018 9:25 AM Lawrence Morgan  MRN:  [id number] Principal Problem: Schizoaffective disorder, bipolar type [facility]) Discharge Diagnoses: Principal Problem:   Schizoaffective disorder, bipolar type [facility])  Subjective: 38 yo male who presented to the ED with insomnia, agitation, and hearing voices.  Medications were adjusted and he slept, stabilized.  No hallucinations or suicidal/homicidal ideations or substance abuse.  Medications make his hallucinations better along with sleep, stress make them worse along with lack of sleep.  He does not want to return to his group home because he does not like it, his guardian wants him to return.  Security to escort him out as he already punched a hole in the wall because he did not want to leave.  STable for discharge.  Total Time spent with patient: 30 minutes  Past Psychiatric History: schizoaffective disorder, bipolar type  Past Medical History:  Past Medical History:  Diagnosis Date  . Intellectual disability    mild  . Marijuana use   . Personality disorder (HCC)   . PTSD (post-traumatic stress disorder)   . 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 Schizoaffective disorder, bipolar type [facility])     Past Surgical History:  Procedure Laterality Date  . ESOPHAGOGASTRODUODENOSCOPY (EGD) WITH PROPOFOL N/A 04/30/2018   Procedure: ESOPHAGOGASTRODUODENOSCOPY (EGD) WITH PROPOFOL;  Surgeon: Kathi Der, MD;  Location: [facility] END[facility];  Service: Gastroenterology;  Laterality: N/A;  . FOREIGN BODY REMOVAL N/A 04/30/2018   Procedure: FOREIGN BODY REMOVAL;  Surgeon: Kathi Der, MD;  Location: [facility] ENDOSCOPY;  Service: Gastroenterology;  Laterality: N/A;  . FRACTURE SURGERY      r ankle    Family History: History reviewed. No pertinent family history. Family Psychiatric  History: none Social History:  Social History   Substance and Sexual Activity  Alcohol Use Not Currently     Social History   Substance and  Sexual Activity  Drug Use Yes  . Frequency: 3.0 times per week  . Types: Marijuana    Social History   Socioeconomic History  . Marital status: Single    Spouse name: Not on file  . Number of children: Not on file  . Years of education: Not on file  . Highest education level: Not on file  Occupational History  . Not on file  Social Needs  . Financial resource strain: Not on file  . Food insecurity:    Worry: Not on file    Inability: Not on file  . Transportation needs:    Medical: Not on file    Non-medical: Not on file  Tobacco Use  . Smoking status: Current Every Day Smoker    Types: Cigars, Cigarettes  . Smokeless tobacco: Never Used  Substance and Sexual Activity  . Alcohol use: Not Currently  . Drug use: Yes    Frequency: 3.0 times per week    Types: Marijuana  . Sexual activity: Not Currently  Lifestyle  . Physical activity:    Days per week: Not on file    Minutes per session: Not on file  . Stress: Not on file  Relationships  . Social connections:    Talks on phone: Not on file    Gets together: Not on file    Attends religious service: Not on file    Active member of club or organization: Not on file    Attends meetings of clubs or organizations: Not on file    Relationship status: Not on file  Other Topics Concern  . Not on file  Social History Narrative  . Not on file    Has this patient used any form of tobacco in the last 30 days? (Cigarettes, Smokeless Tobacco, Cigars, and/or Pipes) None  Current Medications: Current Facility-Administered Medications  Medication Dose Route Frequency Provider Last Rate Last Dose  . albuterol (PROVENTIL HFA;VENTOLIN HFA) 108 (90 Base) MCG/ACT inhaler 2 puff  2 puff Inhalation Q6H PRN Dartha LodgeFord, Kelsey N, PA-C      . alum & mag hydroxide-simeth (MAALOX/MYLANTA) 200-200-20 MG/5ML suspension 15 mL  15 mL Oral Q6H PRN Laveda AbbeParks, Laurie Britton, NP   15 mL at 06/02/18 1509  . benztropine (COGENTIN) tablet 0.5 mg  0.5 mg Oral  Daily Laveda AbbeParks, Laurie Britton, NP   0.5 mg at 06/04/18 0935  . cholecalciferol (VITAMIN D3) tablet 2,000 Units  2,000 Units Oral Daily Laveda AbbeParks, Laurie Britton, NP   2,000 Units at 06/04/18 0935  . cloZAPine (CLOZARIL) tablet 250 mg  250 mg Oral QHS Laveda AbbeParks, Laurie Britton, NP   250 mg at 06/04/18 2116  . docusate sodium (COLACE) capsule 200 mg  200 mg Oral BID Dartha LodgeFord, Kelsey N, PA-C   200 mg at 06/04/18 2115  . hydrOXYzine (ATARAX/VISTARIL) tablet 25 mg  25 mg Oral TID Laveda AbbeParks, Laurie Britton, NP   25 mg at 06/04/18 2115  . lithium carbonate (LITHOBID) CR tablet 300 mg  300 mg Oral Q breakfast Laveda AbbeParks, Laurie Britton, NP   300 mg at 06/05/18 [id number]   And  . lithium carbonate (LITHOBID) CR tablet 600 mg  600 mg Oral QHS Laveda AbbeParks, Laurie Britton, NP   600 mg at 06/04/18 2116  . LORazepam (ATIVAN) tablet 1 mg  1 mg Oral Once Charm RingsLord, Jamison Y, NP      . pantoprazole (PROTONIX) EC tablet 40 mg  40 mg Oral Daily Dartha LodgeFord, Kelsey N, PA-C   40 mg at 06/04/18 0935  . polyethylene glycol (MIRALAX / GLYCOLAX) packet 17 g  17 g Oral Daily Ford, Kelsey N, [place]      . sertraline (ZOLOFT) tablet 100 mg  100 mg Oral Daily Laveda AbbeParks, Laurie Britton, NP   100 mg at 06/04/18 0934  . traZODone (DESYREL) tablet 200 mg  200 mg Oral QHS Laveda AbbeParks, Laurie Britton, NP   200 mg at 06/04/18 2115   Current Outpatient Medications  Medication Sig Dispense Refill  . atomoxetine (STRATTERA) 25 MG capsule Take 25 mg by mouth daily.    . benztropine (COGENTIN) 0.5 MG tablet Take 0.5 mg by mouth daily.    . cetirizine (ZYRTEC) 10 MG tablet Take 10 mg by mouth daily.    . Cholecalciferol (VITAMIN D3) 50 MCG (2000 UT) capsule Take 2,000 Units by mouth daily.    . cloZAPine (CLOZARIL) 100 MG tablet Take 2.5 tablets (250 mg total) by mouth at bedtime. 90 tablet 0  . Docusate Sodium 100 MG capsule Take 200 mg by mouth 2 (two) times daily.    Marland Kitchen. lithium carbonate 150 MG capsule Take 3 capsules (450 mg total) by mouth 2 (two) times daily with a meal. Take 1 tablet  (300 mg totally) by mouth in the morning; take 2 tablets (600 mg totally) by mouth at bed time 90 capsule 0  . paliperidone (INVEGA SUSTENNA) 234 MG/1.5ML SUSY injection Inject 234 mg into the muscle every 30 (thirty) days.    . pantoprazole (PROTONIX) 40 MG tablet Take 1 tablet (40 mg total) by mouth daily. 30 tablet 0  . polyethylene glycol (MIRALAX / GLYCOLAX) packet Take 17 g by mouth  daily.    . sertraline (ZOLOFT) 100 MG tablet Take 100 mg by mouth daily.    Marland Kitchen. albuterol (PROVENTIL HFA;VENTOLIN HFA) 108 (90 Base) MCG/ACT inhaler Inhale 2 puffs into the lungs every 6 (six) hours as needed for wheezing or shortness of breath.    . hydrOXYzine (ATARAX/VISTARIL) 25 MG tablet Take 25 mg by mouth 3 (three) times daily as needed for anxiety.     PTA Medications: (Not in a hospital admission)   Musculoskeletal: Strength & Muscle Tone: within normal limits Gait & Station: normal Patient leans: N/A  Psychiatric Specialty Exam: Physical Exam  Nursing note and vitals reviewed. Constitutional: He is oriented to person, place, and time. He appears well-developed and well-nourished.  HENT:  Head: Normocephalic.  Neck: Normal range of motion.  Respiratory: Effort normal.  Musculoskeletal: Normal range of motion.  Neurological: He is alert and oriented to person, place, and time.  Psychiatric: His speech is normal and behavior is normal. Thought content normal. His mood appears anxious. His affect is blunt. Cognition and memory are impaired. He expresses impulsivity.    Review of Systems  Psychiatric/Behavioral: The patient is nervous/anxious.   All other systems reviewed and are negative.   Blood pressure 115/75, pulse 68, temperature 98.3 F (36.8 C), temperature source Oral, resp. rate 18, height 5\' 10"  (1.778 m), weight 85.3 kg, SpO2 100 %.Body mass index is 26.98 kg/m.  General Appearance: Casual  Eye Contact:  Good  Speech:  Normal Rate  Volume:  Normal  Mood:  Anxious  Affect:   Blunt  Thought Process:  Coherent and Descriptions of Associations: Intact  Orientation:  Full (Time, Place, and Person)  Thought Content:  WDL and Logical  Suicidal Thoughts:  No  Homicidal Thoughts:  No  Memory:  Immediate;   Fair Recent;   Fair Remote;   Fair  Judgement:  Fair  Insight:  Fair  Psychomotor Activity:  Normal  Concentration:  Concentration: Fair and Attention Span: Fair  Recall:  FiservFair  Fund of Knowledge:  Fair  Language:  Fair  Akathisia:  No  Handed:  Right  AIMS (if indicated):     Assets:  Housing Leisure Time Physical Health Resilience Social Support  ADL's:  Intact  Cognition:  Impaired,  Mild  Sleep:        Demographic Factors:  Male  Loss Factors: NA  Historical Factors: Impulsivity  Risk Reduction Factors:   Sense of responsibility to family, Living with another person, especially a relative, Positive social support and Positive therapeutic relationship  Continued Clinical Symptoms:  Anxiety, mild  Cognitive Features That Contribute To Risk:  None    Suicide Risk:  Minimal: No identifiable suicidal ideation.  Patients presenting with no risk factors but with morbid ruminations; may be classified as minimal risk based on the severity of the depressive symptoms   Plan Of Care/Follow-up recommendations:  Schizoaffective disorder, bipolar type: -Continued Lithium 300 mg in the am and 600 mg in the pm -Continued Clozaril 250 mg at bedtime -Continued Zoloft 100 mg daily at depression -Invega 234 mg injection once yesterday  Anxiety: -Continue Vistaril 25 mg TID PRN  EPS: -Continued Cogentin 0.5 mg at bedtime  ADD: -Continued Straterra 25 mg daily  Activity:  as tolerated Diet:  heart healthy diet  Disposition: discharge home Nanine MeansLORD, JAMISON, NP 06/05/2018, 9:25 AM

## 2018-06-27 DIAGNOSIS — K5909 Other constipation: Secondary | ICD-10-CM | POA: Diagnosis not present

## 2018-06-27 DIAGNOSIS — J302 Other seasonal allergic rhinitis: Secondary | ICD-10-CM | POA: Diagnosis not present

## 2018-06-27 DIAGNOSIS — K30 Functional dyspepsia: Secondary | ICD-10-CM | POA: Diagnosis not present

## 2018-06-27 DIAGNOSIS — F200 Paranoid schizophrenia: Secondary | ICD-10-CM | POA: Diagnosis not present

## 2018-07-06 DIAGNOSIS — F250 Schizoaffective disorder, bipolar type: Secondary | ICD-10-CM | POA: Diagnosis not present

## 2018-07-06 DIAGNOSIS — Z79899 Other long term (current) drug therapy: Secondary | ICD-10-CM | POA: Diagnosis not present

## 2018-08-01 DIAGNOSIS — F250 Schizoaffective disorder, bipolar type: Secondary | ICD-10-CM | POA: Diagnosis not present

## 2018-08-01 DIAGNOSIS — Z79899 Other long term (current) drug therapy: Secondary | ICD-10-CM | POA: Diagnosis not present

## 2018-08-10 DIAGNOSIS — Z716 Tobacco abuse counseling: Secondary | ICD-10-CM | POA: Diagnosis not present

## 2018-08-10 DIAGNOSIS — F200 Paranoid schizophrenia: Secondary | ICD-10-CM | POA: Diagnosis not present

## 2018-08-10 DIAGNOSIS — F17210 Nicotine dependence, cigarettes, uncomplicated: Secondary | ICD-10-CM | POA: Diagnosis not present

## 2018-08-10 DIAGNOSIS — J302 Other seasonal allergic rhinitis: Secondary | ICD-10-CM | POA: Diagnosis not present

## 2018-08-10 DIAGNOSIS — K30 Functional dyspepsia: Secondary | ICD-10-CM | POA: Diagnosis not present

## 2018-09-05 DIAGNOSIS — Z79899 Other long term (current) drug therapy: Secondary | ICD-10-CM | POA: Diagnosis not present

## 2018-09-05 DIAGNOSIS — F250 Schizoaffective disorder, bipolar type: Secondary | ICD-10-CM | POA: Diagnosis not present

## 2018-10-02 ENCOUNTER — Emergency Department (HOSPITAL_COMMUNITY): Payer: Medicare Other

## 2018-10-02 ENCOUNTER — Other Ambulatory Visit: Payer: Self-pay

## 2018-10-02 ENCOUNTER — Emergency Department (HOSPITAL_COMMUNITY)
Admission: EM | Admit: 2018-10-02 | Discharge: 2018-10-06 | Disposition: A | Discharge status: Home / Self Care | Payer: Medicare Other | Attending: Emergency Medicine | Admitting: Emergency Medicine

## 2018-10-02 ENCOUNTER — Encounter (HOSPITAL_COMMUNITY): Payer: Self-pay | Admitting: Emergency Medicine

## 2018-10-02 DIAGNOSIS — T189XXA Foreign body of alimentary tract, part unspecified, initial encounter: Secondary | ICD-10-CM | POA: Insufficient documentation

## 2018-10-02 DIAGNOSIS — X58XXXA Exposure to other specified factors, initial encounter: Secondary | ICD-10-CM | POA: Diagnosis not present

## 2018-10-02 DIAGNOSIS — F1290 Cannabis use, unspecified, uncomplicated: Secondary | ICD-10-CM | POA: Diagnosis not present

## 2018-10-02 DIAGNOSIS — R45851 Suicidal ideations: Secondary | ICD-10-CM | POA: Diagnosis not present

## 2018-10-02 DIAGNOSIS — R442 Other hallucinations: Secondary | ICD-10-CM | POA: Diagnosis not present

## 2018-10-02 DIAGNOSIS — R4689 Other symptoms and signs involving appearance and behavior: Secondary | ICD-10-CM | POA: Diagnosis present

## 2018-10-02 DIAGNOSIS — F79 Unspecified intellectual disabilities: Secondary | ICD-10-CM | POA: Insufficient documentation

## 2018-10-02 DIAGNOSIS — Z79899 Other long term (current) drug therapy: Secondary | ICD-10-CM | POA: Insufficient documentation

## 2018-10-02 DIAGNOSIS — F250 Schizoaffective disorder, bipolar type: Secondary | ICD-10-CM | POA: Diagnosis not present

## 2018-10-02 DIAGNOSIS — Y939 Activity, unspecified: Secondary | ICD-10-CM | POA: Insufficient documentation

## 2018-10-02 DIAGNOSIS — R45850 Homicidal ideations: Secondary | ICD-10-CM | POA: Diagnosis not present

## 2018-10-02 DIAGNOSIS — Y999 Unspecified external cause status: Secondary | ICD-10-CM | POA: Insufficient documentation

## 2018-10-02 DIAGNOSIS — F17210 Nicotine dependence, cigarettes, uncomplicated: Secondary | ICD-10-CM | POA: Insufficient documentation

## 2018-10-02 DIAGNOSIS — F4312 Post-traumatic stress disorder, chronic: Secondary | ICD-10-CM | POA: Diagnosis not present

## 2018-10-02 DIAGNOSIS — Y929 Unspecified place or not applicable: Secondary | ICD-10-CM | POA: Diagnosis not present

## 2018-10-02 DIAGNOSIS — F29 Unspecified psychosis not due to a substance or known physiological condition: Secondary | ICD-10-CM | POA: Diagnosis not present

## 2018-10-02 LAB — COMPREHENSIVE METABOLIC PANEL
ALT: 10 U/L (ref 0–44)
AST: 23 U/L (ref 15–41)
Albumin: 4.5 g/dL (ref 3.5–5.0)
Alkaline Phosphatase: 90 U/L (ref 38–126)
Anion gap: 9 (ref 5–15)
BUN: 11 mg/dL (ref 6–20)
CO2: 27 mmol/L (ref 22–32)
Calcium: 10 mg/dL (ref 8.9–10.3)
Chloride: 105 mmol/L (ref 98–111)
Creatinine, Ser: 1.17 mg/dL (ref 0.61–1.24)
GFR calc Af Amer: >60 mL/min (ref >60)
GFR calc non Af Amer: >60 mL/min (ref >60)
Glucose, Bld: 102 mg/dL — ABNORMAL HIGH (ref 70–99)
Potassium: 4.5 mmol/L (ref 3.5–5.1)
Sodium: 141 mmol/L (ref 135–145)
Total Bilirubin: 0.6 mg/dL (ref 0.3–1.2)
Total Protein: 7 g/dL (ref 6.5–8.1)

## 2018-10-02 LAB — CBC
HCT: 41.2 % (ref 39.0–52.0)
Hemoglobin: 13 g/dL (ref 13.0–17.0)
MCH: 27.2 pg (ref 26.0–34.0)
MCHC: 31.6 g/dL (ref 30.0–36.0)
MCV: 86.2 fL (ref 80.0–100.0)
Platelets: 263 10*3/uL (ref 150–400)
RBC: 4.78 MIL/uL (ref 4.22–5.81)
RDW: 14.5 % (ref 11.5–15.5)
WBC: 7.8 10*3/uL (ref 4.0–10.5)
nRBC: 0 % (ref 0.0–0.2)

## 2018-10-02 LAB — ETHANOL: Alcohol, Ethyl (B): <10 mg/dL (ref <10)

## 2018-10-02 LAB — ACETAMINOPHEN LEVEL: Acetaminophen (Tylenol), Serum: <10 ug/mL — ABNORMAL LOW (ref 10–30)

## 2018-10-02 LAB — SALICYLATE LEVEL: Salicylate Lvl: <7 mg/dL (ref 2.8–30.0)

## 2018-10-02 NOTE — ED Notes (Signed)
Pt placed in purple scrubs and wanded by security, belongings placed in bag.

## 2018-10-02 NOTE — ED Notes (Signed)
In charge of group home: [phone] Call with updates.

## 2018-10-02 NOTE — ED Notes (Signed)
PT COULD NOT VOID AT THIS TIME,

## 2018-10-02 NOTE — ED Triage Notes (Signed)
Pt here via [employer], was reported missing by his group home, police located him at [facility]. Pt is bipolar and schizophrenic, reports hearing voices telling him to swallow clear green pebbles some time last week.

## 2018-10-02 NOTE — ED Notes (Signed)
Pt transported to XRAY °

## 2018-10-02 NOTE — ED Provider Notes (Signed)
[facility] EMERGENCY DEPARTMENT Provider Note   CSN: [id number] Arrival date & time: 10/02/18  2025    History   Chief Complaint Chief Complaint  Patient presents with  . Suicidal    HPI Lawrence Morgan is a 38 y.o. male.    38 year old male with a history of personality disorder, schizoaffective schizophrenia, PTSD, intellectual disability presents to the emergency department by [employer].  He was reported missing by his group home and [employer] was able to locate the patient at [facility].  He states that he feels as though he needs to go to [facility] for stabilization.  He no longer likes where he is living.  When the individuals at his group home yell at him, it makes him feel like he wants to kill himself.  He is not suicidal at this time.  No specific homicidal ideations, but he does envision decapitate him nondescript individuals.  Was hearing voices earlier in the day today, but denies hallucinations at this time.  Has a history of foreign body ingestion.  States that he ate 17 pebbles 1 week ago.  No current complaints of abdominal pain.  The history is provided by the patient. No language interpreter was used.    Past Medical History:  Diagnosis Date  . Intellectual disability    mild  . Marijuana use   . Personality disorder (HCC)   . PTSD (post-traumatic stress disorder)   . 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 Schizoaffective disorder, bipolar type [facility])     Patient Active Problem List   Diagnosis Date Noted  . Ingestion of foreign body, initial encounter 04/29/2018  . Suicide ideation 04/29/2018  . Schizoaffective disorder, bipolar type (HCC)   . PTSD (post-traumatic stress disorder)     Past Surgical History:  Procedure Laterality Date  . ESOPHAGOGASTRODUODENOSCOPY (EGD) WITH PROPOFOL N/A 04/30/2018   Procedure: ESOPHAGOGASTRODUODENOSCOPY (EGD) WITH PROPOFOL;  Surgeon: Kathi Der, MD;  Location: [facility];  Service:  Gastroenterology;  Laterality: N/A;  . FOREIGN BODY REMOVAL N/A 04/30/2018   Procedure: FOREIGN BODY REMOVAL;  Surgeon: Kathi Der, MD;  Location: [facility] ENDOSCOPY;  Service: Gastroenterology;  Laterality: N/A;  . FRACTURE SURGERY      r ankle         Home Medications    Prior to Admission medications   Medication Sig Start Date End Date Taking? Authorizing Provider  albuterol (PROVENTIL HFA;VENTOLIN HFA) 108 (90 Base) MCG/ACT inhaler Inhale 2 puffs into the lungs every 6 (six) hours as needed for wheezing or shortness of breath.    [provider]  atomoxetine (STRATTERA) 25 MG capsule Take 25 mg by mouth daily.    [provider]  benztropine (COGENTIN) 0.5 MG tablet Take 0.5 mg by mouth daily.    [provider]  cetirizine (ZYRTEC) 10 MG tablet Take 10 mg by mouth daily.    [provider]  Cholecalciferol (VITAMIN D3) 50 MCG (2000 UT) capsule Take 2,000 Units by mouth daily.    [provider]  cloZAPine (CLOZARIL) 100 MG tablet Take 2.5 tablets (250 mg total) by mouth at bedtime. 05/10/18   Laverna Peace, MD  Docusate Sodium 100 MG capsule Take 200 mg by mouth 2 (two) times daily.    [provider]  hydrOXYzine (ATARAX/VISTARIL) 25 MG tablet Take 1 tablet (25 mg total) by mouth 3 (three) times daily. 06/05/18   Charm Rings, NP  lithium carbonate (LITHOBID) 300 MG CR tablet Take 1 tablet (300 mg total)  by mouth daily with breakfast. 06/06/18   Charm Rings, NP  lithium carbonate (LITHOBID) 300 MG CR tablet Take 2 tablets (600 mg total) by mouth at bedtime. 06/05/18   Charm Rings, NP  paliperidone (INVEGA SUSTENNA) 234 MG/1.5ML SUSY injection Inject 234 mg into the muscle every 30 (thirty) days.    [provider]  pantoprazole (PROTONIX) 40 MG tablet Take 1 tablet (40 mg total) by mouth daily. 05/10/18   Roberto Scales D, MD  polyethylene glycol (MIRALAX / GLYCOLAX) packet Take 17 g by mouth daily.    [provider]  sertraline (ZOLOFT) 100 MG tablet Take 100 mg by mouth daily.    [provider]  traZODone (DESYREL) 100 MG tablet Take 2 tablets (200 mg total) by mouth at bedtime. 06/05/18   Charm Rings, NP    Family History History reviewed. No pertinent family history.  Social History Social History   Tobacco Use  . Smoking status: Current Every Day Smoker    Types: Cigars, Cigarettes  . Smokeless tobacco: Never Used  Substance Use Topics  . Alcohol use: Not Currently  . Drug use: Yes    Frequency: 3.0 times per week    Types: Marijuana     Allergies   Haldol [haloperidol lactate] and Shellfish allergy   Review of Systems Review of Systems Ten systems reviewed and are negative for acute change, except as noted in the HPI.    Physical Exam Updated Vital Signs BP 137/89 (BP Location: Right Arm)   Pulse 77   Temp 97.9 F (36.6 C) (Oral)   Resp 18   SpO2 100%   Physical Exam Vitals signs and nursing note reviewed.  Constitutional:      General: He is not in acute distress.    Appearance: He is well-developed. He is not diaphoretic.     Comments: Nontoxic appearing and in NAD  HENT:     Head: Normocephalic and atraumatic.  Eyes:     General: No scleral icterus.    Conjunctiva/sclera: Conjunctivae normal.  Neck:     Musculoskeletal: Normal range of motion.  Cardiovascular:     Rate and Rhythm: Normal rate and regular rhythm.     Pulses: Normal pulses.  Pulmonary:     Effort: Pulmonary effort is normal. No respiratory distress.     Comments: Respirations even and unlabored Musculoskeletal: Normal range of motion.  Skin:    General: Skin is warm and dry.     Coloration: Skin is not pale.     Findings: No erythema or rash.  Neurological:     General: No focal deficit present.     Mental Status: He is alert and oriented to person, place, and time.     Coordination: Coordination normal.  Psychiatric:        Behavior: Behavior is cooperative.      Comments: Patient denies any suicidal ideations at this time.  He is not reacting to internal stimuli.  Reports hallucinations earlier today.      ED Treatments / Results  Labs (all labs ordered are listed, but only abnormal results are displayed) Labs Reviewed  COMPREHENSIVE METABOLIC PANEL - Abnormal; Notable for the following components:      Result Value   Glucose, Bld 102 (*)    All other components within normal limits  ACETAMINOPHEN LEVEL - Abnormal; Notable for the following components:   Acetaminophen (Tylenol), Serum <10 (*)    All other components within normal limits  ETHANOL  SALICYLATE LEVEL  CBC  RAPID URINE DRUG SCREEN, HOSP PERFORMED  LITHIUM LEVEL    EKG None  Radiology Dg Abd 2 Views  Result Date: 10/02/2018 CLINICAL DATA:  38 y/o  M; swallowed foreign body. EXAM: ABDOMEN - 2 VIEW COMPARISON:  06/02/2018 abdomen radiographs. FINDINGS: Approximately 4 cm focal foreign body in the right mid abdomen is likely the same foreign body on prior radiographs. There is a new approximately 2.5 cm oval foreign body in the left upper quadrant. No findings of bowel obstruction. No free air. Bones are unremarkable. IMPRESSION: 1. Approximately 4 cm focal foreign body in the right mid abdomen is likely the same foreign body on prior radiographs. 2. New approximately 2.5 cm oval foreign body in the left upper quadrant. Electronically Signed   By: Mitzi HansenLance  Furusawa-Stratton M.D.   On: 10/02/2018 23:47    Procedures Procedures (including critical care time)  Medications Ordered in ED Medications  lithium carbonate (LITHOBID) CR tablet 600 mg (has no administration in time range)  hydrOXYzine (ATARAX/VISTARIL) tablet 25 mg (25 mg Oral Given 10/03/18 0434)  traZODone (DESYREL) tablet 200 mg (200 mg Oral Given 10/03/18 0434)  lithium carbonate (LITHOBID) CR tablet 300 mg (has no administration in time range)  loratadine (CLARITIN) tablet 10 mg (has no administration in time range)   albuterol (VENTOLIN HFA) 108 (90 Base) MCG/ACT inhaler 2 puff (has no administration in time range)     2:08 AM Patient with reassuring labs.  X-ray shows old foreign body which is stable.  New foreign body of 2.5 cm in the left upper quadrant is new.  No signs of obstruction, perforation/free air.  Medically cleared at this time.   Initial Impression / Assessment and Plan / ED Course  I have reviewed the triage vital signs and the nursing notes.  Pertinent labs & imaging results that were available during my care of the patient were reviewed by me and considered in my medical decision making (see chart for details).        38 year old male presents to the emergency department for evaluation by psychiatry.  He has been medically cleared and seen by TTS who recommend inpatient treatment.  Will require placement at outside facility.  Disposition to be determined by oncoming ED provider.   Final Clinical Impressions(s) / ED Diagnoses   Final diagnoses:  Swallowed foreign body, initial encounter  Schizoaffective disorder, bipolar type [facility](HCC)    ED Discharge Orders    None       Antony MaduraHumes, , PA-C 10/03/18 [id number]    Glynn Octaveancour, Stephen, MD 10/03/18 [phone]

## 2018-10-03 DIAGNOSIS — F29 Unspecified psychosis not due to a substance or known physiological condition: Secondary | ICD-10-CM

## 2018-10-03 DIAGNOSIS — R45850 Homicidal ideations: Secondary | ICD-10-CM

## 2018-10-03 DIAGNOSIS — R45851 Suicidal ideations: Secondary | ICD-10-CM | POA: Diagnosis not present

## 2018-10-03 DIAGNOSIS — F250 Schizoaffective disorder, bipolar type: Secondary | ICD-10-CM | POA: Diagnosis not present

## 2018-10-03 LAB — RAPID URINE DRUG SCREEN, HOSP PERFORMED
Amphetamines: NOT DETECTED
Amphetamines: NOT DETECTED
Barbiturates: NOT DETECTED
Barbiturates: NOT DETECTED
Benzodiazepines: NOT DETECTED
Benzodiazepines: NOT DETECTED
Cocaine: NOT DETECTED
Cocaine: NOT DETECTED
Opiates: NOT DETECTED
Opiates: NOT DETECTED
Tetrahydrocannabinol: POSITIVE — AB
Tetrahydrocannabinol: POSITIVE — AB

## 2018-10-03 MED ORDER — TRAZODONE HCL 100 MG PO TABS
200.0000 mg | ORAL_TABLET | Freq: Every day | ORAL | Status: DC
  Administered 2018-10-03 – 2018-10-05 (×4): 200 mg via ORAL
  Filled 2018-10-03 (×3): qty 2
  Filled 2018-10-03: qty 4
  Filled 2018-10-03: qty 2

## 2018-10-03 MED ORDER — LITHIUM CARBONATE ER 300 MG PO TBCR
300.0000 mg | EXTENDED_RELEASE_TABLET | Freq: Every day | ORAL | Status: DC
  Administered 2018-10-03 – 2018-10-06 (×4): 300 mg via ORAL
  Filled 2018-10-03 (×4): qty 1

## 2018-10-03 MED ORDER — ALBUTEROL SULFATE HFA 108 (90 BASE) MCG/ACT IN AERS: 2.0000 | INHALATION_SPRAY | Freq: Four times a day (QID) | RESPIRATORY_TRACT | Status: DC | PRN

## 2018-10-03 MED ORDER — LORATADINE 10 MG PO TABS
10.0000 mg | ORAL_TABLET | Freq: Every day | ORAL | Status: DC
  Administered 2018-10-03 – 2018-10-06 (×4): 10 mg via ORAL
  Filled 2018-10-03 (×4): qty 1

## 2018-10-03 MED ORDER — LITHIUM CARBONATE ER 300 MG PO TBCR
600.0000 mg | EXTENDED_RELEASE_TABLET | Freq: Every day | ORAL | Status: DC
  Administered 2018-10-03 – 2018-10-05 (×3): 600 mg via ORAL
  Filled 2018-10-03 (×4): qty 2

## 2018-10-03 MED ORDER — HYDROXYZINE HCL 25 MG PO TABS
25.0000 mg | ORAL_TABLET | Freq: Three times a day (TID) | ORAL | Status: DC
  Administered 2018-10-03 – 2018-10-06 (×12): 25 mg via ORAL
  Filled 2018-10-03 (×12): qty 1

## 2018-10-03 NOTE — ED Notes (Signed)
Breakfast tray ordered 

## 2018-10-03 NOTE — ED Notes (Signed)
Breakfast ordered 

## 2018-10-03 NOTE — ED Notes (Signed)
Pt given snack and water

## 2018-10-03 NOTE — ED Notes (Signed)
Patient became agitated and threw his apple sauce across the pod

## 2018-10-03 NOTE — BH Assessment (Addendum)
Tele Assessment Note   Patient Name: Lawrence Morgan MRN: [id number] Referring Physician: Octavia Heir, PA-C Location of Patient: [facility] Location of Provider: Behavioral Health TTS Department  Lawrence Morgan is a 38 y.o. male who came to [facility] ED due the belief that he is a Blood and that everywhere he is he's a danger to society. Pt states he stays at a group home, though he cannot remember the group home's name, and that his legal guardian is his father. He shares he is currently experiencing SI and that he has been eating what the voices tell him to eat, which has, as of late, been small green pebbles.  Pt acknowledges SI and HI, though he cannot give any specific information about either. When clinician questions pt about specifics of either, pt becomes loud and uses profanity; pt is easily re-directed. When clinician inquires about AVH, pt verifies that he has been experiencing this as well and gives a very specific hallucination he has seen of bodies in a woodchipper. Clinician inquired about this, stating that she had read in one of pt's previous notes by a psychiatrist that pt admitted he had been making that hallucination up. Pt again became loud and used profanity but did not admit nor deny he had made these hallucinations up.  Clinician gave clinician verbal consent to contact his father, Lawrence Morgan, for collateral. Clinician called and left his father a HIPAA-compliant voicemail message requesting a call back at [phone].  Pt is oriented x4. His recent and remote memory is intact. Pt was overall somewhat cooperative, though it appears he may have been at his baseline. Pt's insight, judgement, and impulse control is impaired at this time.   Diagnosis: F25.0, Schizoaffective disorder, Bipolar type   Past Medical History:  Past Medical History:  Diagnosis Date  . Intellectual disability    mild  . Marijuana use   . Personality disorder (HCC)   . PTSD (post-traumatic stress  disorder)   . 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 Schizoaffective disorder, bipolar type [facility])     Past Surgical History:  Procedure Laterality Date  . ESOPHAGOGASTRODUODENOSCOPY (EGD) WITH PROPOFOL N/A 04/30/2018   Procedure: ESOPHAGOGASTRODUODENOSCOPY (EGD) WITH PROPOFOL;  Surgeon: Kathi Der, MD;  [facility];  Service: Gastroenterology;  Laterality: N/A;  . FOREIGN BODY REMOVAL N/A 04/30/2018   Procedure: FOREIGN BODY REMOVAL;  Surgeon: Kathi Der, MD;  Location: [facility];  Service: Gastroenterology;  Laterality: N/A;  . FRACTURE SURGERY      r ankle     Family History: History reviewed. No pertinent family history.  Social History:  reports that he has been smoking cigars and cigarettes. He has never used smokeless tobacco. He reports previous alcohol use. He reports current drug use. Frequency: 3.00 times per week. Drug: Marijuana.  Additional Social History:  Alcohol / Drug Use Pain Medications: Please see MAR Prescriptions: Please see MAR Over the Counter: Please see MAR History of alcohol / drug use?: Yes Longest period of sobriety (when/how long): 4 years Substance #1 Name of Substance 1: Marijuana 1 - Age of First Use: Unknown 1 - Amount (size/oz): 1/2 blunt 1 - Frequency: "Every 4-5 years" 1 - Duration: Unknown 1 - Last Use / Amount: Today  CIWA: CIWA-Ar BP: (!) 120/91 Pulse Rate: (!) 139 COWS:    Allergies:  Allergies  Allergen Reactions  . Haldol [Haloperidol Lactate]   . Shellfish Allergy Itching and Rash    Home Medications: (Not in a hospital admission)  OB/GYN Status:  No LMP for male patient.  General Assessment Data Location of Assessment: [facility] ED TTS Assessment: In system Is this a Tele or Face-to-Face Assessment?: Tele Assessment Is this an Initial Assessment or a Re-assessment for this encounter?: Initial Assessment Patient Accompanied by:: N/A Language Other than English: No Living Arrangements:  In Group Home: (Comment: Name of Group Home) What gender do you identify as?: Male Marital status: Single Maiden name: Pharo Pregnancy Status: No Living Arrangements: Group Home Can pt return to current living arrangement?: Yes Admission Status: Voluntary Is patient capable of signing voluntary admission?: Yes Referral Source: Self/Family/Friend Insurance type: Medicare     Crisis Care Plan Living Arrangements: Group Home Legal Guardian: Father Name of Psychiatrist: None Name of Therapist: None  Education Status Is patient currently in school?: No Is the patient employed, unemployed or receiving disability?: Receiving disability income  Risk to self with the past 6 months Suicidal Ideation: Yes-Currently Present Has patient been a risk to self within the past 6 months prior to admission? : Yes Suicidal Intent: Yes-Currently Present Has patient had any suicidal intent within the past 6 months prior to admission? : Yes Is patient at risk for suicide?: Yes Suicidal Plan?: No Has patient had any suicidal plan within the past 6 months prior to admission? : No Access to Means: No What has been your use of drugs/alcohol within the last 12 months?: Pt states he smoked marijuana with a friend he saw today; otherwise he doesn't use on a regular basis Previous Attempts/Gestures: No How many times?: 0 Other Self Harm Risks: Pt has been consuming non-edible objects Triggers for Past Attempts: None known Intentional Self Injurious Behavior: (Consuming) Family Suicide History: Unknown Recent stressful life event(s): Conflict (Comment)(Conflict with group home) Persecutory voices/beliefs?: No Depression: Yes Depression Symptoms: Loss of interest in usual pleasures, Feeling worthless/self pity, Feeling angry/irritable Substance abuse history and/or treatment for substance abuse?: Yes Suicide prevention information given to non-admitted patients: Not applicable  Risk to Others within  the past 6 months Homicidal Ideation: No Does patient have any lifetime risk of violence toward others beyond the six months prior to admission? : No Thoughts of Harm to Others: No Current Homicidal Intent: No Current Homicidal Plan: No Access to Homicidal Means: No Identified Victim: None noted History of harm to others?: No Assessment of Violence: On admission Violent Behavior Description: None noted Does patient have access to weapons?: No(Pt denies access to weapons) Criminal Charges Pending?: No Does patient have a court date: No Is patient on probation?: No  Psychosis Hallucinations: None noted Delusions: Grandiose(Pt states he was a Blood & that he's "dangerous to society")  Mental Status Report Appearance/Hygiene: In scrubs Eye Contact: Fair Motor Activity: Agitation Speech: Aggressive, Argumentative Level of Consciousness: Alert Mood: Anxious, Irritable Affect: Angry Anxiety Level: Minimal Thought Processes: Flight of Ideas Judgement: Impaired Orientation: Person, Place, Time, Situation  Cognitive Functioning Concentration: Decreased Memory: Recent Intact, Remote Intact Is patient IDD: Yes Insight: Fair Impulse Control: Fair  ADLScreening [facility]) Patient's cognitive ability adequate to safely complete daily activities?: Yes Patient able to express need for assistance with ADLs?: Yes Independently performs ADLs?: Yes (appropriate for developmental age)  Prior Inpatient Therapy Prior Inpatient Therapy: Yes [facility] Reason for Treatment: SI  Prior Outpatient Therapy Prior Outpatient Therapy: Yes Prior Therapy Dates: Unknown Prior Therapy Facilty/Provider(s): [facility] Reason for Treatment: N/A Does patient have an ACCT team?: Unknown Does patient have Intensive In-House Services?  : No  Does patient have [facility] services? : Unknown Does patient have P4CC services?: Unknown  ADL Screening (condition  at time of admission) Patient's cognitive ability adequate to safely complete daily activities?: Yes Is the patient deaf or have difficulty hearing?: No Does the patient have difficulty seeing, even when wearing glasses/contacts?: No Does the patient have difficulty concentrating, remembering, or making decisions?: Yes Patient able to express need for assistance with ADLs?: Yes Does the patient have difficulty dressing or bathing?: No Independently performs ADLs?: Yes (appropriate for developmental age) Does the patient have difficulty walking or climbing stairs?: No Weakness of Legs: None Weakness of Arms/Hands: None  Home Assistive Devices/Equipment Home Assistive Devices/Equipment: None  Therapy Consults (therapy consults require a physician order) PT Evaluation Needed: No OT Evalulation Needed: No SLP Evaluation Needed: No Abuse/Neglect Assessment (Assessment to be complete while patient is alone) Abuse/Neglect Assessment Can Be Completed: Unable to assess, patient is non-responsive or altered mental status Values / Beliefs Cultural Requests During Hospitalization: None Spiritual Requests During Hospitalization: None Consults Spiritual Care Consult Needed: No Social Work Consult Needed: No Merchant navy officerAdvance Directives (For Healthcare) Does Patient Have a Medical Advance Directive?: Unable to assess, patient is non-responsive or altered mental status Would patient like information on creating a medical advance directive?: No - Patient declined        Disposition: Nira ConnJason Berry, NP, reviewed pt's chart and information and determined pt meets criteria for inpatient hospitalization. Pt is IDD and requires particular programming, so his referral will be sent out to specific hospitals that can meet his needs tomorrow.   Disposition Initial Assessment Completed for this Encounter: Yes Patient referred to: Other (Comment)(Pt's referral information was faxed to multiple hospitals)  This  service was provided via telemedicine using a 2-way, interactive audio and video technology.  Names of all persons participating in this telemedicine service and their role in this encounter. Name: Jason FilaRodney Seltzer Role: Patient  Name: Duard BradySamantha  Role: Clinician    Ralph DowdySamantha L  10/03/2018 4:52 AM

## 2018-10-03 NOTE — Progress Notes (Signed)
Pt meets inpatient criteria per Nira Conn, NP. Referral information has been sent to the following hospitals for review:  [facility]  [facility]  [facility]   Disposition will continue to assist with inpatient placement needs.   Wells Guiles, LCSW, LCAS Disposition CSW [facility]/TTS [phone]

## 2018-10-03 NOTE — ED Notes (Signed)
Patient throwing snack across room

## 2018-10-03 NOTE — ED Notes (Signed)
Belongings inventoried and placed in locker 1 ?

## 2018-10-03 NOTE — ED Notes (Signed)
Dinner tray ordered.

## 2018-10-03 NOTE — Consult Note (Addendum)
Telepsych Consultation   Reason for Consult:  Suicidal ideations, homicidal ideations, psychosis  Referring Physician:  EDP Location of Patient:  [facility] Location of Provider: [facility]  Patient Identification: Lawrence Morgan MRN:  [id number] Principal Diagnosis: Schizoaffective disorder, bipolar type (HCC) Diagnosis:  Principal Problem:   Schizoaffective disorder, bipolar type (HCC)   Total Time spent with patient: 15 minutes  Subjective:   Lawrence Morgan is a 38 y.o. male patient admitted with uicidal ideations, homicidal ideations, psychosis.  HPI: This is a 38 year old male who lives in a group home. He requires psychiatric consultation after endorsing suicidal ideation, homicidal ideation, and hallucinations and being taken to the ED by [employer].  During this consultation, patient continues to endorses concerns as noted above. He reports he is a danger to himself and others.  When discussing homicidal ideations, he states," I feel like decapitating a man." His homicidal ideations are not directed to anyone specific. He describes hallucinations as," I have visions of people getting shot and murdered. I see people being thrown into a weed eater. I hear things too but not as much as I see things." Per review of chart, he inititally reported that he has been eating what the voices tell him to eat, which has been small green pebbles. He has a history of Bipolar and schizoaffective disorder. When discussing past suicide attempts he states, " I killed myself before then was resurrected." He reports at lease two prior inpatient psychiatric hospitalizations. He receives medication management with Dr. Merlyn Albert at [facility] as self-reported. Admits to smoking mariajuana although denies other substance abuse or use. Reports his last use of marijuana was yesterday, prior to going to the ED.   Past Psychiatric History: Bipolar and schizoaffective disorder. Receives medication management with  Dr. Merlyn Albert at [place]. Reports at least two prior psychiatric hospitalizations.   Risk to Self: Suicidal Ideation: Yes-Currently Present Suicidal Intent: Yes-Currently Present Is patient at risk for suicide?: Yes Suicidal Plan?: No Access to Means: No What has been your use of drugs/alcohol within the last 12 months?: Pt states he smoked marijuana with a friend he saw today; otherwise he doesn't use on a regular basis How many times?: 0 Other Self Harm Risks: Pt has been consuming non-edible objects Triggers for Past Attempts: None known Intentional Self Injurious Behavior: (Consuming) Risk to Others: Homicidal Ideation: No Thoughts of Harm to Others: No Current Homicidal Intent: No Current Homicidal Plan: No Access to Homicidal Means: No Identified Victim: None noted History of harm to others?: No Assessment of Violence: On admission Violent Behavior Description: None noted Does patient have access to weapons?: No(Pt denies access to weapons) Criminal Charges Pending?: No Does patient have a court date: No Prior Inpatient Therapy: Prior Inpatient Therapy: Yes Prior Therapy [facility]Provider([facility] Reason for Treatment: SI Prior Outpatient Therapy: Prior Outpatient Therapy: Yes Prior Therapy Dates: Unknown Prior Therapy Facilty/Provider(s): [facility] Reason for Treatment: N/A Does patient have an ACCT team?: Unknown Does patient have Intensive In-House Services?  : No Does patient have [facility] services? : Unknown Does patient have P4CC services?: Unknown  Past Medical History:  Past Medical History:  Diagnosis Date  . Intellectual disability    mild  . Marijuana use   . Personality disorder (HCC)   . PTSD (post-traumatic stress disorder)   . 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 Schizoaffective disorder, bipolar type [facility])     Past Surgical History:  Procedure Laterality Date  . ESOPHAGOGASTRODUODENOSCOPY (EGD) WITH PROPOFOL N/A 04/30/2018  Procedure: ESOPHAGOGASTRODUODENOSCOPY (EGD) WITH PROPOFOL;  Surgeon: Kathi Der, MD;  [facility];  Service: Gastroenterology;  Laterality: N/A;  . FOREIGN BODY REMOVAL N/A 04/30/2018   Procedure: FOREIGN BODY REMOVAL;  Surgeon: Kathi Der, MD;  [facility];  Service: Gastroenterology;  Laterality: N/A;  . FRACTURE SURGERY      r ankle    Family History: History reviewed. No pertinent family history. Family Psychiatric  History: History reviewed. No pertinent family history. Social History:  Social History   Substance and Sexual Activity  Alcohol Use Not Currently     Social History   Substance and Sexual Activity  Drug Use Yes  . Frequency: 3.0 times per week  . Types: Marijuana    Social History   Socioeconomic History  . Marital status: Single    Spouse name: Not on file  . Number of children: Not on file  . Years of education: Not on file  . Highest education level: Not on file  Occupational History  . Not on file  Social Needs  . Financial resource strain: Not on file  . Food insecurity:    Worry: Not on file    Inability: Not on file  . Transportation needs:    Medical: Not on file    Non-medical: Not on file  Tobacco Use  . Smoking status: Current Every Day Smoker    Types: Cigars, Cigarettes  . Smokeless tobacco: Never Used  Substance and Sexual Activity  . Alcohol use: Not Currently  . Drug use: Yes    Frequency: 3.0 times per week    Types: Marijuana  . Sexual activity: Not Currently  Lifestyle  . Physical activity:    Days per week: Not on file    Minutes per session: Not on file  . Stress: Not on file  Relationships  . Social connections:    Talks on phone: Not on file    Gets together: Not on file    Attends religious service: Not on file    Active member of club or organization: Not on file    Attends meetings of clubs or organizations: Not on file    Relationship status: Not on file  Other Topics Concern   . Not on file  Social History Narrative  . Not on file   Additional Social History:    Allergies:   Allergies  Allergen Reactions  . Haldol [Haloperidol Lactate]   . Shellfish Allergy Itching and Rash    Labs:  Results for orders placed or performed during the hospital encounter of 10/02/18 (from the past 48 hour(s))  Comprehensive metabolic panel     Status: Abnormal   Collection Time: 10/02/18  8:33 PM  Result Value Ref Range   Sodium 141 135 - 145 mmol/L   Potassium 4.5 3.5 - 5.1 mmol/L   Chloride 105 98 - 111 mmol/L   CO2 27 22 - 32 mmol/L   Glucose, Bld 102 (H) 70 - 99 mg/dL   BUN 11 6 - 20 mg/dL   Creatinine, Ser 7.36 0.61 - 1.24 mg/dL   Calcium 68.1 8.9 - 59.4 mg/dL   Total Protein 7.0 6.5 - 8.1 g/dL   Albumin 4.5 3.5 - 5.0 g/dL   AST 23 15 - 41 U/L   ALT 10 0 - 44 U/L   Alkaline Phosphatase 90 38 - 126 U/L   Total Bilirubin 0.6 0.3 - 1.2 mg/dL   GFR calc non Af Amer >60 >60 mL/min   GFR calc  Af Amer >60 >60 mL/min   Anion gap 9 5 - 15    Comment: Performed at [facility], [address] [place], [place] [address]  Ethanol     Status: None   Collection Time: 10/02/18  8:33 PM  Result Value Ref Range   Alcohol, Ethyl (B) <10 <10 mg/dL    Comment: (NOTE) Lowest detectable limit for serum alcohol is 10 mg/dL. For medical purposes only. Performed at [facility], [address] [place], [place] [address]   Salicylate level     Status: None   Collection Time: 10/02/18  8:33 PM  Result Value Ref Range   Salicylate Lvl <7.0 2.8 - 30.0 mg/dL    Comment: Performed at [facility], [address] [place], [place] [address]  Acetaminophen level     Status: Abnormal   Collection Time: 10/02/18  8:33 PM  Result Value Ref Range   Acetaminophen (Tylenol), Serum <10 (L) 10 - 30 ug/mL    Comment: Performed at [facility], [address] [place], [place] [address]  cbc     Status: None   Collection Time: 10/02/18  8:33 PM  Result Value  Ref Range   WBC 7.8 4.0 - 10.5 K/uL   RBC 4.78 4.22 - 5.81 MIL/uL   Hemoglobin 13.0 13.0 - 17.0 g/dL   HCT 24.441.2 01.039.0 - 27.252.0 %   MCV 86.2 80.0 - 100.0 fL   MCH 27.2 26.0 - 34.0 pg   MCHC 31.6 30.0 - 36.0 g/dL   RDW 53.614.5 64.411.5 - 03.415.5 %   Platelets 263 150 - 400 K/uL   nRBC 0.0 0.0 - 0.2 %    Comment: Performed at [facility], [address] [place], [place] [address]    Medications:  Current Facility-Administered Medications  Medication Dose Route Frequency Provider Last Rate Last Dose  . albuterol (VENTOLIN HFA) 108 (90 Base) MCG/ACT inhaler 2 puff  2 puff Inhalation Q6H PRN Antony MaduraHumes, Kelly, PA-C      . hydrOXYzine (ATARAX/VISTARIL) tablet 25 mg  25 mg Oral TID Antony MaduraHumes, Kelly, PA-C   25 mg at 10/03/18 0950  . lithium carbonate (LITHOBID) CR tablet 300 mg  300 mg Oral Q breakfast Antony MaduraHumes, Kelly, PA-C   300 mg at 10/03/18 0948  . lithium carbonate (LITHOBID) CR tablet 600 mg  600 mg Oral QHS Humes, Kelly, PA-C      . loratadine (CLARITIN) tablet 10 mg  10 mg Oral Daily Antony MaduraHumes, Kelly, PA-C   10 mg at 10/03/18 0950  . traZODone (DESYREL) tablet 200 mg  200 mg Oral QHS Antony MaduraHumes, Kelly, PA-C   200 mg at 10/03/18 [id number]   Current Outpatient Medications  Medication Sig Dispense Refill  . albuterol (PROVENTIL HFA;VENTOLIN HFA) 108 (90 Base) MCG/ACT inhaler Inhale 2 puffs into the lungs every 6 (six) hours as needed for wheezing or shortness of breath.    Marland Kitchen. atomoxetine (STRATTERA) 25 MG capsule Take 25 mg by mouth daily.    . benztropine (COGENTIN) 0.5 MG tablet Take 0.5 mg by mouth daily.    . cetirizine (ZYRTEC) 10 MG tablet Take 10 mg by mouth daily.    . Cholecalciferol (VITAMIN D3) 50 MCG (2000 UT) capsule Take 2,000 Units by mouth daily.    . cloZAPine (CLOZARIL) 100 MG tablet Take 2.5 tablets (250 mg total) by mouth at bedtime. 90 tablet 0  . Docusate Sodium 100 MG capsule Take 200 mg by mouth 2 (  two) times daily.    . hydrOXYzine (ATARAX/VISTARIL) 25 MG tablet Take 1 tablet (25 mg total) by  mouth 3 (three) times daily. 90 tablet 0  . lithium carbonate (LITHOBID) 300 MG CR tablet Take 1 tablet (300 mg total) by mouth daily with breakfast. 30 tablet 0  . lithium carbonate (LITHOBID) 300 MG CR tablet Take 2 tablets (600 mg total) by mouth at bedtime. 60 tablet 0  . omeprazole (PRILOSEC) 20 MG capsule Take 20 mg by mouth 2 (two) times daily before a meal.    . paliperidone (INVEGA SUSTENNA) 234 MG/1.5ML SUSY injection Inject 234 mg into the muscle every 30 (thirty) days.    . polyethylene glycol (MIRALAX / GLYCOLAX) packet Take 17 g by mouth daily.    . sertraline (ZOLOFT) 100 MG tablet Take 100 mg by mouth daily.    . pantoprazole (PROTONIX) 40 MG tablet Take 1 tablet (40 mg total) by mouth daily. (Patient not taking: Reported on 10/03/2018) 30 tablet 0    Musculoskeletal: Strength & Muscle Tone: unable to access  Gait & Station: normal Patient leans: N/A  Psychiatric Specialty Exam: Physical Exam  Nursing note and vitals reviewed. Constitutional: He is oriented to person, place, and time.  Neurological: He is alert and oriented to person, place, and time.    Review of Systems  Psychiatric/Behavioral: Positive for hallucinations and suicidal ideas.  All other systems reviewed and are negative.   Blood pressure 137/89, pulse 77, temperature 97.9 F (36.6 C), temperature source Oral, resp. rate 18, SpO2 100 %.There is no height or weight on file to calculate BMI.  General Appearance: Fairly Groomed  Eye Contact:  Good  Speech:  Clear and Coherent and Normal Rate  Volume:  Normal  Mood:  Depressed  Affect:  Full Range  Thought Process:  Disorganized and Descriptions of Associations: Circumstantial  Orientation:  Full (Time, Place, and Person)  Thought Content:  Hallucinations: Auditory Visual  Suicidal Thoughts:  Yes.  with intent/plan  Homicidal Thoughts:  Yes.  without intent/plan; endorses having thoughts of decapitating a man but no one specific   Memory:  Immediate;    Fair Recent;   Fair  Judgement:  Impaired  Insight:  Shallow  Psychomotor Activity:  unable tio access  Concentration:  Concentration: Fair and Attention Span: Fair  Recall:  [employer] of Knowledge:  Fair  Language:  Good  Akathisia:  Negative  Handed:  Right  AIMS (if indicated):     Assets:  Communication Skills Desire for Improvement Resilience  ADL's:  Intact  Cognition:  WNL  Sleep:        Treatment Plan Summary: Daily contact with patient to assess and evaluate symptoms and progress in treatment  Disposition: Recommending inpatient psychiatric hospitilization as patient does meet criiteria   This service was provided via telemedicine using a 2-way, interactive audio and Immunologist.  Names of all persons participating in this telemedicine service and their role in this encounter. Name: Denzil Magnuson Role: FNP-C  Name: Lawrence Morgan Role: Patient    Denzil Magnuson, NP 10/03/2018 10:14 AM

## 2018-10-03 NOTE — ED Notes (Signed)
Dinner tray at beside 

## 2018-10-04 DIAGNOSIS — F250 Schizoaffective disorder, bipolar type: Secondary | ICD-10-CM | POA: Diagnosis not present

## 2018-10-04 LAB — LITHIUM LEVEL: Lithium Lvl: 0.61 mmol/L (ref 0.60–1.20)

## 2018-10-04 MED ORDER — BENZTROPINE MESYLATE 1 MG PO TABS
0.5000 mg | ORAL_TABLET | Freq: Every day | ORAL | Status: DC
  Administered 2018-10-04 – 2018-10-06 (×3): 0.5 mg via ORAL
  Filled 2018-10-04 (×3): qty 1

## 2018-10-04 MED ORDER — ATOMOXETINE HCL 25 MG PO CAPS
25.0000 mg | ORAL_CAPSULE | Freq: Every day | ORAL | Status: DC
  Filled 2018-10-04 (×2): qty 1

## 2018-10-04 MED ORDER — SERTRALINE HCL 100 MG PO TABS
100.0000 mg | ORAL_TABLET | Freq: Every day | ORAL | Status: DC
  Administered 2018-10-04 – 2018-10-06 (×3): 100 mg via ORAL
  Filled 2018-10-04 (×3): qty 1

## 2018-10-04 MED ORDER — PALIPERIDONE PALMITATE ER 234 MG/1.5ML IM SUSY: 234.0000 mg | PREFILLED_SYRINGE | INTRAMUSCULAR | Status: DC

## 2018-10-04 MED ORDER — ATOMOXETINE HCL 25 MG PO CAPS
25.0000 mg | ORAL_CAPSULE | Freq: Every day | ORAL | Status: DC
  Administered 2018-10-04 – 2018-10-06 (×3): 25 mg via ORAL
  Filled 2018-10-04 (×7): qty 1

## 2018-10-04 MED ORDER — PANTOPRAZOLE SODIUM 40 MG PO TBEC
80.0000 mg | DELAYED_RELEASE_TABLET | Freq: Every day | ORAL | Status: DC
  Administered 2018-10-04 – 2018-10-06 (×3): 80 mg via ORAL
  Filled 2018-10-04 (×3): qty 2

## 2018-10-04 MED ORDER — POLYETHYLENE GLYCOL 3350 17 G PO PACK
17.0000 g | PACK | Freq: Every day | ORAL | Status: DC
  Filled 2018-10-04 (×2): qty 1

## 2018-10-04 MED ORDER — CLOZAPINE 25 MG PO TABS
250.0000 mg | ORAL_TABLET | Freq: Every day | ORAL | Status: DC
  Administered 2018-10-04 – 2018-10-05 (×2): 250 mg via ORAL
  Filled 2018-10-04 (×4): qty 2

## 2018-10-04 NOTE — ED Notes (Signed)
Pt c/o rt shoulder pain he dislocated the shoulder 2 weeks ago  And the pt reports that he reduced the shoulder himself pressing the arm against a wall    Moving the shoulder with good range of motion

## 2018-10-04 NOTE — Progress Notes (Signed)
Contacted patient's father and legal guardian, [name], [phone]).  Mr. 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 Arnaldo Natal stated that patient had been psychologically tested when he was an adolescent but Mr. Arnaldo Natal was unsuccessful in obtaining copies of that information as he was told that the paperwork had been destroyed.  Mr. Arnaldo Natal noted that patient had resided in the [facility] group home for 3-4 years and had :a love-hate relationship" with the staff and other residents.  Mr. Arnaldo Natal also stated that his son had been talking about voices telling him to "do bad things" for years. He noted that patient had been charged with assault "once about 20 years ago" but has not carried through on what his voices tell him to do.    Mr. Arnaldo Natal was concerned that patient may not be on his home medications and asked that this writer contact group home manager, Letitia Caul [phone]) to ensure that he has been restarted on his home meds.  This Clinical research associate explained that it would be difficult to place patient with his IDD diagnosis and no documentation of his IQ, the typical measure of which needs to be = or >65 FSIQ.    CSW then called Hassel Neth at [facility] group home.  She does not have any records related to pt's FSIQ.  She did say that he does hear voices as staff has observed him talking out loud in his room to no one.  She noted that this is pt's baseline but she was not aware of what he says the voices tell him to do because when he is asked about it by his psychiatrist, he denies hearing voices. Ms. Lily Peer stated she was unaware of what patient is actually thinking the voices are telling him.  Ms. Lily Peer also related that the group home has decided to give patient a 30 day eviction notice but are willing for him to come back to their group home if he is psychiatrically cleared, so that they can assist with another  placement.  CSW will continue to follow for disposition and placement.  Timmothy Euler. Kaylyn Lim, MSW, LCSW Disposition Clinical Social Work [phone] (cell) [phone] (office)

## 2018-10-04 NOTE — ED Notes (Signed)
Ordered pt's regular dinner tray. 

## 2018-10-04 NOTE — ED Notes (Signed)
Pt asked for the thermometer to be lowered in the room  Blanket offered not at this time  Asking for dinner time.  He appeared startled when I woke him up to take meds

## 2018-10-04 NOTE — ED Provider Notes (Signed)
Pt is a 38 year old male who initially presented to the ED on 05/04 for suicidal ideation. He was medically cleared and evaluated by TTS; meets inpatient criteria. No acute events overnight. Home meds reordered for patient; awaiting placement at this time.    Tanda Rockers, PA-C 10/04/18 1033    Little, Ambrose Finland, MD 10/04/18 1047

## 2018-10-04 NOTE — ED Notes (Signed)
Pt up walkiong around in his room

## 2018-10-04 NOTE — Progress Notes (Signed)
CSW sent referral to [facility] in [place], [place].  [facility] is a [facility] hospital that does accept patients with co-occurring MH and IDD or Autism disorders. Faxed referral documentation to [phone].  CSW will continue to follow for placement.  Timmothy Euler. Kaylyn Lim, MSW, LCSW Disposition Clinical Social Work [phone] (cell) [phone] (office)

## 2018-10-04 NOTE — BHH Counselor (Signed)
Reassessment- Pt reports SI/HI and AVH. Pt reports current mental health treatment. Pt states he cannot remember his current medications.  Pt continues to meet inpatient treatment.  Wolfgang Phoenix, [facility] Triage Specialist

## 2018-10-04 NOTE — ED Notes (Signed)
Breakfast ordered 

## 2018-10-04 NOTE — ED Notes (Signed)
Pt's dinner tray arrived. 

## 2018-10-04 NOTE — ED Notes (Signed)
At this time a few undergarments are placed in pt's locker and documented on personal belongings list. sheet.

## 2018-10-05 DIAGNOSIS — F250 Schizoaffective disorder, bipolar type: Secondary | ICD-10-CM | POA: Diagnosis not present

## 2018-10-05 NOTE — ED Notes (Signed)
Snack and drink was placed on pt. Bedside table.

## 2018-10-05 NOTE — ED Provider Notes (Signed)
Emergency Medicine Observation Re-evaluation Note  Lawrence Morgan is a 38 y.o. male, seen on rounds today.  Pt initially presented to the ED for complaints of Suicidal Currently, the patient is sleeping.  Physical Exam  BP 112/81 (BP Location: Right Arm)   Pulse 72   Temp 98.1 F (36.7 C) (Oral)   Resp 18   SpO2 99%  Physical Exam Sleeping, respirations even and unlabored. ED Course / MDM  VBT:YOMA   I have reviewed the labs performed to date as well as medications administered while in observation.  Recent changes in the last 24 hours include none, vitals stable, lithium level therapeutic. Plan  Current plan is for inpatient treatment, awaiting placement. Patient is not under full IVC at this time.   Jeannie Fend, PA-C 10/05/18 0045    Virgina Norfolk, DO 10/05/18 1621

## 2018-10-05 NOTE — ED Notes (Signed)
Regular Diet was ordered for Lunch. 

## 2018-10-05 NOTE — ED Notes (Signed)
Medicines for HS given now because he is used to going to bed and asleep at 2000 at home and he is preparing for sleep now. He asked if there was a psych bed for him yet and writer told him no word on him moving else where at this time. Will continue to monitor for safety and needs. Sitter at bedside.

## 2018-10-05 NOTE — BHH Counselor (Signed)
TTS Reassessment: Patient is alert and oriented x 4. He is sitting upright in bed, dressed in scrubs. Patient reports experiencing VH about 2 hours ago of "people getting chopped up by a weed eater." Patient states he is currently experiencing AH. Patient reports passive SI due to hallucinations. Patient asked to be referred to [facility] for long term care. This clinician explained he was under review at a number of hospitals but was unsure about [facility]. Stated I would discuss with CSW. Patient demonstrated understanding. He continues to meet in patient criteria. TTS will continue to seek placement.

## 2018-10-06 DIAGNOSIS — F250 Schizoaffective disorder, bipolar type: Secondary | ICD-10-CM

## 2018-10-06 DIAGNOSIS — R45851 Suicidal ideations: Secondary | ICD-10-CM

## 2018-10-06 DIAGNOSIS — R442 Other hallucinations: Secondary | ICD-10-CM | POA: Diagnosis not present

## 2018-10-06 NOTE — ED Notes (Addendum)
Discharge instructions discussed with Ms Lawrence Morgan of pt's group home. Also told to group home staff that pt off-handedly mentioned this RN history of running away in case he decides to run away again. Pt's legal guardian updated of discharge time

## 2018-10-06 NOTE — ED Provider Notes (Signed)
Emergency Medicine Observation Re-evaluation Note  Lawrence Morgan is a 38 y.o. male, seen on rounds today.  Pt initially presented to the ED for complaints of Suicidal Currently, the patient is bathing.  Physical Exam  BP 116/76 (BP Location: Right Arm)   Pulse 80   Temp 97.8 F (36.6 C) (Oral)   Resp 16   SpO2 98%  Physical Exam Alert, bathing. ED Course / MDM  TGG:YIRS   I have reviewed the labs performed to date as well as medications administered while in observation.  Recent changes in the last 24 hours include none. Plan  Current plan is for awaiting in patient placement. Patient is not under full IVC at this time.   Jeannie Fend, PA-C 10/06/18 8546    Wynetta Fines, MD 10/06/18 1302

## 2018-10-06 NOTE — ED Notes (Signed)
Pt making phone call. 

## 2018-10-06 NOTE — ED Notes (Signed)
Pt states "the voices told me to make a shank but I didn't do it." when asked about SI/HI. NP Money states this is consistent with what the group home says about his behavior and his discharge remains the same.

## 2018-10-06 NOTE — Consult Note (Signed)
Telepsych Consultation   Reason for Consult:  Reports hallucinations and SI Referring Physician:  EDP Location of Patient:  Location of Provider: [facility] Department  Patient Identification: Lawrence Morgan MRN:  [id number] Principal Diagnosis: Schizoaffective disorder, bipolar type ([place]) Diagnosis:  Principal Problem:   Schizoaffective disorder, bipolar type ([place])   Total Time spent with patient: 30 minutes  Subjective:   Lawrence Morgan is a 38 y.o. male patient reports today that he is still having some hallucinations.  He states that they have gotten better with the medication he was taking.  Then patient makes another statement that the hallucinations have gotten worse and now he sees his "dead people, dead people, dead people, dead people."  Patient continues stating that he does not want to go back to the group home and that he needs to be admitted to a state hospital for a very long term hospitalization.  He states that he needs to get help for his hallucinations.  The patient then details that he is probably had hallucinations since he was about 16 or 17 and they have not changed over the years.  Patient states that they have never went away completely even with medications.  Patient reports that he has been taking his medications as prescribed while he has been at the group home as well.  Patient states that he does not want to go back to the group home and wants the hospital to send him to family members that live in [place].  Patient is informed that he has a legal guardian that is up to his legal guardian.  HPI:   Patient is seen by me via tele-psych and have consulted with Dr. Dwyane Morgan.  Patient continues to report having hallucinations of auditory and visual.  He does not make any suicidal or homicidal ideations or comments to me but is been reported that patient has been continually making these comments.  Patient presents lying in his bed and is calm, pleasant, and  cooperative.  He has been reported the patient has been lying in bed and has been interacting appropriately with others.  Patient is not been showing any response to any type of internal stimuli.  I have contacted Lawrence Morgan, the group home director, and she reports that she does not know of any triggers and that the patient has a job and his  is restricted to how much she gets.  She states that the patient got his last paycheck and was demanding to get his  and they would not give him all of his  at one time because he will spend it.  She states after that the patient had started acting out more and then he ran away.  She reports that the patient has a long history of being manipulative by making comments about suicide, homicide, and hallucinations so that he could be sent to the hospital.  She states that the patient does have a job but he does not want to have the responsibilities of paying for his bills.  She states that he feels that he needs to have all of his  and that he continues to run away when he does not get what he wants.  She also reports that she understands that she must give a 30-day notice for him to be discharged but she feels that he has met the max benefits of her facility and that it is time for him to move to another facility but she will take him back for  the next 30 days been minimal.  I contacted the patient's father, Lawrence Morgan, and he reports the same that the patient has had a long history of being manipulative with numerous hospitalizations in the past.  He confirms that the patient cannot go live in [place] because those family members will take advantage of him and use all of his .  He states that he is glad that the group home director is managing his  because the patient would spend all of it on frivolous things or give it to family members who are taking advantage of him.  He also realizes and believes that the patient does not need to  be hospitalized and that he is being manipulative with his comments and that the patient can return home to the group home for the time being.  He is made aware that there could possibly be a 30-day notice to find the patient a new placement.  At this time patient does not meet inpatient criteria and is psychiatrically cleared.  I have contacted Lawrence Broad, PA-C and notified her of the recommendations.  Past Psychiatric History: schizoaffective, schizophrenia, MDD, PTSD, personality disorder  Risk to Self: Suicidal Ideation: Yes-Currently Present Suicidal Intent: Yes-Currently Present Is patient at risk for suicide?: Yes Suicidal Plan?: No Access to Means: No What has been your use of drugs/alcohol within the last 12 months?: Pt states he smoked marijuana with a friend he saw today; otherwise he doesn't use on a regular basis How many times?: 0 Other Self Harm Risks: Pt has been consuming non-edible objects Triggers for Past Attempts: None known Intentional Self Injurious Behavior: (Consuming) Risk to Others: Homicidal Ideation: No Thoughts of Harm to Others: No Current Homicidal Intent: No Current Homicidal Plan: No Access to Homicidal Means: No Identified Victim: None noted History of harm to others?: No Assessment of Violence: On admission Violent Behavior Description: None noted Does patient have access to weapons?: No(Pt denies access to weapons) Criminal Charges Pending?: No Does patient have a court date: No Prior Inpatient Therapy: Prior Inpatient Therapy: Yes Prior Therapy Facilty/Provider(s): Lawrence Morgan [facility] Reason for Treatment: SI Prior Outpatient Therapy: Prior Outpatient Therapy: Yes Prior Therapy Dates: Unknown Prior Therapy Facilty/Provider(s): Lawrence Morgan Reason for Treatment: N/A Does patient have an ACCT team?: Unknown Does patient have Intensive In-House Services?  : No Does patient have [facility] services? : Unknown Does patient have P4CC services?: Unknown  Past  Medical History:  Past Medical History:  Diagnosis Date  . Intellectual disability    mild  . Marijuana use   . Personality disorder ([place])   . PTSD (post-traumatic stress disorder)   . Schizo-affective schizophrenia ([place]) 04/29/2018  . Schizoaffective disorder, bipolar type [facility])     Past Surgical History:  Procedure Laterality Date  . ESOPHAGOGASTRODUODENOSCOPY (EGD) WITH PROPOFOL N/A 04/30/2018   Procedure: ESOPHAGOGASTRODUODENOSCOPY (EGD) WITH PROPOFOL;  Surgeon: Otis Brace, MD;  Location: [facility];  Service: Gastroenterology;  Laterality: N/A;  . FOREIGN BODY REMOVAL N/A 04/30/2018   Procedure: FOREIGN BODY REMOVAL;  Surgeon: Otis Brace, MD;  Location: [place];  Service: Gastroenterology;  Laterality: N/A;  . FRACTURE SURGERY      r ankle    Family History: History reviewed. No pertinent family history. Family Psychiatric  History: None reported Social History:  Social History   Substance and Sexual Activity  Alcohol Use Not Currently     Social History   Substance and Sexual Activity  Drug Use Yes  . Frequency: 3.0 times per week  .  Types: Marijuana    Social History   Socioeconomic History  . Marital status: Single    Spouse name: Not on file  . Number of children: Not on file  . Years of education: Not on file  . Highest education level: Not on file  Occupational History  . Not on file  Social Needs  . Financial resource strain: Not on file  . Food insecurity:    Worry: Not on file    Inability: Not on file  . Transportation needs:    Medical: Not on file    Non-medical: Not on file  Tobacco Use  . Smoking status: Current Every Day Smoker    Types: Cigars, Cigarettes  . Smokeless tobacco: Never Used  Substance and Sexual Activity  . Alcohol use: Not Currently  . Drug use: Yes    Frequency: 3.0 times per week    Types: Marijuana  . Sexual activity: Not Currently  Lifestyle  . Physical activity:    Days per week: Not on file     Minutes per session: Not on file  . Stress: Not on file  Relationships  . Social connections:    Talks on phone: Not on file    Gets together: Not on file    Attends religious service: Not on file    Active member of club or organization: Not on file    Attends meetings of clubs or organizations: Not on file    Relationship status: Not on file  Other Topics Concern  . Not on file  Social History Narrative  . Not on file   Additional Social History:    Allergies:   Allergies  Allergen Reactions  . Haldol [Haloperidol Lactate]   . Shellfish Allergy Itching and Rash    Labs:  Results for orders placed or performed during the hospital encounter of 10/02/18 (from the past 48 hour(s))  Lithium level     Status: None   Collection Time: 10/04/18  1:16 PM  Result Value Ref Range   Lithium Lvl 0.61 0.60 - 1.20 mmol/L    Comment: Performed at [facility] [address] [place], [place] [address]    Medications:  Current Facility-Administered Medications  Medication Dose Route Frequency Provider Last Rate Last Dose  . albuterol (VENTOLIN HFA) 108 (90 Base) MCG/ACT inhaler 2 puff  2 puff Inhalation Q6H PRN Antonietta Breach, PA-C      . atomoxetine (STRATTERA) capsule 25 mg  25 mg Oral Daily Little, Wenda Overland, MD   25 mg at 10/06/18 1053  . benztropine (COGENTIN) tablet 0.5 mg  0.5 mg Oral Daily Venter, Margaux, PA-C   0.5 mg at 10/06/18 1053  . cloZAPine (CLOZARIL) tablet 250 mg  250 mg Oral QHS Venter, Margaux, PA-C   250 mg at 10/05/18 2031  . hydrOXYzine (ATARAX/VISTARIL) tablet 25 mg  25 mg Oral TID Antonietta Breach, PA-C   25 mg at 10/06/18 1054  . lithium carbonate (LITHOBID) CR tablet 300 mg  300 mg Oral Q breakfast Antonietta Breach, PA-C   300 mg at 10/06/18 0830  . lithium carbonate (LITHOBID) CR tablet 600 mg  600 mg Oral QHS Antonietta Breach, PA-C   600 mg at 10/05/18 2031  . loratadine (CLARITIN) tablet 10 mg  10 mg Oral Daily Antonietta Breach, PA-C   10 mg at 10/06/18 1054   . pantoprazole (PROTONIX) EC tablet 80 mg  80 mg Oral Daily Venter, Margaux, PA-C   80 mg at 10/06/18 1054  .  polyethylene glycol (MIRALAX / GLYCOLAX) packet 17 g  17 g Oral Daily Venter, Margaux, PA-C      . sertraline (ZOLOFT) tablet 100 mg  100 mg Oral Daily Venter, Margaux, PA-C   100 mg at 10/06/18 1053  . traZODone (DESYREL) tablet 200 mg  200 mg Oral QHS Antonietta Breach, PA-C   200 mg at 10/05/18 2030   Current Outpatient Medications  Medication Sig Dispense Refill  . albuterol (PROVENTIL HFA;VENTOLIN HFA) 108 (90 Base) MCG/ACT inhaler Inhale 2 puffs into the lungs every 6 (six) hours as needed for wheezing or shortness of breath.    Marland Kitchen atomoxetine (STRATTERA) 25 MG capsule Take 25 mg by mouth daily.    . benztropine (COGENTIN) 0.5 MG tablet Take 0.5 mg by mouth daily.    . cetirizine (ZYRTEC) 10 MG tablet Take 10 mg by mouth daily.    . Cholecalciferol (VITAMIN D3) 50 MCG (2000 UT) capsule Take 2,000 Units by mouth daily.    . cloZAPine (CLOZARIL) 100 MG tablet Take 2.5 tablets (250 mg total) by mouth at bedtime. 90 tablet 0  . Docusate Sodium 100 MG capsule Take 200 mg by mouth 2 (two) times daily.    . hydrOXYzine (ATARAX/VISTARIL) 25 MG tablet Take 1 tablet (25 mg total) by mouth 3 (three) times daily. 90 tablet 0  . lithium carbonate (LITHOBID) 300 MG CR tablet Take 1 tablet (300 mg total) by mouth daily with breakfast. 30 tablet 0  . lithium carbonate (LITHOBID) 300 MG CR tablet Take 2 tablets (600 mg total) by mouth at bedtime. 60 tablet 0  . omeprazole (PRILOSEC) 20 MG capsule Take 20 mg by mouth 2 (two) times daily before a meal.    . paliperidone (INVEGA SUSTENNA) 234 MG/1.5ML SUSY injection Inject 234 mg into the muscle every 30 (thirty) days.    . polyethylene glycol (MIRALAX / GLYCOLAX) packet Take 17 g by mouth daily.    . sertraline (ZOLOFT) 100 MG tablet Take 100 mg by mouth daily.      Musculoskeletal: Strength & Muscle Tone: within normal limits Gait & Station:  normal Patient leans: N/A  Psychiatric Specialty Exam: Physical Exam  Nursing note and vitals reviewed. Constitutional: He is oriented to person, place, and time. He appears well-developed and well-nourished.  Cardiovascular: Normal rate.  Respiratory: Effort normal.  Musculoskeletal: Normal range of motion.  Neurological: He is alert and oriented to person, place, and time.  Skin: Skin is warm.    Review of Systems  Constitutional: Negative.   HENT: Negative.   Eyes: Negative.   Respiratory: Negative.   Cardiovascular: Negative.   Gastrointestinal: Negative.   Genitourinary: Negative.   Musculoskeletal: Negative.   Skin: Negative.   Neurological: Negative.   Endo/Heme/Allergies: Negative.   Psychiatric/Behavioral:       Patient makes numerous comments of hallucinations and SI and per reports these are manipulation attempts    Blood pressure 116/76, pulse 80, temperature 97.8 F (36.6 C), temperature source Oral, resp. rate 16, SpO2 98 %.There is no height or weight on file to calculate BMI.  General Appearance: Casual  Eye Contact:  Good  Speech:  Clear and Coherent and Normal Rate  Volume:  Normal  Mood:  Euthymic  Affect:  Congruent  Thought Process:  Coherent and Descriptions of Associations: Intact  Orientation:  Full (Time, Place, and Person)  Thought Content:  WDL  Suicidal Thoughts:  States SI with no plan or intent  Homicidal Thoughts:  No  Memory:  Immediate;  Good Recent;   Good Remote;   Good  Judgement:  Fair  Insight:  Good  Psychomotor Activity:  Normal  Concentration:  Concentration: Good  Recall:  Good  Fund of Knowledge:  Good  Language:  Good  Akathisia:  No  Handed:  Right  AIMS (if indicated):     Assets:  Communication Skills Desire for Improvement Financial Resources/Insurance Housing Physical Health Social Support Transportation  ADL's:  Intact  Cognition:  WNL  Sleep:        Treatment Plan Summary: return to group home   Continue home medications  Disposition: No evidence of imminent risk to self or others at present.   Patient does not meet criteria for psychiatric inpatient admission. Supportive therapy provided about ongoing stressors. Discussed crisis plan, support from social network, calling 911, coming to the Emergency Department, and calling Suicide Hotline.  This service was provided via telemedicine using a 2-way, interactive audio and video technology.  Names of all persons participating in this telemedicine service and their role in this encounter. Name: Lawrence Morgan Role: Patient  Name: Darnelle Maffucci  NP Role: Provider  Name: Lawrence Morgan (by phone) Role: group Home Director  Name: Lawrence Morgan (by phone) Role: father/ legal guardian    Lewis Shock, FNP 10/06/2018 1:13 PM

## 2018-11-09 DIAGNOSIS — Z131 Encounter for screening for diabetes mellitus: Secondary | ICD-10-CM | POA: Diagnosis not present

## 2018-11-09 DIAGNOSIS — K30 Functional dyspepsia: Secondary | ICD-10-CM | POA: Diagnosis not present

## 2018-11-09 DIAGNOSIS — Z13220 Encounter for screening for lipoid disorders: Secondary | ICD-10-CM | POA: Diagnosis not present

## 2018-11-09 DIAGNOSIS — J302 Other seasonal allergic rhinitis: Secondary | ICD-10-CM | POA: Diagnosis not present

## 2018-11-09 DIAGNOSIS — Z20828 Contact with and (suspected) exposure to other viral communicable diseases: Secondary | ICD-10-CM | POA: Diagnosis not present

## 2018-11-09 DIAGNOSIS — F200 Paranoid schizophrenia: Secondary | ICD-10-CM | POA: Diagnosis not present

## 2018-11-09 DIAGNOSIS — Z1159 Encounter for screening for other viral diseases: Secondary | ICD-10-CM | POA: Diagnosis not present

## 2018-11-09 DIAGNOSIS — K5909 Other constipation: Secondary | ICD-10-CM | POA: Diagnosis not present

## 2018-11-09 DIAGNOSIS — F17210 Nicotine dependence, cigarettes, uncomplicated: Secondary | ICD-10-CM | POA: Diagnosis not present

## 2018-11-16 DIAGNOSIS — R109 Unspecified abdominal pain: Secondary | ICD-10-CM | POA: Diagnosis not present

## 2018-11-16 DIAGNOSIS — Z888 Allergy status to other drugs, medicaments and biological substances status: Secondary | ICD-10-CM | POA: Diagnosis not present

## 2018-11-16 DIAGNOSIS — F17200 Nicotine dependence, unspecified, uncomplicated: Secondary | ICD-10-CM | POA: Diagnosis not present

## 2018-11-16 DIAGNOSIS — X780XXA Intentional self-harm by sharp glass, initial encounter: Secondary | ICD-10-CM | POA: Diagnosis not present

## 2018-11-16 DIAGNOSIS — F259 Schizoaffective disorder, unspecified: Secondary | ICD-10-CM | POA: Diagnosis not present

## 2018-11-16 DIAGNOSIS — T184XXA Foreign body in colon, initial encounter: Secondary | ICD-10-CM | POA: Diagnosis not present

## 2018-11-16 DIAGNOSIS — T189XXA Foreign body of alimentary tract, part unspecified, initial encounter: Secondary | ICD-10-CM | POA: Diagnosis not present

## 2018-11-16 DIAGNOSIS — F99 Mental disorder, not otherwise specified: Secondary | ICD-10-CM | POA: Diagnosis not present

## 2018-11-28 DIAGNOSIS — E119 Type 2 diabetes mellitus without complications: Secondary | ICD-10-CM | POA: Diagnosis not present

## 2018-11-28 DIAGNOSIS — R8290 Unspecified abnormal findings in urine: Secondary | ICD-10-CM | POA: Diagnosis not present

## 2018-11-28 DIAGNOSIS — R45851 Suicidal ideations: Secondary | ICD-10-CM | POA: Diagnosis not present

## 2018-11-28 DIAGNOSIS — T1491XA Suicide attempt, initial encounter: Secondary | ICD-10-CM | POA: Diagnosis not present

## 2018-11-28 DIAGNOSIS — F250 Schizoaffective disorder, bipolar type: Secondary | ICD-10-CM | POA: Diagnosis not present

## 2018-11-28 DIAGNOSIS — Z1159 Encounter for screening for other viral diseases: Secondary | ICD-10-CM | POA: Diagnosis not present

## 2018-11-28 DIAGNOSIS — T188XXA Foreign body in other parts of alimentary tract, initial encounter: Secondary | ICD-10-CM | POA: Diagnosis not present

## 2018-11-28 DIAGNOSIS — S50811A Abrasion of right forearm, initial encounter: Secondary | ICD-10-CM | POA: Diagnosis not present

## 2018-11-28 DIAGNOSIS — F259 Schizoaffective disorder, unspecified: Secondary | ICD-10-CM | POA: Diagnosis not present

## 2018-11-28 DIAGNOSIS — R9431 Abnormal electrocardiogram [ECG] [EKG]: Secondary | ICD-10-CM | POA: Diagnosis not present

## 2018-11-28 DIAGNOSIS — T184XXA Foreign body in colon, initial encounter: Secondary | ICD-10-CM | POA: Diagnosis not present

## 2018-11-28 DIAGNOSIS — Y999 Unspecified external cause status: Secondary | ICD-10-CM | POA: Diagnosis not present

## 2018-11-28 DIAGNOSIS — T189XXA Foreign body of alimentary tract, part unspecified, initial encounter: Secondary | ICD-10-CM | POA: Diagnosis not present

## 2018-11-28 DIAGNOSIS — F3189 Other bipolar disorder: Secondary | ICD-10-CM | POA: Diagnosis not present

## 2018-11-28 DIAGNOSIS — R000 Tachycardia, unspecified: Secondary | ICD-10-CM | POA: Diagnosis not present

## 2018-11-28 DIAGNOSIS — X788XXA Intentional self-harm by other sharp object, initial encounter: Secondary | ICD-10-CM | POA: Diagnosis not present

## 2018-11-28 DIAGNOSIS — T182XXA Foreign body in stomach, initial encounter: Secondary | ICD-10-CM | POA: Diagnosis not present

## 2018-11-28 DIAGNOSIS — R799 Abnormal finding of blood chemistry, unspecified: Secondary | ICD-10-CM | POA: Diagnosis not present

## 2018-11-28 DIAGNOSIS — R140 Abdominal distension (gaseous): Secondary | ICD-10-CM | POA: Diagnosis not present

## 2018-11-28 DIAGNOSIS — T185XXA Foreign body in anus and rectum, initial encounter: Secondary | ICD-10-CM | POA: Diagnosis not present

## 2018-11-29 DIAGNOSIS — F250 Schizoaffective disorder, bipolar type: Secondary | ICD-10-CM | POA: Diagnosis not present

## 2018-11-29 DIAGNOSIS — R000 Tachycardia, unspecified: Secondary | ICD-10-CM | POA: Diagnosis not present

## 2018-11-29 DIAGNOSIS — T188XXA Foreign body in other parts of alimentary tract, initial encounter: Secondary | ICD-10-CM | POA: Diagnosis not present

## 2018-11-30 DIAGNOSIS — F250 Schizoaffective disorder, bipolar type: Secondary | ICD-10-CM | POA: Diagnosis not present

## 2018-11-30 DIAGNOSIS — T189XXA Foreign body of alimentary tract, part unspecified, initial encounter: Secondary | ICD-10-CM | POA: Diagnosis not present

## 2018-11-30 DIAGNOSIS — T182XXA Foreign body in stomach, initial encounter: Secondary | ICD-10-CM | POA: Diagnosis not present

## 2018-12-02 DIAGNOSIS — T182XXA Foreign body in stomach, initial encounter: Secondary | ICD-10-CM | POA: Diagnosis not present

## 2018-12-02 DIAGNOSIS — T189XXA Foreign body of alimentary tract, part unspecified, initial encounter: Secondary | ICD-10-CM | POA: Diagnosis not present

## 2018-12-03 DIAGNOSIS — R9431 Abnormal electrocardiogram [ECG] [EKG]: Secondary | ICD-10-CM | POA: Diagnosis not present

## 2018-12-03 DIAGNOSIS — R140 Abdominal distension (gaseous): Secondary | ICD-10-CM | POA: Diagnosis not present

## 2018-12-03 DIAGNOSIS — T189XXA Foreign body of alimentary tract, part unspecified, initial encounter: Secondary | ICD-10-CM | POA: Diagnosis not present

## 2018-12-04 DIAGNOSIS — F250 Schizoaffective disorder, bipolar type: Secondary | ICD-10-CM | POA: Diagnosis not present

## 2018-12-05 DIAGNOSIS — F250 Schizoaffective disorder, bipolar type: Secondary | ICD-10-CM | POA: Diagnosis not present

## 2018-12-05 DIAGNOSIS — T189XXA Foreign body of alimentary tract, part unspecified, initial encounter: Secondary | ICD-10-CM | POA: Diagnosis not present

## 2018-12-05 DIAGNOSIS — R45851 Suicidal ideations: Secondary | ICD-10-CM | POA: Diagnosis not present

## 2018-12-06 DIAGNOSIS — T189XXA Foreign body of alimentary tract, part unspecified, initial encounter: Secondary | ICD-10-CM | POA: Diagnosis not present

## 2018-12-06 DIAGNOSIS — F250 Schizoaffective disorder, bipolar type: Secondary | ICD-10-CM | POA: Diagnosis not present

## 2018-12-09 DIAGNOSIS — F250 Schizoaffective disorder, bipolar type: Secondary | ICD-10-CM | POA: Diagnosis not present

## 2018-12-09 DIAGNOSIS — R45851 Suicidal ideations: Secondary | ICD-10-CM | POA: Diagnosis not present

## 2018-12-10 DIAGNOSIS — F250 Schizoaffective disorder, bipolar type: Secondary | ICD-10-CM | POA: Diagnosis not present

## 2018-12-10 DIAGNOSIS — T189XXA Foreign body of alimentary tract, part unspecified, initial encounter: Secondary | ICD-10-CM | POA: Diagnosis not present

## 2018-12-11 DIAGNOSIS — T188XXA Foreign body in other parts of alimentary tract, initial encounter: Secondary | ICD-10-CM | POA: Diagnosis not present

## 2018-12-11 DIAGNOSIS — F250 Schizoaffective disorder, bipolar type: Secondary | ICD-10-CM | POA: Diagnosis not present

## 2018-12-14 DIAGNOSIS — T188XXA Foreign body in other parts of alimentary tract, initial encounter: Secondary | ICD-10-CM | POA: Diagnosis not present

## 2018-12-14 DIAGNOSIS — F250 Schizoaffective disorder, bipolar type: Secondary | ICD-10-CM | POA: Diagnosis not present

## 2018-12-16 DIAGNOSIS — F250 Schizoaffective disorder, bipolar type: Secondary | ICD-10-CM | POA: Diagnosis not present

## 2018-12-16 DIAGNOSIS — T188XXA Foreign body in other parts of alimentary tract, initial encounter: Secondary | ICD-10-CM | POA: Diagnosis not present

## 2018-12-17 DIAGNOSIS — T188XXA Foreign body in other parts of alimentary tract, initial encounter: Secondary | ICD-10-CM | POA: Diagnosis not present

## 2018-12-17 DIAGNOSIS — T189XXA Foreign body of alimentary tract, part unspecified, initial encounter: Secondary | ICD-10-CM | POA: Diagnosis not present

## 2018-12-17 DIAGNOSIS — F250 Schizoaffective disorder, bipolar type: Secondary | ICD-10-CM | POA: Diagnosis not present

## 2018-12-18 DIAGNOSIS — T188XXA Foreign body in other parts of alimentary tract, initial encounter: Secondary | ICD-10-CM | POA: Diagnosis not present

## 2018-12-18 DIAGNOSIS — F250 Schizoaffective disorder, bipolar type: Secondary | ICD-10-CM | POA: Diagnosis not present

## 2018-12-19 DIAGNOSIS — T188XXA Foreign body in other parts of alimentary tract, initial encounter: Secondary | ICD-10-CM | POA: Diagnosis not present

## 2018-12-19 DIAGNOSIS — F250 Schizoaffective disorder, bipolar type: Secondary | ICD-10-CM | POA: Diagnosis not present

## 2018-12-20 DIAGNOSIS — T188XXA Foreign body in other parts of alimentary tract, initial encounter: Secondary | ICD-10-CM | POA: Diagnosis not present

## 2018-12-20 DIAGNOSIS — F250 Schizoaffective disorder, bipolar type: Secondary | ICD-10-CM | POA: Diagnosis not present

## 2018-12-21 DIAGNOSIS — T189XXA Foreign body of alimentary tract, part unspecified, initial encounter: Secondary | ICD-10-CM | POA: Diagnosis not present

## 2018-12-21 DIAGNOSIS — F250 Schizoaffective disorder, bipolar type: Secondary | ICD-10-CM | POA: Diagnosis not present

## 2018-12-21 DIAGNOSIS — T188XXA Foreign body in other parts of alimentary tract, initial encounter: Secondary | ICD-10-CM | POA: Diagnosis not present

## 2018-12-22 DIAGNOSIS — T188XXA Foreign body in other parts of alimentary tract, initial encounter: Secondary | ICD-10-CM | POA: Diagnosis not present

## 2018-12-22 DIAGNOSIS — F250 Schizoaffective disorder, bipolar type: Secondary | ICD-10-CM | POA: Diagnosis not present

## 2018-12-23 DIAGNOSIS — F250 Schizoaffective disorder, bipolar type: Secondary | ICD-10-CM | POA: Diagnosis not present

## 2018-12-23 DIAGNOSIS — T188XXA Foreign body in other parts of alimentary tract, initial encounter: Secondary | ICD-10-CM | POA: Diagnosis not present

## 2018-12-24 DIAGNOSIS — F250 Schizoaffective disorder, bipolar type: Secondary | ICD-10-CM | POA: Diagnosis not present

## 2018-12-24 DIAGNOSIS — T188XXA Foreign body in other parts of alimentary tract, initial encounter: Secondary | ICD-10-CM | POA: Diagnosis not present

## 2018-12-25 DIAGNOSIS — T188XXA Foreign body in other parts of alimentary tract, initial encounter: Secondary | ICD-10-CM | POA: Diagnosis not present

## 2018-12-25 DIAGNOSIS — F250 Schizoaffective disorder, bipolar type: Secondary | ICD-10-CM | POA: Diagnosis not present

## 2018-12-26 DIAGNOSIS — F250 Schizoaffective disorder, bipolar type: Secondary | ICD-10-CM | POA: Diagnosis not present

## 2018-12-26 DIAGNOSIS — T188XXA Foreign body in other parts of alimentary tract, initial encounter: Secondary | ICD-10-CM | POA: Diagnosis not present

## 2018-12-27 DIAGNOSIS — F250 Schizoaffective disorder, bipolar type: Secondary | ICD-10-CM | POA: Diagnosis not present

## 2018-12-27 DIAGNOSIS — T188XXA Foreign body in other parts of alimentary tract, initial encounter: Secondary | ICD-10-CM | POA: Diagnosis not present

## 2018-12-28 DIAGNOSIS — T185XXA Foreign body in anus and rectum, initial encounter: Secondary | ICD-10-CM | POA: Diagnosis not present

## 2018-12-28 DIAGNOSIS — T182XXA Foreign body in stomach, initial encounter: Secondary | ICD-10-CM | POA: Diagnosis not present

## 2018-12-28 DIAGNOSIS — T188XXA Foreign body in other parts of alimentary tract, initial encounter: Secondary | ICD-10-CM | POA: Diagnosis not present

## 2018-12-28 DIAGNOSIS — F250 Schizoaffective disorder, bipolar type: Secondary | ICD-10-CM | POA: Diagnosis not present

## 2018-12-29 DIAGNOSIS — F250 Schizoaffective disorder, bipolar type: Secondary | ICD-10-CM | POA: Diagnosis not present

## 2018-12-29 DIAGNOSIS — T188XXA Foreign body in other parts of alimentary tract, initial encounter: Secondary | ICD-10-CM | POA: Diagnosis not present

## 2018-12-30 DIAGNOSIS — F250 Schizoaffective disorder, bipolar type: Secondary | ICD-10-CM | POA: Diagnosis not present

## 2018-12-30 DIAGNOSIS — T189XXA Foreign body of alimentary tract, part unspecified, initial encounter: Secondary | ICD-10-CM | POA: Diagnosis not present

## 2018-12-30 DIAGNOSIS — F3189 Other bipolar disorder: Secondary | ICD-10-CM | POA: Diagnosis not present

## 2018-12-30 DIAGNOSIS — Z599 Problem related to housing and economic circumstances, unspecified: Secondary | ICD-10-CM | POA: Diagnosis not present

## 2018-12-30 DIAGNOSIS — T189XXD Foreign body of alimentary tract, part unspecified, subsequent encounter: Secondary | ICD-10-CM | POA: Diagnosis not present

## 2018-12-30 DIAGNOSIS — T1491XA Suicide attempt, initial encounter: Secondary | ICD-10-CM | POA: Diagnosis not present

## 2018-12-30 DIAGNOSIS — Z609 Problem related to social environment, unspecified: Secondary | ICD-10-CM | POA: Diagnosis not present

## 2018-12-30 DIAGNOSIS — F259 Schizoaffective disorder, unspecified: Secondary | ICD-10-CM | POA: Diagnosis not present

## 2018-12-30 DIAGNOSIS — S50811A Abrasion of right forearm, initial encounter: Secondary | ICD-10-CM | POA: Diagnosis not present

## 2019-06-22 DIAGNOSIS — R5383 Other fatigue: Secondary | ICD-10-CM | POA: Diagnosis not present

## 2019-06-22 DIAGNOSIS — G4700 Insomnia, unspecified: Secondary | ICD-10-CM | POA: Diagnosis not present

## 2019-06-22 DIAGNOSIS — E785 Hyperlipidemia, unspecified: Secondary | ICD-10-CM | POA: Diagnosis not present

## 2019-06-22 DIAGNOSIS — K219 Gastro-esophageal reflux disease without esophagitis: Secondary | ICD-10-CM | POA: Diagnosis not present

## 2019-06-22 DIAGNOSIS — F250 Schizoaffective disorder, bipolar type: Secondary | ICD-10-CM | POA: Diagnosis not present

## 2019-06-22 DIAGNOSIS — K5900 Constipation, unspecified: Secondary | ICD-10-CM | POA: Diagnosis not present

## 2019-07-04 ENCOUNTER — Ambulatory Visit: Payer: Medicare Other | Attending: Internal Medicine

## 2019-07-04 DIAGNOSIS — Z20822 Contact with and (suspected) exposure to covid-19: Secondary | ICD-10-CM | POA: Diagnosis not present

## 2019-07-05 LAB — NOVEL CORONAVIRUS, NAA: SARS-CoV-2, NAA: NOT DETECTED

## 2019-07-09 DIAGNOSIS — F259 Schizoaffective disorder, unspecified: Secondary | ICD-10-CM | POA: Diagnosis not present

## 2019-07-11 DIAGNOSIS — Z79899 Other long term (current) drug therapy: Secondary | ICD-10-CM | POA: Diagnosis not present

## 2019-07-11 DIAGNOSIS — F259 Schizoaffective disorder, unspecified: Secondary | ICD-10-CM | POA: Diagnosis not present

## 2019-09-04 DIAGNOSIS — G4700 Insomnia, unspecified: Secondary | ICD-10-CM | POA: Diagnosis not present

## 2019-09-04 DIAGNOSIS — F259 Schizoaffective disorder, unspecified: Secondary | ICD-10-CM | POA: Diagnosis not present

## 2019-09-04 DIAGNOSIS — K5900 Constipation, unspecified: Secondary | ICD-10-CM | POA: Diagnosis not present

## 2019-09-04 DIAGNOSIS — K219 Gastro-esophageal reflux disease without esophagitis: Secondary | ICD-10-CM | POA: Diagnosis not present

## 2019-09-08 ENCOUNTER — Emergency Department: Payer: Medicare Other

## 2019-09-08 ENCOUNTER — Other Ambulatory Visit: Payer: Self-pay

## 2019-09-08 ENCOUNTER — Emergency Department
Admission: EM | Admit: 2019-09-08 | Discharge: 2019-09-08 | Disposition: A | Discharge status: Home / Self Care | Payer: Medicare Other | Attending: Student in an Organized Health Care Education/Training Program | Admitting: Student in an Organized Health Care Education/Training Program

## 2019-09-08 DIAGNOSIS — T1491XA Suicide attempt, initial encounter: Secondary | ICD-10-CM | POA: Diagnosis not present

## 2019-09-08 DIAGNOSIS — Y939 Activity, unspecified: Secondary | ICD-10-CM | POA: Insufficient documentation

## 2019-09-08 DIAGNOSIS — X58XXXA Exposure to other specified factors, initial encounter: Secondary | ICD-10-CM | POA: Diagnosis not present

## 2019-09-08 DIAGNOSIS — F4310 Post-traumatic stress disorder, unspecified: Secondary | ICD-10-CM | POA: Diagnosis present

## 2019-09-08 DIAGNOSIS — Y929 Unspecified place or not applicable: Secondary | ICD-10-CM | POA: Diagnosis not present

## 2019-09-08 DIAGNOSIS — Z79899 Other long term (current) drug therapy: Secondary | ICD-10-CM | POA: Diagnosis not present

## 2019-09-08 DIAGNOSIS — Y999 Unspecified external cause status: Secondary | ICD-10-CM | POA: Diagnosis not present

## 2019-09-08 DIAGNOSIS — T189XXA Foreign body of alimentary tract, part unspecified, initial encounter: Secondary | ICD-10-CM | POA: Diagnosis not present

## 2019-09-08 DIAGNOSIS — T189XXD Foreign body of alimentary tract, part unspecified, subsequent encounter: Secondary | ICD-10-CM

## 2019-09-08 DIAGNOSIS — F250 Schizoaffective disorder, bipolar type: Secondary | ICD-10-CM | POA: Diagnosis not present

## 2019-09-08 DIAGNOSIS — F79 Unspecified intellectual disabilities: Secondary | ICD-10-CM | POA: Diagnosis not present

## 2019-09-08 DIAGNOSIS — F17210 Nicotine dependence, cigarettes, uncomplicated: Secondary | ICD-10-CM | POA: Insufficient documentation

## 2019-09-08 DIAGNOSIS — Z046 Encounter for general psychiatric examination, requested by authority: Secondary | ICD-10-CM | POA: Diagnosis present

## 2019-09-08 LAB — URINE DRUG SCREEN, QUALITATIVE (ARMC ONLY)
Amphetamines, Ur Screen: NOT DETECTED
Barbiturates, Ur Screen: NOT DETECTED
Benzodiazepine, Ur Scrn: NOT DETECTED
Cannabinoid 50 Ng, Ur ~~LOC~~: NOT DETECTED
Cocaine Metabolite,Ur ~~LOC~~: NOT DETECTED
MDMA (Ecstasy)Ur Screen: NOT DETECTED
Methadone Scn, Ur: NOT DETECTED
Opiate, Ur Screen: NOT DETECTED
Phencyclidine (PCP) Ur S: NOT DETECTED
Tricyclic, Ur Screen: NOT DETECTED

## 2019-09-08 LAB — COMPREHENSIVE METABOLIC PANEL
ALT: 13 U/L (ref 0–44)
AST: 24 U/L (ref 15–41)
Albumin: 4.7 g/dL (ref 3.5–5.0)
Alkaline Phosphatase: 74 U/L (ref 38–126)
Anion gap: 7 (ref 5–15)
BUN: 10 mg/dL (ref 6–20)
CO2: 22 mmol/L (ref 22–32)
Calcium: 9.3 mg/dL (ref 8.9–10.3)
Chloride: 106 mmol/L (ref 98–111)
Creatinine, Ser: 0.96 mg/dL (ref 0.61–1.24)
GFR calc Af Amer: >60 mL/min (ref >60)
GFR calc non Af Amer: >60 mL/min (ref >60)
Glucose, Bld: 104 mg/dL — ABNORMAL HIGH (ref 70–99)
Potassium: 3.7 mmol/L (ref 3.5–5.1)
Sodium: 135 mmol/L (ref 135–145)
Total Bilirubin: 0.5 mg/dL (ref 0.3–1.2)
Total Protein: 7.6 g/dL (ref 6.5–8.1)

## 2019-09-08 LAB — CBC
HCT: 42.9 % (ref 39.0–52.0)
Hemoglobin: 13.4 g/dL (ref 13.0–17.0)
MCH: 27.2 pg (ref 26.0–34.0)
MCHC: 31.2 g/dL (ref 30.0–36.0)
MCV: 87 fL (ref 80.0–100.0)
Platelets: 229 10*3/uL (ref 150–400)
RBC: 4.93 MIL/uL (ref 4.22–5.81)
RDW: 13.5 % (ref 11.5–15.5)
WBC: 10.3 10*3/uL (ref 4.0–10.5)
nRBC: 0 % (ref 0.0–0.2)

## 2019-09-08 LAB — ACETAMINOPHEN LEVEL: Acetaminophen (Tylenol), Serum: <10 ug/mL — ABNORMAL LOW (ref 10–30)

## 2019-09-08 LAB — ETHANOL: Alcohol, Ethyl (B): <10 mg/dL (ref <10)

## 2019-09-08 LAB — SALICYLATE LEVEL: Salicylate Lvl: <7 mg/dL — ABNORMAL LOW (ref 7.0–30.0)

## 2019-09-08 MED ORDER — ACETAMINOPHEN 500 MG PO TABS
1000.0000 mg | ORAL_TABLET | Freq: Four times a day (QID) | ORAL | Status: DC | PRN
  Administered 2019-09-08: 1000 mg via ORAL
  Filled 2019-09-08: qty 2

## 2019-09-08 MED ORDER — ONDANSETRON 4 MG PO TBDP
4.0000 mg | ORAL_TABLET | Freq: Once | ORAL | Status: AC
  Administered 2019-09-08: 06:00:00 4 mg via ORAL
  Filled 2019-09-08: qty 1

## 2019-09-08 MED ORDER — ONDANSETRON 4 MG PO TBDP
4.0000 mg | ORAL_TABLET | Freq: Once | ORAL | Status: AC
  Administered 2019-09-08: 09:00:00 4 mg via ORAL
  Filled 2019-09-08: qty 1

## 2019-09-08 NOTE — BH Assessment (Signed)
Patient unavailable to be assessed. Patient went to xray and is not medically cleared yet

## 2019-09-08 NOTE — ED Notes (Signed)
Transported to xray 

## 2019-09-08 NOTE — ED Notes (Signed)
Patient taken to imaging with security as escort.

## 2019-09-08 NOTE — ED Triage Notes (Signed)
Patient reports history of depression, manic-depressive and schizoaffective and autistic.  Patient stating "I need some help".

## 2019-09-08 NOTE — ED Notes (Signed)
Patient's father Tawni Carnes was contacted at [phone] that the patient is being discharged. Guardian is agreeable to the discharge and had no questions.

## 2019-09-08 NOTE — ED Notes (Signed)
Patient informed EDP that he swallowed a handful of pennies earlier

## 2019-09-08 NOTE — ED Notes (Signed)
Pt c/o of nausea and increasing left should pain. Per pt left shoulder pain is chronic. MD made aware. No new orders at this time.

## 2019-09-08 NOTE — ED Notes (Signed)
Pt c/o chronic right shoulder pain. Denies abd pain at this time. Pt asleep but woken up for assessment.

## 2019-09-08 NOTE — ED Provider Notes (Addendum)
Patient reassessed.  Repeat abdominal exam soft and benign.  He is complaining of some chronic right shoulder pain will order x-ray to ensure there is no evidence of fracture.  Neurovascular intact distally.  On general exam does not have any evidence of lesion.  States that he inserted a staple into his penis several years ago which would correspond with the metallic object on x-ray.  There is no evidence of laceration pain or erythema.   Willy Eddy, MD 09/08/19 [phone]   Repeat KUB does show progression of the ingested coins.  Patient asymptomatic.  Has been cleared by psychiatry.  Discussed case with GI.  No indication for hospitalization or further monitoring.  Highly likely to pass without complication.  Encouraged to return to the ER if he develops any abdominal pain, nausea or vomiting.    Willy Eddy, MD 09/08/19 [phone]

## 2019-09-08 NOTE — ED Notes (Signed)
Patient assisted to restroom.  

## 2019-09-08 NOTE — BHH Counselor (Signed)
Psych consult completed. Pt denied SI/HI and hallucinations.   Per [employer], Pt will be discharged with the recommendation to follow up with resources provided at group home.

## 2019-09-08 NOTE — Consult Note (Signed)
[facility] Face-to-Face Psychiatry Consult   Reason for Consult:  Schizoaffective disorder and ingestion of coins Referring Physician:  EDP Patient Identification: Lawrence Morgan MRN:  [id number] Principal Diagnosis: Schizoaffective disorder, bipolar type (HCC) Diagnosis:  Principal Problem:   Schizoaffective disorder, bipolar type (HCC) Active Problems:   PTSD (post-traumatic stress disorder)   Ingestion of foreign body, subsequent encounter   Total Time spent with patient: 30 minutes  Subjective:   Lawrence Morgan is a 39 y.o. male patient reports that he came to the hospital because he had swallowed some pennies and he had told him that he wanted to hurt himself.  Patient reports that he did this because he is "dumb as fuck" and that if he does not do something drastic he does not get his father's attention.  He states that he is in a group home and his father never visits him.  He reports that his father will call him from time to time but that is about it.  He states his father does not pay many attention unless it is something drastic.  He also states that he was at [facility] recently in January and that he wants to be sent to brought in because he can stay there a lot longer.  Patient denies any suicidal homicidal ideations currently.  Patient does admit to swallowing cocaine yesterday and then also states "there should be some rocks in there too."  Patient states that he has done this in the past and he has also swallowed batteries.  This was confirmed in his chart for approximately 6 months ago.  Patient was asked why he swallowed batteries and patient reported that he felt that his "juice" was getting low and so he swallowed the batteries to help increase his "juice."  Patient's father and legal guardian Lawrence Morgan was contacted at [phone] for collateral information.  He reports that this is a normal behavior for the patient when he is not getting his way.  He states that he has been  to multiple locations and he has been admitted in the past.  He states that he does feel comfortable with the patient being discharged that this is been normal behavior for him in the past.  He also reports that the patient will have to return to the group home because he has nowhere that he can go at the moment.  He states that he would not be able to pick the patient up and that we will need to contact his group home.  [facility] group home was contacted and they reported that they would not be able to take him back because of him swallowing things.  They were informed that this was known in his history and that they would have to take it up with his guardian.  They stated that they would pick him up and just need to know what time they would be here to get him.  They agreed to pick the patient up around noon.  HPI:  Per EDP: 39 y.o. male with a history of intellectual disability, personality disorder, schizoaffective disorder, PTSD who presents to the hospital for psychiatric evaluation.  Patient reports that since leaving [facility] in January has been getting progressively worse with his mental health.  Has not seen a psychiatrist.  Has not been taking his medications.  He reports swallowing $1 worth of pennies this evening in a suicide attempt.  Patient denies any recent drug use.  Patient reports feeling suicidal and wishes he had  a gun to shoot himself in the head.  Patient presents in his room and is calm, cooperative, and pleasant.  Patient does detail a story of being upset and feeling that he is not smart enough to take care of himself.  This is proven by the patient have a legal guardian and living in a group home.  Patient does have a history of intellectual disability, schizoaffective disorder, PTSD, and personality disorder.  It is well documented and reported by the guardian that the patient has a history of swallowing foreign objects when he is not getting his way.  The patient has  been in the ED multiple times in the past and admissions.  At this time the patient has remained calm and cooperative and the group home is willing to take him back as well as a legal guardian aware of his actions and stating that he feels safe for the patient discharging back to the group home.  Patient has denied having any current suicidal homicidal ideations and only reporting that he wants the attention of his father.  At this time the patient does not meet any psychiatric inpatient treatment criteria and is psychiatric cleared.  I have rescinded the patient's IVC and notified Dr. Roxan Hockeyobinson of the recommendations.  Past Psychiatric History: History of schizoaffective disorder.  Numerous ED visits as well as hospitalizations in the past.  Patient has a known and documented history of swallowing foreign objects  Risk to Self:   Risk to Others:   Prior Inpatient Therapy:   Prior Outpatient Therapy:    Past Medical History:  Past Medical History:  Diagnosis Date  . Intellectual disability    mild  . Marijuana use   . Personality disorder (HCC)   . PTSD (post-traumatic stress disorder)   . 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 Schizoaffective disorder, bipolar type [facility](HCC)     Past Surgical History:  Procedure Laterality Date  . ESOPHAGOGASTRODUODENOSCOPY (EGD) WITH PROPOFOL N/A 04/30/2018   Procedure: ESOPHAGOGASTRODUODENOSCOPY (EGD) WITH PROPOFOL;  Surgeon: Kathi DerBrahmbhatt, Parag, MD;  Location: [facility] ENDOSCOPY;  Service: Gastroenterology;  Laterality: N/A;  . FOREIGN BODY REMOVAL N/A 04/30/2018   Procedure: FOREIGN BODY REMOVAL;  Surgeon: Kathi DerBrahmbhatt, Parag, MD;  Location: [facility] ENDOSCOPY;  Service: Gastroenterology;  Laterality: N/A;  . FRACTURE SURGERY      r ankle    Family History: No family history on file. Family Psychiatric  History: None reported Social History:  Social History   Substance and Sexual Activity  Alcohol Use Not Currently     Social History   Substance and  Sexual Activity  Drug Use Yes  . Frequency: 3.0 times per week  . Types: Marijuana    Social History   Socioeconomic History  . Marital status: Single    Spouse name: Not on file  . Number of children: Not on file  . Years of education: Not on file  . Highest education level: Not on file  Occupational History  . Not on file  Tobacco Use  . Smoking status: Current Every Day Smoker    Types: Cigars, Cigarettes  . Smokeless tobacco: Never Used  Substance and Sexual Activity  . Alcohol use: Not Currently  . Drug use: Yes    Frequency: 3.0 times per week    Types: Marijuana  . Sexual activity: Not Currently  Other Topics Concern  . Not on file  Social History Narrative  . Not on file   Social Determinants of Health   Financial Resource Strain:   .  Difficulty of Paying Living Expenses:   Food Insecurity:   . Worried About Programme researcher, broadcasting/film/video in the Last Year:   . Barista in the Last Year:   Transportation Needs:   . Freight forwarder (Medical):   Marland Kitchen Lack of Transportation (Non-Medical):   Physical Activity:   . Days of Exercise per Week:   . Minutes of Exercise per Session:   Stress:   . Feeling of Stress :   Social Connections:   . Frequency of Communication with Friends and Family:   . Frequency of Social Gatherings with Friends and Family:   . Attends Religious Services:   . Active Member of Clubs or Organizations:   . Attends Banker Meetings:   Marland Kitchen Marital Status:    Additional Social History:    Allergies:   Allergies  Allergen Reactions  . Haldol [Haloperidol Lactate]   . Shellfish Allergy Itching and Rash    Labs:  Results for orders placed or performed during the hospital encounter of 09/08/19 (from the past 48 hour(s))  Comprehensive metabolic panel     Status: Abnormal   Collection Time: 09/08/19  4:32 AM  Result Value Ref Range   Sodium 135 135 - 145 mmol/L   Potassium 3.7 3.5 - 5.1 mmol/L   Chloride 106 98 - 111  mmol/L   CO2 22 22 - 32 mmol/L   Glucose, Bld 104 (H) 70 - 99 mg/dL    Comment: Glucose reference range applies only to samples taken after fasting for at least 8 hours.   BUN 10 6 - 20 mg/dL   Creatinine, Ser 1.61 0.61 - 1.24 mg/dL   Calcium 9.3 8.9 - 09.6 mg/dL   Total Protein 7.6 6.5 - 8.1 g/dL   Albumin 4.7 3.5 - 5.0 g/dL   AST 24 15 - 41 U/L   ALT 13 0 - 44 U/L   Alkaline Phosphatase 74 38 - 126 U/L   Total Bilirubin 0.5 0.3 - 1.2 mg/dL   GFR calc non Af Amer >60 >60 mL/min   GFR calc Af Amer >60 >60 mL/min   Anion gap 7 5 - 15    Comment: Performed at [facility], [address], [place], [place] [address]  Ethanol     Status: None   Collection Time: 09/08/19  4:32 AM  Result Value Ref Range   Alcohol, Ethyl (B) <10 <10 mg/dL    Comment: (NOTE) Lowest detectable limit for serum alcohol is 10 mg/dL. For medical purposes only. Performed at [facility], [address] [place], [place] [address]   Salicylate level     Status: Abnormal   Collection Time: 09/08/19  4:32 AM  Result Value Ref Range   Salicylate Lvl <7.0 (L) 7.0 - 30.0 mg/dL    Comment: Performed at [facility], [address] [place], [place] [address]  Acetaminophen level     Status: Abnormal   Collection Time: 09/08/19  4:32 AM  Result Value Ref Range   Acetaminophen (Tylenol), Serum <10 (L) 10 - 30 ug/mL    Comment: (NOTE) Therapeutic concentrations vary significantly. A range of 10-30 ug/mL  may be an effective concentration for many patients. However, some  are best treated at concentrations outside of this range. Acetaminophen concentrations >150 ug/mL at 4 hours after ingestion  and >50 ug/mL at 12 hours after ingestion are often associated with  toxic reactions. Performed at [facility], [address] [place],  [place] [address]   cbc     Status: None   Collection Time: 09/08/19  4:32 AM  Result Value Ref Range   WBC 10.3 4.0 - 10.5 K/uL   RBC 4.93  4.22 - 5.81 MIL/uL   Hemoglobin 13.4 13.0 - 17.0 g/dL   HCT 42.9 39.0 - 52.0 %   MCV 87.0 80.0 - 100.0 fL   MCH 27.2 26.0 - 34.0 pg   MCHC 31.2 30.0 - 36.0 g/dL   RDW 13.5 11.5 - 15.5 %   Platelets 229 150 - 400 K/uL   nRBC 0.0 0.0 - 0.2 %    Comment: Performed at [facility], [address] [place], [place] [address]  Urine Drug Screen, Qualitative     Status: None   Collection Time: 09/08/19  4:32 AM  Result Value Ref Range   Tricyclic, Ur Screen NONE DETECTED NONE DETECTED   Amphetamines, Ur Screen NONE DETECTED NONE DETECTED   MDMA (Ecstasy)Ur Screen NONE DETECTED NONE DETECTED   Cocaine Metabolite,Ur [place] NONE DETECTED NONE DETECTED   Opiate, Ur Screen NONE DETECTED NONE DETECTED   Phencyclidine (PCP) Ur S NONE DETECTED NONE DETECTED   Cannabinoid 50 Ng, Ur [place] NONE DETECTED NONE DETECTED   Barbiturates, Ur Screen NONE DETECTED NONE DETECTED   Benzodiazepine, Ur Scrn NONE DETECTED NONE DETECTED   Methadone Scn, Ur NONE DETECTED NONE DETECTED    Comment: (NOTE) Tricyclics + metabolites, urine    Cutoff 1000 ng/mL Amphetamines + metabolites, urine  Cutoff 1000 ng/mL MDMA (Ecstasy), urine              Cutoff 500 ng/mL Cocaine Metabolite, urine          Cutoff 300 ng/mL Opiate + metabolites, urine        Cutoff 300 ng/mL Phencyclidine (PCP), urine         Cutoff 25 ng/mL Cannabinoid, urine                 Cutoff 50 ng/mL Barbiturates + metabolites, urine  Cutoff 200 ng/mL Benzodiazepine, urine              Cutoff 200 ng/mL Methadone, urine                   Cutoff 300 ng/mL The urine drug screen provides only a preliminary, unconfirmed analytical test result and should not be used for non-medical purposes. Clinical consideration and professional judgment should be applied to any positive drug screen result due to possible interfering substances. A more specific alternate chemical method must be used in order to obtain a confirmed analytical result. Gas chromatography  / mass spectrometry (GC/MS) is the preferred confirmat ory method. Performed at [facility], [address] [place],  [address]     Current Facility-Administered Medications  Medication Dose Route Frequency Provider Last Rate Last Admin  . acetaminophen (TYLENOL) tablet 1,000 mg  1,000 mg Oral Q6H PRN Merlyn Lot, MD      . ondansetron (ZOFRAN-ODT) disintegrating tablet 4 mg  4 mg Oral Once Merlyn Lot, MD       Current Outpatient Medications  Medication Sig Dispense Refill  . albuterol (PROVENTIL HFA;VENTOLIN HFA) 108 (90 Base) MCG/ACT inhaler Inhale 2 puffs into the lungs every 6 (six) hours as needed for wheezing or shortness of breath.    Marland Kitchen atomoxetine (STRATTERA) 25 MG capsule Take 25 mg by mouth daily.    . benztropine (COGENTIN) 0.5 MG tablet Take 0.5 mg by mouth daily.    Marland Kitchen  cetirizine (ZYRTEC) 10 MG tablet Take 10 mg by mouth daily.    . Cholecalciferol (VITAMIN D3) 50 MCG (2000 UT) capsule Take 2,000 Units by mouth daily.    . cloZAPine (CLOZARIL) 100 MG tablet Take 2.5 tablets (250 mg total) by mouth at bedtime. 90 tablet 0  . Docusate Sodium 100 MG capsule Take 200 mg by mouth 2 (two) times daily.    . hydrOXYzine (ATARAX/VISTARIL) 25 MG tablet Take 1 tablet (25 mg total) by mouth 3 (three) times daily. 90 tablet 0  . lithium carbonate (LITHOBID) 300 MG CR tablet Take 1 tablet (300 mg total) by mouth daily with breakfast. 30 tablet 0  . lithium carbonate (LITHOBID) 300 MG CR tablet Take 2 tablets (600 mg total) by mouth at bedtime. 60 tablet 0  . omeprazole (PRILOSEC) 20 MG capsule Take 20 mg by mouth 2 (two) times daily before a meal.    . paliperidone (INVEGA SUSTENNA) 234 MG/1.5ML SUSY injection Inject 234 mg into the muscle every 30 (thirty) days.    . polyethylene glycol (MIRALAX / GLYCOLAX) packet Take 17 g by mouth daily.    . sertraline (ZOLOFT) 100 MG tablet Take 100 mg by mouth daily.      Musculoskeletal: Strength & Muscle Tone:  within normal limits Gait & Station: Remained in bed during evaluation Patient leans: N/A  Psychiatric Specialty Exam: Physical Exam  Nursing note and vitals reviewed. Constitutional: He is oriented to person, place, and time. He appears well-developed and well-nourished.  Cardiovascular: Normal rate.  Respiratory: Effort normal.  Musculoskeletal:        General: Normal range of motion.  Neurological: He is alert and oriented to person, place, and time.  Skin: Skin is warm.    Review of Systems  Constitutional: Negative.   HENT: Negative.   Eyes: Negative.   Respiratory: Negative.   Cardiovascular: Negative.   Gastrointestinal: Negative.   Genitourinary: Negative.   Musculoskeletal: Negative.   Skin: Negative.   Neurological: Negative.   Psychiatric/Behavioral: Positive for behavioral problems.    Blood pressure (!) 113/93, pulse 89, temperature 98.6 F (37 C), temperature source Oral, resp. rate 18, height 5\' 10"  (1.778 m), weight 94.8 kg, SpO2 98 %.Body mass index is 29.99 kg/m.  General Appearance: Casual  Eye Contact:  Fair  Speech:  Clear and Coherent and Normal Rate  Volume:  Increased  Mood:  Anxious  Affect:  Congruent  Thought Process:  Coherent and Descriptions of Associations: Intact  Orientation:  Full (Time, Place, and Person)  Thought Content:  WDL  Suicidal Thoughts:  No  Homicidal Thoughts:  No  Memory:  Immediate;   Fair Recent;   Fair Remote;   Fair  Judgement:  Impaired  Insight:  Lacking  Psychomotor Activity:  Normal  Concentration:  Concentration: Fair  Recall:  of Knowledge:  Fair  Language:  Fair  Akathisia:  No  Handed:  Right  AIMS (if indicated):     Assets:  [employer]  ADL's:  Impaired  Cognition:  Impaired,  Mild  Sleep:        Treatment Plan Summary: Continue current medications  Return to group home  Disposition: No evidence of imminent risk to self  or others at present.   Patient does not meet criteria for psychiatric inpatient admission. Discussed crisis plan, support from social network, calling 911, coming to the Emergency Department, and calling Suicide Hotline.  Teacher, early years/pre , FNP 09/08/2019 10:12 AM

## 2019-09-08 NOTE — ED Notes (Signed)
ED Provider at bedside. 

## 2019-09-08 NOTE — ED Notes (Signed)
Attempted to call legal guardian, [name], voice mail box full so unable to leave a message.

## 2019-09-08 NOTE — ED Notes (Signed)
Pt calling out asking for Maalox. MD made aware and as RN went to update patient pt leaned over bed railing and vomited multiple times. MD aware of vomiting as well.

## 2019-09-08 NOTE — ED Provider Notes (Signed)
[facility] Emergency Department Provider Note  ____________________________________________  Time seen: Approximately 5:34 AM  I have reviewed the triage vital signs and the nursing notes.   HISTORY  Chief Complaint Psychiatric Evaluation   HPI Lawrence Morgan is a 39 y.o. male with a history of intellectual disability, personality disorder, schizoaffective disorder, PTSD  who presents to the hospital for psychiatric evaluation.  [facility] reports that since [facility] has been getting progressively worse with his mental health.  Has not seen a psychiatrist.  Has not been taking his medications.  He reports swallowing $1 worth of pennies this evening in a suicide attempt.  Patient denies any recent drug use.  Patient reports feeling suicidal and wishes he had a gun to shoot himself in the head.  Past Medical History:  Diagnosis Date  . Intellectual disability    mild  . Marijuana use   . Personality disorder (HCC)   . PTSD (post-traumatic stress disorder)   . 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 Schizoaffective disorder, bipolar type [facility])     Patient Active Problem List   Diagnosis Date Noted  . Ingestion of foreign body, initial encounter 04/29/2018  . Suicide ideation 04/29/2018  . Schizoaffective disorder, bipolar type (HCC)   . PTSD (post-traumatic stress disorder)     Past Surgical History:  Procedure Laterality Date  . ESOPHAGOGASTRODUODENOSCOPY (EGD) WITH PROPOFOL N/A 04/30/2018   Procedure: ESOPHAGOGASTRODUODENOSCOPY (EGD) WITH PROPOFOL;  Surgeon: Kathi Der, MD;  Location: [facility] ENDOSCOPY;  Service: Gastroenterology;  Laterality: N/A;  . FOREIGN BODY REMOVAL N/A 04/30/2018   Procedure: FOREIGN BODY REMOVAL;  Surgeon: Kathi Der, MD;  Location: [facility] ENDOSCOPY;  Service: Gastroenterology;  Laterality: N/A;  . FRACTURE SURGERY      r ankle     Prior to Admission medications   Medication Sig  Start Date End Date Taking? Authorizing Provider  albuterol (PROVENTIL HFA;VENTOLIN HFA) 108 (90 Base) MCG/ACT inhaler Inhale 2 puffs into the lungs every 6 (six) hours as needed for wheezing or shortness of breath.    [provider]  atomoxetine (STRATTERA) 25 MG capsule Take 25 mg by mouth daily.    [provider]  benztropine (COGENTIN) 0.5 MG tablet Take 0.5 mg by mouth daily.    [provider]  cetirizine (ZYRTEC) 10 MG tablet Take 10 mg by mouth daily.    [provider]  Cholecalciferol (VITAMIN D3) 50 MCG (2000 UT) capsule Take 2,000 Units by mouth daily.    [provider]  cloZAPine (CLOZARIL) 100 MG tablet Take 2.5 tablets (250 mg total) by mouth at bedtime. 05/10/18   Laverna Peace, MD  Docusate Sodium 100 MG capsule Take 200 mg by mouth 2 (two) times daily.    [provider]  hydrOXYzine (ATARAX/VISTARIL) 25 MG tablet Take 1 tablet (25 mg total) by mouth 3 (three) times daily. 06/05/18   Charm Rings, NP  lithium carbonate (LITHOBID) 300 MG CR tablet Take 1 tablet (300 mg total) by mouth daily with breakfast. 06/06/18   Charm Rings, NP  lithium carbonate (LITHOBID) 300 MG CR tablet Take 2 tablets (600 mg total) by mouth at bedtime. 06/05/18   Charm Rings, NP  omeprazole (PRILOSEC) 20 MG capsule Take 20 mg by mouth 2 (two) times daily before a meal.    [provider]  paliperidone (INVEGA SUSTENNA) 234 MG/1.5ML SUSY injection Inject 234 mg into the muscle every 30 (thirty) days.    [provider]  polyethylene glycol (MIRALAX / GLYCOLAX) packet Take 17 g by mouth daily.    [provider]  sertraline (ZOLOFT) 100 MG tablet Take 100 mg by mouth daily.    [provider]    Allergies Haldol [haloperidol lactate] and Shellfish allergy  No family history on file.  Social History Social History   Tobacco Use  . Smoking status: Current Every Day Smoker    Types: Cigars, Cigarettes    . Smokeless tobacco: Never Used  Substance Use Topics  . Alcohol use: Not Currently  . Drug use: Yes    Frequency: 3.0 times per week    Types: Marijuana    Review of Systems  Constitutional: Negative for fever. Eyes: Negative for visual changes. ENT: Negative for sore throat. Neck: No neck pain  Cardiovascular: Negative for chest pain. Respiratory: Negative for shortness of breath. Gastrointestinal: Negative for abdominal pain, vomiting or diarrhea. Genitourinary: Negative for dysuria. Musculoskeletal: Negative for back pain. Skin: Negative for rash. Neurological: Negative for headaches, weakness or numbness. Psych: + SI. No HI  ____________________________________________   PHYSICAL EXAM:  VITAL SIGNS: ED Triage Vitals  Enc Vitals Group     BP 09/08/19 0427 (!) 113/93     Pulse Rate 09/08/19 0427 89     Resp 09/08/19 0427 18     Temp 09/08/19 0427 98.6 F (37 C)     Temp Source 09/08/19 0427 Oral     SpO2 09/08/19 0427 98 %     Weight 09/08/19 0428 209 lb (94.8 kg)     Height 09/08/19 0428 5\' 10"  (1.778 m)     Head Circumference --      Peak Flow --      Pain Score 09/08/19 0428 0     Pain Loc --      Pain Edu? --      Excl. in GC? --     Constitutional: Alert and oriented. Well appearing and in no apparent distress. HEENT:      Head: Normocephalic and atraumatic.         Eyes: Conjunctivae are normal. Sclera is non-icteric.       Mouth/Throat: Mucous membranes are moist.       Neck: Supple with no signs of meningismus. Cardiovascular: Regular rate and rhythm.  Respiratory: Normal respiratory effort.  Gastrointestinal: Soft, non tender, and non distended. Musculoskeletal: No edema, cyanosis, or erythema of extremities. Neurologic: Normal speech and language. Face is symmetric. Moving all extremities. No gross focal neurologic deficits are appreciated. Skin: Skin is warm, dry and intact. No rash noted. Psychiatric: Mood and affect are  normal.  ____________________________________________   LABS (all labs ordered are listed, but only abnormal results are displayed)  Labs Reviewed  COMPREHENSIVE METABOLIC PANEL - Abnormal; Notable for the following components:      Result Value   Glucose, Bld 104 (*)    All other components within normal limits  SALICYLATE LEVEL - Abnormal; Notable for the following components:   Salicylate Lvl <7.0 (*)    All other components within normal limits  ACETAMINOPHEN LEVEL - Abnormal; Notable for the following components:   Acetaminophen (Tylenol), Serum <10 (*)    All other components within normal limits  ETHANOL  CBC  URINE DRUG SCREEN, QUALITATIVE ([facility] ONLY)   ____________________________________________  EKG  none  ____________________________________________  RADIOLOGY  I have personally reviewed the images performed during this visit and I agree with the Radiologist's read.   Interpretation by Radiologist:  11/08/19  Abdomen Acute W/Chest  Result Date: 09/08/2019 CLINICAL DATA:  Ingestion of foreign bodies (60 pennies. EXAM: DG ABDOMEN ACUTE W/ 1V CHEST COMPARISON:  Radiograph 01/06/2019 FINDINGS: There is a collection of high-density material presumably within the distal stomach/gastric antrum. These could represent coins There is additional ovoid 3.5 cm lesion which appears present on more remote radiograph (image 01/06/2019). No evidence of bowel obstruction. Linear metallic foreign bodies within the phallus of the penis IMPRESSION: 1. Collection of high-density foreign body within the gastric antrum. Likely a combination of coins and other material. 2. Linear wire like foreign body within the phallus. Electronically Signed   By: Suzy Bouchard M.D.   On: 09/08/2019 06:57      ____________________________________________   PROCEDURES  Procedure(s) performed: None Procedures Critical Care performed:  None ____________________________________________   INITIAL  IMPRESSION / ASSESSMENT AND PLAN / ED COURSE  39 y.o. male with a history of intellectual disability, personality disorder, schizoaffective disorder, PTSD  who presents to the hospital for psychiatric evaluation. Patient with active SI, swallowed $1 dollar worth of pennies as a suicide attempt. XR confirms that. Discussed with Dr. Allen Norris from GI who recommended monitoring for passage of coins, advises against endoscopy at this time since they are small coins and should pass with no difficulty. Advised to monitor clinically for any signs of obstruction.  Labs otherwise with no acute abnormalities. Patient waiting psych evaluation. Placed under IVC.   _________________________ 6:06 AM on 09/08/2019 -----------------------------------------  Patient vomited x 1. Passing flatus, not distended. Discussed with Dr. Dahlia Byes who recommended monitoring clinically for any signs of obstruction or perforation. This recommendation will be signed out at shift change to incoming MD. Once coins are in the colon, patient will be medically cleared. Old records reviewed showing several prior images where patient swallowed different foreign bodies.       Please note:  Patient was evaluated in Emergency Department today for the symptoms described in the history of present illness. Patient was evaluated in the context of the global COVID-19 pandemic, which necessitated consideration that the patient might be at risk for infection with the SARS-CoV-2 virus that causes COVID-19. Institutional protocols and algorithms that pertain to the evaluation of patients at risk for COVID-19 are in a state of rapid change based on information released by regulatory bodies including the CDC and federal and state organizations. These policies and algorithms were followed during the patient's care in the ED.  Some ED evaluations and interventions may be delayed as a result of limited staffing during the  pandemic.   ____________________________________________   FINAL CLINICAL IMPRESSION(S) / ED DIAGNOSES   Final diagnoses:  Swallowed foreign body, initial encounter  Suicide attempt [facility])      NEW MEDICATIONS STARTED DURING THIS VISIT:  ED Discharge Orders    None       Note:  This document was prepared using Dragon voice recognition software and may include unintentional dictation errors.    Alfred Levins, [place], MD 09/08/19 [phone]

## 2019-09-08 NOTE — ED Notes (Signed)
Patient's belongings placed in labeled belongings bag to be secured on the unit.  Blue slides, black colored socks, black colored briefs, black colored sweat pants, black colored t-shirt, brown colored wallett.

## 2019-09-08 NOTE — Discharge Instructions (Addendum)
Be sure to drink plenty of fluids to help the pennies pass.  Return to the ER if you develop any worsening pain nausea or vomiting.  Follow-up with PCP on Monday for repeat imaging

## 2021-01-20 ENCOUNTER — Emergency Department: Payer: Medicare Other

## 2021-01-20 ENCOUNTER — Other Ambulatory Visit: Payer: Self-pay

## 2021-01-20 ENCOUNTER — Emergency Department (EMERGENCY_DEPARTMENT_HOSPITAL)
Admission: EM | Admit: 2021-01-20 | Discharge: 2021-01-21 | Disposition: A | Discharge status: Other Acute Inpatient Hospital | Payer: Medicare Other | Source: Home / Self Care | Attending: Emergency Medicine | Admitting: Emergency Medicine

## 2021-01-20 ENCOUNTER — Encounter
Admission: EM | Disposition: A | Discharge status: Other Acute Inpatient Hospital | Payer: Self-pay | Source: Home / Self Care | Attending: Emergency Medicine

## 2021-01-20 DIAGNOSIS — Z20822 Contact with and (suspected) exposure to covid-19: Secondary | ICD-10-CM | POA: Insufficient documentation

## 2021-01-20 DIAGNOSIS — K5909 Other constipation: Secondary | ICD-10-CM | POA: Insufficient documentation

## 2021-01-20 DIAGNOSIS — T182XXA Foreign body in stomach, initial encounter: Secondary | ICD-10-CM

## 2021-01-20 DIAGNOSIS — F209 Schizophrenia, unspecified: Secondary | ICD-10-CM

## 2021-01-20 DIAGNOSIS — K219 Gastro-esophageal reflux disease without esophagitis: Secondary | ICD-10-CM | POA: Insufficient documentation

## 2021-01-20 DIAGNOSIS — F250 Schizoaffective disorder, bipolar type: Secondary | ICD-10-CM | POA: Insufficient documentation

## 2021-01-20 DIAGNOSIS — Z9151 Personal history of suicidal behavior: Secondary | ICD-10-CM | POA: Insufficient documentation

## 2021-01-20 DIAGNOSIS — R45850 Homicidal ideations: Secondary | ICD-10-CM | POA: Insufficient documentation

## 2021-01-20 DIAGNOSIS — X58XXXA Exposure to other specified factors, initial encounter: Secondary | ICD-10-CM | POA: Insufficient documentation

## 2021-01-20 DIAGNOSIS — Z79899 Other long term (current) drug therapy: Secondary | ICD-10-CM | POA: Insufficient documentation

## 2021-01-20 DIAGNOSIS — T189XXA Foreign body of alimentary tract, part unspecified, initial encounter: Secondary | ICD-10-CM

## 2021-01-20 DIAGNOSIS — Z046 Encounter for general psychiatric examination, requested by authority: Secondary | ICD-10-CM | POA: Insufficient documentation

## 2021-01-20 DIAGNOSIS — F259 Schizoaffective disorder, unspecified: Secondary | ICD-10-CM

## 2021-01-20 HISTORY — PX: ESOPHAGOGASTRODUODENOSCOPY (EGD) WITH PROPOFOL: SHX5813

## 2021-01-20 LAB — COMPREHENSIVE METABOLIC PANEL
ALT: 14 U/L (ref 0–44)
AST: 19 U/L (ref 15–41)
Albumin: 4.7 g/dL (ref 3.5–5.0)
Alkaline Phosphatase: 73 U/L (ref 38–126)
Anion gap: 10 (ref 5–15)
BUN: 9 mg/dL (ref 6–20)
CO2: 23 mmol/L (ref 22–32)
Calcium: 9.3 mg/dL (ref 8.9–10.3)
Chloride: 108 mmol/L (ref 98–111)
Creatinine, Ser: 0.88 mg/dL (ref 0.61–1.24)
GFR, Estimated: >60 mL/min (ref >60)
Glucose, Bld: 90 mg/dL (ref 70–99)
Potassium: 4 mmol/L (ref 3.5–5.1)
Sodium: 141 mmol/L (ref 135–145)
Total Bilirubin: 0.6 mg/dL (ref 0.3–1.2)
Total Protein: 7.4 g/dL (ref 6.5–8.1)

## 2021-01-20 LAB — CBC WITH DIFFERENTIAL/PLATELET
Abs Immature Granulocytes: 0.01 10*3/uL (ref 0.00–0.07)
Basophils Absolute: 0 10*3/uL (ref 0.0–0.1)
Basophils Relative: 1 %
Eosinophils Absolute: 0 10*3/uL (ref 0.0–0.5)
Eosinophils Relative: 0 %
HCT: 44.1 % (ref 39.0–52.0)
Hemoglobin: 14.2 g/dL (ref 13.0–17.0)
Immature Granulocytes: 0 %
Lymphocytes Relative: 39 %
Lymphs Abs: 2.2 10*3/uL (ref 0.7–4.0)
MCH: 27.2 pg (ref 26.0–34.0)
MCHC: 32.2 g/dL (ref 30.0–36.0)
MCV: 84.5 fL (ref 80.0–100.0)
Monocytes Absolute: 0.5 10*3/uL (ref 0.1–1.0)
Monocytes Relative: 8 %
Neutro Abs: 3 10*3/uL (ref 1.7–7.7)
Neutrophils Relative %: 52 %
Platelets: 206 10*3/uL (ref 150–400)
RBC: 5.22 MIL/uL (ref 4.22–5.81)
RDW: 13.2 % (ref 11.5–15.5)
WBC: 5.7 10*3/uL (ref 4.0–10.5)
nRBC: 0 % (ref 0.0–0.2)

## 2021-01-20 LAB — ETHANOL: Alcohol, Ethyl (B): <10 mg/dL (ref <10)

## 2021-01-20 LAB — LITHIUM LEVEL: Lithium Lvl: <0.06 mmol/L — ABNORMAL LOW (ref 0.60–1.20)

## 2021-01-20 LAB — ACETAMINOPHEN LEVEL: Acetaminophen (Tylenol), Serum: <10 ug/mL — ABNORMAL LOW (ref 10–30)

## 2021-01-20 LAB — SALICYLATE LEVEL: Salicylate Lvl: <7 mg/dL — ABNORMAL LOW (ref 7.0–30.0)

## 2021-01-20 SURGERY — ESOPHAGOGASTRODUODENOSCOPY (EGD) WITH PROPOFOL
Anesthesia: General

## 2021-01-20 MED ORDER — SERTRALINE HCL 50 MG PO TABS
100.0000 mg | ORAL_TABLET | Freq: Every day | ORAL | Status: DC
  Administered 2021-01-21: 100 mg via ORAL
  Filled 2021-01-20: qty 2

## 2021-01-20 MED ORDER — SUGAMMADEX SODIUM 200 MG/2ML IV SOLN
INTRAVENOUS | Status: DC | PRN
  Administered 2021-01-20: 300 mg via INTRAVENOUS

## 2021-01-20 MED ORDER — DEXMEDETOMIDINE (PRECEDEX) IN NS 20 MCG/5ML (4 MCG/ML) IV SYRINGE
PREFILLED_SYRINGE | INTRAVENOUS | Status: DC | PRN
  Administered 2021-01-20: 12 ug via INTRAVENOUS
  Administered 2021-01-20: 8 ug via INTRAVENOUS
  Administered 2021-01-20: 12 ug via INTRAVENOUS

## 2021-01-20 MED ORDER — POLYETHYLENE GLYCOL 3350 17 G PO PACK: 17.0000 g | PACK | Freq: Every day | ORAL | Status: DC

## 2021-01-20 MED ORDER — CLONAZEPAM 0.5 MG PO TABS
0.5000 mg | ORAL_TABLET | Freq: Once | ORAL | Status: AC
  Administered 2021-01-20: 0.5 mg via ORAL
  Filled 2021-01-20: qty 1

## 2021-01-20 MED ORDER — PROPOFOL 10 MG/ML IV BOLUS
INTRAVENOUS | Status: DC | PRN
  Administered 2021-01-20: 200 mg via INTRAVENOUS

## 2021-01-20 MED ORDER — MEPERIDINE HCL 25 MG/ML IJ SOLN: 6.2500 mg | INTRAMUSCULAR | Status: DC | PRN

## 2021-01-20 MED ORDER — PROPOFOL 500 MG/50ML IV EMUL
INTRAVENOUS | Status: AC
  Filled 2021-01-20: qty 50

## 2021-01-20 MED ORDER — LITHIUM CARBONATE ER 300 MG PO TBCR
600.0000 mg | EXTENDED_RELEASE_TABLET | Freq: Every day | ORAL | Status: DC
  Filled 2021-01-20 (×2): qty 2

## 2021-01-20 MED ORDER — ZIPRASIDONE MESYLATE 20 MG IM SOLR: 20.0000 mg | Freq: Two times a day (BID) | INTRAMUSCULAR | Status: DC | PRN

## 2021-01-20 MED ORDER — SODIUM CHLORIDE 0.9 % IV SOLN: INTRAVENOUS | Status: DC | PRN

## 2021-01-20 MED ORDER — OLANZAPINE 5 MG PO TBDP
10.0000 mg | ORAL_TABLET | Freq: Once | ORAL | Status: AC
  Administered 2021-01-20: 10 mg via ORAL
  Filled 2021-01-20: qty 2

## 2021-01-20 MED ORDER — BENZTROPINE MESYLATE 1 MG PO TABS
0.5000 mg | ORAL_TABLET | Freq: Two times a day (BID) | ORAL | Status: DC
  Administered 2021-01-21: 0.5 mg via ORAL
  Filled 2021-01-20 (×2): qty 1

## 2021-01-20 MED ORDER — PANTOPRAZOLE SODIUM 40 MG PO TBEC
40.0000 mg | DELAYED_RELEASE_TABLET | Freq: Every day | ORAL | Status: DC
  Administered 2021-01-21: 40 mg via ORAL
  Filled 2021-01-20: qty 1

## 2021-01-20 MED ORDER — LORAZEPAM 2 MG PO TABS: 2.0000 mg | ORAL_TABLET | Freq: Four times a day (QID) | ORAL | Status: DC | PRN

## 2021-01-20 MED ORDER — MIDAZOLAM HCL 2 MG/2ML IJ SOLN
INTRAMUSCULAR | Status: AC
  Filled 2021-01-20: qty 2

## 2021-01-20 MED ORDER — FENTANYL CITRATE (PF) 100 MCG/2ML IJ SOLN
INTRAMUSCULAR | Status: AC
  Filled 2021-01-20: qty 2

## 2021-01-20 MED ORDER — DEXAMETHASONE SODIUM PHOSPHATE 10 MG/ML IJ SOLN
INTRAMUSCULAR | Status: DC | PRN
  Administered 2021-01-20: 10 mg via INTRAVENOUS

## 2021-01-20 MED ORDER — LIDOCAINE HCL (CARDIAC) PF 100 MG/5ML IV SOSY
PREFILLED_SYRINGE | INTRAVENOUS | Status: DC | PRN
  Administered 2021-01-20: 50 mg via INTRAVENOUS

## 2021-01-20 MED ORDER — SUCCINYLCHOLINE CHLORIDE 200 MG/10ML IV SOSY
PREFILLED_SYRINGE | INTRAVENOUS | Status: DC | PRN
  Administered 2021-01-20: 120 mg via INTRAVENOUS

## 2021-01-20 MED ORDER — ONDANSETRON HCL 4 MG/2ML IJ SOLN
INTRAMUSCULAR | Status: DC | PRN
  Administered 2021-01-20: 4 mg via INTRAVENOUS

## 2021-01-20 MED ORDER — FENTANYL CITRATE PF 50 MCG/ML IJ SOSY: 25.0000 ug | PREFILLED_SYRINGE | INTRAMUSCULAR | Status: DC | PRN

## 2021-01-20 MED ORDER — FENTANYL CITRATE (PF) 100 MCG/2ML IJ SOLN
INTRAMUSCULAR | Status: DC | PRN
  Administered 2021-01-20 (×2): 50 ug via INTRAVENOUS

## 2021-01-20 MED ORDER — ONDANSETRON HCL 4 MG/2ML IJ SOLN: 4.0000 mg | Freq: Once | INTRAMUSCULAR | Status: DC | PRN

## 2021-01-20 MED ORDER — GLYCOPYRROLATE 0.2 MG/ML IJ SOLN
INTRAMUSCULAR | Status: DC | PRN
  Administered 2021-01-20: .2 mg via INTRAVENOUS

## 2021-01-20 MED ORDER — CLOZAPINE 25 MG PO TABS
250.0000 mg | ORAL_TABLET | Freq: Every day | ORAL | Status: DC
  Filled 2021-01-20 (×2): qty 2

## 2021-01-20 MED ORDER — ROCURONIUM BROMIDE 100 MG/10ML IV SOLN
INTRAVENOUS | Status: DC | PRN
  Administered 2021-01-20: 40 mg via INTRAVENOUS

## 2021-01-20 MED ORDER — DOCUSATE SODIUM 100 MG PO CAPS
200.0000 mg | ORAL_CAPSULE | Freq: Two times a day (BID) | ORAL | Status: DC
  Administered 2021-01-20 – 2021-01-21 (×2): 200 mg via ORAL
  Filled 2021-01-20 (×2): qty 2

## 2021-01-20 MED ORDER — MIDAZOLAM HCL 2 MG/2ML IJ SOLN
INTRAMUSCULAR | Status: DC | PRN
  Administered 2021-01-20: 2 mg via INTRAVENOUS

## 2021-01-20 NOTE — ED Notes (Signed)
Pt given sandwich tray 

## 2021-01-20 NOTE — BH Assessment (Signed)
Comprehensive Clinical Assessment (CCA) Note  01/20/2021 Lawrence Morgan [id number] Recommendations for Services/Supports/Treatments: Pt's disposition pending psych consult.  Pt presented with an irritable mood and a congruent affect. Pt's speech is profane but comprehensible. Pt had linear and cogent thought processes. Pt was noted to be responding to internal/external stimuli, as he reported that there was someone kneeling in the corner of the room praying to Allah. Pt was cooperative and forthcoming throughout the assessment. Pt repeatedly stated, "I need to be locked up somewhere long term to get a peace of mind" when asked why he'd presented to the hospital. Pt reported that he has command hallucinations that tell him to hurt himself or others. Pt explained that his sleep is impacted by his voices stating, "they won't shut the fuck up." Pt reports having a fair appetite. Pt endorsed symptoms of depression. Pt explained that he resides at a group home and things are ok there; however, he's just ready to move on. Pt expressed frustration with his current psych meds and feels that they need adjusting. Pt explained that his current provider refuses to listen to his concerns about his medications and make adjustments. The pt endorsed thoughts of SI, explaining that his thoughts are persistent and ever present. The pt explained that he is high risk for self-harm as he engages in carving and swallowing objects just as he had prior to his arrival.   Chief Complaint:  Chief Complaint  Patient presents with   Psychiatric Evaluation   Visit Diagnosis: Schizoaffective disorder (HCC) Diagnosis:  Principal Problem:   Schizoaffective disorder (HCC) Active Problems:   Chronic constipation   GERD (gastroesophageal reflux disease)   Ingestion of foreign body      CCA Screening, Triage and Referral (STR)  Patient Reported Information How did you hear about [place]? Family/Friend  Referral name: No data  recorded Referral phone number: No data recorded  Whom do you see for routine medical problems? No data recorded Practice/Facility Name: No data recorded Practice/Facility Phone Number: No data recorded Name of Contact: No data recorded Contact Number: No data recorded Contact Fax Number: No data recorded Prescriber Name: No data recorded Prescriber Address (if known): No data recorded  What Is the Reason for Your Visit/Call Today? Pt has had increased agression, thoughts of SI, and ingested batteries.  How Long Has This Been Causing You Problems? > than 6 months  What Do You Feel Would Help You the Most Today? Medication(s); Treatment for Depression or other mood problem   Have You Recently Been in Any Inpatient Treatment (Hospital/Detox/Crisis Center/28-Day Program)? No data recorded Name/Location of Program/Hospital:No data recorded How Long Were You There? No data recorded When Were You Discharged? No data recorded  Have You Ever Received Services From [facility] Before? No data recorded Who Do You See at [facility]? No data recorded  Have You Recently Had Any Thoughts About Hurting Yourself? Yes  Are You Planning to Commit Suicide/Harm Yourself At This time? No   Have you Recently Had Thoughts About Hurting Someone Lawrence Morgan? Yes  Explanation: No data recorded  Have You Used Any Alcohol or Drugs in the Past 24 Hours? No  How Long Ago Did You Use Drugs or Alcohol? No data recorded What Did You Use and How Much? No data recorded  Do You Currently Have a Therapist/Psychiatrist? Yes  Name of Therapist/Psychiatrist: No data recorded  Have You Been Recently Discharged From Any Office Practice or Programs? No  Explanation of Discharge From Practice/Program: No data recorded  CCA Screening Triage Referral Assessment Type of Contact: Face-to-Face  Is this Initial or Reassessment? No data recorded Date Telepsych consult ordered in [facility]:  No data recorded Time Telepsych  consult ordered in [facility]:  No data recorded  Patient Reported Information Reviewed? No data recorded Patient Left Without Being Seen? No data recorded Reason for Not Completing Assessment: No data recorded  Collateral Involvement: Lawrence Morgan Legal Guardian   Does Patient Have a Court Appointed Legal Guardian? No data recorded Name and Contact of Legal Guardian: No data recorded If Minor and Not Living with Parent(s), Who has Custody? No data recorded Is CPS involved or ever been involved? Never  Is APS involved or ever been involved? Never   Patient Determined To Be At Risk for Harm To Self or Others Based on Review of Patient Reported Information or Presenting Complaint? Yes, for Self-Harm  Method: No data recorded Availability of Means: No data recorded Intent: No data recorded Notification Required: No data recorded Additional Information for Danger to Others Potential: No data recorded Additional Comments for Danger to Others Potential: No data recorded Are There Guns or Other Weapons in Your Home? No data recorded Types of Guns/Weapons: No data recorded Are These Weapons Safely Secured?                            No data recorded Who Could Verify You Are Able To Have These Secured: No data recorded Do You Have any Outstanding Charges, Pending Court Dates, Parole/Probation? No data recorded Contacted To Inform of Risk of Harm To Self or Others: No data recorded  Location of Assessment: [facility] ED   Does Patient Present under Involuntary Commitment? Yes  IVC Papers Initial File Date: 01/20/21   [place] of Residence: [place]   Patient Currently Receiving the Following Services: Medication Management; Group Home   Determination of Need: Emergent (2 hours)   Options For Referral: Other: Comment; Therapeutic Triage Services (Pending psych consult)     CCA Biopsychosocial Intake/Chief Complaint:  No data recorded Current Symptoms/Problems: No data  recorded  Patient Reported Schizophrenia/Schizoaffective Diagnosis in Past: Yes   Strengths: Pt has good insight; able to ask for help  Preferences: No data recorded Abilities: No data recorded  Type of Services Patient Feels are Needed: No data recorded  Initial Clinical Notes/Concerns: No data recorded  Mental Health Symptoms Depression:   Irritability; Sleep (too much or little); Difficulty Concentrating; Hopelessness   Duration of Depressive symptoms:  Less than two weeks   Mania:   None   Anxiety:    Worrying; Tension; Restlessness; Fatigue   Psychosis:   Hallucinations   Duration of Psychotic symptoms:  Greater than six months   Trauma:   N/A   Obsessions:   N/A   Compulsions:   N/A   Inattention:   N/A   Hyperactivity/Impulsivity:   N/A   Oppositional/Defiant Behaviors:   Aggression towards people/animals; Easily annoyed; Temper   Emotional Irregularity:   Potentially harmful impulsivity; Recurrent suicidal behaviors/gestures/threats; Intense/inappropriate anger; Mood lability   Other Mood/Personality Symptoms:  No data recorded   Mental Status Exam Appearance and self-care  Stature:   Average   Weight:   Average weight   Clothing:   Casual   Grooming:   Normal   Cosmetic use:   None   Posture/gait:   Normal   Motor activity:   Tremor   Sensorium  Attention:   Normal   Concentration:  Focuses on irrelevancies   Orientation:   Time   Recall/memory:   Normal   Affect and Mood  Affect:   Constricted   Mood:   Dysphoric   Relating  Eye contact:   Normal   Facial expression:   Tense   Attitude toward examiner:   Cooperative   Thought and Language  Speech flow:  Profane   Thought content:   Appropriate to Mood and Circumstances   Preoccupation:   None   Hallucinations:   Visual; Auditory   Organization:  No data recorded  [employer] of Knowledge:   Average   Intelligence:    Average   Abstraction:   Functional   Judgement:   Impaired   Reality Testing:   Adequate   Insight:   Present; Uses connections   Decision Making:   Normal   Social Functioning  Social Maturity:   Impulsive   Social Judgement:   Impropriety   Stress  Stressors:   Housing   Coping Ability:   Overwhelmed   Skill Deficits:   Communication; Decision making; Self-control   Supports:   Friends/Service system; Support needed; Family     Religion: Religion/Spirituality Are You A Religious Person?: No  Leisure/Recreation: Leisure / Recreation Do You Have Hobbies?: No  Exercise/Diet: Exercise/Diet Do You Exercise?: No Have You Gained or Lost A Significant Amount of Weight in the Past Six Months?: No Do You Follow a Special Diet?: No Do You Have Any Trouble Sleeping?: Yes Explanation of Sleeping Difficulties: Pt explained that his auditory voices are too loud and intrusive, which causes his sleep difficulties.   CCA Employment/Education Employment/Work Situation: Employment / Work Systems developer: On disability Why is Patient on Disability: Mental Health How Long has Patient Been on Disability: Unknown Patient's Job has Been Impacted by Current Illness: No Has Patient ever Been in the [employer]?: No  Education: Education Is Patient Currently Attending School?: No Did You Have An Individualized Education Program (IIEP): No Did You Have Any Difficulty At School?: No Patient's Education Has Been Impacted by Current Illness: No   CCA Family/Childhood History Family and Relationship History: Family history Marital status: Single Does patient have children?: No  Childhood History:     Child/Adolescent Assessment:     CCA Substance Use Alcohol/Drug Use: Alcohol / Drug Use Pain Medications: See MAR Prescriptions: See MAR Over the Counter: See MAR History of alcohol / drug use?: No history of alcohol / drug abuse                          ASAM's:  Six Dimensions of Multidimensional Assessment  Dimension 1:  Acute Intoxication and/or Withdrawal Potential:      Dimension 2:  Biomedical Conditions and Complications:      Dimension 3:  Emotional, Behavioral, or Cognitive Conditions and Complications:     Dimension 4:  Readiness to Change:     Dimension 5:  Relapse, Continued use, or Continued Problem Potential:     Dimension 6:  Recovery/Living Environment:     ASAM Severity Score:    ASAM Recommended Level of Treatment:     Substance use Disorder (SUD)    Recommendations for Services/Supports/Treatments:    DSM5 Diagnoses: Patient Active Problem List   Diagnosis Date Noted   Schizoaffective disorder (HCC) 01/20/2021   Chronic constipation 01/20/2021   GERD (gastroesophageal reflux disease) 01/20/2021   Ingestion of foreign body 01/20/2021   Foreign body ingestion, initial  encounter     Nash-Finch CompanyJasmine R , LCAS

## 2021-01-20 NOTE — Progress Notes (Signed)
Pharmacy - Clozapine     This patient's order has been reviewed for prescribing contraindications.   Labs:  [facility] 8/23 3.0 K/uL  The medication is being dispensed pursuant to the FDA REMS suspension order of 04/18/20 that allows for dispensing without a patient REMS dispense authorization (RDA).   Next ANC 8/30.  Pricilla Riffle, PharmD, BCPS Clinical Pharmacist 01/20/2021 3:13 PM

## 2021-01-20 NOTE — ED Notes (Signed)
Legal guardian, Shelly Coss called for consent to treat; listed legal guardian.

## 2021-01-20 NOTE — ED Notes (Signed)
Pt to endo via wheelchair with RN and Security

## 2021-01-20 NOTE — ED Notes (Signed)
Refuses to give any collateral information, begins shouting at nurse.

## 2021-01-20 NOTE — Anesthesia Postprocedure Evaluation (Signed)
Anesthesia Post Note  Patient: Lawrence Morgan  Procedure(s) Performed: ESOPHAGOGASTRODUODENOSCOPY (EGD) WITH PROPOFOL  Patient location during evaluation: PACU Anesthesia Type: General Level of consciousness: awake and alert, awake and oriented Pain management: pain level controlled Vital Signs Assessment: post-procedure vital signs reviewed and stable Respiratory status: spontaneous breathing, nonlabored ventilation and respiratory function stable Cardiovascular status: blood pressure returned to baseline and stable Postop Assessment: no apparent nausea or vomiting Anesthetic complications: no   No notable events documented.   Last Vitals:  Vitals:   01/20/21 1645 01/20/21 1700  BP: 120/81   Pulse: 85 76  Resp: (!) 24 19  Temp: 36.6 C (!) 36.2 C  SpO2: 100% 100%    Last Pain:  Vitals:   01/20/21 1700  TempSrc:   PainSc: 0-No pain                 Manfred Arch

## 2021-01-20 NOTE — Consult Note (Signed)
[facility] Face-to-Face Psychiatry Consult   Reason for Consult: Consult for this 40 year old man with a history of schizoaffective disorder brought to the hospital under IVC Referring Physician: Darnelle Catalan Patient Identification: Lawrence Morgan MRN:  [id number] Principal Diagnosis: Schizoaffective disorder (HCC) Diagnosis:  Principal Problem:   Schizoaffective disorder (HCC) Active Problems:   Chronic constipation   GERD (gastroesophageal reflux disease)   Ingestion of foreign body   Total Time spent with patient: 1 hour  Subjective:   Lawrence Morgan is a 40 y.o. male patient admitted with "I just wanted to chop some heads".  HPI: Patient seen.  Chart reviewed.  Also spoke with the commitment petitioner who is a member of his ACT team.  She reports that she was called to [facility] today because the patient was getting agitated and disorganized and making homicidal threats.  Patient stated that he wanted to chop people's heads off.  Apparently he also threw a chair into a wall at [facility].  On interview today the patient is loud agitated but was not aggressive towards me.  Shouted a few times but did not threaten me.  Told me that he wanted to chop some people's heads off but nobody in particular.  His history is rambling and disorganized.  He talks about how people are talking in his head and telling him things all the time.  He says he needs his medicine adjusted.  He claims that he has not seen his doctor in several months and does not know what medicines he is currently taking.  The ACT team representative tells me that they have been trying to get the group home to give him his medicine more regularly but that has been a bit of a problem recently.  No known alcohol or drug use and the patient denies any recent alcohol or drug use.  Denies suicidal thoughts.  He also reports that he swallowed a battery.  This seems to be a behavior he has fairly frequently.  The abdominal x-ray right now is still pending.  Past  Psychiatric History: Patient has a long history of schizoaffective disorder.  Several prior admissions.  History of swallowing foreign bodies sometimes when he is agitated.  He is on clozapine lives in a group home and has an ACT team.  He told me that he had tried to kill himself in the past could not give me any details about it.  Risk to Self:   Risk to Others:   Prior Inpatient Therapy:   Prior Outpatient Therapy:    Past Medical History: No past medical history on file. History reviewed. No pertinent surgical history. Family History: No family history on file. Family Psychiatric  History: Family history unknown Social History:  Social History   Substance and Sexual Activity  Alcohol Use None     Social History   Substance and Sexual Activity  Drug Use Not on file    Social History   Socioeconomic History   Marital status: Single    Spouse name: Not on file   Number of children: Not on file   Years of education: Not on file   Highest education level: Not on file  Occupational History   Not on file  Tobacco Use   Smoking status: Not on file   Smokeless tobacco: Not on file  Substance and Sexual Activity   Alcohol use: Not on file   Drug use: Not on file   Sexual activity: Not on file  Other Topics Concern   Not on  file  Social History Narrative   Not on file   Social Determinants of Health   Financial Resource Strain: Not on file  Food Insecurity: Not on file  Transportation Needs: Not on file  Physical Activity: Not on file  Stress: Not on file  Social Connections: Not on file   Additional Social History:    Allergies:  No Known Allergies  Labs:  Results for orders placed or performed during the hospital encounter of 01/20/21 (from the past 48 hour(s))  Comprehensive metabolic panel     Status: None   Collection Time: 01/20/21  2:04 PM  Result Value Ref Range   Sodium 141 135 - 145 mmol/L   Potassium 4.0 3.5 - 5.1 mmol/L   Chloride 108 98 - 111  mmol/L   CO2 23 22 - 32 mmol/L   Glucose, Bld 90 70 - 99 mg/dL    Comment: Glucose reference range applies only to samples taken after fasting for at least 8 hours.   BUN 9 6 - 20 mg/dL   Creatinine, Ser 1.610.88 0.61 - 1.24 mg/dL   Calcium 9.3 8.9 - 09.610.3 mg/dL   Total Protein 7.4 6.5 - 8.1 g/dL   Albumin 4.7 3.5 - 5.0 g/dL   AST 19 15 - 41 U/L   ALT 14 0 - 44 U/L   Alkaline Phosphatase 73 38 - 126 U/L   Total Bilirubin 0.6 0.3 - 1.2 mg/dL   GFR, Estimated >04>60 >54>60 mL/min    Comment: (NOTE) Calculated using the CKD-EPI Creatinine Equation (2021)    Anion gap 10 5 - 15    Comment: Performed at [facility], [address] [place], [place] [address]  Ethanol     Status: None   Collection Time: 01/20/21  2:04 PM  Result Value Ref Range   Alcohol, Ethyl (B) <10 <10 mg/dL    Comment: (NOTE) Lowest detectable limit for serum alcohol is 10 mg/dL.  For medical purposes only. Performed at [facility], [address] [place], [place] [address]   CBC with Differential     Status: None   Collection Time: 01/20/21  2:04 PM  Result Value Ref Range   WBC 5.7 4.0 - 10.5 K/uL   RBC 5.22 4.22 - 5.81 MIL/uL   Hemoglobin 14.2 13.0 - 17.0 g/dL   HCT 29.544.1 62.139.0 - 30.852.0 %   MCV 84.5 80.0 - 100.0 fL   MCH 27.2 26.0 - 34.0 pg   MCHC 32.2 30.0 - 36.0 g/dL   RDW 65.713.2 84.611.5 - 96.215.5 %   Platelets 206 150 - 400 K/uL   nRBC 0.0 0.0 - 0.2 %   Neutrophils Relative % 52 %   Neutro Abs 3.0 1.7 - 7.7 K/uL   Lymphocytes Relative 39 %   Lymphs Abs 2.2 0.7 - 4.0 K/uL   Monocytes Relative 8 %   Monocytes Absolute 0.5 0.1 - 1.0 K/uL   Eosinophils Relative 0 %   Eosinophils Absolute 0.0 0.0 - 0.5 K/uL   Basophils Relative 1 %   Basophils Absolute 0.0 0.0 - 0.1 K/uL   Immature Granulocytes 0 %   Abs Immature Granulocytes 0.01 0.00 - 0.07 K/uL    Comment: Performed at [facility], [address] [place], [place] [address]    Current Facility-Administered Medications   Medication Dose Route Frequency Provider Last Rate Last Admin   benztropine (COGENTIN) tablet 0.5 mg  0.5 mg Oral BID , Jackquline DenmarkJohn T, MD  cloZAPine (CLOZARIL) tablet 250 mg  250 mg Oral QHS ,  T, MD       docusate sodium (COLACE) capsule 200 mg  200 mg Oral BID ,  T, MD       lithium carbonate (LITHOBID) CR tablet 600 mg  600 mg Oral QHS ,  T, MD       LORazepam (ATIVAN) tablet 2 mg  2 mg Oral Q6H PRN ,  T, MD       pantoprazole (PROTONIX) EC tablet 40 mg  40 mg Oral Daily ,  T, MD       polyethylene glycol (MIRALAX / GLYCOLAX) packet 17 g  17 g Oral Daily ,  T, MD       sertraline (ZOLOFT) tablet 100 mg  100 mg Oral Daily , [name], MD       No current outpatient medications on file.    Musculoskeletal: Strength & Muscle Tone: within normal limits Gait & Station: normal Patient leans: N/A            Psychiatric Specialty Exam:  Presentation  General Appearance:  No data recorded Eye Contact: No data recorded Speech: No data recorded Speech Volume: No data recorded Handedness: No data recorded  Mood and Affect  Mood: No data recorded Affect: No data recorded  Thought Process  Thought Processes: No data recorded Descriptions of Associations:No data recorded Orientation:No data recorded Thought Content:No data recorded History of Schizophrenia/Schizoaffective disorder:No data recorded Duration of Psychotic Symptoms:No data recorded Hallucinations:No data recorded Ideas of Reference:No data recorded Suicidal Thoughts:No data recorded Homicidal Thoughts:No data recorded  Sensorium  Memory: No data recorded Judgment: No data recorded Insight: No data recorded  Executive Functions  Concentration: No data recorded Attention Span: No data recorded Recall: No data recorded Fund of Knowledge: No data recorded Language: No data recorded  Psychomotor Activity   Psychomotor Activity: No data recorded  Assets  Assets: No data recorded  Sleep  Sleep: No data recorded  Physical Exam: Physical Exam Vitals and nursing note reviewed.  Constitutional:      Appearance: Normal appearance.  HENT:     Head: Normocephalic and atraumatic.     Mouth/Throat:     Pharynx: Oropharynx is clear.  Eyes:     Pupils: Pupils are equal, round, and reactive to light.  Cardiovascular:     Rate and Rhythm: Normal rate and regular rhythm.  Pulmonary:     Effort: Pulmonary effort is normal.     Breath sounds: Normal breath sounds.  Abdominal:     General: Abdomen is flat.     Palpations: Abdomen is soft.  Musculoskeletal:        General: Normal range of motion.  Skin:    General: Skin is warm and dry.  Neurological:     General: No focal deficit present.     Mental Status: He is alert. Mental status is at baseline.  Psychiatric:        Attention and Perception: He is inattentive.        Mood and Affect: Mood normal. Affect is labile and inappropriate.        Speech: Speech is tangential.        Behavior: Behavior is agitated. Behavior is not aggressive.        Thought Content: Thought content includes homicidal ideation. Thought content does not include suicidal ideation. Thought content does not include homicidal plan.        Cognition and Memory: Memory is impaired.  Judgment: Judgment is impulsive and inappropriate.   Review of Systems  Constitutional: Negative.   HENT: Negative.    Eyes: Negative.   Respiratory: Negative.    Cardiovascular: Negative.   Gastrointestinal: Negative.   Musculoskeletal: Negative.   Skin: Negative.   Neurological: Negative.   Psychiatric/Behavioral:  Positive for hallucinations. Negative for depression, substance abuse and suicidal ideas. The patient is nervous/anxious and has insomnia.   Blood pressure 136/88, pulse 66, temperature 98.6 F (37 C), temperature source Oral, resp. rate 16, height 5\' 10"   (1.778 m), weight 83.9 kg, SpO2 100 %. Body mass index is 26.54 kg/m.  Treatment Plan Summary: Medication management and Plan 40 year old man with schizoaffective disorder presents agitated confused disorganized with psychotic thinking and evidence of hallucinations.  Some labs still pending.  Patient however denies substance abuse.  X-ray still pending that he reports that he swallowed a battery which is a behavior he has had in the past.  Unknown whether he has been fully compliant with his medicine.  And his current condition patient will need hospitalization.  Continue IVC.  Administered some medicines for anxiety acutely while researching his old medicine.  Orders now place to continue prior outpatient medications.  Patient will need to calm down before he would be potentially eligible for admission to our unit.  Team will continue to reevaluate.  Disposition: Recommend psychiatric Inpatient admission when medically cleared. Supportive therapy provided about ongoing stressors.  43, MD 01/20/2021 3:04 PM

## 2021-01-20 NOTE — Op Note (Signed)
[facility] Gastroenterology Patient Name: Lawrence Morgan Procedure Date: 01/20/2021 3:40 PM MRN: [id number] Account #: [id number] Date of Birth: 05-24-1981 Admit Type: Inpatient Age: 40 Room: [facility] ENDO ROOM 4 Gender: Male Note Status: Finalized Procedure:             Upper GI endoscopy Indications:           Foreign body in the stomach Providers:             Midge Minium MD, MD Referring MD:          No Local Md, MD (Referring MD) Medicines:             General Anesthesia Complications:         No immediate complications. Procedure:             Pre-Anesthesia Assessment:                        - Prior to the procedure, a History and Physical was                         performed, and patient medications and allergies were                         reviewed. The patient's tolerance of previous                         anesthesia was also reviewed. The risks and benefits                         of the procedure and the sedation options and risks                         were discussed with the patient. All questions were                         answered, and informed consent was obtained. Prior                         Anticoagulants: The patient has taken no previous                         anticoagulant or antiplatelet agents. ASA Grade                         Assessment: II - A patient with mild systemic disease.                         After reviewing the risks and benefits, the patient                         was deemed in satisfactory condition to undergo the                         procedure.                        After obtaining informed consent, the endoscope was  passed under direct vision. Throughout the procedure,                         the patient's blood pressure, pulse, and oxygen                         saturations were monitored continuously. The Endoscope                         was introduced through the mouth, and advanced to  the                         second part of duodenum. The upper GI endoscopy was                         performed with difficulty due to presence of foreign                         body. The patient tolerated the procedure well. Findings:      The examined esophagus was normal.      A 9 Volt battery was found in the gastric body. The endoscope was       removed, and an overtube with cap was fitted. The scope and overtube       were then reinserted via the mouth and advanced to the esophagus to aid       in foreign body removal. Removal was accomplished with a snare.      The examined duodenum was normal. Impression:            - Normal esophagus.                        - A battery was found in the stomach. Removal was                         successful.                        - Normal examined duodenum.                        - An overtube with cap was used to aid in foreign body                         removal. Recommendation:        - Discharge patient to home.                        - Resume previous diet.                        - Continue present medications. Procedure Code(s):     --- Professional ---                        [phone], Esophagogastroduodenoscopy, flexible,                         transoral; with removal of foreign body(s) Diagnosis Code(s):     --- Professional ---  T18.2XXA, Foreign body in stomach, initial encounter CPT copyright 2019 [employer] Association. All rights reserved. The codes documented in this report are preliminary and upon coder review may  be revised to meet current compliance requirements. Midge Minium MD, MD 01/20/2021 4:34:49 PM This report has been signed electronically. Number of Addenda: 0 Note Initiated On: 01/20/2021 3:40 PM Estimated Blood Loss:  Estimated blood loss: none.      [facility]

## 2021-01-20 NOTE — Anesthesia Postprocedure Evaluation (Signed)
Anesthesia Post Note  Patient: Lawrence Morgan  Procedure(s) Performed: ESOPHAGOGASTRODUODENOSCOPY (EGD) WITH PROPOFOL  Anesthesia Type: General Anesthetic complications: no   No notable events documented.   Last Vitals:  Vitals:   01/20/21 1645 01/20/21 1700  BP: 120/81   Pulse: 85 76  Resp: (!) 24 19  Temp: 36.6 C (!) 36.2 C  SpO2: 100% 100%    Last Pain:  Vitals:   01/20/21 1700  TempSrc:   PainSc: 0-No pain                 Manfred Arch

## 2021-01-20 NOTE — H&P (Signed)
   Midge Minium, MD [facility] [address], [address] [place], [place] [address] Phone:[phone] Fax : [phone]  Primary Care Physician:  Pcp, No Primary Gastroenterologist:  Dr. Servando Snare  Pre-Procedure History & Physical: HPI:  Calloway Andrus is a 40 y.o. male is here for an endoscopy.   No past medical history on file.  History reviewed. No pertinent surgical history.  Prior to Admission medications   Medication Sig Start Date End Date Taking? Authorizing Provider  Ascorbic Acid (VITAMIN C) 100 MG tablet Take 100 mg by mouth daily.   Yes [provider]  docusate sodium (COLACE) 250 MG capsule Take 250 mg by mouth daily.   Yes [provider]    Allergies as of 01/20/2021   (No Known Allergies)    No family history on file.  Social History   Socioeconomic History   Marital status: Single    Spouse name: Not on file   Number of children: Not on file   Years of education: Not on file   Highest education level: Not on file  Occupational History   Not on file  Tobacco Use   Smoking status: Not on file   Smokeless tobacco: Not on file  Substance and Sexual Activity   Alcohol use: Not on file   Drug use: Not on file   Sexual activity: Not on file  Other Topics Concern   Not on file  Social History Narrative   Not on file   Social Determinants of Health   Financial Resource Strain: Not on file  Food Insecurity: Not on file  Transportation Needs: Not on file  Physical Activity: Not on file  Stress: Not on file  Social Connections: Not on file  Intimate Partner Violence: Not on file    Review of Systems: See HPI, otherwise negative ROS  Physical Exam: BP 139/88   Pulse 64   Temp (!) 97.4 F (36.3 C) (Temporal)   Resp 20   Ht 5\' 10"  (1.778 m)   Wt 83.9 kg   SpO2 100%   BMI 26.54 kg/m  General:   Alert,  pleasant and cooperative in NAD Head:  Normocephalic and atraumatic. Neck:  Supple; no masses or thyromegaly. Lungs:  Clear  throughout to auscultation.    Heart:  Regular rate and rhythm. Abdomen:  Soft, nontender and nondistended. Normal bowel sounds, without guarding, and without rebound.   Neurologic:  Alert and  oriented x4;  grossly normal neurologically.  Impression/Plan: Lott Seelbach is here for an endoscopy to be performed for battery removal  Risks, benefits, limitations, and alternatives regarding  endoscopy have been reviewed with the patient.  Questions have been answered.  All parties agreeable.   Jason Fila, MD  01/20/2021, 4:37 PM

## 2021-01-20 NOTE — ED Notes (Signed)
Report to Bernette Redbird, RN in Endoscopy

## 2021-01-20 NOTE — ED Provider Notes (Signed)
[facility] Emergency Department Provider Note  ____________________________________________   Event Date/Time   First MD Initiated Contact with Patient 01/20/21 1357     (approximate)  I have reviewed the triage vital signs and the nursing notes.   HISTORY  Chief Complaint Psychiatric Evaluation History limited by schizophrenia   HPI Lawrence Morgan is a 40 y.o. male patient with history of schizophrenia who comes in saying he has a list of people he wants to decapitate and he was throwing chairs at [facility]  He came in under commitment.  Officer reports that he was sweeping the patient for hidden metal and metal detector went off against the patient's bare skin in the epigastric area.  There was no metal in the vicinity.         No past medical history on file.  Patient Active Problem List   Diagnosis Date Noted   Schizoaffective disorder (HCC) 01/20/2021   Chronic constipation 01/20/2021   GERD (gastroesophageal reflux disease) 01/20/2021   Ingestion of foreign body 01/20/2021   Patient's past history that I can determine is only significant for schizophrenia  Prior to Admission medications   Not on File    Allergies Patient has no known allergies.  No family history on file.  Social History    Review of Systems  Constitutional: No fever/chills Eyes: No visual changes. ENT: No sore throat. Cardiovascular: Denies chest pain. Respiratory: Denies shortness of breath. Gastrointestinal: No abdominal pain.  No nausea, no vomiting.  No diarrhea.  No constipation. Genitourinary: Negative for dysuria. Musculoskeletal: Negative for back pain. Skin: Negative for rash. Neurological: Negative for headaches, focal weakness   ____________________________________________   PHYSICAL EXAM:  VITAL SIGNS: ED Triage Vitals  Enc Vitals Group     BP      Pulse      Resp      Temp      Temp src      SpO2      Weight      Height      Head  Circumference      Peak Flow      Pain Score      Pain Loc      Pain Edu?      Excl. in GC?     Constitutional: Alert and oriented. Well appearing is a little upset but willing to go along with the exam Eyes: Conjunctivae are normal. PER Head: Atraumatic. Nose: No congestion/rhinnorhea. Mouth/Throat: Mucous membranes are moist.  Oropharynx non-erythematous. Neck: No stridor.   Cardiovascular: Normal rate, regular rhythm. Grossly normal heart sounds.  Good peripheral circulation. Respiratory: Normal respiratory effort.  No retractions. Lungs CTAB. Gastrointestinal: Soft and nontender. No distention. No abdominal bruits.  Musculoskeletal: No lower extremity tenderness nor edema.   Neurologic:  Normal speech and language. No gross focal neurologic deficits are appreciated.  Skin:  Skin is warm, dry and intact. No rash noted.  ____________________________________________   LABS (all labs ordered are listed, but only abnormal results are displayed)  Labs Reviewed  ACETAMINOPHEN LEVEL - Abnormal; Notable for the following components:      Result Value   Acetaminophen (Tylenol), Serum <10 (*)    All other components within normal limits  SALICYLATE LEVEL - Abnormal; Notable for the following components:   Salicylate Lvl <7.0 (*)    All other components within normal limits  LITHIUM LEVEL - Abnormal; Notable for the following components:   Lithium Lvl <0.06 (*)    All other  components within normal limits  COMPREHENSIVE METABOLIC PANEL  ETHANOL  CBC WITH DIFFERENTIAL/PLATELET  URINE DRUG SCREEN, QUALITATIVE ([facility] ONLY)   ____________________________________________  EKG   ____________________________________________  RADIOLOGY Jill Poling, personally viewed and evaluated these images (plain radiographs) as part of my medical decision making, as well as reviewing the written report by the radiologist.  ED MD interpretation: Chest x-ray shows what appears to be a 9 V  battery in the patient's stomach area.  When patient asked he confirms he ate a battery.  Abdominal film shows tumor.  B AA batteries.  Patient confirms he ate batteries but has been a few days he thinks.  He does not remember exactly when or least cannot say.  Official radiology report(s): DG Chest 1 View  Result Date: 01/20/2021 CLINICAL DATA:  Suspected ingested metallic foreign body within the epigastric region EXAM: CHEST  1 VIEW COMPARISON:  04/27/2008 FINDINGS: Upright frontal view of the chest includes portions of the upper abdomen. The cardiac silhouette is unremarkable. There is a rectangular metallic foreign body overlying the gastric body, likely a 9V battery. This measures approximately 5.3 x 3.1 cm. No airspace disease, effusion, or pneumothorax. No acute bony abnormality. IMPRESSION: 1. Likely ingested battery overlying the region of the gastric body. 2. Otherwise no acute intrathoracic process. Electronically Signed   By: Sharlet Salina M.D.   On: 01/20/2021 15:10   DG Abdomen 1 View  Result Date: 01/20/2021 CLINICAL DATA:  Swallowed foreign body. EXAM: ABDOMEN - 1 VIEW COMPARISON:  Chest x-ray of the same date. FINDINGS: No signs of bowel obstruction. Foreign bodies in the abdomen, batteries in the RIGHT hemiabdomen, likely the aa batteries. Nine volt battery in the stomach or transverse colon, more likely in the stomach. Soft tissues otherwise unremarkable. No acute skeletal process on limited assessment. IMPRESSION: Signs of ingested batteries as described. This places this patient at risk for bowel injury or perforation due to battery breakdown. Critical Value/emergent results were called by telephone at the time of interpretation on 01/20/2021 at 3:13 pm to provider [facility] , who verbally acknowledged these results. Electronically Signed   By: Donzetta Kohut M.D.   On: 01/20/2021 15:15    ____________________________________________   PROCEDURES  Procedure(s) performed  (including Critical Care): Critical care time 25 minutes including examining the patient reviewing the x-ray speaking to the radiologist later and talking to poison control and gastroenterology and then talking to the patient again about what we need to do.  Procedures ----------------------------------------- 2:52 PM on 01/20/2021 ----------------------------------------- Poison control said they are not worried about the cylindrical batteries.  They usually let this pass.  They do not know about the 9 V battery.  I talked to Dr. Servando Snare who is on-call for GI.  He thinks the 9 V is still in the stomach and we will try to retrieve it.  I know these often have a cardboard bottom so they may leak if we do not retrieve them.  Poison control again is still investigating 9 V batteries. ----------------------------------------- 3:19 PM on 01/20/2021 ----------------------------------------- Poison control is worried about the 9v and recommend taking it out Dr. Servando Snare agrees.  And is coming in.  I discussed this all with the patient and he is willing to cooperate               ____________________________________________   FINAL CLINICAL IMPRESSION(S) / ED DIAGNOSES  Final diagnoses:  Schizophrenia, unspecified type (HCC)  Foreign body ingestion, initial encounter  Second  diagnosis is actually 9 V battery ingestion with apparent retained battery in the stomach   ED Discharge Orders     None        Note:  This document was prepared using Dragon voice recognition software and may include unintentional dictation errors.    Arnaldo Natal, MD 01/20/21 [phone]

## 2021-01-20 NOTE — Anesthesia Preprocedure Evaluation (Signed)
Anesthesia Evaluation  Patient identified by MRN, date of birth, ID band Patient awake    Reviewed: Allergy & Precautions, NPO status , Patient's Chart, lab work & pertinent test results  Airway Mallampati: III  TM Distance: >3 FB Neck ROM: Full    Dental no notable dental hx.    Pulmonary neg pulmonary ROS,    Pulmonary exam normal        Cardiovascular negative cardio ROS Normal cardiovascular exam     Neuro/Psych PSYCHIATRIC DISORDERS Schizophrenia negative neurological ROS     GI/Hepatic Neg liver ROS, GERD  ,  Endo/Other  negative endocrine ROS  Renal/GU negative Renal ROS  negative genitourinary   Musculoskeletal negative musculoskeletal ROS (+)   Abdominal   Peds negative pediatric ROS (+)  Hematology negative hematology ROS (+)   Anesthesia Other Findings  Schizoaffective disorder (HCC)  Chronic constipation  GERD (gastroesophageal reflux disease)  Ingestion of foreign body   Reproductive/Obstetrics negative OB ROS                            Anesthesia Physical Anesthesia Plan  ASA: 3 and emergent  Anesthesia Plan: General   Post-op Pain Management:    Induction: Intravenous, Rapid sequence and Cricoid pressure planned  PONV Risk Score and Plan: 2 and Midazolam and Propofol infusion  Airway Management Planned: Oral ETT and Video Laryngoscope Planned  Additional Equipment:   Intra-op Plan:   Post-operative Plan: Extubation in OR  Informed Consent: I have reviewed the patients History and Physical, chart, labs and discussed the procedure including the risks, benefits and alternatives for the proposed anesthesia with the patient or authorized representative who has indicated his/her understanding and acceptance.       Plan Discussed with: CRNA, Anesthesiologist and Surgeon  Anesthesia Plan Comments:         Anesthesia Quick Evaluation

## 2021-01-20 NOTE — ED Notes (Signed)
Night snack and drink given to pt.

## 2021-01-20 NOTE — Transfer of Care (Signed)
Immediate Anesthesia Transfer of Care Note  Patient: Lawrence Morgan  Procedure(s) Performed: ESOPHAGOGASTRODUODENOSCOPY (EGD) WITH PROPOFOL  Patient Location: PACU  Anesthesia Type:General  Level of Consciousness: drowsy  Airway & Oxygen Therapy: Patient Spontanous Breathing and Patient connected to face mask oxygen  Post-op Assessment: Report given to RN  Post vital signs: stable  Last Vitals:  Vitals Value Taken Time  BP    Temp    Pulse 90 01/20/21 1641  Resp 34 01/20/21 1641  SpO2 100 % 01/20/21 1641  Vitals shown include unvalidated device data.  Last Pain:  Vitals:   01/20/21 1531  TempSrc: Temporal  PainSc:          Complications: No notable events documented.

## 2021-01-20 NOTE — ED Provider Notes (Signed)
Patient signed out to me at 3 PM.  In brief he is a 40 year old male with schizophrenia who presents because he was acting aggressively.  He was found to have 3 batteries, 1 in the stomach and 2 likely in the colon.  He was taken to the endoscopy suite with GI who remove the battery from the stomach.  On my evaluation patient is calm in no acute distress.  Tells me that he put toilet paper in his ears.  I was able to remove 2 pieces of toilet paper from the right ear.  He feels like there is something in the left ear but on otoscope exam I only am able to see some wax.  There would be nothing that I would be able to remove.  Discussed with him ENT follow-up.  Currently he is under IVC, Dr. Toni Amend will reassess need for admission.   Georga Hacking, MD 01/20/21 [phone]

## 2021-01-20 NOTE — Anesthesia Procedure Notes (Signed)
Procedure Name: Intubation Date/Time: 01/20/2021 3:42 PM Performed by: Jaye Beagle, CRNA Pre-anesthesia Checklist: Patient identified, Emergency Drugs available, Suction available and Patient being monitored Patient Re-evaluated:Patient Re-evaluated prior to induction Oxygen Delivery Method: Circle system utilized Preoxygenation: Pre-oxygenation with 100% oxygen Induction Type: IV induction Ventilation: Mask ventilation without difficulty Laryngoscope Size: McGraph and 4 Grade View: Grade I Tube type: Oral Number of attempts: 1 Airway Equipment and Method: Stylet and Oral airway Placement Confirmation: ETT inserted through vocal cords under direct vision, positive ETCO2 and breath sounds checked- equal and bilateral Secured at: 23 cm Tube secured with: Tape Dental Injury: Teeth and Oropharynx as per pre-operative assessment

## 2021-01-20 NOTE — ED Triage Notes (Signed)
BIB [facility] from [facility] where patient was assessed.per paperwork, pt schizophrenic and has reported homicidal ideation towards a group of people. Pt agitated, irritable and yelling at RN. Pt does change into behavioral clothing per request while yelling at RN. Pt kept his own black underwear, yelling "yall faggots". Cooperative with blood draw. Pt tells me he does not know what his name is. Psychiatrist at bedside. Shoes, socks, pants and shirt placed into belonging bag. Pt has 2 belonging bags

## 2021-01-20 NOTE — ED Notes (Signed)
When [employer] officer Jamelle Haring wanded patient, the wand continued to alarm under the umbilicus despite nothing being on patient. EDP notified.

## 2021-01-20 NOTE — BH Assessment (Signed)
This Clinical research associate spoke with pt's legal guardian Tawni Carnes [phone] to notify him of pt's arrival and plan of care. Legal guardian verbalized that he'd already been contacted by the director of the group home. Legal guardian reported that the pt has a hx of ingesting objects when upset.

## 2021-01-20 NOTE — BH Assessment (Signed)
This writer attempted to contact pt's legal guardian Tawni Carnes [phone] however there was no answer. A HIPPA compliant voicemail was left, requesting a return phone call.

## 2021-01-21 ENCOUNTER — Encounter: Payer: Self-pay | Admitting: Gastroenterology

## 2021-01-21 ENCOUNTER — Other Ambulatory Visit: Payer: Self-pay

## 2021-01-21 ENCOUNTER — Inpatient Hospital Stay
Admission: AD | Admit: 2021-01-21 | Discharge: 2021-02-17 | DRG: 885 | Disposition: A | Discharge status: Home / Self Care | Payer: Medicare Other | Source: Intra-hospital | Attending: Behavioral Health | Admitting: Behavioral Health

## 2021-01-21 DIAGNOSIS — Z20822 Contact with and (suspected) exposure to covid-19: Secondary | ICD-10-CM | POA: Diagnosis present

## 2021-01-21 DIAGNOSIS — N3281 Overactive bladder: Secondary | ICD-10-CM | POA: Diagnosis present

## 2021-01-21 DIAGNOSIS — Z87821 Personal history of retained foreign body fully removed: Secondary | ICD-10-CM

## 2021-01-21 DIAGNOSIS — G4700 Insomnia, unspecified: Secondary | ICD-10-CM | POA: Diagnosis present

## 2021-01-21 DIAGNOSIS — F250 Schizoaffective disorder, bipolar type: Principal | ICD-10-CM | POA: Diagnosis present

## 2021-01-21 DIAGNOSIS — R45851 Suicidal ideations: Secondary | ICD-10-CM | POA: Diagnosis present

## 2021-01-21 DIAGNOSIS — F17210 Nicotine dependence, cigarettes, uncomplicated: Secondary | ICD-10-CM | POA: Diagnosis present

## 2021-01-21 DIAGNOSIS — R45850 Homicidal ideations: Secondary | ICD-10-CM | POA: Diagnosis present

## 2021-01-21 DIAGNOSIS — F419 Anxiety disorder, unspecified: Secondary | ICD-10-CM | POA: Diagnosis present

## 2021-01-21 DIAGNOSIS — K219 Gastro-esophageal reflux disease without esophagitis: Secondary | ICD-10-CM | POA: Diagnosis present

## 2021-01-21 DIAGNOSIS — K5909 Other constipation: Secondary | ICD-10-CM | POA: Diagnosis present

## 2021-01-21 DIAGNOSIS — T189XXA Foreign body of alimentary tract, part unspecified, initial encounter: Secondary | ICD-10-CM

## 2021-01-21 DIAGNOSIS — Z789 Other specified health status: Secondary | ICD-10-CM

## 2021-01-21 DIAGNOSIS — Z5181 Encounter for therapeutic drug level monitoring: Secondary | ICD-10-CM | POA: Diagnosis not present

## 2021-01-21 DIAGNOSIS — T182XXA Foreign body in stomach, initial encounter: Secondary | ICD-10-CM | POA: Diagnosis present

## 2021-01-21 LAB — URINE DRUG SCREEN, QUALITATIVE (ARMC ONLY)
Amphetamines, Ur Screen: NOT DETECTED
Barbiturates, Ur Screen: NOT DETECTED
Benzodiazepine, Ur Scrn: POSITIVE — AB
Cannabinoid 50 Ng, Ur ~~LOC~~: NOT DETECTED
Cocaine Metabolite,Ur ~~LOC~~: NOT DETECTED
MDMA (Ecstasy)Ur Screen: NOT DETECTED
Methadone Scn, Ur: NOT DETECTED
Opiate, Ur Screen: NOT DETECTED
Phencyclidine (PCP) Ur S: NOT DETECTED
Tricyclic, Ur Screen: NOT DETECTED

## 2021-01-21 LAB — SARS CORONAVIRUS 2 (TAT 6-24 HRS): SARS Coronavirus 2: NEGATIVE

## 2021-01-21 MED ORDER — NICOTINE 14 MG/24HR TD PT24
14.0000 mg | MEDICATED_PATCH | Freq: Every day | TRANSDERMAL | Status: DC
  Administered 2021-01-21 – 2021-01-23 (×2): 14 mg via TRANSDERMAL
  Filled 2021-01-21 (×5): qty 1

## 2021-01-21 MED ORDER — MAGNESIUM HYDROXIDE 400 MG/5ML PO SUSP: 30.0000 mL | Freq: Every day | ORAL | Status: DC | PRN

## 2021-01-21 MED ORDER — DOCUSATE SODIUM 100 MG PO CAPS
200.0000 mg | ORAL_CAPSULE | Freq: Two times a day (BID) | ORAL | Status: DC
  Administered 2021-01-21 – 2021-02-17 (×54): 200 mg via ORAL
  Filled 2021-01-21 (×54): qty 2

## 2021-01-21 MED ORDER — OLANZAPINE 5 MG PO TBDP
15.0000 mg | ORAL_TABLET | Freq: Every day | ORAL | Status: DC
  Administered 2021-01-21 – 2021-01-22 (×2): 15 mg via ORAL
  Filled 2021-01-21 (×2): qty 3

## 2021-01-21 MED ORDER — BENZTROPINE MESYLATE 1 MG PO TABS
0.5000 mg | ORAL_TABLET | Freq: Two times a day (BID) | ORAL | Status: DC
  Administered 2021-01-21 – 2021-02-17 (×54): 0.5 mg via ORAL
  Filled 2021-01-21 (×57): qty 1

## 2021-01-21 MED ORDER — ALUM & MAG HYDROXIDE-SIMETH 200-200-20 MG/5ML PO SUSP: 30.0000 mL | ORAL | Status: DC | PRN

## 2021-01-21 MED ORDER — ZIPRASIDONE MESYLATE 20 MG IM SOLR: 20.0000 mg | Freq: Three times a day (TID) | INTRAMUSCULAR | Status: DC | PRN

## 2021-01-21 MED ORDER — SERTRALINE HCL 100 MG PO TABS
100.0000 mg | ORAL_TABLET | Freq: Every day | ORAL | Status: DC
  Administered 2021-01-22 – 2021-02-17 (×27): 100 mg via ORAL
  Filled 2021-01-21 (×28): qty 1

## 2021-01-21 MED ORDER — LITHIUM CARBONATE ER 300 MG PO TBCR
600.0000 mg | EXTENDED_RELEASE_TABLET | Freq: Every day | ORAL | Status: DC
  Administered 2021-01-21 – 2021-01-28 (×8): 600 mg via ORAL
  Filled 2021-01-21 (×8): qty 2

## 2021-01-21 MED ORDER — PANTOPRAZOLE SODIUM 40 MG PO TBEC
40.0000 mg | DELAYED_RELEASE_TABLET | Freq: Every day | ORAL | Status: DC
  Administered 2021-01-22 – 2021-02-17 (×27): 40 mg via ORAL
  Filled 2021-01-21 (×27): qty 1

## 2021-01-21 MED ORDER — LORAZEPAM 2 MG PO TABS: 2.0000 mg | ORAL_TABLET | Freq: Four times a day (QID) | ORAL | Status: DC | PRN

## 2021-01-21 MED ORDER — CLOZAPINE 25 MG PO TABS
250.0000 mg | ORAL_TABLET | Freq: Every day | ORAL | Status: DC
  Administered 2021-01-21 – 2021-01-24 (×4): 250 mg via ORAL
  Filled 2021-01-21 (×5): qty 2

## 2021-01-21 MED ORDER — HYDROXYZINE HCL 50 MG PO TABS
50.0000 mg | ORAL_TABLET | Freq: Four times a day (QID) | ORAL | Status: DC | PRN
  Administered 2021-01-22 – 2021-02-07 (×4): 50 mg via ORAL
  Filled 2021-01-21 (×6): qty 1

## 2021-01-21 MED ORDER — POLYETHYLENE GLYCOL 3350 17 G PO PACK
17.0000 g | PACK | Freq: Every day | ORAL | Status: DC
  Administered 2021-01-25 – 2021-02-17 (×15): 17 g via ORAL
  Filled 2021-01-21 (×20): qty 1

## 2021-01-21 MED ORDER — OLANZAPINE 10 MG PO TABS
10.0000 mg | ORAL_TABLET | Freq: Four times a day (QID) | ORAL | Status: DC | PRN
  Administered 2021-02-13: 10 mg via ORAL
  Filled 2021-01-21 (×2): qty 1

## 2021-01-21 MED ORDER — ZIPRASIDONE MESYLATE 20 MG IM SOLR: 20.0000 mg | Freq: Two times a day (BID) | INTRAMUSCULAR | Status: DC | PRN

## 2021-01-21 MED ORDER — ACETAMINOPHEN 325 MG PO TABS
650.0000 mg | ORAL_TABLET | Freq: Four times a day (QID) | ORAL | Status: DC | PRN
  Administered 2021-01-22 – 2021-01-29 (×2): 650 mg via ORAL
  Filled 2021-01-21 (×2): qty 2

## 2021-01-21 NOTE — Tx Team (Signed)
Initial Treatment Plan 01/21/2021 4:49 PM Drayk Humbarger SEG:[id number]    PATIENT STRESSORS: Health problems Other: SI/HI/AVH   PATIENT STRENGTHS: Communication skills Motivation for treatment/growth   PATIENT IDENTIFIED PROBLEMS: Ineffective coping skills  Self harm thoughts                   DISCHARGE CRITERIA:  Improved stabilization in mood, thinking, and/or behavior Motivation to continue treatment in a less acute level of care  PRELIMINARY DISCHARGE PLAN: Outpatient therapy Return to previous living arrangement  PATIENT/FAMILY INVOLVEMENT: This treatment plan has been presented to and reviewed with the patient, Lawrence Morgan, and/or family member,.  The patient and family have been given the opportunity to ask questions and make suggestions.  Sharin Mons, RN 01/21/2021, 4:49 PM

## 2021-01-21 NOTE — BH Assessment (Signed)
Patient is to be admitted to [facility] BMU today 01/21/21 by Dr. Toni Amend.  Attending Physician will be Dr. Neale Burly.   Patient has been assigned to room 322, by [facility] Charge Nurse, Ivonne Andrew.    ER staff is aware of the admission: Nitchia, ER Secretary   Dr. Larinda Buttery, ER MD  Annie,Patient's Nurse  Sue Lush, Patient Access.

## 2021-01-21 NOTE — [facility] Group Notes (Signed)
LCSW Group Therapy Note  01/21/2021 3:14 PM  Type of Therapy/Topic:  Group Therapy:  Emotion Regulation  Participation Level:  Did Not Attend   Description of Group:   The purpose of this group is to assist patients in learning to regulate negative emotions and experience positive emotions. Patients will be guided to discuss ways in which they have been vulnerable to their negative emotions. These vulnerabilities will be juxtaposed with experiences of positive emotions or situations, and patients will be challenged to use positive emotions to combat negative ones. Special emphasis will be placed on coping with negative emotions in conflict situations, and patients will process healthy conflict resolution skills.  Therapeutic Goals: Patient will identify two positive emotions or experiences to reflect on in order to balance out negative emotions Patient will label two or more emotions that they find the most difficult to experience Patient will demonstrate positive conflict resolution skills through discussion and/or role plays  Summary of Patient Progress: X  Therapeutic Modalities:   Cognitive Behavioral Therapy Feelings Identification Dialectical Behavioral Therapy   R. Algis Greenhouse, MSW, LCSW, LCAS 01/21/2021 3:14 PM

## 2021-01-21 NOTE — Progress Notes (Signed)
Pt was anxious, restless, fidgety,however also cooperative during admission. Pt forwards little information on what brought him here. Pt is preoccupied with getting longer term treatment and not being able to get a hold of his ACT team. Pt's skin assessment was preformed upon admission. Pt has scattered abrasion on upper torso along with surgical incisions from battery removal. Pt endorses SI w/o a plan while here. Pt also endorses HI however gives no names or details, only that it's not toward anyone here. Pt endorses VH currently stating he sees people who haunt/stock him but currently denies AH.   Pt appears anxious, restless, fidgety, and paranoid.

## 2021-01-21 NOTE — ED Notes (Signed)
Pt not willing to take the miralax but agreeable to taking colace.

## 2021-01-21 NOTE — ED Notes (Signed)
IVC/Consult completed/ Pending Disposition 

## 2021-01-21 NOTE — Progress Notes (Signed)
[facility] MD Progress Note  01/21/2021 11:15 AM Lawrence Morgan  MRN:  [id number] Subjective: Follow-up note for this 40 year old man with schizophrenia who presented yesterday to the emergency room with decompensation.  Patient seen this morning.  He is in the hallway on a stretcher with a blanket over his head but is awake and takes the blanket down to talk with [place].  Affect seems confused like he has trouble putting his thoughts together.  Mentions that he needs to go to a hospital to really get his mind straight.  He asked several times if he could go to the state hospital and it had to be explained to him that we were talking about our local hospital instead.  Patient states that he is not against taking medicine in general but no longer wants to take clozapine.  He was not threatening or aggressive.  Not talking about doing anything violent.  Has been generally cooperative this morning.  Medically cleared from the endoscopy.  COVID-negative Principal Problem: Schizoaffective disorder (HCC) Diagnosis: Principal Problem:   Schizoaffective disorder (HCC) Active Problems:   Chronic constipation   GERD (gastroesophageal reflux disease)   Ingestion of foreign body   Foreign body ingestion, initial encounter  Total Time spent with patient: 30 minutes  Past Psychiatric History: Past history of longstanding schizophrenia and multiple prior hospitalizations.  Has a history of swallowing inanimate objects especially batteries in the past.  Past Medical History: No past medical history on file. History reviewed. No pertinent surgical history. Family History: No family history on file. Family Psychiatric  History: See previous.  He does have a father who is still involved in his life but I was not able to reach him yesterday Social History:  Social History   Substance and Sexual Activity  Alcohol Use None     Social History   Substance and Sexual Activity  Drug Use Not on file    Social History    Socioeconomic History   Marital status: Single    Spouse name: Not on file   Number of children: Not on file   Years of education: Not on file   Highest education level: Not on file  Occupational History   Not on file  Tobacco Use   Smoking status: Not on file   Smokeless tobacco: Not on file  Substance and Sexual Activity   Alcohol use: Not on file   Drug use: Not on file   Sexual activity: Not on file  Other Topics Concern   Not on file  Social History Narrative   Not on file   Social Determinants of Health   Financial Resource Strain: Not on file  Food Insecurity: Not on file  Transportation Needs: Not on file  Physical Activity: Not on file  Stress: Not on file  Social Connections: Not on file   Additional Social History:    Pain Medications: See MAR Prescriptions: See MAR Over the Counter: See MAR History of alcohol / drug use?: No history of alcohol / drug abuse                    Sleep: Fair  Appetite:  Fair  Current Medications: Current Facility-Administered Medications  Medication Dose Route Frequency Provider Last Rate Last Admin   benztropine (COGENTIN) tablet 0.5 mg  0.5 mg Oral BID ,  T, MD   0.5 mg at 01/21/21 9242   cloZAPine (CLOZARIL) tablet 250 mg  250 mg Oral QHS , [name], MD  docusate sodium (COLACE) capsule 200 mg  200 mg Oral BID ,  T, MD   200 mg at 01/21/21 4709   lithium carbonate (LITHOBID) CR tablet 600 mg  600 mg Oral QHS ,  T, MD       LORazepam (ATIVAN) tablet 2 mg  2 mg Oral Q6H PRN ,  T, MD       pantoprazole (PROTONIX) EC tablet 40 mg  40 mg Oral Daily ,  T, MD   40 mg at 01/21/21 6283   polyethylene glycol (MIRALAX / GLYCOLAX) packet 17 g  17 g Oral Daily ,  T, MD       sertraline (ZOLOFT) tablet 100 mg  100 mg Oral Daily ,  T, MD   100 mg at 01/21/21 6629   ziprasidone (GEODON) injection 20 mg  20 mg Intramuscular Q12H PRN ,  [name], MD       Current Outpatient Medications  Medication Sig Dispense Refill   benztropine (COGENTIN) 1 MG tablet Take 1 mg by mouth 2 (two) times daily.     cloZAPine (CLOZARIL) 100 MG tablet Take 500 mg by mouth at bedtime.     famotidine (PEPCID) 20 MG tablet Take 20 mg by mouth daily.     fluvoxaMINE (LUVOX) 50 MG tablet Take 50 mg by mouth at bedtime.     lithium 300 MG tablet Take 600 mg by mouth 2 (two) times daily.     OLANZapine (ZYPREXA) 15 MG tablet Take 15 mg by mouth at bedtime.     oxybutynin (DITROPAN-XL) 10 MG 24 hr tablet Take 10 mg by mouth daily.      Lab Results:  Results for orders placed or performed during the hospital encounter of 01/20/21 (from the past 48 hour(s))  SARS CORONAVIRUS 2 (TAT 6-24 HRS) Nasopharyngeal Nasopharyngeal Swab     Status: None   Collection Time: 01/20/21  1:52 PM   Specimen: Nasopharyngeal Swab  Result Value Ref Range   SARS Coronavirus 2 NEGATIVE NEGATIVE    Comment: (NOTE) SARS-CoV-2 target nucleic acids are NOT DETECTED.  The SARS-CoV-2 RNA is generally detectable in upper and lower respiratory specimens during the acute phase of infection. Negative results do not preclude SARS-CoV-2 infection, do not rule out co-infections with other pathogens, and should not be used as the sole basis for treatment or other patient management decisions. Negative results must be combined with clinical observations, patient history, and epidemiological information. The expected result is Negative.  Fact Sheet for Patients: HairSlick.no  Fact Sheet for Healthcare Providers: quierodirigir.com  This test is not yet approved or cleared by the [place] FDA and  has been authorized for detection and/or diagnosis of SARS-CoV-2 by FDA under an Emergency Use Authorization (EUA). This EUA will remain  in effect (meaning this test can be used) for the duration of the COVID-19 declaration  under Se ction 564(b)(1) of the Act, 21 U.S.C. section 360bbb-3(b)(1), unless the authorization is terminated or revoked sooner.  Performed at [facility], [address], [place], [place] [address]   Acetaminophen level     Status: Abnormal   Collection Time: 01/20/21  2:04 PM  Result Value Ref Range   Acetaminophen (Tylenol), Serum <10 (L) 10 - 30 ug/mL    Comment: (NOTE) Therapeutic concentrations vary significantly. A range of 10-30 ug/mL  may be an effective concentration for many patients. However, some  are best treated at concentrations outside of this range. Acetaminophen concentrations >150 ug/mL at  4 hours after ingestion  and >50 ug/mL at 12 hours after ingestion are often associated with  toxic reactions.  Performed at [facility], [address] [place], [place] [address]   Comprehensive metabolic panel     Status: None   Collection Time: 01/20/21  2:04 PM  Result Value Ref Range   Sodium 141 135 - 145 mmol/L   Potassium 4.0 3.5 - 5.1 mmol/L   Chloride 108 98 - 111 mmol/L   CO2 23 22 - 32 mmol/L   Glucose, Bld 90 70 - 99 mg/dL    Comment: Glucose reference range applies only to samples taken after fasting for at least 8 hours.   BUN 9 6 - 20 mg/dL   Creatinine, Ser 6.01 0.61 - 1.24 mg/dL   Calcium 9.3 8.9 - 09.3 mg/dL   Total Protein 7.4 6.5 - 8.1 g/dL   Albumin 4.7 3.5 - 5.0 g/dL   AST 19 15 - 41 U/L   ALT 14 0 - 44 U/L   Alkaline Phosphatase 73 38 - 126 U/L   Total Bilirubin 0.6 0.3 - 1.2 mg/dL   GFR, Estimated >23 >55 mL/min    Comment: (NOTE) Calculated using the CKD-EPI Creatinine Equation (2021)    Anion gap 10 5 - 15    Comment: Performed at [facility], [address] [place], [place] [address]  Ethanol     Status: None   Collection Time: 01/20/21  2:04 PM  Result Value Ref Range   Alcohol, Ethyl (B) <10 <10 mg/dL    Comment: (NOTE) Lowest detectable limit for serum alcohol is 10 mg/dL.  For medical purposes  only. Performed at [facility], [address] [place], [place] [address]   Salicylate level     Status: Abnormal   Collection Time: 01/20/21  2:04 PM  Result Value Ref Range   Salicylate Lvl <7.0 (L) 7.0 - 30.0 mg/dL    Comment: Performed at [facility], [address] [place], [place] [address]  CBC with Differential     Status: None   Collection Time: 01/20/21  2:04 PM  Result Value Ref Range   WBC 5.7 4.0 - 10.5 K/uL   RBC 5.22 4.22 - 5.81 MIL/uL   Hemoglobin 14.2 13.0 - 17.0 g/dL   HCT 62.8 31.5 - 17.6 %   MCV 84.5 80.0 - 100.0 fL   MCH 27.2 26.0 - 34.0 pg   MCHC 32.2 30.0 - 36.0 g/dL   RDW 16.0 73.7 - 10.6 %   Platelets 206 150 - 400 K/uL   nRBC 0.0 0.0 - 0.2 %   Neutrophils Relative % 52 %   Neutro Abs 3.0 1.7 - 7.7 K/uL   Lymphocytes Relative 39 %   Lymphs Abs 2.2 0.7 - 4.0 K/uL   Monocytes Relative 8 %   Monocytes Absolute 0.5 0.1 - 1.0 K/uL   Eosinophils Relative 0 %   Eosinophils Absolute 0.0 0.0 - 0.5 K/uL   Basophils Relative 1 %   Basophils Absolute 0.0 0.0 - 0.1 K/uL   Immature Granulocytes 0 %   Abs Immature Granulocytes 0.01 0.00 - 0.07 K/uL    Comment: Performed at [facility], [address] [place], [place] [address]  Lithium level     Status: Abnormal   Collection Time: 01/20/21  2:04 PM  Result Value Ref Range   Lithium Lvl <0.06 (L) 0.60 - 1.20 mmol/L    Comment: Performed at [facility], [address] [place], [place] [address]  Urine Drug Screen, Qualitative     Status: Abnormal   Collection Time: 01/21/21  5:03 AM  Result Value Ref Range   Tricyclic, Ur Screen NONE DETECTED NONE DETECTED   Amphetamines, Ur Screen NONE DETECTED NONE DETECTED   MDMA (Ecstasy)Ur Screen NONE DETECTED NONE DETECTED   Cocaine Metabolite,Ur [place] NONE DETECTED NONE DETECTED   Opiate, Ur Screen NONE DETECTED NONE DETECTED   Phencyclidine (PCP) Ur S NONE DETECTED NONE DETECTED   Cannabinoid 50 Ng, Ur [place] NONE DETECTED NONE  DETECTED   Barbiturates, Ur Screen NONE DETECTED NONE DETECTED   Benzodiazepine, Ur Scrn POSITIVE (A) NONE DETECTED   Methadone Scn, Ur NONE DETECTED NONE DETECTED    Comment: (NOTE) Tricyclics + metabolites, urine    Cutoff 1000 ng/mL Amphetamines + metabolites, urine  Cutoff 1000 ng/mL MDMA (Ecstasy), urine              Cutoff 500 ng/mL Cocaine Metabolite, urine          Cutoff 300 ng/mL Opiate + metabolites, urine        Cutoff 300 ng/mL Phencyclidine (PCP), urine         Cutoff 25 ng/mL Cannabinoid, urine                 Cutoff 50 ng/mL Barbiturates + metabolites, urine  Cutoff 200 ng/mL Benzodiazepine, urine              Cutoff 200 ng/mL Methadone, urine                   Cutoff 300 ng/mL  The urine drug screen provides only a preliminary, unconfirmed analytical test result and should not be used for non-medical purposes. Clinical consideration and professional judgment should be applied to any positive drug screen result due to possible interfering substances. A more specific alternate chemical method must be used in order to obtain a confirmed analytical result. Gas chromatography / mass spectrometry (GC/MS) is the preferred confirm atory method. Performed at [facility], [address] [place], [place] [address]     Blood Alcohol level:  Lab Results  Component Value Date   [facility] <10 01/20/2021    Metabolic Disorder Labs: No results found for: HGBA1C, MPG No results found for: PROLACTIN No results found for: CHOL, TRIG, HDL, CHOLHDL, VLDL, LDLCALC  Physical Findings: AIMS:  , ,  ,  ,    CIWA:    COWS:     Musculoskeletal: Strength & Muscle Tone: within normal limits Gait & Station: normal Patient leans: N/A  Psychiatric Specialty Exam:  Presentation  General Appearance:  No data recorded Eye Contact: No data recorded Speech: No data recorded Speech Volume: No data recorded Handedness: No data recorded  Mood and Affect  Mood: No data  recorded Affect: No data recorded  Thought Process  Thought Processes: No data recorded Descriptions of Associations:No data recorded Orientation:No data recorded Thought Content:No data recorded History of Schizophrenia/Schizoaffective disorder:Yes  Duration of Psychotic Symptoms:Greater than six months  Hallucinations:No data recorded Ideas of Reference:No data recorded Suicidal Thoughts:No data recorded Homicidal Thoughts:No data recorded  Sensorium  Memory: No data recorded Judgment: No data recorded Insight: No data recorded  Executive Functions  Concentration: No data recorded Attention Span: No data recorded Recall: No data recorded Fund of Knowledge: No data recorded Language: No data recorded  Psychomotor Activity  Psychomotor Activity: No data recorded  Assets  Assets: No data recorded  Sleep  Sleep: No data recorded   Physical Exam: Physical Exam Vitals and nursing note reviewed.  Constitutional:      Appearance: Normal appearance.  HENT:     Head: Normocephalic and atraumatic.     Mouth/Throat:     Pharynx: Oropharynx is clear.  Eyes:     Pupils: Pupils are equal, round, and reactive to light.  Cardiovascular:     Rate and Rhythm: Normal rate and regular rhythm.  Pulmonary:     Effort: Pulmonary effort is normal.     Breath sounds: Normal breath sounds.  Abdominal:     General: Abdomen is flat.     Palpations: Abdomen is soft.  Musculoskeletal:        General: Normal range of motion.  Skin:    General: Skin is warm and dry.  Neurological:     General: No focal deficit present.     Mental Status: He is alert. Mental status is at baseline.  Psychiatric:        Attention and Perception: He is inattentive.        Mood and Affect: Mood is anxious.        Speech: Speech is tangential.        Behavior: Behavior is withdrawn. Behavior is not agitated, aggressive or hyperactive.        Thought Content: Thought content is paranoid.  Thought content does not include homicidal or suicidal ideation.        Cognition and Memory: Cognition is impaired.        Judgment: Judgment is impulsive.   Review of Systems  Constitutional: Negative.   HENT: Negative.    Eyes: Negative.   Respiratory: Negative.    Cardiovascular: Negative.   Gastrointestinal: Negative.   Musculoskeletal: Negative.   Skin: Negative.   Neurological: Negative.   Psychiatric/Behavioral:  Positive for depression and hallucinations. Negative for substance abuse and suicidal ideas. The patient is nervous/anxious and has insomnia.   Blood pressure (!) 146/81, pulse 74, temperature (!) 97.1 F (36.2 C), resp. rate 18, height  (1.778 m), weight 83.9 kg, SpO2 96 %. Body mass index is 26.54 kg/m.   Treatment Plan Summary: Plan recommend admission to psychiatric hospital for psychosis and poor self-care.  Case reviewed with emergency room physician TTS and inpatient team.  No change to current medications for now.  Encourage patient to ask for what he needs now and to expect probable admission today.  He is agreeable to the plan.  Continue 15-minute checks on admission.  Mordecai Rasmussen, MD 01/21/2021, 11:15 AM

## 2021-01-22 ENCOUNTER — Inpatient Hospital Stay: Payer: Medicare Other

## 2021-01-22 DIAGNOSIS — F250 Schizoaffective disorder, bipolar type: Secondary | ICD-10-CM | POA: Diagnosis not present

## 2021-01-22 LAB — LIPID PANEL
Cholesterol: 176 mg/dL (ref 0–200)
HDL: 47 mg/dL (ref >40)
LDL Cholesterol: 93 mg/dL (ref 0–99)
Total CHOL/HDL Ratio: 3.7 RATIO
Triglycerides: 179 mg/dL — ABNORMAL HIGH (ref <150)
VLDL: 36 mg/dL (ref 0–40)

## 2021-01-22 LAB — HEMOGLOBIN A1C
Hgb A1c MFr Bld: 5.7 % — ABNORMAL HIGH (ref 4.8–5.6)
Mean Plasma Glucose: 116.89 mg/dL

## 2021-01-22 NOTE — Progress Notes (Signed)
Patient calm and compliant during assessment denying SI/HI/AVH. Patient endorses anxiety. Pt observed interacting appropriately with staff and peers on the unit. Pt compliant with medication administration per MD orders. Pt given education, support, and encouragement to be active in his treatment plan. Pt being monitored Q 15 minutes for safety per unit protocol. Pt remains safe on the unit.
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
## 2021-01-22 NOTE — BHH Group Notes (Signed)
LCSW Group Therapy Note  01/22/2021 3:19 PM  Type of Therapy/Topic:  Group Therapy:  Balance in Life  Participation Level:  Active  Description of Group:    This group will address the concept of balance and how it feels and looks when one is unbalanced. Patients will be encouraged to process areas in their lives that are out of balance and identify reasons for remaining unbalanced. Facilitators will guide patients in utilizing problem-solving interventions to address and correct the stressor making their life unbalanced. Understanding and applying boundaries will be explored and addressed for obtaining and maintaining a balanced life. Patients will be encouraged to explore ways to assertively make their unbalanced needs known to significant others in their lives, using other group members and facilitator for support and feedback.  Therapeutic Goals: Patient will identify two or more emotions or situations they have that consume much of in their lives. Patient will identify signs/triggers that life has become out of balance:  Patient will identify two ways to set boundaries in order to achieve balance in their lives:  Patient will demonstrate ability to communicate their needs through discussion and/or role plays  Summary of Patient Progress: Patient was present for group.  Patient was attentive to others.  Patient was intermittently in and out of group.  Patient shared that balance feels like "everything is ok" for him. He reports that when things are off balanced he feels "unwell".  Patient shared that he uses the gym for coping.    Therapeutic Modalities:   Cognitive Behavioral Therapy Solution-Focused Therapy Assertiveness Training  Penni Homans MSW, [place] 01/22/2021 3:19 PM

## 2021-01-22 NOTE — [facility] Suicide Risk Assessment (Signed)
[facility] Admission Suicide Risk Assessment   Nursing information obtained from:  Patient Demographic factors:  Male, Low socioeconomic status, Unemployed Current Mental Status:  NA Loss Factors:  NA Historical Factors:  Prior suicide attempts Risk Reduction Factors:  NA  Total Time spent with patient: 30 minutes Principal Problem: Schizoaffective disorder, bipolar type (HCC) Diagnosis:  Principal Problem:   Schizoaffective disorder, bipolar type (HCC) Active Problems:   Chronic constipation   GERD (gastroesophageal reflux disease)   Ingestion of foreign body  Subjective Data: Patient presents for acute agitation and self harm via swallowing foreign objects. See H&P for full details.   Continued Clinical Symptoms:  Alcohol Use Disorder Identification Test Final Score (AUDIT): 0 The "Alcohol Use Disorders Identification Test", Guidelines for Use in Primary Care, Second Edition.  World Science writer [facility]). Score between 0-7:  no or low risk or alcohol related problems. Score between 8-15:  moderate risk of alcohol related problems. Score between 16-19:  high risk of alcohol related problems. Score 20 or above:  warrants further diagnostic evaluation for alcohol dependence and treatment.   CLINICAL FACTORS:   Severe Anxiety and/or Agitation Schizophrenia:   Paranoid or undifferentiated type Unstable or Poor Therapeutic Relationship Previous Psychiatric Diagnoses and Treatments   Musculoskeletal: Strength & Muscle Tone: within normal limits Gait & Station: normal Patient leans: N/A  Psychiatric Specialty Exam:  Presentation  General Appearance: Appropriate for Environment  Eye Contact:Good  Speech:Clear and Coherent  Speech Volume:Normal  Handedness: No data recorded  Mood and Affect  Mood:Depressed  Affect:Appropriate   Thought Process  Thought Processes:Coherent  Descriptions of Associations:Loose  Orientation:Full (Time, Place and Person)  Thought  Content:WDL  History of Schizophrenia/Schizoaffective disorder:Yes  Duration of Psychotic Symptoms:Greater than six months  Hallucinations:Hallucinations: Auditory; Visual Description of Auditory Hallucinations: "Aliens saying stuff, taunting me" Description of Visual Hallucinations: "Aliens coming out of the walls"  Ideas of Reference:Paranoia  Suicidal Thoughts:Suicidal Thoughts: Yes, Passive (Feels safe on the unit, verbalized contracts for safety) SI Passive Intent and/or Plan: Without Intent  Homicidal Thoughts:Homicidal Thoughts: No (Denies at this time)   Sensorium  Memory:Immediate Good  Judgment:Impaired  Insight:Fair   Executive Functions  Concentration: No data recorded Attention Span:Fair  Recall:Fair  Fund of Knowledge:Fair  Language:Fair   Psychomotor Activity  Psychomotor Activity:Psychomotor Activity: Normal   Assets  Assets:Desire for Improvement; Financial Resources/Insurance; Housing; Resilience; Physical Health; Social Support   Sleep  Sleep:Sleep: Fair Number of Hours of Sleep: 6    Physical Exam: Physical Exam ROS Blood pressure 138/87, pulse 80, temperature 98.8 F (37.1 C), temperature source Oral, resp. rate 17, height 5\' 10"  (1.778 m), weight 90.7 kg, SpO2 99 %. Body mass index is 28.7 kg/m.   COGNITIVE FEATURES THAT CONTRIBUTE TO RISK:  Loss of executive function and Thought constriction (tunnel vision)    SUICIDE RISK:   Mild:  Suicidal ideation of limited frequency, intensity, duration, and specificity.  There are no identifiable plans, no associated intent, mild dysphoria and related symptoms, good self-control (both objective and subjective assessment), few other risk factors, and identifiable protective factors, including available and accessible social support.  PLAN OF CARE: Continue inpatient admission, see H&P for details.   I certify that inpatient services furnished can reasonably be expected to improve the  patient's condition.   , MD 01/22/2021, 1:33 PM

## 2021-01-22 NOTE — Progress Notes (Signed)
Recreation Therapy Notes  Date: 01/15/2021  Time: 9:40 am  Location: Courtyard   Behavioral response: Appropriate   Intervention Topic: Animal Assisted Therapy   Discussion/Intervention:  Animal Assisted Therapy took place today during group.  Animal Assisted Therapy is the planned inclusion of an animal in a patient's treatment plan. The patients were able to engage in therapy with an animal during group. Participants were educated on what a service dog is and the different between a support dog and a service dog. Patient were informed on how animal needs are similar to people needs. Individuals were enlightened on the process to get a service animal or support animal. Patients got the opportunity to pet the animal and were offered emotional support from the animal and staff.  Clinical Observations/Feedback:  Patient came to group late and was on topic and was focused on what peers and staff had to say. Participant shared their experiences and history with animals. Individual was social with peers, staff and animal while participating in group.    LRT/CTRS             01/22/2021 12:15 PM

## 2021-01-22 NOTE — [facility] Suicide Risk Assessment (Signed)
[facility] INPATIENT:  Family/Significant Other Suicide Prevention Education  Suicide Prevention Education:  Education Completed; [name] [phone]), has been identified by the patient as the family member/significant other with whom the patient will be residing, and identified as the person(s) who will aid the patient in the event of a mental health crisis (suicidal ideations/suicide attempt).  With written consent from the patient, the family member/significant other has been provided the following suicide prevention education, prior to the and/or following the discharge of the patient.  The suicide prevention education provided includes the following: Suicide risk factors Suicide prevention and interventions National Suicide Hotline telephone number [facility] assessment telephone number [facility] Emergency Assistance 911 [facility] and/or Residential Mobile Crisis Unit telephone number  Request made of family/significant other to: Remove weapons (e.g., guns, rifles, knives), all items previously/currently identified as safety concern.   Remove drugs/medications (over-the-counter, prescriptions, illicit drugs), all items previously/currently identified as a safety concern.  The family member/significant other verbalizes understanding of the suicide prevention education information provided.  The family member/significant other agrees to remove the items of safety concern listed above.  Gaymon stated that pt was taken off of his long-acting injectable and afterwards became very irritable. He shared that group home coordinator facilitates all of the pt's care needs and gave verbal permission for CSW to contact group home and outpatient providers as needed.   Glenis Smoker 01/22/2021, 3:48 PM

## 2021-01-22 NOTE — H&P (Signed)
Psychiatric Admission Assessment Adult  Patient Identification: Lawrence Morgan MRN:  [id number] Date of Evaluation:  01/22/2021 Chief Complaint:  Schizoaffective disorder, bipolar type (HCC) [F25.0] Principal Diagnosis: Schizoaffective disorder, bipolar type (HCC) Diagnosis:  Principal Problem:   Schizoaffective disorder, bipolar type (HCC) Active Problems:   Chronic constipation   GERD (gastroesophageal reflux disease)   Ingestion of foreign body  History of Present Illness: Lawrence Morgan is a 40 year old male who presented to the ED via [facility] from [facility] where he was threatening to "cut people's heads off" and throwing chairs. At some point in the ED visit, It was discovered that he had swallowed some batteries.  Patient has a history of schizoaffective disorder with several prior admissions, as well as swallowing foreign bodies when he gets agitated.  Patient has a guardian, Lawrence Morgan [phone]) and lives in group home, [facility] in [place].   Chart and labs reviewed.  Patient was seen and evaluated this morning as he was in his room laying down, awake.  Patient is polite and cooperative.  He states "everywhere I go I get picked on."  He says he wants to do better himself and stop ignoring that he needs help.  He would like to go to [place].  He states that he does have schizoaffective disorder and admits to not taking his Clozaril.  He states that he is taking lithium, however he is level is subtherapeutic (less than 0.06).  Patient does not have explanation for that.  Patient states that he does have suicidal ideations and homicidal ideations against "cartoon characters that come out of the walls and talk to me."  Patient also states that he feels that sometimes news reporters have a special messages just for him.  Patient states that he feels safe on the unit and is able to let staff know if he feels like harming himself in any way.  Patient admits to  depression.  Patient denies any nicotine, alcohol, or illicit drug use.  He says his sleep is somewhere between 6 and 12 hours daily.  No unsafe behavior has been noted on the unit this far.  Patient has taken his medication as prescribed.  It is noted that he slept 6 hours last night.  He reports good appetite.  Patient appears to be attending to ADLs, stated that he took a shower.   0930: Writer attempted to contact patient's guardian, Lawrence Morgan [phone]).   No answer, left HIPAA compliant message.  Associated Signs/Symptoms: Depression Symptoms:  depressed mood, Duration of Depression Symptoms: Less than two weeks  (Hypo) Manic Symptoms:  Hallucinations, Anxiety Symptoms:   Concerned about medication effectiveness Psychotic Symptoms:  Delusions, Hallucinations: Auditory Visual Paranoia, PTSD Symptoms: none Total Time spent with patient: 1 hour  Past Psychiatric History: Patient states he has schizoaffective disorder or schizophrenia "for a long time."  Says he has been in state hospitals and in jail.  Is the patient at risk to self? Yes.    Has the patient been a risk to self in the past 6 months? Yes.    Has the patient been a risk to self within the distant past? Yes.    Is the patient a risk to others? Yes.    Has the patient been a risk to others in the past 6 months? Yes.    Has the patient been a risk to others within the distant past? No.   Prior Inpatient Therapy:   Prior Outpatient Therapy:    Alcohol Screening:  1. How often do you have a drink containing alcohol?: Never 2. How many drinks containing alcohol do you have on a typical day when you are drinking?: 1 or 2 3. How often do you have six or more drinks on one occasion?: Never AUDIT-C Score: 0 4. How often during the last year have you found that you were not able to stop drinking once you had started?: Never 5. How often during the last year have you failed to do what was normally expected from you because  of drinking?: Never 6. How often during the last year have you needed a first drink in the morning to get yourself going after a heavy drinking session?: Never 7. How often during the last year have you had a feeling of guilt of remorse after drinking?: Never 8. How often during the last year have you been unable to remember what happened the night before because you had been drinking?: Never 9. Have you or someone else been injured as a result of your drinking?: No 10. Has a relative or friend or a doctor or another health worker been concerned about your drinking or suggested you cut down?: No Alcohol Use Disorder Identification Test Final Score (AUDIT): 0 Substance Abuse History in the last 12 months:  No. Consequences of Substance Abuse: NA Previous Psychotropic Medications: Yes  Psychological Evaluations: No  Past Medical History: History reviewed. No pertinent past medical history.  Past Surgical History:  Procedure Laterality Date   ESOPHAGOGASTRODUODENOSCOPY (EGD) WITH PROPOFOL N/A 01/20/2021   Procedure: ESOPHAGOGASTRODUODENOSCOPY (EGD) WITH PROPOFOL;  Surgeon: Midge Minium, MD;  Location: [facility] ENDOSCOPY;  Service: Endoscopy;  Laterality: N/A;   Family History: History reviewed. No pertinent family history.  Family Psychiatric  History: Patient states that his mother had schizoaffective disorder.  Mother is now deceased.  Tobacco Screening:   Social History:  Social History   Substance and Sexual Activity  Alcohol Use Never     Social History   Substance and Sexual Activity  Drug Use Never    Additional Social History:   Allergies:   Allergies  Allergen Reactions   Haldol [Haloperidol Lactate] Other (See Comments)    Unknown   Shellfish Allergy Itching and Rash   Lab Results:  Results for orders placed or performed during the hospital encounter of 01/20/21 (from the past 48 hour(s))  SARS CORONAVIRUS 2 (TAT 6-24 HRS) Nasopharyngeal Nasopharyngeal Swab     Status:  None   Collection Time: 01/20/21  1:52 PM   Specimen: Nasopharyngeal Swab  Result Value Ref Range   SARS Coronavirus 2 NEGATIVE NEGATIVE    Comment: (NOTE) SARS-CoV-2 target nucleic acids are NOT DETECTED.  The SARS-CoV-2 RNA is generally detectable in upper and lower respiratory specimens during the acute phase of infection. Negative results do not preclude SARS-CoV-2 infection, do not rule out co-infections with other pathogens, and should not be used as the sole basis for treatment or other patient management decisions. Negative results must be combined with clinical observations, patient history, and epidemiological information. The expected result is Negative.  Fact Sheet for Patients: HairSlick.no  Fact Sheet for Healthcare Providers: quierodirigir.com  This test is not yet approved or cleared by the [place] FDA and  has been authorized for detection and/or diagnosis of SARS-CoV-2 by FDA under an Emergency Use Authorization (EUA). This EUA will remain  in effect (meaning this test can be used) for the duration of the COVID-19 declaration under Se ction 564(b)(1) of the Act, 21 U.S.C.  section 360bbb-3(b)(1), unless the authorization is terminated or revoked sooner.  Performed at [facility], [address] [place], [place] [address]   Acetaminophen level     Status: Abnormal   Collection Time: 01/20/21  2:04 PM  Result Value Ref Range   Acetaminophen (Tylenol), Serum <10 (L) 10 - 30 ug/mL    Comment: (NOTE) Therapeutic concentrations vary significantly. A range of 10-30 ug/mL  may be an effective concentration for many patients. However, some  are best treated at concentrations outside of this range. Acetaminophen concentrations >150 ug/mL at 4 hours after ingestion  and >50 ug/mL at 12 hours after ingestion are often associated with  toxic reactions.  Performed at [facility], [address] [place], [place] [address]   Comprehensive metabolic panel     Status: None   Collection Time: 01/20/21  2:04 PM  Result Value Ref Range   Sodium 141 135 - 145 mmol/L   Potassium 4.0 3.5 - 5.1 mmol/L   Chloride 108 98 - 111 mmol/L   CO2 23 22 - 32 mmol/L   Glucose, Bld 90 70 - 99 mg/dL    Comment: Glucose reference range applies only to samples taken after fasting for at least 8 hours.   BUN 9 6 - 20 mg/dL   Creatinine, Ser 1.54 0.61 - 1.24 mg/dL   Calcium 9.3 8.9 - 00.8 mg/dL   Total Protein 7.4 6.5 - 8.1 g/dL   Albumin 4.7 3.5 - 5.0 g/dL   AST 19 15 - 41 U/L   ALT 14 0 - 44 U/L   Alkaline Phosphatase 73 38 - 126 U/L   Total Bilirubin 0.6 0.3 - 1.2 mg/dL   GFR, Estimated >67 >61 mL/min    Comment: (NOTE) Calculated using the CKD-EPI Creatinine Equation (2021)    Anion gap 10 5 - 15    Comment: Performed at [facility], [address] [place], [place] [address]  Ethanol     Status: None   Collection Time: 01/20/21  2:04 PM  Result Value Ref Range   Alcohol, Ethyl (B) <10 <10 mg/dL    Comment: (NOTE) Lowest detectable limit for serum alcohol is 10 mg/dL.  For medical purposes only. Performed at [facility], [address] [place], [place] [address]   Salicylate level     Status: Abnormal   Collection Time: 01/20/21  2:04 PM  Result Value Ref Range   Salicylate Lvl <7.0 (L) 7.0 - 30.0 mg/dL    Comment: Performed at [facility], [address] [place], [place] [address]  CBC with Differential     Status: None   Collection Time: 01/20/21  2:04 PM  Result Value Ref Range   WBC 5.7 4.0 - 10.5 K/uL   RBC 5.22 4.22 - 5.81 MIL/uL   Hemoglobin 14.2 13.0 - 17.0 g/dL   HCT 98.3 38.2 - 50.5 %   MCV 84.5 80.0 - 100.0 fL   MCH 27.2 26.0 - 34.0 pg   MCHC 32.2 30.0 - 36.0 g/dL   RDW 39.7 67.3 - 41.9 %   Platelets 206 150 - 400 K/uL   nRBC 0.0 0.0 - 0.2 %   Neutrophils Relative % 52 %   Neutro Abs 3.0 1.7 - 7.7 K/uL   Lymphocytes  Relative 39 %   Lymphs Abs 2.2 0.7 - 4.0 K/uL   Monocytes Relative 8 %   Monocytes Absolute 0.5 0.1 - 1.0 K/uL   Eosinophils Relative 0 %  Eosinophils Absolute 0.0 0.0 - 0.5 K/uL   Basophils Relative 1 %   Basophils Absolute 0.0 0.0 - 0.1 K/uL   Immature Granulocytes 0 %   Abs Immature Granulocytes 0.01 0.00 - 0.07 K/uL    Comment: Performed at [facility], [address] [place], [place] [address]  Lithium level     Status: Abnormal   Collection Time: 01/20/21  2:04 PM  Result Value Ref Range   Lithium Lvl <0.06 (L) 0.60 - 1.20 mmol/L    Comment: Performed at [facility], [address], [place], [place] [address]  Urine Drug Screen, Qualitative     Status: Abnormal   Collection Time: 01/21/21  5:03 AM  Result Value Ref Range   Tricyclic, Ur Screen NONE DETECTED NONE DETECTED   Amphetamines, Ur Screen NONE DETECTED NONE DETECTED   MDMA (Ecstasy)Ur Screen NONE DETECTED NONE DETECTED   Cocaine Metabolite,Ur [place] NONE DETECTED NONE DETECTED   Opiate, Ur Screen NONE DETECTED NONE DETECTED   Phencyclidine (PCP) Ur S NONE DETECTED NONE DETECTED   Cannabinoid 50 Ng, Ur [place] NONE DETECTED NONE DETECTED   Barbiturates, Ur Screen NONE DETECTED NONE DETECTED   Benzodiazepine, Ur Scrn POSITIVE (A) NONE DETECTED   Methadone Scn, Ur NONE DETECTED NONE DETECTED    Comment: (NOTE) Tricyclics + metabolites, urine    Cutoff 1000 ng/mL Amphetamines + metabolites, urine  Cutoff 1000 ng/mL MDMA (Ecstasy), urine              Cutoff 500 ng/mL Cocaine Metabolite, urine          Cutoff 300 ng/mL Opiate + metabolites, urine        Cutoff 300 ng/mL Phencyclidine (PCP), urine         Cutoff 25 ng/mL Cannabinoid, urine                 Cutoff 50 ng/mL Barbiturates + metabolites, urine  Cutoff 200 ng/mL Benzodiazepine, urine              Cutoff 200 ng/mL Methadone, urine                   Cutoff 300 ng/mL  The urine drug screen provides only a preliminary, unconfirmed analytical test  result and should not be used for non-medical purposes. Clinical consideration and professional judgment should be applied to any positive drug screen result due to possible interfering substances. A more specific alternate chemical method must be used in order to obtain a confirmed analytical result. Gas chromatography / mass spectrometry (GC/MS) is the preferred confirm atory method. Performed at [facility], [address] [place], [place] [address]     Blood Alcohol level:  Lab Results  Component Value Date   [facility] <10 01/20/2021    Metabolic Disorder Labs:  No results found for: HGBA1C, MPG No results found for: PROLACTIN No results found for: CHOL, TRIG, HDL, CHOLHDL, VLDL, LDLCALC  Current Medications: Current Facility-Administered Medications  Medication Dose Route Frequency Provider Last Rate Last Admin   acetaminophen (TYLENOL) tablet 650 mg  650 mg Oral Q6H PRN [name], [name], MD       alum & mag hydroxide-simeth (MAALOX/MYLANTA) 200-200-20 MG/5ML suspension 30 mL  30 mL Oral Q4H PRN [name], [name], MD       benztropine (COGENTIN) tablet 0.5 mg  0.5 mg Oral BID [name], [name], MD   0.5 mg at 01/22/21 0805   cloZAPine (CLOZARIL) tablet 250 mg  250 mg Oral  QHS [name], Jackquline DenmarkJohn T, MD   250 mg at 01/21/21 2105   docusate sodium (COLACE) capsule 200 mg  200 mg Oral BID [name], [name], MD   200 mg at 01/22/21 0805   hydrOXYzine (ATARAX/VISTARIL) tablet 50 mg  50 mg Oral Q6H PRN [name], Jackquline DenmarkJohn T, MD   50 mg at 01/22/21 0804   lithium carbonate (LITHOBID) CR tablet 600 mg  600 mg Oral QHS [name], [name], MD   600 mg at 01/21/21 2106   LORazepam (ATIVAN) tablet 2 mg  2 mg Oral Q6H PRN [name], Jackquline DenmarkJohn T, MD       magnesium hydroxide (MILK OF MAGNESIA) suspension 30 mL  30 mL Oral Daily PRN [name], [name], MD       nicotine (NICODERM CQ - dosed in mg/24 hours) patch 14 mg  14 mg Transdermal Daily [name], Megan M, MD   14 mg at 01/21/21 1712   OLANZapine (ZYPREXA) tablet  10 mg  10 mg Oral Q6H PRN [name], Megan M, MD       OLANZapine zydis (ZYPREXA) disintegrating tablet 15 mg  15 mg Oral QHS [name], Megan M, MD   15 mg at 01/21/21 2107   pantoprazole (PROTONIX) EC tablet 40 mg  40 mg Oral Daily [name], [name], MD   40 mg at 01/22/21 0805   polyethylene glycol (MIRALAX / GLYCOLAX) packet 17 g  17 g Oral Daily [name], Jackquline DenmarkJohn T, MD       sertraline (ZOLOFT) tablet 100 mg  100 mg Oral Daily [name], [name], MD   100 mg at 01/22/21 0804   ziprasidone (GEODON) injection 20 mg  20 mg Intramuscular Q8H PRN [name], Megan M, MD       PTA Medications: Medications Prior to Admission  Medication Sig Dispense Refill Last Dose   benztropine (COGENTIN) 1 MG tablet Take 1 mg by mouth 2 (two) times daily.      cloZAPine (CLOZARIL) 100 MG tablet Take 500 mg by mouth at bedtime.      famotidine (PEPCID) 20 MG tablet Take 20 mg by mouth daily.      fluvoxaMINE (LUVOX) 50 MG tablet Take 50 mg by mouth at bedtime.      lithium 300 MG tablet Take 600 mg by mouth 2 (two) times daily.      OLANZapine (ZYPREXA) 15 MG tablet Take 15 mg by mouth at bedtime.      oxybutynin (DITROPAN-XL) 10 MG 24 hr tablet Take 10 mg by mouth daily.       Musculoskeletal: Strength & Muscle Tone: within normal limits Gait & Station: normal Patient leans: N/A    Psychiatric Specialty Exam:  Presentation  General Appearance:  Appropriate for Environment Eye Contact: Good Speech: Clear and Coherent Speech Volume: Normal Handedness: No data recorded  Mood and Affect  Mood: Depressed Affect: Appropriate  Thought Process  Thought Processes: Coherent Duration of Psychotic Symptoms: Greater than six months  Past Diagnosis of Schizophrenia or Psychoactive disorder: Yes  Descriptions of Associations:Loose Orientation:Full (Time, Place and Person) Thought Content:WDL Hallucinations:Hallucinations: Auditory; Visual Description of Auditory Hallucinations: "Aliens saying stuff,  taunting me" Description of Visual Hallucinations: "Aliens coming out of the walls" Ideas of Reference:Paranoia Suicidal Thoughts:Suicidal Thoughts: Yes, Passive (Feels safe on the unit, verbalized contracts for safety) SI Passive Intent and/or Plan: Without Intent Homicidal Thoughts:Homicidal Thoughts: No (Denies at this time)  Sensorium  Memory: Immediate Good Judgment: Impaired Insight: Fair  Art therapistxecutive Functions  Concentration: No data recorded Attention Span:  Fair Recall: Jennelle Human of Knowledge: Fair Language: Fair  Psychomotor Activity  Psychomotor Activity: Psychomotor Activity: Normal  Assets  Assets: Desire for Improvement; Financial Resources/Insurance; Housing; Resilience; Physical Health; Social Support  Sleep  Sleep: Sleep: Fair Number of Hours of Sleep: 6   Physical Exam: Physical Exam Vitals and nursing note reviewed.  HENT:     Head: Normocephalic.     Nose: No congestion or rhinorrhea.  Eyes:     General:        Right eye: No discharge.        Left eye: No discharge.  Cardiovascular:     Rate and Rhythm: Normal rate.  Pulmonary:     Effort: Pulmonary effort is normal.  Musculoskeletal:        General: Normal range of motion.     Cervical back: Normal range of motion.  Neurological:     Mental Status: He is alert and oriented to person, place, and time.  Psychiatric:        Attention and Perception: Attention normal.        Mood and Affect: Mood is depressed.        Speech: Speech normal.        Behavior: Behavior is cooperative.        Thought Content: Thought content is paranoid. Thought content includes suicidal (Passive) ideation.        Cognition and Memory: Cognition is impaired.        Judgment: Judgment is impulsive.   Review of Systems  Constitutional:        States that he is "achy" from procedure to remove batteries that he swallowed  Musculoskeletal:        General achiness  Psychiatric/Behavioral:  Positive for  depression, hallucinations and suicidal ideas (Passive, verbally contracts for safety). Negative for memory loss and substance abuse. The patient is not nervous/anxious and does not have insomnia.   All other systems reviewed and are negative. Blood pressure 138/87, pulse 80, temperature 98.8 F (37.1 C), temperature source Oral, resp. rate 17, height  (1.778 m), weight 90.7 kg, SpO2 99 %. Body mass index is 28.7 kg/m.  Treatment Plan Summary: Daily contact with patient to assess and evaluate symptoms and progress in treatment and Medication management.  Patient with schizoaffective disorder bipolar type with history of several hospitalizations.  Admitted for aggressive behavior, auditory and visual hallucinations in the setting of noncompliance with medications.  Patient also swallowed batteries and required interventions for removal.  Not all batteries were able to be removed. KUB x-ray obtained today to ascertain battery location.  Schizoaffective disorder/psychosis - Continue Clozapine tablet 250 mg oral daily at bedtime - Continue benzatropine tablet 0.5 mg oral 2 times daily prophylaxis for EPS -Continue olanzapine Zydis 15 mg oral daily at bedtime  Mood stabilization/depression - Continue sertraline tablet 100 mg oral daily - Continue lithium carbonate CR tablet 600 mg oral daily at bedtime  Chronic constipation - Continue docusate sodium capsule 200 mg oral 2 times daily - Continue MiraLAX 17 g oral daily  GERD - Continue Protonix EC tablet 40 mg daily  Anxiety/agitation - Continue hydroxyzine tablet 50 mg oral every 6 hours as needed - Continue lorazepam tablet 2 mg oral every 6 hours as needed for anxiety or sedation  - Continue olanzapine tablet 10 mg oral every 6 hours as needed for agitation - Continue ziprasidone injection 20 mg IM every 8 hours as needed for agitation  Smoking cessation/nicotine replacement - Continue nicotine  transdermal patch 14 mg  daily  PRN, Other  --Continue Tylenol 650 mg po every 6 hrs prn pain --Continue MAALOX/MYLANTA 30 mL po every 4 hrs prn indigestion --Continue Milk of Magnesia 30 mL po daily prn constipation      Observation Level/Precautions:  15 minute checks  Laboratory:   Done in ED  Psychotherapy: Psychosocial groups  Medications: See [facility]  Consultations: GI, x-ray done.  Further consultation as needed  Discharge Concerns: Safety and stability  Estimated LOS: 5 to 7 days  Other:     Physician Treatment Plan for Primary Diagnosis: Schizoaffective disorder, bipolar type (HCC) Long Term Goal(s): Improvement in symptoms so as ready for discharge  Short Term Goals: Ability to identify changes in lifestyle to reduce recurrence of condition will improve, Ability to disclose and discuss suicidal ideas, Ability to demonstrate self-control will improve, Ability to identify and develop effective coping behaviors will improve, Ability to maintain clinical measurements within normal limits will improve, and Compliance with prescribed medications will improve  Physician Treatment Plan for Secondary Diagnosis: Principal Problem:   Schizoaffective disorder, bipolar type (HCC) Active Problems:   Chronic constipation   GERD (gastroesophageal reflux disease)   Ingestion of foreign body  Long Term Goal(s): Improvement in symptoms so as ready for discharge  Short Term Goals: Ability to identify changes in lifestyle to reduce recurrence of condition will improve, Ability to disclose and discuss suicidal ideas, Ability to demonstrate self-control will improve, Ability to identify and develop effective coping behaviors will improve, Ability to maintain clinical measurements within normal limits will improve, and Compliance with prescribed medications will improve  I certify that inpatient services furnished can reasonably be expected to improve the patient's condition.    [name], NP 8/25/202210:36 AM

## 2021-01-22 NOTE — [facility] Suicide Risk Assessment (Addendum)
CSW contacted [facility] and spoke with Lawrence Morgan [phone]) at 1:26 PM. Lawrence Morgan shared that pt receives medication management through [facility] and gave CSW contact information for team lead, Lawrence Morgan [phone]). He states that his daughter is the coordinator for the group home and gave her information as well, Lawrence Morgan [phone]). Lawrence Morgan stated that they utilize [facility] in [place], [place] [phone]) for medication. Pt has primary care through [facility] [phone]). Lawrence Morgan requested that FL2 be sent with pt upon discharge. No other concerns expressed. Contact ended without incident.    CSW contacted Lawrence Morgan of [facility] to notify of pt's admission at 1:32 PM. She stated that Dr. Audley Hose requested a Clozaril level for pt. CSW stated that provider would be notified. Gina asked to be kept updated about pt. CSW agreed. No other concerns expressed. Contact ended without incident.   Vilma Meckel. Algis Greenhouse, MSW, LCSW, LCAS 01/22/2021 3:58 PM

## 2021-01-22 NOTE — Plan of Care (Signed)
Pt in the milieu with peers. Pt cooperative. Pt pacing a lot before and after med pass. Pt denies SI/HI/AV. Pt cautious and answers questions after a pause. Q15 safety checks maintained.  Problem: Education: Goal: Ability to state activities that reduce stress will improve Outcome: Progressing   Problem: Coping: Goal: Ability to identify and develop effective coping behavior will improve Outcome: Progressing   Problem: Self-Concept: Goal: Ability to identify factors that promote anxiety will improve Outcome: Progressing Goal: Level of anxiety will decrease Outcome: Progressing Goal: Ability to modify response to factors that promote anxiety will improve Outcome: Progressing   Problem: Education: Goal: Utilization of techniques to improve thought processes will improve Outcome: Progressing Goal: Knowledge of the prescribed therapeutic regimen will improve Outcome: Progressing   Problem: Activity: Goal: Interest or engagement in leisure activities will improve Outcome: Progressing Goal: Imbalance in normal sleep/wake cycle will improve Outcome: Progressing   Problem: Coping: Goal: Coping ability will improve Outcome: Progressing Goal: Will verbalize feelings Outcome: Progressing   Problem: Health Behavior/Discharge Planning: Goal: Ability to make decisions will improve Outcome: Progressing Goal: Compliance with therapeutic regimen will improve Outcome: Progressing   Problem: Role Relationship: Goal: Will demonstrate positive changes in social behaviors and relationships Outcome: Progressing   Problem: Safety: Goal: Ability to disclose and discuss suicidal ideas will improve Outcome: Progressing Goal: Ability to identify and utilize support systems that promote safety will improve Outcome: Progressing   Problem: Self-Concept: Goal: Will verbalize positive feelings about self Outcome: Progressing Goal: Level of anxiety will decrease Outcome: Progressing   Problem:  Education: Goal: Knowledge of [facility] Education information/materials will improve Outcome: Progressing Goal: Emotional status will improve Outcome: Progressing Goal: Mental status will improve Outcome: Progressing Goal: Verbalization of understanding the information provided will improve Outcome: Progressing   Problem: Activity: Goal: Interest or engagement in activities will improve Outcome: Progressing Goal: Sleeping patterns will improve Outcome: Progressing   Problem: Coping: Goal: Ability to verbalize frustrations and anger appropriately will improve Outcome: Progressing Goal: Ability to demonstrate self-control will improve Outcome: Progressing   Problem: Health Behavior/Discharge Planning: Goal: Identification of resources available to assist in meeting health care needs will improve Outcome: Progressing Goal: Compliance with treatment plan for underlying cause of condition will improve Outcome: Progressing   Problem: Physical Regulation: Goal: Ability to maintain clinical measurements within normal limits will improve Outcome: Progressing   Problem: Safety: Goal: Periods of time without injury will increase Outcome: Progressing

## 2021-01-22 NOTE — Plan of Care (Signed)
Patient endorses anxiety  Problem: Self-Concept: Goal: Level of anxiety will decrease Outcome: Not Progressing   

## 2021-01-22 NOTE — Plan of Care (Signed)
Patient alert and oriented during assessment.  Patient denies SI and contracts for safety.  Patient did verbalize passive HI "I sometimes think about decapitating other people anyone I think about". Patient verbalized Depression and Anxiety 7/10. Patient did verbalize seeing cartoon characters at times. Emotional support and encouragement offered.  Frequent verbal contact. Observed patient active in milieu and participates in group therapy. Patient compliant with scheduled medications.  Patient denies any adverse reactions to medications administered. Will continue to monitor patient on unit during shift.   Problem: Coping: Goal: Ability to identify and develop effective coping behavior will improve Outcome: Progressing   Problem: Self-Concept: Goal: Level of anxiety will decrease Outcome: Progressing

## 2021-01-23 DIAGNOSIS — F250 Schizoaffective disorder, bipolar type: Secondary | ICD-10-CM | POA: Diagnosis not present

## 2021-01-23 MED ORDER — OLANZAPINE 5 MG PO TBDP
20.0000 mg | ORAL_TABLET | Freq: Every day | ORAL | Status: DC
  Administered 2021-01-23 – 2021-01-25 (×3): 20 mg via ORAL
  Filled 2021-01-23 (×3): qty 4

## 2021-01-23 NOTE — Progress Notes (Signed)
Recreation Therapy Notes  INPATIENT RECREATION THERAPY ASSESSMENT  Patient Details Name: Theon Sobotka MRN: [id number] DOB: [birth year] Today's Date: 01/23/2021       Information Obtained From: Patient  Able to Participate in Assessment/Interview: Yes  Patient Presentation: Responsive  Reason for Admission (Per Patient): Active Symptoms  Patient Stressors:    Coping Skills:   Other (Comment) (Trips, Smoke)  Leisure Interests (2+):  Crafts - Other (Comment), Games - Video games English as a second language teacher, Scientist, research (physical sciences), Leisure centre manager)  Frequency of Recreation/Participation: Monthly  Awareness of Community Resources:  No  Community Resources:     Current Use:    If no, Barriers?:    Expressed Interest in [employer] Information: No  [employer] of Residence:  Film/video editor  Patient Main Form of Transportation: Other (Comment) (Group home)  Patient Strengths:  Helpful to others, Thoughtful  Patient Identified Areas of Improvement:  Get to the bottom of my problems  Patient Goal for Hospitalization:  Better myself  Current SI (including self-harm):  No  Current HI:  Yes (Cartoon images)  Current AVH: Yes (Cartoon images)  Staff Intervention Plan: Group Attendance, Collaborate with Interdisciplinary Treatment Team  Consent to Intern Participation: N/A     01/23/2021, 2:38 PM

## 2021-01-23 NOTE — Progress Notes (Addendum)
Recreation Therapy Notes   Date: 01/23/2021  Time: 9:30 am   Location: Courtyard   Behavioral response: N/A   Intervention Topic: Leisure   Discussion/Intervention: Patient did not attend group.   Clinical Observations/Feedback:  Patient did not attend group.     LRT/CTRS           01/23/2021 11:47 AM

## 2021-01-23 NOTE — Progress Notes (Signed)
Recreation Therapy Notes  INPATIENT RECREATION TR PLAN  Patient Details Name: Lawrence Morgan MRN: [id number] DOB: [birth year] Today's Date: 01/23/2021  Rec Therapy Plan Is patient appropriate for Therapeutic Recreation?: Yes Treatment times per week: at least 3 Estimated Length of Stay: 5-7 days TR Treatment/Interventions: Group participation (Comment)  Discharge Criteria Pt will be discharged from therapy if:: Discharged Treatment plan/goals/alternatives discussed and agreed upon by:: Patient/family  Discharge Summary        01/23/2021, 2:39 PM

## 2021-01-23 NOTE — [facility] Counselor (Signed)
LCSW Group Therapy Note  01/23/2021 3:15 PM  Type of Therapy and Topic:  Group Therapy:  Feelings around Relapse and Recovery  Participation Level:  Active   Description of Group:    Patients in this group will discuss emotions they experience before and after a relapse. They will process how experiencing these feelings, or avoidance of experiencing them, relates to having a relapse. Facilitator will guide patients to explore emotions they have related to recovery. Patients will be encouraged to process which emotions are more powerful. They will be guided to discuss the emotional reaction significant others in their lives may have to their relapse or recovery. Patients will be assisted in exploring ways to respond to the emotions of others without this contributing to a relapse.  Therapeutic Goals: Patient will identify two or more emotions that lead to a relapse for them Patient will identify two emotions that result when they relapse Patient will identify two emotions related to recovery Patient will demonstrate ability to communicate their needs through discussion and/or role plays   Summary of Patient Progress: Pt was present for the entirety of group. He was actively involved in the discussion although at time off topic. He was easily redirected.   Therapeutic Modalities:   Cognitive Behavioral Therapy Solution-Focused Therapy Assertiveness Training Relapse Prevention Therapy   Simona Huh R. Algis Greenhouse, MSW, LCSW, LCAS 01/23/2021 3:15 PM

## 2021-01-23 NOTE — Progress Notes (Signed)
[facility] MD Progress Note  01/23/2021 10:28 AM Lawrence Morgan  MRN:  [id number]  CC "I need long-term treatment at [facility]."  Subjective:  40 year old male with schizoaffective disorder, bipolar type presenting for aggression at group home and homicidal ideations. He had also swallowed three batteries, 1 one which was removed by endoscopy. KUB yesterday shows that the other two batteries are traversing the intestines and are now located in the pelvis without obstruction. Will follow XRAY until passed. No acute events overnight, medication compliant, and attending to ADLs. Patient seen during treatment team and again one-on-one at bedside. He continues to ruminate on desire for long-term treatment for 8 months at [facility]. He is somewhat upset when told he does not meet criteria to transfer to a state hospital. He continues to endorse auditory hallucinations of voices taunting him, and visual hallucinations of cartoon characters coming out of the wall. He has passive suicidal ideations, and denies homicidal ideations. He continues to request that we do not increase his clozapine. Clozapine level was drawn and pending. For now will increase olanzapine for continued paranoia and hallucinations.   Principal Problem: Schizoaffective disorder, bipolar type (HCC) Diagnosis: Principal Problem:   Schizoaffective disorder, bipolar type (HCC) Active Problems:   Chronic constipation   GERD (gastroesophageal reflux disease)   Ingestion of foreign body  Total Time spent with patient: 30 minutes  Past Psychiatric History: See H&P  Past Medical History: History reviewed. No pertinent past medical history.  Past Surgical History:  Procedure Laterality Date   ESOPHAGOGASTRODUODENOSCOPY (EGD) WITH PROPOFOL N/A 01/20/2021   Procedure: ESOPHAGOGASTRODUODENOSCOPY (EGD) WITH PROPOFOL;  Surgeon: [name], Darren, MD;  Location: [facility] ENDOSCOPY;  Service: Endoscopy;  Laterality: N/A;   Family History: History  reviewed. No pertinent family history. Family Psychiatric  History: See H&P Social History:  Social History   Substance and Sexual Activity  Alcohol Use Never     Social History   Substance and Sexual Activity  Drug Use Never    Social History   Socioeconomic History   Marital status: Single    Spouse name: Not on file   Number of children: Not on file   Years of education: Not on file   Highest education level: Not on file  Occupational History   Not on file  Tobacco Use   Smoking status: Every Day    Packs/day: 1.50    Types: Cigarettes   Smokeless tobacco: Never  Vaping Use   Vaping Use: Some days   Substances: Nicotine  Substance and Sexual Activity   Alcohol use: Never   Drug use: Never   Sexual activity: Not Currently  Other Topics Concern   Not on file  Social History Narrative   Not on file   Social Determinants of Health   Financial Resource Strain: Not on file  Food Insecurity: Not on file  Transportation Needs: Not on file  Physical Activity: Not on file  Stress: Not on file  Social Connections: Not on file   Additional Social History:                         Sleep: Fair  Appetite:  Fair  Current Medications: Current Facility-Administered Medications  Medication Dose Route Frequency Provider Last Rate Last Admin   acetaminophen (TYLENOL) tablet 650 mg  650 mg Oral Q6H PRN [name], [name], MD   650 mg at 01/22/21 1249   alum & mag hydroxide-simeth (MAALOX/MYLANTA) 200-200-20 MG/5ML suspension 30 mL  30  mL Oral Q4H PRN [name], [name], MD       benztropine (COGENTIN) tablet 0.5 mg  0.5 mg Oral BID [name], [name], MD   0.5 mg at 01/23/21 0758   cloZAPine (CLOZARIL) tablet 250 mg  250 mg Oral QHS [name], [name], MD   250 mg at 01/22/21 2113   docusate sodium (COLACE) capsule 200 mg  200 mg Oral BID [name], [name], MD   200 mg at 01/23/21 0758   hydrOXYzine (ATARAX/VISTARIL) tablet 50 mg  50 mg Oral Q6H PRN [name], [name], MD   50 mg  at 01/22/21 0804   lithium carbonate (LITHOBID) CR tablet 600 mg  600 mg Oral QHS [name], [name], MD   600 mg at 01/22/21 2114   magnesium hydroxide (MILK OF MAGNESIA) suspension 30 mL  30 mL Oral Daily PRN [name], [name], MD       nicotine (NICODERM CQ - dosed in mg/24 hours) patch 14 mg  14 mg Transdermal Daily Jesse Sans, MD   14 mg at 01/23/21 0801   OLANZapine (ZYPREXA) tablet 10 mg  10 mg Oral Q6H PRN Jesse Sans, MD       OLANZapine zydis (ZYPREXA) disintegrating tablet 20 mg  20 mg Oral QHS Jesse Sans, MD       pantoprazole (PROTONIX) EC tablet 40 mg  40 mg Oral Daily [name], [name], MD   40 mg at 01/23/21 0758   polyethylene glycol (MIRALAX / GLYCOLAX) packet 17 g  17 g Oral Daily [name], [name], MD       sertraline (ZOLOFT) tablet 100 mg  100 mg Oral Daily [name], [name], MD   100 mg at 01/23/21 0758   ziprasidone (GEODON) injection 20 mg  20 mg Intramuscular Q8H PRN Jesse Sans, MD        Lab Results:  Results for orders placed or performed during the hospital encounter of 01/21/21 (from the past 48 hour(s))  Lipid panel     Status: Abnormal   Collection Time: 01/22/21  2:02 PM  Result Value Ref Range   Cholesterol 176 0 - 200 mg/dL   Triglycerides 973 (H) <150 mg/dL   HDL 47 >53 mg/dL   Total CHOL/HDL Ratio 3.7 RATIO   VLDL 36 0 - 40 mg/dL   LDL Cholesterol 93 0 - 99 mg/dL    Comment:        Total Cholesterol/HDL:CHD Risk Coronary Heart Disease Risk Table                     Men   Women  1/2 Average Risk   3.4   3.3  Average Risk       5.0   4.4  2 X Average Risk   9.6   7.1  3 X Average Risk  23.4   11.0        Use the calculated Patient Ratio above and the CHD Risk Table to determine the patient's CHD Risk.        ATP III CLASSIFICATION (LDL):  <100     mg/dL   Optimal  299-242  mg/dL   Near or Above                    Optimal  130-159  mg/dL   Borderline  683-419  mg/dL   High  >622     mg/dL   Very High Performed at [facility]  Lab, [address] [place], [place] [address]   Hemoglobin A1c     Status: Abnormal   Collection Time: 01/22/21  2:02 PM  Result Value Ref Range   Hgb A1c MFr Bld 5.7 (H) 4.8 - 5.6 %    Comment: (NOTE) Pre diabetes:          5.7%-6.4%  Diabetes:              >6.4%  Glycemic control for   <7.0% adults with diabetes    Mean Plasma Glucose 116.89 mg/dL    Comment: Performed at [facility], [address] [place], [place] [address]    Blood Alcohol level:  Lab Results  Component Value Date   ETH <10 01/20/2021    Metabolic Disorder Labs: Lab Results  Component Value Date   HGBA1C 5.7 (H) 01/22/2021   MPG 116.89 01/22/2021   No results found for: PROLACTIN Lab Results  Component Value Date   CHOL 176 01/22/2021   TRIG 179 (H) 01/22/2021   HDL 47 01/22/2021   CHOLHDL 3.7 01/22/2021   VLDL 36 01/22/2021   LDLCALC 93 01/22/2021    Physical Findings: AIMS:  , ,  ,  ,    CIWA:    COWS:     Musculoskeletal: Strength & Muscle Tone: within normal limits Gait & Station: normal Patient leans: N/A  Psychiatric Specialty Exam:  Presentation  General Appearance: Appropriate for Environment  Eye Contact:Fair  Speech:Clear and Coherent  Speech Volume:Normal  Handedness:Right   Mood and Affect  Mood:Anxious  Affect:Constricted   Thought Process  Thought Processes:Coherent  Descriptions of Associations:Loose  Orientation:Full (Time, Place and Person)  Thought Content:Rumination  History of Schizophrenia/Schizoaffective disorder:Yes  Duration of Psychotic Symptoms:Greater than six months  Hallucinations:Hallucinations: Auditory; Visual Description of Auditory Hallucinations: voices making fun of him Description of Visual Hallucinations: Cartoon characters coming out of the wall  Ideas of Reference:Paranoia  Suicidal Thoughts:Suicidal Thoughts: Yes, Passive SI Passive Intent and/or Plan: Without Intent  Homicidal  Thoughts:Homicidal Thoughts: No   Sensorium  Memory:Immediate Good  Judgment:Impaired  Insight:Present   Executive Functions  Concentration:Fair  Attention Span:Fair  Recall:Fair  Fund of Knowledge:Fair  Language:Fair   Psychomotor Activity  Psychomotor Activity:Psychomotor Activity: Normal   Assets  Assets:Desire for Improvement; Financial Resources/Insurance; Housing; Resilience; Physical Health; Social Support   Sleep  Sleep:Sleep: Fair Number of Hours of Sleep: 7.5    Physical Exam: Physical Exam ROS Blood pressure 128/77, pulse 74, temperature 98.5 F (36.9 C), temperature source Oral, resp. rate 18, height 5\' 10"  (1.778 m), weight 90.7 kg, SpO2 100 %. Body mass index is 28.7 kg/m.   Treatment Plan Summary: Daily contact with patient to assess and evaluate symptoms and progress in treatment and Medication managementPatient with schizoaffective disorder bipolar type with history of several hospitalizations.  Admitted for aggressive behavior, auditory and visual hallucinations in the setting of noncompliance with medications.  Patient also swallowed batteries and required interventions for removal.  Not all batteries were able to be removed. KUB x-ray obtained yesterday and shows the remaining two batteries are traversing the colon and are in the pelvis without obstruction. Will repeat KUB tomorrow and continue following up until batteries have been evacuate.    Schizoaffective disorder/psychosis - Continue Clozapine tablet 250 mg oral daily at bedtime - Continue benzatropine tablet 0.5 mg oral 2 times daily prophylaxis for EPS -Increase olanzapine Zydis 20 mg oral daily at bedtime  Mood stabilization/depression - Continue sertraline tablet 100 mg oral  daily - Continue lithium carbonate CR tablet 600 mg oral daily at bedtime. Check lithium level Aug 29  Chronic constipation - Continue docusate sodium capsule 200 mg oral 2 times daily - Continue MiraLAX 17  g oral daily  GERD - Continue Protonix EC tablet 40 mg daily  Anxiety/agitation - Continue hydroxyzine tablet 50 mg oral every 6 hours as needed - Continue lorazepam tablet 2 mg oral every 6 hours as needed for anxiety or sedation  - Continue olanzapine tablet 10 mg oral every 6 hours as needed for agitation - Continue ziprasidone injection 20 mg IM every 8 hours as needed for agitation  Smoking cessation/nicotine replacement - Continue nicotine transdermal patch 14 mg daily  PRN, Other  --Continue Tylenol 650 mg po every 6 hrs prn pain --Continue MAALOX/MYLANTA 30 mL po every 4 hrs prn indigestion --Continue Milk of Magnesia 30 mL po daily prn constipation     Jesse Sans, MD 01/23/2021, 10:28 AM

## 2021-01-23 NOTE — Tx Team (Addendum)
Interdisciplinary Treatment and Diagnostic Plan Update  01/23/2021 Time of Session: 9:00AM Lawrence Morgan MRN: [id number]  Principal Diagnosis: Schizoaffective disorder, bipolar type [facility])  Secondary Diagnoses: Principal Problem:   Schizoaffective disorder, bipolar type ([place]) Active Problems:   Chronic constipation   GERD (gastroesophageal reflux disease)   Ingestion of foreign body   Current Medications:  Current Facility-Administered Medications  Medication Dose Route Frequency Provider Last Rate Last Admin   acetaminophen (TYLENOL) tablet 650 mg  650 mg Oral Q6H PRN [name], [name], MD   650 mg at 01/22/21 1249   alum & mag hydroxide-simeth (MAALOX/MYLANTA) 200-200-20 MG/5ML suspension 30 mL  30 mL Oral Q4H PRN [name], [name], MD       benztropine (COGENTIN) tablet 0.5 mg  0.5 mg Oral BID [name], [name], MD   0.5 mg at 01/23/21 0758   cloZAPine (CLOZARIL) tablet 250 mg  250 mg Oral QHS [name], [name], MD   250 mg at 01/22/21 2113   docusate sodium (COLACE) capsule 200 mg  200 mg Oral BID [name], [name], MD   200 mg at 01/23/21 0758   hydrOXYzine (ATARAX/VISTARIL) tablet 50 mg  50 mg Oral Q6H PRN [name], [name], MD   50 mg at 01/22/21 0804   lithium carbonate (LITHOBID) CR tablet 600 mg  600 mg Oral QHS [name], [name], MD   600 mg at 01/22/21 2114   magnesium hydroxide (MILK OF MAGNESIA) suspension 30 mL  30 mL Oral Daily PRN [name], [name], MD       nicotine (NICODERM CQ - dosed in mg/24 hours) patch 14 mg  14 mg Transdermal Daily Salley Scarlet, MD   14 mg at 01/23/21 0801   OLANZapine (ZYPREXA) tablet 10 mg  10 mg Oral Q6H PRN Salley Scarlet, MD       OLANZapine zydis (ZYPREXA) disintegrating tablet 15 mg  15 mg Oral QHS Salley Scarlet, MD   15 mg at 01/22/21 2114   pantoprazole (PROTONIX) EC tablet 40 mg  40 mg Oral Daily [name], [name], MD   40 mg at 01/23/21 0758   polyethylene glycol (MIRALAX / GLYCOLAX) packet 17 g  17 g Oral Daily [name], Madie Reno, MD        sertraline (ZOLOFT) tablet 100 mg  100 mg Oral Daily [name], [name], MD   100 mg at 01/23/21 0758   ziprasidone (GEODON) injection 20 mg  20 mg Intramuscular Q8H PRN Salley Scarlet, MD       PTA Medications: Medications Prior to Admission  Medication Sig Dispense Refill Last Dose   benztropine (COGENTIN) 1 MG tablet Take 1 mg by mouth 2 (two) times daily.      cloZAPine (CLOZARIL) 100 MG tablet Take 500 mg by mouth at bedtime.      famotidine (PEPCID) 20 MG tablet Take 20 mg by mouth daily.      fluvoxaMINE (LUVOX) 50 MG tablet Take 50 mg by mouth at bedtime.      lithium 300 MG tablet Take 600 mg by mouth 2 (two) times daily.      OLANZapine (ZYPREXA) 15 MG tablet Take 15 mg by mouth at bedtime.      oxybutynin (DITROPAN-XL) 10 MG 24 hr tablet Take 10 mg by mouth daily.       Patient Stressors: Health problems Other: SI/HI/AVH  Patient Strengths: Agricultural engineer for treatment/growth  Treatment Modalities: Medication Management, Group therapy, Case management,  1 to 1 session  with clinician, Psychoeducation, Recreational therapy.   Physician Treatment Plan for Primary Diagnosis: Schizoaffective disorder, bipolar type ([place]) Long Term Goal(s): Improvement in symptoms so as ready for discharge   Short Term Goals: Ability to identify changes in lifestyle to reduce recurrence of condition will improve Ability to disclose and discuss suicidal ideas Ability to demonstrate self-control will improve Ability to identify and develop effective coping behaviors will improve Ability to maintain clinical measurements within normal limits will improve Compliance with prescribed medications will improve  Medication Management: Evaluate patient's response, side effects, and tolerance of medication regimen.  Therapeutic Interventions: 1 to 1 sessions, Unit Group sessions and Medication administration.  Evaluation of Outcomes: Not Met  Physician Treatment Plan for Secondary  Diagnosis: Principal Problem:   Schizoaffective disorder, bipolar type ([place]) Active Problems:   Chronic constipation   GERD (gastroesophageal reflux disease)   Ingestion of foreign body  Long Term Goal(s): Improvement in symptoms so as ready for discharge   Short Term Goals: Ability to identify changes in lifestyle to reduce recurrence of condition will improve Ability to disclose and discuss suicidal ideas Ability to demonstrate self-control will improve Ability to identify and develop effective coping behaviors will improve Ability to maintain clinical measurements within normal limits will improve Compliance with prescribed medications will improve     Medication Management: Evaluate patient's response, side effects, and tolerance of medication regimen.  Therapeutic Interventions: 1 to 1 sessions, Unit Group sessions and Medication administration.  Evaluation of Outcomes: Not Met   RN Treatment Plan for Primary Diagnosis: Schizoaffective disorder, bipolar type ([place]) Long Term Goal(s): Knowledge of disease and therapeutic regimen to maintain health will improve  Short Term Goals: Ability to verbalize frustration and anger appropriately will improve, Ability to demonstrate self-control, Ability to participate in decision making will improve, Ability to disclose and discuss suicidal ideas, Ability to identify and develop effective coping behaviors will improve, and Compliance with prescribed medications will improve  Medication Management: RN will administer medications as ordered by provider, will assess and evaluate patient's response and provide education to patient for prescribed medication. RN will report any adverse and/or side effects to prescribing provider.  Therapeutic Interventions: 1 on 1 counseling sessions, Psychoeducation, Medication administration, Evaluate responses to treatment, Monitor vital signs and CBGs as ordered, Perform/monitor CIWA, COWS, AIMS and Fall Risk  screenings as ordered, Perform wound care treatments as ordered.  Evaluation of Outcomes: Not Met   LCSW Treatment Plan for Primary Diagnosis: Schizoaffective disorder, bipolar type ([place]) Long Term Goal(s): Safe transition to appropriate next level of care at discharge, Engage patient in therapeutic group addressing interpersonal concerns.  Short Term Goals: Engage patient in aftercare planning with referrals and resources, Increase social support, Facilitate acceptance of mental health diagnosis and concerns, Identify triggers associated with mental health/substance abuse issues, and Increase skills for wellness and recovery  Therapeutic Interventions: Assess for all discharge needs, 1 to 1 time with Social worker, Explore available resources and support systems, Assess for adequacy in community support network, Educate family and significant other(s) on suicide prevention, Complete Psychosocial Assessment, Interpersonal group therapy.  Evaluation of Outcomes: Not Met   Progress in Treatment: Attending groups: No. Participating in groups: No. Taking medication as prescribed: Yes. Toleration medication: Yes. Family/Significant other contact made: Yes, individual(s) contacted:  Rob Hickman, father/guardian. Patient understands diagnosis: No. Discussing patient identified problems/goals with staff: Yes. Medical problems stabilized or resolved: Yes. Denies suicidal/homicidal ideation: Yes. Issues/concerns per patient self-inventory: No. Other: None.  New problem(s) identified: No, Describe:  none.  New Short Term/Long Term Goal(s): elimination of symptoms of psychosis, medication management for mood stabilization; development of comprehensive mental wellness plan.  Patient Goals: "Get in contact with my dad and get long-term treatment."  Pt declined to sign treatment team form.  Discharge Plan or Barriers: Pt specifically mentions going to [facility]. He was informed that he did not  meet criteria for [facility]'s level of care. CSW will assist pt, along with guardian feedback/approval, with development of an appropriate aftercare/discharge plan.   Reason for Continuation of Hospitalization: Aggression Hallucinations Medication stabilization  Estimated Length of Stay: 1-7 days  Recreational Therapy: Patient Stressors: N/A Patient Goal: Patient will engage in groups without prompting or encouragement from LRT x3 group sessions within 5 recreation therapy group sessions.  Attendees: Patient: Lawrence Morgan 01/23/2021 9:12 AM  Physician: Selina Cooley, MD 01/23/2021 9:12 AM  Nursing: Leeann Must, RN 01/23/2021 9:12 AM  RN Care Manager: 01/23/2021 9:12 AM  Social Worker: Chalmers Guest. Guerry Bruin, MSW, [place], [place] 01/23/2021 9:12 AM  Recreational Therapist: Devin Going, LRT  01/23/2021 9:12 AM  Other: Assunta Curtis, MSW, LCSW 01/23/2021 9:12 AM  Other: Theodosia Quay, RN 01/23/2021 9:12 AM  Other: 01/23/2021 9:12 AM    Scribe for Treatment Team: Shirl Harris, LCSW 01/23/2021 9:12 AM

## 2021-01-23 NOTE — Plan of Care (Signed)
Patient denies SI/HI/AH and contracts for safety. Patient verbalized Depression 8/10 and Anxiety 6/10. Patient did verbalize visual hallucinations of cartoon characters protruding from the walls.  Continues to state need for long term treatment. Patient observed with limited participation in therapeutic milieu. Patient did comply with scheduled meds per MD and denies any adverse reactions form medications administered. Frequent verbal contact and encouragement provided. Routine safety checks q 15 min.  Will continue to monitor patient on unit during shift.    Problem: Self-Concept: Goal: Level of anxiety will decrease Outcome: Progressing   Problem: Activity: Goal: Imbalance in normal sleep/wake cycle will improve Outcome: Progressing

## 2021-01-23 NOTE — [facility] Counselor (Addendum)
CSW attempted to make contact with pt guardian/father, Tawni Carnes at [phone]. Unable to make contact and voicemail box was full.   CSW notified pt regarding attempt at contact. No other concerns expressed during that time. Contact ended without incident.   Vilma Meckel. Algis Greenhouse, MSW, LCSW, LCAS 01/23/2021 2:39 PM

## 2021-01-23 NOTE — [facility] Counselor (Signed)
CSW attempted contact with Isac Sarna [phone]). Unable to make contact but left HIPPA complaint voicemail.   Vilma Meckel. Algis Greenhouse, MSW, LCSW, LCAS 01/23/2021 4:39 PM

## 2021-01-24 ENCOUNTER — Inpatient Hospital Stay: Payer: Medicare Other

## 2021-01-24 DIAGNOSIS — F250 Schizoaffective disorder, bipolar type: Secondary | ICD-10-CM | POA: Diagnosis not present

## 2021-01-24 NOTE — [facility] Group Notes (Signed)
[facility] LCSW Group Therapy Note  Date/Time:  01/24/2021 1:00-2:00PM  Type of Therapy and Topic:  Group Therapy:  Healthy and Unhealthy Supports  Participation Level:  Active   Description of Group:  Patients in this group were introduced to the idea of adding a variety of healthy supports to address the various needs in their lives.Patients discussed what additional healthy supports could be helpful in their recovery and wellness after discharge in order to prevent future hospitalizations.   An emphasis was placed on using counselor, doctor, therapy groups, 12-step groups, and problem-specific support groups to expand supports.  They also worked as a group on developing a specific plan for several patients to deal with unhealthy supports through boundary-setting, psychoeducation with loved ones, and even termination of relationships.   Therapeutic Goals:   1)  discuss importance of adding supports to stay well once out of the hospital  2)  compare healthy versus unhealthy supports and identify some examples of each  3)  generate ideas and descriptions of healthy supports that can be added  4)  offer mutual support about how to address unhealthy supports  5)  encourage active participation in and adherence to discharge plan    Summary of Patient Progress:  The patient presented to group and intermittently engaged in group discussion. Patient walked in and out of group several times. Patient also stood up on three occassions during group and walked up to CSW with a folded piece of paper. Patient asked this CSW to write "return once marijuana is legal in [place]" on the first occasion, "67 assassinations in [place]" on the second occasion, and asked this CSW to add a 0 to "90" on the third occasion. Patient presented as preoccupied with his thoughts, but did not appear distressed. Patient recalled that it was hard when his mother passed away when discussing relationship supports. Patient  requested magazines and a folder to make a vision board. Patient stated a desire to go to a long term treatment facility as a source of support after discharging.    Therapeutic Modalities:   Motivational Interviewing Brief Solution-Focused Therapy  Marletta Lor 01/24/2021  2:47 PM

## 2021-01-24 NOTE — Progress Notes (Signed)
[facility] MD Progress Note  01/24/2021 11:57 AM Lawrence Morgan  MRN:  [id number] Subjective: Follow-up for this 40 year old man with schizoaffective disorder.  Patient seen and chart reviewed.  Vitals unremarkable.  No new labs.  Patient continues to state he feels very paranoid.  He says he feels like someone is trying to kill him.  He has trouble sleeping at night.  Feels edgy much of the time.  No thoughts or behaviors suggesting violence or threats currently.  No suicidal ideation currently.  Patient is requesting to go to [facility].  He has refused suggestions to increase dose of Clozapine.  Reasoning is not very logical.  He says it is because he already takes 6 pills.  I explained to him this was because his dose was an odd amount and they were giving him small size pills not because his dose was elevated.  He remains unconvinced. Principal Problem: Schizoaffective disorder, bipolar type ([facility]) Diagnosis: Principal Problem:   Schizoaffective disorder, bipolar type ([facility]) Active Problems:   Chronic constipation   GERD (gastroesophageal reflux disease)   Ingestion of foreign body  Total Time spent with patient: 30 minutes  Past Psychiatric History: Past history of schizoaffective disorder paranoia hospitalizations.  History of swallowing inanimate objects especially batteries when he is psychotic.  Past Medical History: History reviewed. No pertinent past medical history.  Past Surgical History:  Procedure Laterality Date   ESOPHAGOGASTRODUODENOSCOPY (EGD) WITH PROPOFOL N/A 01/20/2021   Procedure: ESOPHAGOGASTRODUODENOSCOPY (EGD) WITH PROPOFOL;  Surgeon: Midge Minium, MD;  Location: [facility] ENDOSCOPY;  Service: Endoscopy;  Laterality: N/A;   Family History: History reviewed. No pertinent family history. Family Psychiatric  History: See previous Social History:  Social History   Substance and Sexual Activity  Alcohol Use Never     Social History   Substance and Sexual Activity   Drug Use Never    Social History   Socioeconomic History   Marital status: Single    Spouse name: Not on file   Number of children: Not on file   Years of education: Not on file   Highest education level: Not on file  Occupational History   Not on file  Tobacco Use   Smoking status: Every Day    Packs/day: 1.50    Types: Cigarettes   Smokeless tobacco: Never  Vaping Use   Vaping Use: Some days   Substances: Nicotine  Substance and Sexual Activity   Alcohol use: Never   Drug use: Never   Sexual activity: Not Currently  Other Topics Concern   Not on file  Social History Narrative   Not on file   Social Determinants of Health   Financial Resource Strain: Not on file  Food Insecurity: Not on file  Transportation Needs: Not on file  Physical Activity: Not on file  Stress: Not on file  Social Connections: Not on file   Additional Social History:                         Sleep: Fair  Appetite:  Fair  Current Medications: Current Facility-Administered Medications  Medication Dose Route Frequency Provider Last Rate Last Admin   acetaminophen (TYLENOL) tablet 650 mg  650 mg Oral Q6H PRN ,  T, MD   650 mg at 01/22/21 1249   alum & mag hydroxide-simeth (MAALOX/MYLANTA) 200-200-20 MG/5ML suspension 30 mL  30 mL Oral Q4H PRN , [name], MD       benztropine (COGENTIN) tablet 0.5 mg  0.5 mg Oral BID , [name] T, MD   0.5 mg at 01/24/21 16100752   cloZAPine (CLOZARIL) tablet 250 mg  250 mg Oral QHS ,  T, MD   250 mg at 01/23/21 2118   docusate sodium (COLACE) capsule 200 mg  200 mg Oral BID , [name] T, MD   200 mg at 01/24/21 0753   hydrOXYzine (ATARAX/VISTARIL) tablet 50 mg  50 mg Oral Q6H PRN , [name] T, MD   50 mg at 01/22/21 0804   lithium carbonate (LITHOBID) CR tablet 600 mg  600 mg Oral QHS ,  T, MD   600 mg at 01/23/21 2117   magnesium hydroxide (MILK OF MAGNESIA) suspension 30 mL  30 mL Oral Daily PRN ,  [name] T, MD       nicotine (NICODERM CQ - dosed in mg/24 hours) patch 14 mg  14 mg Transdermal Daily [name], Megan M, MD   14 mg at 01/23/21 0801   OLANZapine (ZYPREXA) tablet 10 mg  10 mg Oral Q6H PRN [name], Megan M, MD       OLANZapine zydis [facility](ZYPREXA) disintegrating tablet 20 mg  20 mg Oral QHS [name], Megan M, MD   20 mg at 01/23/21 2118   pantoprazole (PROTONIX) EC tablet 40 mg  40 mg Oral Daily , [name] T, MD   40 mg at 01/24/21 0753   polyethylene glycol (MIRALAX / GLYCOLAX) packet 17 g  17 g Oral Daily , [name] T, MD       sertraline (ZOLOFT) tablet 100 mg  100 mg Oral Daily , [name] T, MD   100 mg at 01/24/21 0753   ziprasidone (GEODON) injection 20 mg  20 mg Intramuscular Q8H PRN [name], Megan M, MD        Lab Results:  Results for orders placed or performed during the hospital encounter of 01/21/21 (from the past 48 hour(s))  Lipid panel     Status: Abnormal   Collection Time: 01/22/21  2:02 PM  Result Value Ref Range   Cholesterol 176 0 - 200 mg/dL   Triglycerides 960179 (H) <150 mg/dL   HDL 47 >45>40 mg/dL   Total CHOL/HDL Ratio 3.7 RATIO   VLDL 36 0 - 40 mg/dL   LDL Cholesterol 93 0 - 99 mg/dL    Comment:        Total Cholesterol/HDL:CHD Risk Coronary Heart Disease Risk Table                     Men   Women  1/2 Average Risk   3.4   3.3  Average Risk       5.0   4.4  2 X Average Risk   9.6   7.1  3 X Average Risk  23.4   11.0        Use the calculated Patient Ratio above and the CHD Risk Table to determine the patient's CHD Risk.        ATP III CLASSIFICATION (LDL):  <100     mg/dL   Optimal  [id number]  mg/dL   Near or Above                    Optimal  130-159  mg/dL   Borderline  [phone]  mg/dL   High  >956>190     mg/dL   Very High Performed at [facility], [address] [place], [place] [address]   Hemoglobin A1c     Status: Abnormal  Collection Time: 01/22/21  2:02 PM  Result Value Ref Range   Hgb A1c MFr Bld 5.7 (H) 4.8 - 5.6 %     Comment: (NOTE) Pre diabetes:          5.7%-6.4%  Diabetes:              >6.4%  Glycemic control for   <7.0% adults with diabetes    Mean Plasma Glucose 116.89 mg/dL    Comment: Performed at [facility], [address] [place], [place] [address]    Blood Alcohol level:  Lab Results  Component Value Date   ETH <10 01/20/2021    Metabolic Disorder Labs: Lab Results  Component Value Date   HGBA1C 5.7 (H) 01/22/2021   MPG 116.89 01/22/2021   No results found for: PROLACTIN Lab Results  Component Value Date   CHOL 176 01/22/2021   TRIG 179 (H) 01/22/2021   HDL 47 01/22/2021   CHOLHDL 3.7 01/22/2021   VLDL 36 01/22/2021   LDLCALC 93 01/22/2021    Physical Findings: AIMS:  , ,  ,  ,    CIWA:    COWS:     Musculoskeletal: Strength & Muscle Tone: within normal limits Gait & Station: normal Patient leans: N/A  Psychiatric Specialty Exam:  Presentation  General Appearance: Appropriate for Environment  Eye Contact:Fair  Speech:Clear and Coherent  Speech Volume:Normal  Handedness:Right   Mood and Affect  Mood:Anxious  Affect:Constricted   Thought Process  Thought Processes:Coherent  Descriptions of Associations:Loose  Orientation:Full (Time, Place and Person)  Thought Content:Rumination  History of Schizophrenia/Schizoaffective disorder:Yes  Duration of Psychotic Symptoms:Greater than six months  Hallucinations:Hallucinations: Auditory; Visual Description of Auditory Hallucinations: voices making fun of him Description of Visual Hallucinations: Cartoon characters coming out of the wall  Ideas of Reference:Paranoia  Suicidal Thoughts:Suicidal Thoughts: Yes, Passive  Homicidal Thoughts:Homicidal Thoughts: No   Sensorium  Memory:Immediate Good  Judgment:Impaired  Insight:Present   Executive Functions  Concentration:Fair  Attention Span:Fair  Recall:Fair  Fund of Knowledge:Fair  Language:Fair   Psychomotor Activity   Psychomotor Activity:Psychomotor Activity: Normal   Assets  Assets:Desire for Improvement; Financial Resources/Insurance; Housing; Resilience; Physical Health; Social Support   Sleep  Sleep:Sleep: Fair Number of Hours of Sleep: 7.5    Physical Exam: Physical Exam Vitals and nursing note reviewed.  Constitutional:      Appearance: Normal appearance.  HENT:     Head: Normocephalic and atraumatic.     Mouth/Throat:     Pharynx: Oropharynx is clear.  Eyes:     Pupils: Pupils are equal, round, and reactive to light.  Cardiovascular:     Rate and Rhythm: Normal rate and regular rhythm.  Pulmonary:     Effort: Pulmonary effort is normal.     Breath sounds: Normal breath sounds.  Abdominal:     General: Abdomen is flat.     Palpations: Abdomen is soft.  Musculoskeletal:        General: Normal range of motion.  Skin:    General: Skin is warm and dry.  Neurological:     General: No focal deficit present.     Mental Status: He is alert. Mental status is at baseline.  Psychiatric:        Attention and Perception: Attention normal.        Mood and Affect: Mood is anxious. Affect is blunt.        Speech: Speech is tangential.        Thought Content: Thought content  is paranoid. Thought content does not include homicidal or suicidal ideation.        Cognition and Memory: Cognition is impaired.        Judgment: Judgment is inappropriate.   Review of Systems  Constitutional: Negative.   HENT: Negative.    Eyes: Negative.   Respiratory: Negative.    Cardiovascular: Negative.   Gastrointestinal: Negative.   Musculoskeletal: Negative.   Skin: Negative.   Neurological: Negative.   Psychiatric/Behavioral:  Positive for depression and hallucinations. Negative for suicidal ideas. The patient is nervous/anxious and has insomnia.   Blood pressure 135/84, pulse 77, temperature 98.6 F (37 C), temperature source Oral, resp. rate 18, height 5\' 10"  (1.778 m), weight 90.7 kg, SpO2 100  %. Body mass index is 28.7 kg/m.   Treatment Plan Summary: Medication management and Plan patient would not agree to recommendation to increase dose of clozapine.  Clozapine level is currently pending which will give a clearer idea about the utility of that anyway.  No other change to medications today.  Lithium level will also be pending.  Encourage patient to attend groups talk to staff and let know about his symptoms.  I do not think there was a plan for a repeat x-ray today of the batteries which last we know were progressing normally through the colon.  [place], MD 01/24/2021, 11:57 AM

## 2021-01-24 NOTE — Progress Notes (Signed)
Pt pacing the hall frequently. He denied any GI distress, no reports of a bowel movement. Pt does not seem to be concerned about the battery he ingested. His conversation with me was med related, he would not discuss what brought him to the hospital. Short interaction with peers.

## 2021-01-24 NOTE — Progress Notes (Signed)
Pt attended groups and went outside. He has been mainly compliant today except that he refused his 1700 meds. He states that he did not have a good day however he was UTA anymore. Torrie Mayers RN

## 2021-01-24 NOTE — Progress Notes (Signed)
Pt complains of some pain and a sore throat. No other complaints. Pt rates pain in throat 6/10 and stomach 7/10. Pt refuses any meds. Torrie Mayers RN

## 2021-01-24 NOTE — Progress Notes (Signed)
Pt ambulating on the unit, quiet and denies SI thoughts. He reports his throat is sore from swallowing the batteries. Affect sad, easy to engage on approach.

## 2021-01-24 NOTE — Plan of Care (Addendum)
Pt rates depression 8/10, hopelessness 10/10 and anxiety 8/10. Pt denies HI and AVH however endorses passive SI with no plan and verbally contracts for safety. Pt was educated on care plan and verbalizes understanding. Torrie Mayers RN Problem: Education: Goal: Ability to state activities that reduce stress will improve Outcome: Progressing   Problem: Coping: Goal: Ability to identify and develop effective coping behavior will improve Outcome: Progressing   Problem: Self-Concept: Goal: Ability to identify factors that promote anxiety will improve Outcome: Progressing Goal: Level of anxiety will decrease Outcome: Progressing Goal: Ability to modify response to factors that promote anxiety will improve Outcome: Progressing   Problem: Education: Goal: Utilization of techniques to improve thought processes will improve Outcome: Progressing Goal: Knowledge of the prescribed therapeutic regimen will improve Outcome: Progressing   Problem: Activity: Goal: Interest or engagement in leisure activities will improve Outcome: Progressing Goal: Imbalance in normal sleep/wake cycle will improve Outcome: Progressing   Problem: Coping: Goal: Coping ability will improve Outcome: Progressing Goal: Will verbalize feelings Outcome: Progressing   Problem: Health Behavior/Discharge Planning: Goal: Ability to make decisions will improve Outcome: Progressing Goal: Compliance with therapeutic regimen will improve Outcome: Progressing   Problem: Role Relationship: Goal: Will demonstrate positive changes in social behaviors and relationships Outcome: Progressing   Problem: Safety: Goal: Ability to disclose and discuss suicidal ideas will improve Outcome: Progressing Goal: Ability to identify and utilize support systems that promote safety will improve Outcome: Progressing   Problem: Self-Concept: Goal: Will verbalize positive feelings about self Outcome: Progressing Goal: Level of anxiety  will decrease Outcome: Progressing   Problem: Education: Goal: Knowledge of [facility] Education information/materials will improve Outcome: Progressing Goal: Emotional status will improve Outcome: Progressing Goal: Mental status will improve Outcome: Progressing Goal: Verbalization of understanding the information provided will improve Outcome: Progressing   Problem: Activity: Goal: Interest or engagement in activities will improve Outcome: Progressing Goal: Sleeping patterns will improve Outcome: Progressing   Problem: Coping: Goal: Ability to verbalize frustrations and anger appropriately will improve Outcome: Progressing Goal: Ability to demonstrate self-control will improve Outcome: Progressing   Problem: Health Behavior/Discharge Planning: Goal: Identification of resources available to assist in meeting health care needs will improve Outcome: Progressing Goal: Compliance with treatment plan for underlying cause of condition will improve Outcome: Progressing   Problem: Physical Regulation: Goal: Ability to maintain clinical measurements within normal limits will improve Outcome: Progressing   Problem: Safety: Goal: Periods of time without injury will increase Outcome: Progressing

## 2021-01-24 NOTE — Progress Notes (Signed)
Pt came to the nursing station and asked why he was not taking a multivitamin, he did not think he needs vistaril  and asked if his Clozaril could be decreased. I stated I would inform his doctor and he could discuss with them on rounds.

## 2021-01-25 DIAGNOSIS — F250 Schizoaffective disorder, bipolar type: Secondary | ICD-10-CM | POA: Diagnosis not present

## 2021-01-25 MED ORDER — ASCORBIC ACID 500 MG PO TABS
500.0000 mg | ORAL_TABLET | Freq: Every day | ORAL | Status: DC
  Administered 2021-01-25 – 2021-02-17 (×24): 500 mg via ORAL
  Filled 2021-01-25 (×29): qty 1

## 2021-01-25 NOTE — [facility] Group Notes (Addendum)
LCSW Group Therapy Note  01/25/2021 1:00PM  Type of Therapy and Topic: Group Therapy: Cognitive Distortions and Behavioral Activation  Participation Level: Active  Description of Group:  Patients in this group will be introduced to the cognitive triangle and learn about the relationship between thoughts, behaviors, and emotions and how thoughts effect they way a person feels and behaves. Patients will investigate common cognitive distortions. Patients will identify and describe cognitive distortions, and describe the feelings these distortions create for them. Patients will identify one or more situations in their personal life where they have cognitively distorted thinking and will verbalize challenging this cognitive distortion through positive thinking skills. Patients will additionally investigate how behaviors effect a way a person feels and thinks. Patients will identify healthy behaviors they can practice that promote wellness and recovery.   Therapeutic Goals:  1. Patient will identify two or more cognitive distortions they have used  2. Patient will identify one or more emotions that stem from use of a cognitive distortion  3. Patient will demonstrate use of a positive affirmation to counter a cognitive distortion through discussion and/or role play  4. Patient will describe one way cognitive distortions can be detrimental to wellness  5. Patient will identify one or more behaviors that promotes wellness  Summary of Patient Progress: Patient was present for the entirety of group. Patient asked CSW to speak after group about "a side project". Patient was not able to identify a cognitive distortion, but did identify behaviors that are conducive to his recovery and well-being, including maintaining hygiene, taking medication, and obeying rules. After group, patient requested that CSW re-write words he had written on the group handout, which consisted of a random composition of words  including, "Candy paint champage...drip drop". Patient stated that he was trying to plan a paint job.   Therapeutic Modalities:  Cognitive Behavioral Therapy  Motivational Interviewing  Norberto Sorenson, Theresia Majors  01/25/2021 2:44 PM

## 2021-01-25 NOTE — [facility] Group Notes (Signed)
[facility] Group Notes:  (Nursing/MHT/Case Management/Adjunct)  Date:  01/25/2021  Time:  11:18 PM  Type of Therapy:  Group Therapy  Participation Level:  Did Not Attend   Summary of Progress/Problems:   L  01/25/2021, 11:18 PM 

## 2021-01-25 NOTE — Progress Notes (Signed)
[facility] MD Progress Note  01/25/2021 4:31 PM Lawrence Morgan  MRN:  [id number]  Subjective: Follow-up for this 40 year old man with schizoaffective disorder.  Patient seen and chart reviewed. He reports his depression fluctuates between low to medium, no suicidal ideations. "I'm trying to stay positive".  Sleep was fair with 6 hours of sleep, reports feeling "tired" and lying in bed prior to lunch. Appetite is "ok".  Denies hearing voices or seeing things.  RNs report last night he was paranoid and talking about the illuminators.  He refused his Miralax but agreed to take it with grape juice to assist the passage of the batteries he swallowed.  This provider mixed it for him and he drank it.  Very pleasant on assessment.  He states his guardian, his father, wants him to go to  [facility] to stabilize as he has been there 2-3 times in the past 1.5 years with stays as long as 3.5 months.  He appears to be improving and may be avoiding his group home as he does "not like it there."  No behavior issues on the unit, denies suicidal/homicidal ideations.  Principal Problem: Schizoaffective disorder, bipolar type (HCC) Diagnosis: Principal Problem:   Schizoaffective disorder, bipolar type (HCC) Active Problems:   Ingestion of foreign body   Chronic constipation   GERD (gastroesophageal reflux disease)  Total Time spent with patient: 30 minutes  Past Psychiatric History: Past history of schizoaffective disorder paranoia hospitalizations.  History of swallowing inanimate objects especially batteries when he is psychotic.  Past Medical History: History reviewed. No pertinent past medical history.  Past Surgical History:  Procedure Laterality Date   ESOPHAGOGASTRODUODENOSCOPY (EGD) WITH PROPOFOL N/A 01/20/2021   Procedure: ESOPHAGOGASTRODUODENOSCOPY (EGD) WITH PROPOFOL;  Surgeon: Midge Minium, MD;  Location: [facility] ENDOSCOPY;  Service: Endoscopy;  Laterality: N/A;   Family History: History reviewed. No pertinent family  history. Family Psychiatric  History: See previous Social History:  Social History   Substance and Sexual Activity  Alcohol Use Never     Social History   Substance and Sexual Activity  Drug Use Never    Social History   Socioeconomic History   Marital status: Single    Spouse name: Not on file   Number of children: Not on file   Years of education: Not on file   Highest education level: Not on file  Occupational History   Not on file  Tobacco Use   Smoking status: Every Day    Packs/day: 1.50    Types: Cigarettes   Smokeless tobacco: Never  Vaping Use   Vaping Use: Some days   Substances: Nicotine  Substance and Sexual Activity   Alcohol use: Never   Drug use: Never   Sexual activity: Not Currently  Other Topics Concern   Not on file  Social History Narrative   Not on file   Social Determinants of Health   Financial Resource Strain: Not on file  Food Insecurity: Not on file  Transportation Needs: Not on file  Physical Activity: Not on file  Stress: Not on file  Social Connections: Not on file   Additional Social History: He does not like his group home    Sleep: Fair  Appetite:  Fair  Current Medications: Current Facility-Administered Medications  Medication Dose Route Frequency Provider Last Rate Last Admin   acetaminophen (TYLENOL) tablet 650 mg  650 mg Oral Q6H PRN [name], [name], MD   650 mg at 01/22/21 1249   alum & mag hydroxide-simeth (MAALOX/MYLANTA) 200-200-20 MG/5ML suspension  30 mL  30 mL Oral Q4H PRN [name], [name], MD       benztropine (COGENTIN) tablet 0.5 mg  0.5 mg Oral BID [name], [name], MD   0.5 mg at 01/25/21 7902   cloZAPine (CLOZARIL) tablet 250 mg  250 mg Oral QHS [name], [name], MD   250 mg at 01/24/21 2143   docusate sodium (COLACE) capsule 200 mg  200 mg Oral BID [name], [name], MD   200 mg at 01/25/21 0843   hydrOXYzine (ATARAX/VISTARIL) tablet 50 mg  50 mg Oral Q6H PRN [name], [name], MD   50 mg at 01/22/21 0804    lithium carbonate (LITHOBID) CR tablet 600 mg  600 mg Oral QHS [name], [name], MD   600 mg at 01/24/21 2144   magnesium hydroxide (MILK OF MAGNESIA) suspension 30 mL  30 mL Oral Daily PRN [name], [name], MD       nicotine (NICODERM CQ - dosed in mg/24 hours) patch 14 mg  14 mg Transdermal Daily Jesse Sans, MD   14 mg at 01/23/21 0801   OLANZapine (ZYPREXA) tablet 10 mg  10 mg Oral Q6H PRN Jesse Sans, MD       OLANZapine zydis (ZYPREXA) disintegrating tablet 20 mg  20 mg Oral QHS Jesse Sans, MD   20 mg at 01/24/21 2144   pantoprazole (PROTONIX) EC tablet 40 mg  40 mg Oral Daily [name], [name], MD   40 mg at 01/25/21 0843   polyethylene glycol (MIRALAX / GLYCOLAX) packet 17 g  17 g Oral Daily [name], [name], MD   17 g at 01/25/21 1137   sertraline (ZOLOFT) tablet 100 mg  100 mg Oral Daily [name], [name], MD   100 mg at 01/25/21 0844   ziprasidone (GEODON) injection 20 mg  20 mg Intramuscular Q8H PRN Jesse Sans, MD        Lab Results:  No results found for this or any previous visit (from the past 48 hour(s)).   Blood Alcohol level:  Lab Results  Component Value Date   ETH <10 01/20/2021    Metabolic Disorder Labs: Lab Results  Component Value Date   HGBA1C 5.7 (H) 01/22/2021   MPG 116.89 01/22/2021   No results found for: PROLACTIN Lab Results  Component Value Date   CHOL 176 01/22/2021   TRIG 179 (H) 01/22/2021   HDL 47 01/22/2021   CHOLHDL 3.7 01/22/2021   VLDL 36 01/22/2021   LDLCALC 93 01/22/2021     Musculoskeletal: Strength & Muscle Tone: within normal limits Gait & Station: normal Patient leans: N/A  Psychiatric Specialty Exam:  Presentation  General Appearance: Appropriate for Environment  Eye Contact:Fair  Speech:Clear and Coherent  Speech Volume:Normal  Handedness:Right   Mood and Affect  Mood:Anxious And depressed  Affect:Constricted   Thought Process  Thought Processes:Coherent  Descriptions of Associations:   WDL  Orientation:Full (Time, Place and Person)  Thought Content:  paranoia at times  History of Schizophrenia/Schizoaffective disorder:Yes  Duration of Psychotic Symptoms:Greater than six months  Hallucinations:  none on asessment  Ideas of Reference:  none  Suicidal Thoughts:  denies  Homicidal Thoughts:  denies   Sensorium  Memory:Immediate Good  Judgment:Impaired  Insight:  fair  Executive Functions  Concentration:Fair  Attention Span:Fair  Recall:Fair  Fund of Knowledge:Fair  Language:Fair  Psychomotor Activity  Psychomotor Activity:  decreased   Assets  Assets:Desire for Improvement; Financial Resources/Insurance; Housing; Resilience; Physical Health; Social Support  Sleep  Sleep:  Fair    Physical Exam: Physical Exam Vitals and nursing note reviewed.  Constitutional:      Appearance: Normal appearance.  HENT:     Head: Normocephalic and atraumatic.     Mouth/Throat:     Pharynx: Oropharynx is clear.  Pulmonary:     Effort: Pulmonary effort is normal.  Musculoskeletal:        General: Normal range of motion.  Neurological:     General: No focal deficit present.     Mental Status: He is alert. Mental status is at baseline.  Psychiatric:        Attention and Perception: Attention normal.        Mood and Affect: Mood is anxious and depressed. Affect is blunt.        Speech: Speech normal.        Behavior: Behavior is slowed.        Thought Content: Thought content is paranoid. Thought content does not include homicidal or suicidal ideation.        Cognition and Memory: Cognition is impaired.        Judgment: Judgment is inappropriate.   Review of Systems  Constitutional:  Positive for malaise/fatigue.  HENT: Negative.    Eyes: Negative.   Respiratory: Negative.    Cardiovascular: Negative.   Gastrointestinal: Negative.   Musculoskeletal: Negative.   Skin: Negative.   Neurological: Negative.   Psychiatric/Behavioral:  Positive for  depression. Negative for suicidal ideas. The patient is nervous/anxious.   Blood pressure (!) 132/94, pulse 78, temperature 98.3 F (36.8 C), temperature source Oral, resp. rate 18, height 5\' 10"  (1.778 m), weight 90.7 kg, SpO2 100 %. Body mass index is 28.7 kg/m.   Treatment Plan Summary: Schizoaffective disorder, bipolar type: -Continued Clozaril 250 mg at bedtime -Continue Lithium 600 mg daily -Continue Zyprexa 20 mg at bedtime  Depression: -Continue Zoloft 100 mg daily  Anxiety: -Continue hydroxyzine 50 mg every six hours PRN  EPS: -Continued Cogentin 0.5 mg BID  , NP 01/25/2021, 4:31 PM

## 2021-01-25 NOTE — Progress Notes (Signed)
D: Pt alert and oriented. Pt rates depression 8/10, hopelessness 10/10, and anxiety 10/10. Pt reports energy level as low and concentration as being poor. Pt reports sleep last night as being fair. Pt did receive medications for sleep and did find them helpful. Pt reports experiencing stabbing 5/10 lower back and posterior head pain, pt refused prns when offered. Pt denies experiencing any SI/HI, or AVH at this time.   A: Scheduled medications administered to pt, per MD orders. Support and encouragement provided. Frequent verbal contact made. Routine safety checks conducted q15 minutes.   R: No adverse drug reactions noted. Pt verbally contracts for safety at this time. Pt complaint with medications and treatment plan. Pt interacts well with others on the unit. Pt remains safe at this time. Will continue to monitor.

## 2021-01-25 NOTE — Plan of Care (Signed)
Problem: Education: Goal: Ability to state activities that reduce stress will improve 01/25/2021 0059 by Ardelle Anton, RN Outcome: Progressing 01/25/2021 0059 by Ardelle Anton, RN Outcome: Progressing   Problem: Coping: Goal: Ability to identify and develop effective coping behavior will improve 01/25/2021 0059 by Ardelle Anton, RN Outcome: Progressing 01/25/2021 0059 by Ardelle Anton, RN Outcome: Progressing   Problem: Self-Concept: Goal: Ability to identify factors that promote anxiety will improve 01/25/2021 0059 by Ardelle Anton, RN Outcome: Progressing 01/25/2021 0059 by Ardelle Anton, RN Outcome: Progressing Goal: Level of anxiety will decrease 01/25/2021 0059 by Ardelle Anton, RN Outcome: Progressing 01/25/2021 0059 by Ardelle Anton, RN Outcome: Progressing Goal: Ability to modify response to factors that promote anxiety will improve 01/25/2021 0059 by Ardelle Anton, RN Outcome: Progressing 01/25/2021 0059 by Ardelle Anton, RN Outcome: Progressing   Problem: Education: Goal: Utilization of techniques to improve thought processes will improve 01/25/2021 0059 by Ardelle Anton, RN Outcome: Progressing 01/25/2021 0059 by Ardelle Anton, RN Outcome: Progressing Goal: Knowledge of the prescribed therapeutic regimen will improve 01/25/2021 0059 by Ardelle Anton, RN Outcome: Progressing 01/25/2021 0059 by Ardelle Anton, RN Outcome: Progressing   Problem: Activity: Goal: Interest or engagement in leisure activities will improve 01/25/2021 0059 by Ardelle Anton, RN Outcome: Progressing 01/25/2021 0059 by Ardelle Anton, RN Outcome: Progressing Goal: Imbalance in normal sleep/wake cycle will improve 01/25/2021 0059 by Ardelle Anton, RN Outcome: Progressing 01/25/2021 0059 by Ardelle Anton, RN Outcome:  Progressing   Problem: Coping: Goal: Coping ability will improve 01/25/2021 0059 by Ardelle Anton, RN Outcome: Progressing 01/25/2021 0059 by Ardelle Anton, RN Outcome: Progressing Goal: Will verbalize feelings 01/25/2021 0059 by Ardelle Anton, RN Outcome: Progressing 01/25/2021 0059 by Ardelle Anton, RN Outcome: Progressing   Problem: Health Behavior/Discharge Planning: Goal: Ability to make decisions will improve 01/25/2021 0059 by Ardelle Anton, RN Outcome: Progressing 01/25/2021 0059 by Ardelle Anton, RN Outcome: Progressing Goal: Compliance with therapeutic regimen will improve 01/25/2021 0059 by Ardelle Anton, RN Outcome: Progressing 01/25/2021 0059 by Ardelle Anton, RN Outcome: Progressing   Problem: Role Relationship: Goal: Will demonstrate positive changes in social behaviors and relationships 01/25/2021 0059 by Ardelle Anton, RN Outcome: Progressing 01/25/2021 0059 by Ardelle Anton, RN Outcome: Progressing   Problem: Safety: Goal: Ability to disclose and discuss suicidal ideas will improve 01/25/2021 0059 by Ardelle Anton, RN Outcome: Progressing 01/25/2021 0059 by Ardelle Anton, RN Outcome: Progressing Goal: Ability to identify and utilize support systems that promote safety will improve 01/25/2021 0059 by Ardelle Anton, RN Outcome: Progressing 01/25/2021 0059 by Ardelle Anton, RN Outcome: Progressing   Problem: Self-Concept: Goal: Will verbalize positive feelings about self 01/25/2021 0059 by Ardelle Anton, RN Outcome: Progressing 01/25/2021 0059 by Ardelle Anton, RN Outcome: Progressing Goal: Level of anxiety will decrease 01/25/2021 0059 by Ardelle Anton, RN Outcome: Progressing 01/25/2021 0059 by Ardelle Anton, RN Outcome: Progressing   Problem: Education: Goal: Knowledge of [facility] Education information/materials will improve 01/25/2021 0059 by Ardelle Anton, RN Outcome: Progressing 01/25/2021 0059 by Ardelle Anton, RN Outcome: Progressing Goal: Emotional status will improve 01/25/2021 0059 by Ardelle Anton, RN Outcome: Progressing 01/25/2021 0059 by Ardelle Anton, RN Outcome: Progressing Goal: Mental status will improve 01/25/2021 0059 by Ardelle Anton, RN Outcome: Progressing 01/25/2021 0059 by Ardelle Anton, RN Outcome: Progressing Goal:  Verbalization of understanding the information provided will improve 01/25/2021 0059 by Ardelle Anton, RN Outcome: Progressing 01/25/2021 0059 by Ardelle Anton, RN Outcome: Progressing   Problem: Activity: Goal: Interest or engagement in activities will improve 01/25/2021 0059 by Ardelle Anton, RN Outcome: Progressing 01/25/2021 0059 by Ardelle Anton, RN Outcome: Progressing Goal: Sleeping patterns will improve 01/25/2021 0059 by Ardelle Anton, RN Outcome: Progressing 01/25/2021 0059 by Ardelle Anton, RN Outcome: Progressing   Problem: Coping: Goal: Ability to verbalize frustrations and anger appropriately will improve 01/25/2021 0059 by Ardelle Anton, RN Outcome: Progressing 01/25/2021 0059 by Ardelle Anton, RN Outcome: Progressing Goal: Ability to demonstrate self-control will improve 01/25/2021 0059 by Ardelle Anton, RN Outcome: Progressing 01/25/2021 0059 by Ardelle Anton, RN Outcome: Progressing   Problem: Health Behavior/Discharge Planning: Goal: Identification of resources available to assist in meeting health care needs will improve 01/25/2021 0059 by Ardelle Anton, RN Outcome: Progressing 01/25/2021 0059 by Ardelle Anton, RN Outcome: Progressing Goal: Compliance with treatment plan for underlying cause of condition will  improve 01/25/2021 0059 by Ardelle Anton, RN Outcome: Progressing 01/25/2021 0059 by Ardelle Anton, RN Outcome: Progressing   Problem: Physical Regulation: Goal: Ability to maintain clinical measurements within normal limits will improve 01/25/2021 0059 by Ardelle Anton, RN Outcome: Progressing 01/25/2021 0059 by Ardelle Anton, RN Outcome: Progressing   Problem: Safety: Goal: Periods of time without injury will increase 01/25/2021 0059 by Ardelle Anton, RN Outcome: Progressing 01/25/2021 0059 by Ardelle Anton, RN Outcome: Progressing

## 2021-01-25 NOTE — Progress Notes (Addendum)
Pt is A&Ox4, calm, cooperative, isolates in  his room, refused HS Clozaril angrily states, "I already told that doctor that it was too much! I'm not taking that much Clozaril!" Pt recognized the Clozaril pills and removed them from his cup and returned them to writer, and despite encouragement to take them, continues to refuse. Pt denies any desire to swallow objects at this time and denies any Bms thus far this shift. Pt denies SI/HI/AVH. Pt reminded not to flush if his has a BM and to report to staff when he does; pt verbalized understanding of those instructions. Will continue to monitor pt per Q15 minute face checks and monitor for safety and progress.

## 2021-01-26 ENCOUNTER — Inpatient Hospital Stay: Payer: Medicare Other

## 2021-01-26 DIAGNOSIS — F250 Schizoaffective disorder, bipolar type: Secondary | ICD-10-CM | POA: Diagnosis not present

## 2021-01-26 MED ORDER — OLANZAPINE 5 MG PO TBDP
10.0000 mg | ORAL_TABLET | Freq: Every day | ORAL | Status: DC
  Administered 2021-01-26: 10 mg via ORAL
  Filled 2021-01-26: qty 2

## 2021-01-26 MED ORDER — OXYBUTYNIN CHLORIDE ER 10 MG PO TB24
10.0000 mg | ORAL_TABLET | Freq: Every day | ORAL | Status: DC
  Administered 2021-01-26 – 2021-02-16 (×21): 10 mg via ORAL
  Filled 2021-01-26 (×24): qty 1

## 2021-01-26 MED ORDER — CLOZAPINE 100 MG PO TABS
300.0000 mg | ORAL_TABLET | Freq: Every day | ORAL | Status: DC
  Filled 2021-01-26: qty 3

## 2021-01-26 MED ORDER — RISPERIDONE 1 MG PO TABS
2.0000 mg | ORAL_TABLET | Freq: Every day | ORAL | Status: DC
  Administered 2021-01-26: 2 mg via ORAL
  Filled 2021-01-26: qty 2

## 2021-01-26 NOTE — Group Note (Signed)
[facility] LCSW Group Therapy Note    Group Date: 01/26/2021 Start Time: 1315 End Time: 1400  Type of Therapy and Topic:  Group Therapy:  Overcoming Obstacles  Participation Level:  [facility] PARTICIPATION LEVEL: Minimal   Description of Group:   In this group patients will be encouraged to explore what they see as obstacles to their own wellness and recovery. They will be guided to discuss their thoughts, feelings, and behaviors related to these obstacles. The group will process together ways to cope with barriers, with attention given to specific choices patients can make. Each patient will be challenged to identify changes they are motivated to make in order to overcome their obstacles. This group will be process-oriented, with patients participating in exploration of their own experiences as well as giving and receiving support and challenge from other group members.  Therapeutic Goals: 1. Patient will identify personal and current obstacles as they relate to admission. 2. Patient will identify barriers that currently interfere with their wellness or overcoming obstacles.  3. Patient will identify feelings, thought process and behaviors related to these barriers. 4. Patient will identify two changes they are willing to make to overcome these obstacles:    Summary of Patient Progress   Patient was present for the initial portion of group session. Patient participated in the ice breaker activity but left the session soon afterwards and did not return to group.   Therapeutic Modalities:   Cognitive Behavioral Therapy Solution Focused Therapy Motivational Interviewing Relapse Prevention Therapy    A [place], LCSWA

## 2021-01-26 NOTE — Plan of Care (Signed)
Patient endorses anxiety with this writer   Problem: Self-Concept: Goal: Level of anxiety will decrease Outcome: Not Progressing   

## 2021-01-26 NOTE — Plan of Care (Signed)
Patient is in and out of the room pacing in the hallway. Patient states " I am fighting with the voices sometimes telling me to do things like swallowing. I feel so depressed sometimes." Encouraged patient to come and talk to the staff. Patient contracts for safety. ADLS maintained. Attended groups. Appetite and energy level good. Support and encouragement given.

## 2021-01-26 NOTE — Progress Notes (Signed)
Patient calm and compliant during assessment denying SI/HI/AVH. Patient endorses anxiety. Pt observed interacting appropriately with staff and peers on the unit. Pt compliant with medication administration per MD orders except for the Clozaril. Pt had the understanding that his dose was going to be lowered not raised. When this writer gave patient information on his medication change, he refused it. Pt given education, support, and encouragement to be active in his treatment plan. Pt being monitored Q 15 minutes for safety per unit protocol. Pt remains safe on the unit.

## 2021-01-26 NOTE — Progress Notes (Signed)
[facility] MD Progress Note  01/26/2021 11:55 AM Lawrence Morgan  MRN:  [id number]  CC "I think I need to go to [facility]."  Subjective:  40 year old male with schizoaffective disorder, bipolar type presenting for aggression at group home and homicidal ideations. He had also swallowed three batteries, 1 one which was removed by endoscopy. KUB 8/27 shows that the other two batteries overlying rectum without obstruction. Will follow XRAY until passed. No acute events overnight, medication compliant aside from clozaril, and attending to ADLs. Patient seen one-on-one again today. He notes that he is feeling somewhat better, but still feels he needs "long-term treatment." He requests to be on Vitamin C, miralax, and his overactive bladder medication. MAR reviewed, Vitamin C and miralax were already in place. Home oxybutynin 10 mg QHS ordered. Today patient is agreeable to taking Clozaril 300 mg QHS as it is only 3 tabs instead of 6. He also requests that I speak to his guardian.   Contacted his guardian, Lawrence Morgan, [phone]. Father notes that his son has been calling him, and still sounds paranoid and far from baseline to him. He notes that Lawrence Morgan used to be on [place] LAI, and felt he did better with this medication. Lawrence Morgan was doing so well that his team tried to reduce his number of medications, and felt this led to recent aggression. He requests that I email his current medication list.  Principal Problem: Schizoaffective disorder, bipolar type (HCC) Diagnosis: Principal Problem:   Schizoaffective disorder, bipolar type (HCC) Active Problems:   Chronic constipation   GERD (gastroesophageal reflux disease)   Ingestion of foreign body  Total Time spent with patient: 30 minutes  Past Psychiatric History: See H&P  Past Medical History: History reviewed. No pertinent past medical history.  Past Surgical History:  Procedure Laterality Date   ESOPHAGOGASTRODUODENOSCOPY (EGD) WITH PROPOFOL N/A  01/20/2021   Procedure: ESOPHAGOGASTRODUODENOSCOPY (EGD) WITH PROPOFOL;  Surgeon: Midge Minium, MD;  Location: [facility] ENDOSCOPY;  Service: Endoscopy;  Laterality: N/A;   Family History: History reviewed. No pertinent family history. Family Psychiatric  History: See H&P Social History:  Social History   Substance and Sexual Activity  Alcohol Use Never     Social History   Substance and Sexual Activity  Drug Use Never    Social History   Socioeconomic History   Marital status: Single    Spouse name: Not on file   Number of children: Not on file   Years of education: Not on file   Highest education level: Not on file  Occupational History   Not on file  Tobacco Use   Smoking status: Every Day    Packs/day: 1.50    Types: Cigarettes   Smokeless tobacco: Never  Vaping Use   Vaping Use: Some days   Substances: Nicotine  Substance and Sexual Activity   Alcohol use: Never   Drug use: Never   Sexual activity: Not Currently  Other Topics Concern   Not on file  Social History Narrative   Not on file   Social Determinants of Health   Financial Resource Strain: Not on file  Food Insecurity: Not on file  Transportation Needs: Not on file  Physical Activity: Not on file  Stress: Not on file  Social Connections: Not on file   Additional Social History:                         Sleep: Fair  Appetite:  Fair  Current Medications: Current Facility-Administered  Medications  Medication Dose Route Frequency Provider Last Rate Last Admin   acetaminophen (TYLENOL) tablet 650 mg  650 mg Oral Q6H PRN [name], [name], MD   650 mg at 01/22/21 1249   alum & mag hydroxide-simeth (MAALOX/MYLANTA) 200-200-20 MG/5ML suspension 30 mL  30 mL Oral Q4H PRN [name], [name], MD       ascorbic acid (VITAMIN C) tablet 500 mg  500 mg Oral Daily Charm Rings, NP   500 mg at 01/26/21 0810   benztropine (COGENTIN) tablet 0.5 mg  0.5 mg Oral BID [name], [name], MD   0.5 mg at 01/26/21  0809   cloZAPine (CLOZARIL) tablet 300 mg  300 mg Oral QHS Jesse Sans, MD       docusate sodium (COLACE) capsule 200 mg  200 mg Oral BID [name], [name], MD   200 mg at 01/26/21 7829   hydrOXYzine (ATARAX/VISTARIL) tablet 50 mg  50 mg Oral Q6H PRN [name], [name], MD   50 mg at 01/22/21 0804   lithium carbonate (LITHOBID) CR tablet 600 mg  600 mg Oral QHS [name], [name], MD   600 mg at 01/25/21 2011   magnesium hydroxide (MILK OF MAGNESIA) suspension 30 mL  30 mL Oral Daily PRN [name], [name], MD       nicotine (NICODERM CQ - dosed in mg/24 hours) patch 14 mg  14 mg Transdermal Daily Jesse Sans, MD   14 mg at 01/23/21 0801   OLANZapine (ZYPREXA) tablet 10 mg  10 mg Oral Q6H PRN Jesse Sans, MD       OLANZapine zydis (ZYPREXA) disintegrating tablet 10 mg  10 mg Oral QHS Jesse Sans, MD       oxybutynin (DITROPAN-XL) 24 hr tablet 10 mg  10 mg Oral QHS Jesse Sans, MD       pantoprazole (PROTONIX) EC tablet 40 mg  40 mg Oral Daily [name], [name], MD   40 mg at 01/26/21 0809   polyethylene glycol (MIRALAX / GLYCOLAX) packet 17 g  17 g Oral Daily [name], [name], MD   17 g at 01/26/21 5621   risperiDONE (RISPERDAL) tablet 2 mg  2 mg Oral QHS Jesse Sans, MD       sertraline (ZOLOFT) tablet 100 mg  100 mg Oral Daily [name], [name], MD   100 mg at 01/26/21 0809   ziprasidone (GEODON) injection 20 mg  20 mg Intramuscular Q8H PRN Jesse Sans, MD        Lab Results:  No results found for this or any previous visit (from the past 48 hour(s)).   Blood Alcohol level:  Lab Results  Component Value Date   ETH <10 01/20/2021    Metabolic Disorder Labs: Lab Results  Component Value Date   HGBA1C 5.7 (H) 01/22/2021   MPG 116.89 01/22/2021   No results found for: PROLACTIN Lab Results  Component Value Date   CHOL 176 01/22/2021   TRIG 179 (H) 01/22/2021   HDL 47 01/22/2021   CHOLHDL 3.7 01/22/2021   VLDL 36 01/22/2021   LDLCALC 93 01/22/2021     Physical Findings: AIMS:  , ,  ,  ,    CIWA:    COWS:     Musculoskeletal: Strength & Muscle Tone: within normal limits Gait & Station: normal Patient leans: N/A  Psychiatric Specialty Exam:  Presentation  General Appearance: Appropriate for Environment  Eye Contact:Fair  Speech:Clear and Coherent  Speech Volume:Normal  Handedness:Right   Mood and Affect  Mood:Anxious  Affect:Constricted   Thought Process  Thought Processes:Coherent  Descriptions of Associations:Loose  Orientation:Full (Time, Place and Person)  Thought Content:Rumination  History of Schizophrenia/Schizoaffective disorder:Yes  Duration of Psychotic Symptoms:Greater than six months  Hallucinations:Visual hallucinations of cartoon characters, auditory hallucinations of voices making fun of him  Ideas of Reference:Paranoia  Suicidal Thoughts:Passive SI  Homicidal Thoughts:Denies   Sensorium  Memory:Immediate Good  Judgment:Impaired  Insight:Present   Executive Functions  Concentration:Fair  Attention Span:Fair  Recall:Fair  Fund of Knowledge:Fair  Language:Fair   Psychomotor Activity  Psychomotor Activity:Normal   Assets  Assets:Desire for Improvement; Financial Resources/Insurance; Housing; Resilience; Physical Health; Social Support   Sleep  Sleep:Fair, 6 hours    Physical Exam: Physical Exam ROS Blood pressure 124/80, pulse 82, temperature 97.9 F (36.6 C), temperature source Oral, resp. rate 18, height 5\' 10"  (1.778 m), weight 90.7 kg, SpO2 100 %. Body mass index is 28.7 kg/m.   Treatment Plan Summary: Daily contact with patient to assess and evaluate symptoms and progress in treatment and Medication managementPatient with schizoaffective disorder bipolar type with history of several hospitalizations.  Admitted for aggressive behavior, auditory and visual hallucinations in the setting of noncompliance with medications.  Patient also swallowed  batteries and required interventions for removal.  Not all batteries were able to be removed. KUB x-ray obtained 8/27 showed batteries overlying rectum without obstruction. Will repeat KUB today and continue following up until batteries have been evacuate.    Schizoaffective disorder/psychosis - Increase Clozapine tablet 300 mg oral daily at bedtime, level pending - Continue benzatropine tablet 0.5 mg oral 2 times daily prophylaxis for EPS -Decrease olanzapine Zydis 10 mg oral daily at bedtime with plan to taper and discontinue -- Start Risperdal 2 mg QHS with plan to transition to 9/27 per guardian request  Mood stabilization/depression - Continue sertraline tablet 100 mg oral daily - Continue lithium carbonate CR tablet 600 mg oral daily at bedtime. Check lithium level tonight  Chronic constipation - Continue docusate sodium capsule 200 mg oral 2 times daily - Continue MiraLAX 17 g oral daily  GERD - Continue Protonix EC tablet 40 mg daily  Anxiety/agitation - Continue hydroxyzine tablet 50 mg oral every 6 hours as needed - Continue lorazepam tablet 2 mg oral every 6 hours as needed for anxiety or sedation  - Continue olanzapine tablet 10 mg oral every 6 hours as needed for agitation - Continue ziprasidone injection 20 mg IM every 8 hours as needed for agitation  Smoking cessation/nicotine replacement - Continue nicotine transdermal patch 14 mg daily  PRN, Other  --Continue Tylenol 650 mg po every 6 hrs prn pain --Continue MAALOX/MYLANTA 30 mL po every 4 hrs prn indigestion --Continue Milk of Magnesia 30 mL po daily prn constipation     01/26/21: Psychiatric exam above reviewed and remains accurate. Assessment and plan above reviewed and updated.    01/28/21, MD 01/26/2021, 11:55 AM

## 2021-01-26 NOTE — Progress Notes (Signed)
Recreation Therapy Notes   Date: 01/26/2021  Time: 9:30 am   Location: Courtyard   Behavioral response: N/A   Intervention Topic: Social skills    Discussion/Intervention: Patient did not attend group.   Clinical Observations/Feedback:  Patient did not attend group.     LRT/CTRS             01/26/2021 12:36 PM

## 2021-01-27 DIAGNOSIS — F250 Schizoaffective disorder, bipolar type: Secondary | ICD-10-CM | POA: Diagnosis not present

## 2021-01-27 LAB — CBC WITH DIFFERENTIAL/PLATELET
Abs Immature Granulocytes: 0.01 10*3/uL (ref 0.00–0.07)
Basophils Absolute: 0 10*3/uL (ref 0.0–0.1)
Basophils Relative: 1 %
Eosinophils Absolute: 0 10*3/uL (ref 0.0–0.5)
Eosinophils Relative: 0 %
HCT: 45.2 % (ref 39.0–52.0)
Hemoglobin: 14.2 g/dL (ref 13.0–17.0)
Immature Granulocytes: 0 %
Lymphocytes Relative: 33 %
Lymphs Abs: 1.6 10*3/uL (ref 0.7–4.0)
MCH: 27.1 pg (ref 26.0–34.0)
MCHC: 31.4 g/dL (ref 30.0–36.0)
MCV: 86.3 fL (ref 80.0–100.0)
Monocytes Absolute: 0.5 10*3/uL (ref 0.1–1.0)
Monocytes Relative: 11 %
Neutro Abs: 2.7 10*3/uL (ref 1.7–7.7)
Neutrophils Relative %: 55 %
Platelets: 191 10*3/uL (ref 150–400)
RBC: 5.24 MIL/uL (ref 4.22–5.81)
RDW: 13.4 % (ref 11.5–15.5)
WBC: 4.8 10*3/uL (ref 4.0–10.5)
nRBC: 0 % (ref 0.0–0.2)

## 2021-01-27 LAB — CLOZAPINE (CLOZARIL)
Clozapine Lvl: 29 ng/mL — ABNORMAL LOW (ref 350–650)
NorClozapine: <20 ng/mL
Total(Cloz+Norcloz): <49 ng/mL

## 2021-01-27 LAB — LITHIUM LEVEL: Lithium Lvl: 0.2 mmol/L — ABNORMAL LOW (ref 0.60–1.20)

## 2021-01-27 MED ORDER — RISPERIDONE 1 MG PO TABS
3.0000 mg | ORAL_TABLET | Freq: Every day | ORAL | Status: DC
  Administered 2021-01-27 – 2021-01-28 (×2): 3 mg via ORAL
  Filled 2021-01-27 (×2): qty 3

## 2021-01-27 MED ORDER — OLANZAPINE 5 MG PO TBDP
5.0000 mg | ORAL_TABLET | Freq: Every day | ORAL | Status: DC
  Administered 2021-01-27 – 2021-01-29 (×3): 5 mg via ORAL
  Filled 2021-01-27 (×3): qty 1

## 2021-01-27 MED ORDER — CLOZAPINE 100 MG PO TABS
200.0000 mg | ORAL_TABLET | Freq: Every day | ORAL | Status: DC
  Filled 2021-01-27: qty 2

## 2021-01-27 MED ORDER — NICOTINE POLACRILEX 2 MG MT GUM
2.0000 mg | CHEWING_GUM | OROMUCOSAL | Status: DC | PRN
  Administered 2021-02-06 – 2021-02-14 (×2): 2 mg via ORAL
  Filled 2021-01-27 (×2): qty 1

## 2021-01-27 MED ORDER — SENNA 8.6 MG PO TABS
1.0000 | ORAL_TABLET | Freq: Every day | ORAL | Status: DC | PRN
  Administered 2021-02-07: 8.6 mg via ORAL
  Filled 2021-01-27: qty 1

## 2021-01-27 MED ORDER — SENNA 8.6 MG PO TABS: 1.0000 | ORAL_TABLET | Freq: Every day | ORAL | Status: DC | PRN

## 2021-01-27 NOTE — Plan of Care (Signed)
Patient endorses anxiety  Problem: Self-Concept: Goal: Level of anxiety will decrease Outcome: Not Progressing   

## 2021-01-27 NOTE — Progress Notes (Signed)
Recreation Therapy Notes  Date: 01/27/2021  Time: 9:45 am   Location: Courtyard   Behavioral response: N/A   Intervention Topic: Leisure   Discussion/Intervention: Patient did not attend group.   Clinical Observations/Feedback:  Patient did not attend group.     LRT/CTRS           01/27/2021 11:30 AM

## 2021-01-27 NOTE — BHH Counselor (Addendum)
CSW attempted contact with Lawrence Morgan [phone]). Unable to make contact but left HIPPA complaint voicemail.   Vilma Meckel. Algis Greenhouse, MSW, LCSW, LCAS 01/27/2021 3:50 PM   CSW contacted pt guardian/father, Lawrence Morgan at [phone]. CSW informed guardian that pt was interested in long-term treatment/psych hospitalization or going somewhere else. CSW explained that pt had been told that he did not meet the criteria for a long-term hospitalization. Lawrence Morgan asked if team could find him somewhere else to go. CSW explained that if pt can return to their group home then they are typically recommended to do that as finding new group home placement is not an appropriate reason for continued hospitalization. One which could be better handled on an outpatient basis by group home and outpatient ACTT providers. Lawrence Morgan agreed, stating that he did not have the time to stop what he was doing to search for somewhere for pt to go when he already has somewhere to stay. No other concerns expressed. Contact ended without incident.   Vilma Meckel. Algis Greenhouse, MSW, LCSW, LCAS 01/27/2021 4:19 PM

## 2021-01-27 NOTE — Progress Notes (Addendum)
Pharmacy - Clozapine     This patient's order has been reviewed for prescribing contraindications.   Labs:  ANC 8/23 3.0 K/uL 8/30 2.7 K/uL  01/27/21 Patient has not had therapy for over 48 hours due to refusing medication. Recommend restarting titration at lower dose to reduce risk of side effects  Last dose given 8/27 @ 2200 = 250 mg  01/27/21: Dose reduced from 250 > 200 mg    The medication is being dispensed pursuant to the FDA REMS suspension order of 04/18/20 that allows for dispensing without a patient REMS dispense authorization (RDA).   Next ANC 02/03/21.  Sharen Hones, PharmD, BCPS Clinical Pharmacist 01/27/2021 9:47 AM

## 2021-01-27 NOTE — Progress Notes (Signed)
Patient calm and compliant during assessment denying SI/HI/AVH. Patient endorses anxiety. Pt observed interacting appropriately with staff and peers on the unit. Pt compliant with medication administration per MD orders except for the Clozaril.  When this writer gave patient information on his medication change, he refused it. Pt given education, support, and encouragement to be active in his treatment plan. Pt being monitored Q 15 minutes for safety per unit protocol. Pt remains safe on the unit.

## 2021-01-27 NOTE — Progress Notes (Signed)
D: Pt alert and oriented. Pt rates depression 8/10, hopelessness 10/10, and anxiety 8/10. Pt reports energy level as normal and concentration as being poor. Pt reports sleep last night as being poor. Pt did receive medications for sleep and did not find them helpful. Pt reports experiencing 8/10 abdominal/back pain, prn meds offered and refused. Pt denies experiencing any SI/HI, or AH at this time, however endorses VH stating that he see shadows coming out of his wall trying to eat him.   Pt shared concerned about increase dosage on Clozaril. MD notified of pt's concern and dosage was adjusted. Pt affect/mood appears as anxious/worried/paranoid, however is pleasant. Pt likes to take his miralax with grape juice.  A: Scheduled medications administered to pt, per MD orders. Support and encouragement provided. Frequent verbal contact made. Routine safety checks conducted q15 minutes.   R: No adverse drug reactions noted. Pt verbally contracts for safety at this time. Pt complaint with medications. Pt interacts well with others on the unit. Pt remains safe at this time. Will continue to monitor.

## 2021-01-27 NOTE — Progress Notes (Signed)
[facility]  01/27/2021 3:06 PM Lawrence Morgan  MRN:  [id number]  Lawrence Morgan is a 40 year old male who presented to the ED via [facility] from [facility] where he was threatening to "cut people's heads off" and throwing chairs. At some point in the ED visit, It was discovered that he had swallowed some batteries.  Patient has a history of schizoaffective disorder with several prior admissions, as well as swallowing foreign bodies when he gets agitated.  Patient has a guardian, Kennyth ArnoldRicky Damon [phone]) and lives in group home, [facility] in [place].   CC: "I am feeling pretty good."   Subjective: Patient was seen and interviewed today.  He states that he is still "sees people coming out of the wall, like aliens."  He says he feels like they are going to try to eat him.  He realizes these are visual hallucinations.  He denies auditory hallucinations.  He states that he has some thoughts of self-harm, but feels safe on the unit.  He says he sometimes feels like "pulling people's heads off."  He states he feels he can tell staff if he has unsafe thoughts against himself or anyone else.  Patient has not had any unsafe behaviors on the unit.  He is not acting in a manic behavior.  He is attending to ADLs.  He has passed the battery per nursing staff.  Still some stool burden, Senokot added to his regimen as needed to encourage bowel movements.  Also encouraged patient to drink more fluids.  Patient smiled when writer told him his hair but denies.  He states that he is thinking about getting it all shaved off.  [facility], writer told patient that would not be happening from here.  Patient says Clozaril makes him too sleepy and wants dose decreased.  He refused Clozaril last night.  He has otherwise been taking medications as prescribed.  Clozaril decreased to 200 mg at night.  We will increase Risperdal to 3 mg.  Consider Invega LAI this week.  Lithium  level is 0.20 (L) today.  Did not change within dose today, in context of other medication changes.  Clozapine level was 29 (low) on 01/22/2021.  No new results.  Principal Problem: Schizoaffective disorder, bipolar type (HCC) Diagnosis: Principal Problem:   Schizoaffective disorder, bipolar type (HCC) Active Problems:   Chronic constipation   GERD (gastroesophageal reflux disease)   Ingestion of foreign body  Total Time spent with patient: 15 minutes  Past Psychiatric History: See H&P  Past Medical History: History reviewed. No pertinent past medical history.  Past Surgical History:  Procedure Laterality Date   ESOPHAGOGASTRODUODENOSCOPY (EGD) WITH PROPOFOL N/A 01/20/2021   Procedure: ESOPHAGOGASTRODUODENOSCOPY (EGD) WITH PROPOFOL;  Surgeon: [name], Darren, MD;  Location: [facility] ENDOSCOPY;  Service: Endoscopy;  Laterality: N/A;   Family History: History reviewed. No pertinent family history. Family Psychiatric  History: See H&P Social History:  Social History   Substance and Sexual Activity  Alcohol Use Never     Social History   Substance and Sexual Activity  Drug Use Never    Social History   Socioeconomic History   Marital status: Single    Spouse name: Not on file   Number of children: Not on file   Years of education: Not on file   Highest education level: Not on file  Occupational History   Not on file  Tobacco Use   Smoking status: Every Day  Packs/day: 1.50    Types: Cigarettes   Smokeless tobacco: Never  Vaping Use   Vaping Use: Some days   Substances: Nicotine  Substance and Sexual Activity   Alcohol use: Never   Drug use: Never   Sexual activity: Not Currently  Other Topics Concern   Not on file  Social History Narrative   Not on file   Social Determinants of Health   Financial Resource Strain: Not on file  Food Insecurity: Not on file  Transportation Needs: Not on file  Physical Activity: Not on file  Stress: Not on file  Social Connections:  Not on file   Additional Social History:   Sleep: Good  Appetite:  Good  Current Medications: Current Facility-Administered Medications  Medication Dose Route Frequency Provider Last Rate Last Admin   acetaminophen (TYLENOL) tablet 650 mg  650 mg Oral Q6H PRN [name], [name], MD   650 mg at 01/22/21 1249   alum & mag hydroxide-simeth (MAALOX/MYLANTA) 200-200-20 MG/5ML suspension 30 mL  30 mL Oral Q4H PRN [name], [name], MD       ascorbic acid (VITAMIN C) tablet 500 mg  500 mg Oral Daily Les Pou M, MD   500 mg at 01/27/21 5093   benztropine (COGENTIN) tablet 0.5 mg  0.5 mg Oral BID [name], [name], MD   0.5 mg at 01/27/21 0759   cloZAPine (CLOZARIL) tablet 200 mg  200 mg Oral QHS Gabriel Cirri F, NP       docusate sodium (COLACE) capsule 200 mg  200 mg Oral BID [name], [name], MD   200 mg at 01/27/21 0759   hydrOXYzine (ATARAX/VISTARIL) tablet 50 mg  50 mg Oral Q6H PRN [name], [name], MD   50 mg at 01/22/21 0804   lithium carbonate (LITHOBID) CR tablet 600 mg  600 mg Oral QHS [name], [name], MD   600 mg at 01/26/21 2114   magnesium hydroxide (MILK OF MAGNESIA) suspension 30 mL  30 mL Oral Daily PRN [name], [name], MD       nicotine (NICODERM CQ - dosed in mg/24 hours) patch 14 mg  14 mg Transdermal Daily Jesse Sans, MD   14 mg at 01/23/21 0801   nicotine polacrilex (NICORETTE) gum 2 mg  2 mg Oral PRN [name], NP       OLANZapine (ZYPREXA) tablet 10 mg  10 mg Oral Q6H PRN Jesse Sans, MD       OLANZapine zydis (ZYPREXA) disintegrating tablet 5 mg  5 mg Oral QHS Gabriel Cirri F, NP       oxybutynin (DITROPAN-XL) 24 hr tablet 10 mg  10 mg Oral QHS Jesse Sans, MD   10 mg at 01/26/21 2114   pantoprazole (PROTONIX) EC tablet 40 mg  40 mg Oral Daily [name], [name], MD   40 mg at 01/27/21 0800   polyethylene glycol (MIRALAX / GLYCOLAX) packet 17 g  17 g Oral Daily [name], [name], MD   17 g at 01/27/21 0800   risperiDONE (RISPERDAL) tablet 3 mg  3 mg  Oral QHS Gabriel Cirri F, NP       senna (SENOKOT) tablet 8.6 mg  1 tablet Oral Daily PRN Gabriel Cirri F, NP       sertraline (ZOLOFT) tablet 100 mg  100 mg Oral Daily [name], [name], MD   100 mg at 01/27/21 0800   ziprasidone (GEODON) injection 20 mg  20 mg Intramuscular Q8H PRN Les Pou  M, MD        Lab Results:  Results for orders placed or performed during the hospital encounter of 01/21/21 (from the past 48 hour(s))  Lithium level     Status: Abnormal   Collection Time: 01/27/21  8:56 AM  Result Value Ref Range   Lithium Lvl 0.20 (L) 0.60 - 1.20 mmol/L    Comment: Performed at [facility], [address] [place], [place] [address]  CBC with Differential/Platelet     Status: None   Collection Time: 01/27/21  8:56 AM  Result Value Ref Range   WBC 4.8 4.0 - 10.5 K/uL   RBC 5.24 4.22 - 5.81 MIL/uL   Hemoglobin 14.2 13.0 - 17.0 g/dL   HCT 03.5 00.9 - 38.1 %   MCV 86.3 80.0 - 100.0 fL   MCH 27.1 26.0 - 34.0 pg   MCHC 31.4 30.0 - 36.0 g/dL   RDW 82.9 93.7 - 16.9 %   Platelets 191 150 - 400 K/uL   nRBC 0.0 0.0 - 0.2 %   Neutrophils Relative % 55 %   Neutro Abs 2.7 1.7 - 7.7 K/uL   Lymphocytes Relative 33 %   Lymphs Abs 1.6 0.7 - 4.0 K/uL   Monocytes Relative 11 %   Monocytes Absolute 0.5 0.1 - 1.0 K/uL   Eosinophils Relative 0 %   Eosinophils Absolute 0.0 0.0 - 0.5 K/uL   Basophils Relative 1 %   Basophils Absolute 0.0 0.0 - 0.1 K/uL   Immature Granulocytes 0 %   Abs Immature Granulocytes 0.01 0.00 - 0.07 K/uL    Comment: Performed at [facility], [address] [place], [place] [address]    Blood Alcohol level:  Lab Results  Component Value Date   [facility] <10 01/20/2021    Metabolic Disorder Labs: Lab Results  Component Value Date   HGBA1C 5.7 (H) 01/22/2021   MPG 116.89 01/22/2021   No results found for: PROLACTIN Lab Results  Component Value Date   CHOL 176 01/22/2021   TRIG 179 (H) 01/22/2021   HDL 47 01/22/2021   CHOLHDL  3.7 01/22/2021   VLDL 36 01/22/2021   LDLCALC 93 01/22/2021    Physical Findings: AIMS:  , ,  ,  ,    CIWA:    COWS:     Musculoskeletal: Strength & Muscle Tone: within normal limits Gait & Station: normal Patient leans: N/A  Psychiatric Specialty Exam:  Presentation  General Appearance: Appropriate for Environment  Eye Contact:Good  Speech:Normal Rate  Speech Volume:Normal  Handedness:Right   Mood and Affect  Mood:Depressed  Affect:Constricted   Thought Process  Thought Processes:Coherent  Descriptions of Associations:Loose  Orientation:Full (Time, Place and Person)  Thought Content:Rumination  History of Schizophrenia/Schizoaffective disorder:Yes  Duration of Psychotic Symptoms:Greater than six months  Hallucinations:Hallucinations: None Description of Visual Hallucinations: "Aliens coming out of the wall to eat me" Ideas of Reference:Paranoia  Suicidal Thoughts:Suicidal Thoughts: Yes, Passive (Verbally contracts for safety.  Feels safe on the unit) Homicidal Thoughts:Homicidal Thoughts: No  Sensorium  Memory:Immediate Good  Judgment:Impaired  Insight:Present   Executive Functions  Concentration:Fair  Attention Span:Fair  Recall:Fair  Fund of Knowledge:Fair  Language:Fair   Psychomotor Activity  Psychomotor Activity: No data recorded  Assets  Assets:Desire for Improvement; Financial Resources/Insurance; Housing; Resilience; Physical Health; Social Support   Sleep  Sleep: Sleep: Good Number of Hours of Sleep: 8   Physical Exam: Physical Exam Vitals and nursing note reviewed.  HENT:     Head: Normocephalic.  Nose: No congestion or rhinorrhea.  Eyes:     General:        Right eye: No discharge.        Left eye: No discharge.  Cardiovascular:     Rate and Rhythm: Normal rate.     Pulses: Normal pulses.  Pulmonary:     Effort: Pulmonary effort is normal.  Musculoskeletal:        General: Normal range of motion.      Cervical back: Normal range of motion.  Skin:    General: Skin is warm and dry.  Neurological:     Mental Status: He is alert and oriented to person, place, and time.  Psychiatric:        Mood and Affect: Mood is anxious.        Behavior: Behavior normal. Behavior is cooperative.   Review of Systems  Gastrointestinal:  Positive for constipation.  Musculoskeletal:  Positive for myalgias.  Psychiatric/Behavioral:  Positive for depression and hallucinations. The patient is nervous/anxious.   All other systems reviewed and are negative. Blood pressure 119/75, pulse 78, temperature 98.5 F (36.9 C), temperature source Oral, resp. rate 18, height 5\' 10"  (1.778 m), weight 90.7 kg, SpO2 100 %. Body mass index is 28.7 kg/m.   Treatment Plan Summary: Daily contact with patient to assess and evaluate symptoms and progress in treatment and Medication management  01/27/2021 Update: Patient with diagnosis of schizoaffective disorder, bipolar type.  Currently making medication changes, decreasing Zyprexa, and increased Risperdal in preparation for getting Invega LAI.  Per nursing, batteries have passed and stool.  Patient still has stool burden, added senna to bowel regimen to encourage evacuation.  Encouraged patient to drink fluids.  Schizoaffective disorder/psychosis - Continue Clozapine tablet , decreased to 200 mg oral daily at bedtime - Continue benzatropine tablet 0.5 mg oral 2 times daily prophylaxis for EPS -Continue to decrease olanzapine Zydis, 5 mg oral daily at bedtime - Continue Risperdal, increased to 3 mg daily at bedtime  Mood stabilization/depression - Continue sertraline tablet 100 mg oral daily - Continue lithium carbonate CR tablet 600 mg oral daily at bedtime  Chronic constipation - Continue docusate sodium capsule 200 mg oral 2 times daily - Continue MiraLAX 17 g oral daily -Start Senokot tablet 8.6 mg oral daily as needed for constipation  Overactive bladder -  Continue Ditropan XL 24-hour tablet 10 mg p.o. at bedtime  Supplementation - Continue Vitamin C tablet 500 mg oral daily  GERD - Continue Protonix EC tablet 40 mg daily  Anxiety/agitation - Continue hydroxyzine tablet 50 mg oral every 6 hours as needed - Continue lorazepam tablet 2 mg oral every 6 hours as needed for anxiety or sedation  - Continue olanzapine tablet 10 mg oral every 6 hours as needed for agitation - Continue ziprasidone injection 20 mg IM every 8 hours as needed for agitation  Smoking cessation/nicotine replacement - Continue nicotine transdermal patch 14 mg daily -Start nicotine gum 2 mg oral as needed  PRN, Other  --Continue Tylenol 650 mg po every 6 hrs prn pain --Continue MAALOX/MYLANTA 30 mL po every 4 hrs prn indigestion --Continue Milk of Magnesia 30 mL po daily prn constipation       01/29/2021, NP 01/27/2021, 3:06 PM

## 2021-01-27 NOTE — Group Note (Signed)
[facility] LCSW Group Therapy Note   Group Date: 01/27/2021 Start Time: 1:00PM End Time: 2:15PM  Type of Therapy/Topic:  Group Therapy:  Feelings about Diagnosis  Participation Level:  None   Description of Group:    This group will allow patients to explore their thoughts and feelings about diagnoses they have received. Patients will be guided to explore their level of understanding and acceptance of these diagnoses. Facilitator will encourage patients to process their thoughts and feelings about the reactions of others to their diagnosis, and will guide patients in identifying ways to discuss their diagnosis with significant others in their lives. This group will be process-oriented, with patients participating in exploration of their own experiences as well as giving and receiving support and challenge from other group members.   Therapeutic Goals: 1. Patient will demonstrate understanding of diagnosis as evidence by identifying two or more symptoms of the disorder:  2. Patient will be able to express two feelings regarding the diagnosis 3. Patient will demonstrate ability to communicate their needs through discussion and/or role plays  Summary of Patient Progress: Patient was present in group.  Patient was awake and appeared attentive though he did not engage in group discussion.    Therapeutic Modalities:   Cognitive Behavioral Therapy Brief Therapy Feelings Identification    Harden Mo, LCSW

## 2021-01-28 DIAGNOSIS — F250 Schizoaffective disorder, bipolar type: Secondary | ICD-10-CM | POA: Diagnosis not present

## 2021-01-28 MED ORDER — TRAZODONE HCL 50 MG PO TABS
50.0000 mg | ORAL_TABLET | Freq: Every evening | ORAL | Status: DC | PRN
  Administered 2021-01-28 – 2021-02-12 (×4): 50 mg via ORAL
  Filled 2021-01-28 (×5): qty 1

## 2021-01-28 NOTE — [facility] Counselor (Addendum)
CSW met with pt to discuss conversation with guardian. CSW explained that acceptance into long-term mental health facility was unlikely. Pt expressed that he would fall into a deep depression if he had to go back to his previous group home. CSW again restated that long-term mental health hospitalization was not likely to accept him and since he has a disposition already, that he could not be kept here just for placement. Pt again states that team needs to work with him and asks if referral could be made but team just did not want to do it. CSW explained that team had to be honest with him regarding the referral process and that it would be a disservice to not let him know. Pt continues to ask if referral can be made. CSW stated that referral could be made but it was ultimately up to his guardian. CSW explained that worst case scenario is that pt will have to return to his group home and they (group home owner/manager) in conjunction with his ACTT could pursue other placement options. Pt again stated that he does not want to return to his group home and will become depressed if he has to return there. CSW stated that ultimately the decision is his guardian's and when he will leave is up to the psych provider. CSW informed pt that provider would be made aware of his concerns. He agreed. No other concerns expressed. Contact ended without incident.   Chalmers Guest. Guerry Bruin, MSW, [place], [place] 01/28/2021 3:23 PM  CSW spoke with Barnett Applebaum ([facility] team lead at [phone]) and gave her an update regarding pt.   CSW spoke with Angelina Sheriff (group home coordinator at [phone]) and gave her an update regarding pt.   CSW attempted contact with guardian, Rob Hickman [phone]) to inquire if team could do referral for long-term mental health for pt. Unable to make contact but detail left on voicemail.   Chalmers Guest. Guerry Bruin, MSW, [place], [place] 01/28/2021 3:41 PM

## 2021-01-28 NOTE — Plan of Care (Signed)
Patient endorses anxiety  Problem: Self-Concept: Goal: Level of anxiety will decrease Outcome: Not Progressing   

## 2021-01-28 NOTE — Plan of Care (Signed)
Patient denies SI/HI/AH and contracts for safety. Patient did endorse Visual Hallucinations about cartoon characters. Patient continues to refuse Clozaril at bedtime. Writer did educate patient of importance to comply with medications MD ordered. Patient verbalized "I will think about it" Patient did comply with scheduled medications per MD orders. Patient denies any adverse reactions from medications administered. Patient has minimal participation in the therapeutic milieu but did participate in group therapy. Patient did verbalize looking at [facility] [place] as healthy coping skills he will utilize upon discharge. Patient verbalized he feels safe on the unit.  Will continue to monitor with q 15 minute safety checks.    Problem: Education: Goal: Ability to state activities that reduce stress will improve Outcome: Progressing   Problem: Coping: Goal: Ability to identify and develop effective coping behavior will improve Outcome: Progressing   Problem: Self-Concept: Goal: Ability to identify factors that promote anxiety will improve Outcome: Progressing Goal: Level of anxiety will decrease Outcome: Progressing Goal: Ability to modify response to factors that promote anxiety will improve Outcome: Progressing

## 2021-01-28 NOTE — BH IP Treatment Plan (Signed)
Interdisciplinary Treatment and Diagnostic Plan Update  01/28/2021 Time of Session: 8:30 AM Lawrence Morgan MRN: [id number]  Principal Diagnosis: Schizoaffective disorder, bipolar type (HCC)  Secondary Diagnoses: Principal Problem:   Schizoaffective disorder, bipolar type (HCC) Active Problems:   Chronic constipation   GERD (gastroesophageal reflux disease)   Ingestion of foreign body   Current Medications:  Current Facility-Administered Medications  Medication Dose Route Frequency Provider Last Rate Last Admin   acetaminophen (TYLENOL) tablet 650 mg  650 mg Oral Q6H PRN [name], [name], MD   650 mg at 01/22/21 1249   alum & mag hydroxide-simeth (MAALOX/MYLANTA) 200-200-20 MG/5ML suspension 30 mL  30 mL Oral Q4H PRN [name], [name], MD       ascorbic acid (VITAMIN C) tablet 500 mg  500 mg Oral Daily Les Pou M, MD   500 mg at 01/28/21 0745   benztropine (COGENTIN) tablet 0.5 mg  0.5 mg Oral BID [name], [name], MD   0.5 mg at 01/28/21 0746   cloZAPine (CLOZARIL) tablet 200 mg  200 mg Oral QHS Gabriel Cirri F, NP       docusate sodium (COLACE) capsule 200 mg  200 mg Oral BID [name], [name], MD   200 mg at 01/28/21 0747   hydrOXYzine (ATARAX/VISTARIL) tablet 50 mg  50 mg Oral Q6H PRN [name], [name], MD   50 mg at 01/22/21 0804   lithium carbonate (LITHOBID) CR tablet 600 mg  600 mg Oral QHS [name], [name], MD   600 mg at 01/27/21 2123   magnesium hydroxide (MILK OF MAGNESIA) suspension 30 mL  30 mL Oral Daily PRN [name], [name], MD       nicotine (NICODERM CQ - dosed in mg/24 hours) patch 14 mg  14 mg Transdermal Daily Jesse Sans, MD   14 mg at 01/23/21 0801   nicotine polacrilex (NICORETTE) gum 2 mg  2 mg Oral PRN [name], NP       OLANZapine (ZYPREXA) tablet 10 mg  10 mg Oral Q6H PRN Jesse Sans, MD       OLANZapine zydis (ZYPREXA) disintegrating tablet 5 mg  5 mg Oral QHS Gabriel Cirri F, NP   5 mg at 01/27/21 2123   oxybutynin (DITROPAN-XL) 24 hr  tablet 10 mg  10 mg Oral QHS Jesse Sans, MD   10 mg at 01/27/21 2123   pantoprazole (PROTONIX) EC tablet 40 mg  40 mg Oral Daily [name], [name], MD   40 mg at 01/28/21 0747   polyethylene glycol (MIRALAX / GLYCOLAX) packet 17 g  17 g Oral Daily [name], [name], MD   17 g at 01/28/21 0746   risperiDONE (RISPERDAL) tablet 3 mg  3 mg Oral QHS Gabriel Cirri F, NP   3 mg at 01/27/21 2123   senna (SENOKOT) tablet 8.6 mg  1 tablet Oral Daily PRN [name], NP       sertraline (ZOLOFT) tablet 100 mg  100 mg Oral Daily [name], [name], MD   100 mg at 01/28/21 0747   ziprasidone (GEODON) injection 20 mg  20 mg Intramuscular Q8H PRN Jesse Sans, MD       PTA Medications: Medications Prior to Admission  Medication Sig Dispense Refill Last Dose   benztropine (COGENTIN) 1 MG tablet Take 1 mg by mouth 2 (two) times daily.      cloZAPine (CLOZARIL) 100 MG tablet Take 500 mg by mouth at bedtime.  famotidine (PEPCID) 20 MG tablet Take 20 mg by mouth daily.      fluvoxaMINE (LUVOX) 50 MG tablet Take 50 mg by mouth at bedtime.      lithium 300 MG tablet Take 600 mg by mouth 2 (two) times daily.      OLANZapine (ZYPREXA) 15 MG tablet Take 15 mg by mouth at bedtime.      oxybutynin (DITROPAN-XL) 10 MG 24 hr tablet Take 10 mg by mouth daily.       Patient Stressors: Health problems Other: SI/HI/AVH  Patient Strengths: Barrister's clerk for treatment/growth  Treatment Modalities: Medication Management, Group therapy, Case management,  1 to 1 session with clinician, Psychoeducation, Recreational therapy.   Physician Treatment Plan for Primary Diagnosis: Schizoaffective disorder, bipolar type (HCC) Long Term Goal(s): Improvement in symptoms so as ready for discharge   Short Term Goals: Ability to identify changes in lifestyle to reduce recurrence of condition will improve Ability to disclose and discuss suicidal ideas Ability to demonstrate self-control will  improve Ability to identify and develop effective coping behaviors will improve Ability to maintain clinical measurements within normal limits will improve Compliance with prescribed medications will improve  Medication Management: Evaluate patient's response, side effects, and tolerance of medication regimen.  Therapeutic Interventions: 1 to 1 sessions, Unit Group sessions and Medication administration.  Evaluation of Outcomes: Progressing  Physician Treatment Plan for Secondary Diagnosis: Principal Problem:   Schizoaffective disorder, bipolar type (HCC) Active Problems:   Chronic constipation   GERD (gastroesophageal reflux disease)   Ingestion of foreign body  Long Term Goal(s): Improvement in symptoms so as ready for discharge   Short Term Goals: Ability to identify changes in lifestyle to reduce recurrence of condition will improve Ability to disclose and discuss suicidal ideas Ability to demonstrate self-control will improve Ability to identify and develop effective coping behaviors will improve Ability to maintain clinical measurements within normal limits will improve Compliance with prescribed medications will improve     Medication Management: Evaluate patient's response, side effects, and tolerance of medication regimen.  Therapeutic Interventions: 1 to 1 sessions, Unit Group sessions and Medication administration.  Evaluation of Outcomes: Progressing   RN Treatment Plan for Primary Diagnosis: Schizoaffective disorder, bipolar type (HCC) Long Term Goal(s): Knowledge of disease and therapeutic regimen to maintain health will improve  Short Term Goals: Ability to verbalize frustration and anger appropriately will improve, Ability to demonstrate self-control, Ability to participate in decision making will improve, Ability to disclose and discuss suicidal ideas, Ability to identify and develop effective coping behaviors will improve, and Compliance with prescribed  medications will improve  Medication Management: RN will administer medications as ordered by provider, will assess and evaluate patient's response and provide education to patient for prescribed medication. RN will report any adverse and/or side effects to prescribing provider.  Therapeutic Interventions: 1 on 1 counseling sessions, Psychoeducation, Medication administration, Evaluate responses to treatment, Monitor vital signs and CBGs as ordered, Perform/monitor CIWA, COWS, AIMS and Fall Risk screenings as ordered, Perform wound care treatments as ordered.  Evaluation of Outcomes: Progressing   LCSW Treatment Plan for Primary Diagnosis: Schizoaffective disorder, bipolar type (HCC) Long Term Goal(s): Safe transition to appropriate next level of care at discharge, Engage patient in therapeutic group addressing interpersonal concerns.  Short Term Goals: Engage patient in aftercare planning with referrals and resources, Increase social support, Facilitate acceptance of mental health diagnosis and concerns, Identify triggers associated with mental health/substance abuse issues, and Increase skills for wellness and recovery  Therapeutic Interventions: Assess for all discharge needs, 1 to 1 time with Child psychotherapist, Explore available resources and support systems, Assess for adequacy in community support network, Educate family and significant other(s) on suicide prevention, Complete Psychosocial Assessment, Interpersonal group therapy.  Evaluation of Outcomes: Progressing   Progress in Treatment: Attending groups: Yes. Participating in groups: Yes. Taking medication as prescribed: Yes. Toleration medication: Yes. Family/Significant other contact made: Yes, individual(s) contacted:  Tawni Carnes, father/guardian. Patient understands diagnosis: No. Discussing patient identified problems/goals with staff: Yes. Medical problems stabilized or resolved: Yes. Denies suicidal/homicidal ideation:  Yes. Issues/concerns per patient self-inventory: No. Other: None.  New problem(s) identified: No, Describe:  none.  New Short Term/Long Term Goal(s): elimination of symptoms of psychosis, medication management for mood stabilization; development of comprehensive mental wellness plan. Update 01/28/21: None  Patient Goals: "Get in contact with my dad and get long-term treatment."  Pt declined to sign treatment team form. Update 01/28/21: None  Discharge Plan or Barriers: Pt specifically mentions going to [facility]. He was informed that he did not meet criteria for [facility] level of care. CSW will assist pt, along with guardian feedback/approval, with development of an appropriate aftercare/discharge plan. Update 01/28/21: CSW sent referral to [facility] per pt's request. Guardian was notified. Pt was notified that placement to [facility] is unlikely. CSW was informed by group home that pt can return when psychiatrically cleared.   Reason for Continuation of Hospitalization: Aggression Hallucinations Medication stabilization  Estimated Length of Stay: 1-7 days  Scribe for Treatment Team:  A [place], Theresia Majors 01/28/2021 2:04 PM

## 2021-01-28 NOTE — Progress Notes (Signed)
Recreation Therapy Notes  Date: 01/28/2021  Time: 9:45 am   Location: Craft room  Behavioral response: N/A   Intervention Topic: Stress Management    Discussion/Intervention: Patient did not attend group.   Clinical Observations/Feedback:  Patient did not attend group.     LRT/CTRS           01/28/2021 12:06 PM

## 2021-01-28 NOTE — Progress Notes (Signed)
Patient ID: Lawrence Morgan, male   DOB: Oct 26, 1980, 40 y.o.   MRN: [id number] [facility] MD Progress Note  01/28/2021 4:19 PM Lawrence Morgan  MRN:  [id number]  Lawrence Morgan is a 40 year old male who presented to the ED via [facility] from [facility] where he was threatening to "cut people's heads off" and throwing chairs. At some point in the ED visit, It was discovered that he had swallowed some batteries.  Patient has a history of schizoaffective disorder with several prior admissions, as well as swallowing foreign bodies when he gets agitated.  Patient has a guardian, Kennyth Arnold [phone]) and lives in group home, [facility] in [place].   CC: "I still think I need to go to [facility] for them to get my medicine straightened out."   Subjective: Patient was seen and interviewed today.  He is perseverative on being discharge to [facility].  Discussed at length that he will not be going to [facility] from here.  Talked to patient about freedom that he has in his group home.  Patient states that he just does not like taking "all this medicine."  Reminding patient that he thought [facility] would be "straightening his medicine out" I talked about that that is what we are trying to do and we have lowered the dosage of Clozaril like he asks and he is still refusing.  Patient states that he does not want an injection either, but then later said he might be willing to do that.  Patient states that he feels safe on the unit, does not have any thoughts of self-harm.  But he says "I might swallow some more batteries when I am discharged."  Patient continues to repeat the same about seeing shadows and aliens coming out of the wall.  He denies any thoughts of trying to hurt anybody.  He says he "sometimes hears other voices."  Patient has been compliant with medications, aside from the Clozaril.  He is generally calm and cooperative.  He is visible in the milieu, walking around, but little interaction with peers.  He will talk to  staff occasionally.  Patient is attending to ADLs.  No unsafe behavior has been noted on the unit. Has not needed prns for agitation. Reports "ok" sleep and good appetite.    Principal Problem: Schizoaffective disorder, bipolar type (HCC) Diagnosis: Principal Problem:   Schizoaffective disorder, bipolar type (HCC) Active Problems:   Chronic constipation   GERD (gastroesophageal reflux disease)   Ingestion of foreign body  Total Time spent with patient: 15 minutes  Past Psychiatric History: See H&P  Past Medical History: History reviewed. No pertinent past medical history.  Past Surgical History:  Procedure Laterality Date   ESOPHAGOGASTRODUODENOSCOPY (EGD) WITH PROPOFOL N/A 01/20/2021   Procedure: ESOPHAGOGASTRODUODENOSCOPY (EGD) WITH PROPOFOL;  Surgeon: Midge Minium, MD;  Location: [facility] ENDOSCOPY;  Service: Endoscopy;  Laterality: N/A;   Family History: History reviewed. No pertinent family history. Family Psychiatric  History: See H&P Social History:  Social History   Substance and Sexual Activity  Alcohol Use Never     Social History   Substance and Sexual Activity  Drug Use Never    Social History   Socioeconomic History   Marital status: Single    Spouse name: Not on file   Number of children: Not on file   Years of education: Not on file   Highest education level: Not on file  Occupational History   Not on file  Tobacco Use   Smoking status: Every  Day    Packs/day: 1.50    Types: Cigarettes   Smokeless tobacco: Never  Vaping Use   Vaping Use: Some days   Substances: Nicotine  Substance and Sexual Activity   Alcohol use: Never   Drug use: Never   Sexual activity: Not Currently  Other Topics Concern   Not on file  Social History Narrative   Not on file   Social Determinants of Health   Financial Resource Strain: Not on file  Food Insecurity: Not on file  Transportation Needs: Not on file  Physical Activity: Not on file  Stress: Not on file   Social Connections: Not on file   Additional Social History:   Sleep: Good  Appetite:  Good  Current Medications: Current Facility-Administered Medications  Medication Dose Route Frequency Provider Last Rate Last Admin   acetaminophen (TYLENOL) tablet 650 mg  650 mg Oral Q6H PRN [name], Jackquline DenmarkJohn T, MD   650 mg at 01/22/21 1249   alum & mag hydroxide-simeth (MAALOX/MYLANTA) 200-200-20 MG/5ML suspension 30 mL  30 mL Oral Q4H PRN [name], Jackquline DenmarkJohn T, MD       ascorbic acid (VITAMIN C) tablet 500 mg  500 mg Oral Daily [name], Megan M, MD   500 mg at 01/28/21 0745   benztropine (COGENTIN) tablet 0.5 mg  0.5 mg Oral BID [name], [name], MD   0.5 mg at 01/28/21 0746   cloZAPine (CLOZARIL) tablet 200 mg  200 mg Oral QHS Gabriel CirriBarthold,  F, NP       docusate sodium (COLACE) capsule 200 mg  200 mg Oral BID [name], [name], MD   200 mg at 01/28/21 0747   hydrOXYzine (ATARAX/VISTARIL) tablet 50 mg  50 mg Oral Q6H PRN [name], Jackquline DenmarkJohn T, MD   50 mg at 01/22/21 0804   lithium carbonate (LITHOBID) CR tablet 600 mg  600 mg Oral QHS [name], [name], MD   600 mg at 01/27/21 2123   magnesium hydroxide (MILK OF MAGNESIA) suspension 30 mL  30 mL Oral Daily PRN [name], Jackquline DenmarkJohn T, MD       nicotine polacrilex (NICORETTE) gum 2 mg  2 mg Oral PRN Vanetta MuldersBarthold,  F, NP       OLANZapine (ZYPREXA) tablet 10 mg  10 mg Oral Q6H PRN [name], Megan M, MD       OLANZapine zydis (ZYPREXA) disintegrating tablet 5 mg  5 mg Oral QHS Gabriel CirriBarthold,  F, NP   5 mg at 01/27/21 2123   oxybutynin (DITROPAN-XL) 24 hr tablet 10 mg  10 mg Oral QHS [name], Megan M, MD   10 mg at 01/27/21 2123   pantoprazole (PROTONIX) EC tablet 40 mg  40 mg Oral Daily [name], Jackquline DenmarkJohn T, MD   40 mg at 01/28/21 0747   polyethylene glycol (MIRALAX / GLYCOLAX) packet 17 g  17 g Oral Daily [name], [name], MD   17 g at 01/28/21 0746   risperiDONE (RISPERDAL) tablet 3 mg  3 mg Oral QHS Gabriel CirriBarthold,  F, NP   3 mg at 01/27/21 2123   senna (SENOKOT) tablet 8.6 mg  1  tablet Oral Daily PRN Gabriel CirriBarthold,  F, NP       sertraline (ZOLOFT) tablet 100 mg  100 mg Oral Daily [name], [name], MD   100 mg at 01/28/21 0747   ziprasidone (GEODON) injection 20 mg  20 mg Intramuscular Q8H PRN [name], Megan M, MD        Lab Results:  Results for orders placed or performed during  the hospital encounter of 01/21/21 (from the past 48 hour(s))  Lithium level     Status: Abnormal   Collection Time: 01/27/21  8:56 AM  Result Value Ref Range   Lithium Lvl 0.20 (L) 0.60 - 1.20 mmol/L    Comment: Performed at [facility], [address] [place], [place] [address]  CBC with Differential/Platelet     Status: None   Collection Time: 01/27/21  8:56 AM  Result Value Ref Range   WBC 4.8 4.0 - 10.5 K/uL   RBC 5.24 4.22 - 5.81 MIL/uL   Hemoglobin 14.2 13.0 - 17.0 g/dL   HCT 12.4 58.0 - 99.8 %   MCV 86.3 80.0 - 100.0 fL   MCH 27.1 26.0 - 34.0 pg   MCHC 31.4 30.0 - 36.0 g/dL   RDW 33.8 25.0 - 53.9 %   Platelets 191 150 - 400 K/uL   nRBC 0.0 0.0 - 0.2 %   Neutrophils Relative % 55 %   Neutro Abs 2.7 1.7 - 7.7 K/uL   Lymphocytes Relative 33 %   Lymphs Abs 1.6 0.7 - 4.0 K/uL   Monocytes Relative 11 %   Monocytes Absolute 0.5 0.1 - 1.0 K/uL   Eosinophils Relative 0 %   Eosinophils Absolute 0.0 0.0 - 0.5 K/uL   Basophils Relative 1 %   Basophils Absolute 0.0 0.0 - 0.1 K/uL   Immature Granulocytes 0 %   Abs Immature Granulocytes 0.01 0.00 - 0.07 K/uL    Comment: Performed at [facility], [address] [place], [place] [address]    Blood Alcohol level:  Lab Results  Component Value Date   [facility] <10 01/20/2021    Metabolic Disorder Labs: Lab Results  Component Value Date   HGBA1C 5.7 (H) 01/22/2021   MPG 116.89 01/22/2021   No results found for: PROLACTIN Lab Results  Component Value Date   CHOL 176 01/22/2021   TRIG 179 (H) 01/22/2021   HDL 47 01/22/2021   CHOLHDL 3.7 01/22/2021   VLDL 36 01/22/2021   LDLCALC 93 01/22/2021     Physical Findings: AIMS:  , ,  ,  ,    CIWA:    COWS:     Musculoskeletal: Strength & Muscle Tone: within normal limits Gait & Station: normal Patient leans: N/A  Psychiatric Specialty Exam:  Presentation  General Appearance: Appropriate for Environment  Eye Contact:Good  Speech:Normal Rate  Speech Volume:Normal  Handedness:Right   Mood and Affect  Mood:Depressed  Affect:Constricted   Thought Process  Thought Processes:Coherent  Descriptions of Associations:Loose  Orientation:Full (Time, Place and Person)  Thought Content:Rumination  History of Schizophrenia/Schizoaffective disorder:Yes  Duration of Psychotic Symptoms:Greater than six months  Hallucinations:Hallucinations: None Description of Visual Hallucinations: "Aliens coming out of the wall to eat me" Ideas of Reference:Paranoia  Suicidal Thoughts:Suicidal Thoughts: Yes, Passive (Verbally contracts for safety.  Feels safe on the unit) Homicidal Thoughts:Homicidal Thoughts: No  Sensorium  Memory:Immediate Good  Judgment:Impaired  Insight:Poor   Executive Functions  Concentration:Fair  Attention Span:Fair  Recall:Fair  Fund of Knowledge:Fair  Language:Fair   Psychomotor Activity  Psychomotor Activity: No data recorded  Assets  Assets:Desire for Improvement; Financial Resources/Insurance; Housing; Resilience; Physical Health; Social Support   Sleep  Sleep: Sleep: Fair Number of Hours of Sleep: 6.5   Physical Exam: Physical Exam Vitals and nursing note reviewed.  HENT:     Head: Normocephalic.     Nose: No congestion or rhinorrhea.  Eyes:     General:  Right eye: No discharge.        Left eye: No discharge.  Cardiovascular:     Rate and Rhythm: Normal rate.     Pulses: Normal pulses.  Pulmonary:     Effort: Pulmonary effort is normal.  Musculoskeletal:        General: Normal range of motion.     Cervical back: Normal range of motion.  Skin:    General:  Skin is warm and dry.  Neurological:     Mental Status: He is alert and oriented to person, place, and time.  Psychiatric:        Mood and Affect: Mood is anxious.        Behavior: Behavior normal. Behavior is cooperative.   Review of Systems  Gastrointestinal:  Positive for constipation.  Psychiatric/Behavioral:  Positive for depression and hallucinations. The patient is nervous/anxious.   All other systems reviewed and are negative. Blood pressure 122/80, pulse 83, temperature 98.1 F (36.7 C), temperature source Oral, resp. rate 17, height 5\' 10"  (1.778 m), weight 90.7 kg, SpO2 100 %. Body mass index is 28.7 kg/m.   Treatment Plan Summary: Daily contact with patient to assess and evaluate symptoms and progress in treatment and Medication management  01/28/2021 Update: Patient with diagnosis of schizoaffective disorder, bipolar type.  Currently making medication changes, decreasing Zyprexa to eventually discontinue. Giving Risperdal in preparation for getting Invega LAI if patient agrees, which he states he will think about.   Schizoaffective disorder/psychosis - Continue Clozapine tablet , decreased to 200 mg oral daily at bedtime - Continue benzatropine tablet 0.5 mg oral 2 times daily prophylaxis for EPS -Continue olanzapine Zydis, 5 mg oral daily at bedtime - Continue Risperdal, increased to 3 mg daily at bedtime  Mood stabilization/depression - Continue sertraline tablet 100 mg oral daily - Continue lithium carbonate CR tablet 600 mg oral daily at bedtime  Chronic constipation - Continue docusate sodium capsule 200 mg oral 2 times daily - Continue MiraLAX 17 g oral daily -Start Senokot tablet 8.6 mg oral daily as needed for constipation  Overactive bladder - Continue Ditropan XL 24-hour tablet 10 mg p.o. at bedtime  Supplementation - Continue Vitamin C tablet 500 mg oral daily  GERD - Continue Protonix EC tablet 40 mg daily  Anxiety/agitation - Continue hydroxyzine  tablet 50 mg oral every 6 hours as needed - Continue lorazepam tablet 2 mg oral every 6 hours as needed for anxiety or sedation  - Continue olanzapine tablet 10 mg oral every 6 hours as needed for agitation - Continue ziprasidone injection 20 mg IM every 8 hours as needed for agitation  Smoking cessation/nicotine replacement - Discontinued nicotine transdermal patch 14 mg daily - Continue nicotine gum 2 mg oral as needed  PRN, Other  --Continue Tylenol 650 mg po every 6 hrs prn pain --Continue MAALOX/MYLANTA 30 mL po every 4 hrs prn indigestion --Continue Milk of Magnesia 30 mL po daily prn constipation       01/30/2021, NP 01/28/2021, 4:19 PM

## 2021-01-28 NOTE — Group Note (Signed)
[facility] LCSW Group Therapy Note   Group Date: 01/28/2021 Start Time: 1315 End Time: 1400   Type of Therapy/Topic:  Group Therapy:  Emotion Regulation  Participation Level:  Minimal     Description of Group:    The purpose of this group is to assist patients in learning to regulate negative emotions and experience positive emotions. Patients will be guided to discuss ways in which they have been vulnerable to their negative emotions. These vulnerabilities will be juxtaposed with experiences of positive emotions or situations, and patients challenged to use positive emotions to combat negative ones. Special emphasis will be placed on coping with negative emotions in conflict situations, and patients will process healthy conflict resolution skills.  Therapeutic Goals: Patient will identify two positive emotions or experiences to reflect on in order to balance out negative emotions:  Patient will label two or more emotions that they find the most difficult to experience:  Patient will be able to demonstrate positive conflict resolution skills through discussion or role plays:   Summary of Patient Progress: Pt came into group towards the end of the activity. During his time in the room, he was appropriate and affect appeared bright as he danced and sung along with the music.   Therapeutic Modalities:   Cognitive Behavioral Therapy Feelings Identification Dialectical Behavioral Therapy   Glenis Smoker, LCSW

## 2021-01-29 DIAGNOSIS — F250 Schizoaffective disorder, bipolar type: Secondary | ICD-10-CM | POA: Diagnosis not present

## 2021-01-29 MED ORDER — RISPERIDONE 1 MG PO TABS
4.0000 mg | ORAL_TABLET | Freq: Every day | ORAL | Status: DC
  Administered 2021-01-29 – 2021-02-10 (×12): 4 mg via ORAL
  Filled 2021-01-29 (×12): qty 4

## 2021-01-29 MED ORDER — PALIPERIDONE PALMITATE ER 234 MG/1.5ML IM SUSY
234.0000 mg | PREFILLED_SYRINGE | INTRAMUSCULAR | Status: DC
  Administered 2021-01-30: 234 mg via INTRAMUSCULAR
  Filled 2021-01-29 (×2): qty 1.5

## 2021-01-29 MED ORDER — LITHIUM CARBONATE ER 300 MG PO TBCR
600.0000 mg | EXTENDED_RELEASE_TABLET | Freq: Two times a day (BID) | ORAL | Status: DC
  Administered 2021-01-29 – 2021-02-08 (×20): 600 mg via ORAL
  Filled 2021-01-29 (×20): qty 2

## 2021-01-29 MED ORDER — CLOZAPINE 100 MG PO TABS
100.0000 mg | ORAL_TABLET | Freq: Every day | ORAL | Status: DC
  Administered 2021-01-29 – 2021-02-13 (×15): 100 mg via ORAL
  Filled 2021-01-29 (×15): qty 1

## 2021-01-29 NOTE — Group Note (Signed)
[facility] LCSW Group Therapy Note   Group Date: 01/29/2021 Start Time: 9234 End Time: 1400   Type of Therapy/Topic:  Group Therapy:  Balance in Life  Participation Level:  None   Description of Group:    This group will address the concept of balance and how it feels and looks when one is unbalanced. Patients will be encouraged to process areas in their lives that are out of balance, and identify reasons for remaining unbalanced. Facilitators will guide patients utilizing problem- solving interventions to address and correct the stressor making their life unbalanced. Understanding and applying boundaries will be explored and addressed for obtaining  and maintaining a balanced life. Patients will be encouraged to explore ways to assertively make their unbalanced needs known to significant others in their lives, using other group members and facilitator for support and feedback.  Therapeutic Goals: Patient will identify two or more emotions or situations they have that consume much of in their lives. Patient will identify signs/triggers that life has become out of balance:  Patient will identify two ways to set boundaries in order to achieve balance in their lives:  Patient will demonstrate ability to communicate their needs through discussion and/or role plays  Summary of Patient Progress:    CSW met with patient outside. When prompted for discussion, pt did not engage in conversation beyond introductions, though pleasant.    Therapeutic Modalities:   Cognitive Behavioral Therapy Solution-Focused Therapy Assertiveness Training   [place] A [place], LCSWA

## 2021-01-29 NOTE — Plan of Care (Signed)
  Problem: Coping: Goal: Ability to identify and develop effective coping behavior will improve Outcome: Progressing   Problem: Self-Concept: Goal: Ability to identify factors that promote anxiety will improve Outcome: Progressing Goal: Level of anxiety will decrease Outcome: Progressing Goal: Ability to modify response to factors that promote anxiety will improve Outcome: Progressing   Problem: Education: Goal: Utilization of techniques to improve thought processes will improve Outcome: Progressing Goal: Knowledge of the prescribed therapeutic regimen will improve Outcome: Progressing   

## 2021-01-29 NOTE — Progress Notes (Signed)
Patient is pleasant and cooperative. He is interacting well with other peers on the unit hanging out in the dayroom listening to  music and writing his thoughts down. He is clear and logical in his thoughts and speech. No behavior issues to note. Will continue to monitor with Q15 minute safety rounds.   Cleo Butler-Nicholson, LPN

## 2021-01-29 NOTE — Progress Notes (Signed)
Pt has no complaints. He has been calm, cooperative, med compliant and social. Torrie Mayers RN

## 2021-01-29 NOTE — Plan of Care (Signed)
Pt rates depression 8/10, hopelessness 8/10 and anxiety 9/10. Pt was educated on care plan and verbalizes understanding. Torrie Mayers RN Problem: Education: Goal: Ability to state activities that reduce stress will improve Outcome: Progressing   Problem: Coping: Goal: Ability to identify and develop effective coping behavior will improve Outcome: Progressing   Problem: Self-Concept: Goal: Ability to identify factors that promote anxiety will improve Outcome: Progressing Goal: Level of anxiety will decrease Outcome: Progressing Goal: Ability to modify response to factors that promote anxiety will improve Outcome: Progressing   Problem: Education: Goal: Utilization of techniques to improve thought processes will improve Outcome: Progressing Goal: Knowledge of the prescribed therapeutic regimen will improve Outcome: Progressing   Problem: Activity: Goal: Interest or engagement in leisure activities will improve Outcome: Progressing Goal: Imbalance in normal sleep/wake cycle will improve Outcome: Progressing   Problem: Coping: Goal: Coping ability will improve Outcome: Progressing Goal: Will verbalize feelings Outcome: Progressing   Problem: Health Behavior/Discharge Planning: Goal: Ability to make decisions will improve Outcome: Progressing Goal: Compliance with therapeutic regimen will improve Outcome: Progressing   Problem: Role Relationship: Goal: Will demonstrate positive changes in social behaviors and relationships Outcome: Progressing   Problem: Safety: Goal: Ability to disclose and discuss suicidal ideas will improve Outcome: Progressing Goal: Ability to identify and utilize support systems that promote safety will improve Outcome: Progressing   Problem: Self-Concept: Goal: Will verbalize positive feelings about self Outcome: Progressing Goal: Level of anxiety will decrease Outcome: Progressing   Problem: Education: Goal: Knowledge of [place] [facility]  Education information/materials will improve Outcome: Progressing Goal: Emotional status will improve Outcome: Progressing Goal: Mental status will improve Outcome: Progressing Goal: Verbalization of understanding the information provided will improve Outcome: Progressing   Problem: Activity: Goal: Interest or engagement in activities will improve Outcome: Progressing Goal: Sleeping patterns will improve Outcome: Progressing   Problem: Coping: Goal: Ability to verbalize frustrations and anger appropriately will improve Outcome: Progressing Goal: Ability to demonstrate self-control will improve Outcome: Progressing   Problem: Health Behavior/Discharge Planning: Goal: Identification of resources available to assist in meeting health care needs will improve Outcome: Progressing Goal: Compliance with treatment plan for underlying cause of condition will improve Outcome: Progressing   Problem: Physical Regulation: Goal: Ability to maintain clinical measurements within normal limits will improve Outcome: Progressing   Problem: Safety: Goal: Periods of time without injury will increase Outcome: Progressing

## 2021-01-29 NOTE — Progress Notes (Signed)
Recreation Therapy Notes  Date: 01/29/2021  Time: 10:30 am   Location: Craft room   Behavioral response: Appropriate  Intervention Topic: Time Management   Discussion/Intervention:  Group content today was focused on time management. The group defined time management and identified healthy ways to manage time. Individuals expressed how much of the 24 hours they use in a day. Patients expressed how much time they use just for themselves personally. The group expressed how they have managed their time in the past. Individuals participated in the intervention "Managing Life" where they had a chance to see how much of the 24 hours they use and where it goes. Clinical Observations/Feedback: Patient came to group late due to unknown reasons. He explained that he spends 30 minutes a day participating in self care. Individual was social with peers and staff while participating in the intervention.    LRT/CTRS            01/29/2021 12:03 PM

## 2021-01-29 NOTE — Progress Notes (Signed)
[facility] MD Progress Note  01/29/2021 2:35 PM Lawrence Morgan  MRN:  [id number]  [name] is a 40 year old male who presented to the ED via [facility] from [facility] where he was threatening to "cut people's heads off" and throwing chairs. At some point in the ED visit, It was discovered that he had swallowed some batteries.  Patient has a history of schizoaffective disorder with several prior admissions, as well as swallowing foreign bodies when he gets agitated.  Patient has a guardian, Lawrence Morgan [phone]) and lives in group home, [facility] in [place].   CC: "The social worker put a referral in for [facility]." Subjective: Patient was seen and interviewed today.  He continues to perseverate on going to the [facility].  His complaints are the same, of visual hallucinations of shadows and things coming out of the wall.  He endorses passive suicidal ideation without a plan.  Contracts for safety on the unit.  Denies homicidal ideations today.  Denies auditory hallucinations.  Patient states that he slept well with trazodone last night.  Endorses adequate appetite.  Discussion with patient regarding accepting the LAI and need for Clozaril to maintain mental health stability.  Patient is refusing both of these medications at this time.  Discussed with patient that refusing medication does not mean that he will get to go to another hospital from here.  Unsure if patient understands.  Risperdal has been increased to 4 mg at night.  Principal Problem: Schizoaffective disorder, bipolar type (HCC) Diagnosis: Principal Problem:   Schizoaffective disorder, bipolar type (HCC) Active Problems:   Chronic constipation   GERD (gastroesophageal reflux disease)   Ingestion of foreign body  Total Time spent with patient: 15 minutes  Past Psychiatric History: See H&P  Past Medical History: History reviewed. No pertinent past medical history.  Past Surgical History:  Procedure Laterality Date    ESOPHAGOGASTRODUODENOSCOPY (EGD) WITH PROPOFOL N/A 01/20/2021   Procedure: ESOPHAGOGASTRODUODENOSCOPY (EGD) WITH PROPOFOL;  Surgeon: Midge Minium, MD;  Location: [facility] ENDOSCOPY;  Service: Endoscopy;  Laterality: N/A;   Family History: History reviewed. No pertinent family history. Family Psychiatric  History: See H&P Social History:  Social History   Substance and Sexual Activity  Alcohol Use Never     Social History   Substance and Sexual Activity  Drug Use Never    Social History   Socioeconomic History   Marital status: Single    Spouse name: Not on file   Number of children: Not on file   Years of education: Not on file   Highest education level: Not on file  Occupational History   Not on file  Tobacco Use   Smoking status: Every Day    Packs/day: 1.50    Types: Cigarettes   Smokeless tobacco: Never  Vaping Use   Vaping Use: Some days   Substances: Nicotine  Substance and Sexual Activity   Alcohol use: Never   Drug use: Never   Sexual activity: Not Currently  Other Topics Concern   Not on file  Social History Narrative   Not on file   Social Determinants of Health   Financial Resource Strain: Not on file  Food Insecurity: Not on file  Transportation Needs: Not on file  Physical Activity: Not on file  Stress: Not on file  Social Connections: Not on file   Additional Social History:   Sleep: Good  Appetite:  Good  Current Medications: Current Facility-Administered Medications  Medication Dose Route Frequency Provider Last Rate Last Admin  acetaminophen (TYLENOL) tablet 650 mg  650 mg Oral Q6H PRN [name], [name], MD   650 mg at 01/29/21 0754   alum & mag hydroxide-simeth (MAALOX/MYLANTA) 200-200-20 MG/5ML suspension 30 mL  30 mL Oral Q4H PRN [name], [name], MD       ascorbic acid (VITAMIN C) tablet 500 mg  500 mg Oral Daily [name], Megan M, MD   500 mg at 01/29/21 0756   benztropine (COGENTIN) tablet 0.5 mg  0.5 mg Oral BID [name], [name], MD   0.5  mg at 01/29/21 0753   cloZAPine (CLOZARIL) tablet 100 mg  100 mg Oral QHS [name], Megan M, MD       docusate sodium (COLACE) capsule 200 mg  200 mg Oral BID [name], [name], MD   200 mg at 01/29/21 0754   hydrOXYzine (ATARAX/VISTARIL) tablet 50 mg  50 mg Oral Q6H PRN [name], Jackquline DenmarkJohn T, MD   50 mg at 01/29/21 0753   lithium carbonate (LITHOBID) CR tablet 600 mg  600 mg Oral Q12H [name], Megan M, MD   600 mg at 01/29/21 16100753   magnesium hydroxide (MILK OF MAGNESIA) suspension 30 mL  30 mL Oral Daily PRN [name], Jackquline DenmarkJohn T, MD       nicotine polacrilex (NICORETTE) gum 2 mg  2 mg Oral PRN Vanetta MuldersBarthold,  F, NP       OLANZapine (ZYPREXA) tablet 10 mg  10 mg Oral Q6H PRN [name], Megan M, MD       OLANZapine zydis (ZYPREXA) disintegrating tablet 5 mg  5 mg Oral QHS Gabriel CirriBarthold,  F, NP   5 mg at 01/28/21 2053   oxybutynin (DITROPAN-XL) 24 hr tablet 10 mg  10 mg Oral QHS [name], Megan M, MD   10 mg at 01/28/21 2053   paliperidone (INVEGA SUSTENNA) injection 234 mg  234 mg Intramuscular Q28 days [name], Megan M, MD       pantoprazole (PROTONIX) EC tablet 40 mg  40 mg Oral Daily [name], Jackquline DenmarkJohn T, MD   40 mg at 01/29/21 0753   polyethylene glycol (MIRALAX / GLYCOLAX) packet 17 g  17 g Oral Daily [name], [name], MD   17 g at 01/28/21 0746   risperiDONE (RISPERDAL) tablet 4 mg  4 mg Oral QHS [name], Megan M, MD       senna [facility](SENOKOT) tablet 8.6 mg  1 tablet Oral Daily PRN Gabriel CirriBarthold,  F, NP       sertraline (ZOLOFT) tablet 100 mg  100 mg Oral Daily [name], [name], MD   100 mg at 01/29/21 0755   traZODone (DESYREL) tablet 50 mg  50 mg Oral QHS PRN Gillermo Murdochhompson, Jacqueline, NP   50 mg at 01/28/21 2128   ziprasidone (GEODON) injection 20 mg  20 mg Intramuscular Q8H PRN [name], Megan M, MD        Lab Results:  No results found for this or any previous visit (from the past 48 hour(s)).   Blood Alcohol level:  Lab Results  Component Value Date   ETH <10 01/20/2021    Metabolic Disorder Labs: Lab  Results  Component Value Date   HGBA1C 5.7 (H) 01/22/2021   MPG 116.89 01/22/2021   No results found for: PROLACTIN Lab Results  Component Value Date   CHOL 176 01/22/2021   TRIG 179 (H) 01/22/2021   HDL 47 01/22/2021   CHOLHDL 3.7 01/22/2021   VLDL 36 01/22/2021   LDLCALC 93 01/22/2021    Physical Findings: AIMS:  , ,  ,  ,  CIWA:    COWS:     Musculoskeletal: Strength & Muscle Tone: within normal limits Gait & Station: normal Patient leans: N/A  Psychiatric Specialty Exam:  Presentation  General Appearance: Appropriate for Environment  Eye Contact:Good  Speech:Clear and Coherent; Normal Rate  Speech Volume:Normal  Handedness:Right   Mood and Affect  Mood:Euthymic  Affect:Congruent   Thought Process  Thought Processes:Coherent  Descriptions of Associations:Loose  Orientation:Full (Time, Place and Person)  Thought Content:Rumination  History of Schizophrenia/Schizoaffective disorder:Yes  Duration of Psychotic Symptoms:Greater than six months  Hallucinations:Hallucinations: Visual Description of Visual Hallucinations: "shadow characters coming out of wall" Ideas of Reference:Paranoia  Suicidal Thoughts:Suicidal Thoughts: Yes, Passive (Contracts for safety on the unit) Homicidal Thoughts:Homicidal Thoughts: No  Sensorium  Memory:Immediate Good  Judgment:Impaired  Insight:Poor   Executive Functions  Concentration:Fair  Attention Span:Fair  Recall:Fair  Fund of Knowledge:Fair  Language:Fair   Psychomotor Activity  Psychomotor Activity: No data recorded  Assets  Assets:Desire for Improvement; Financial Resources/Insurance; Housing; Resilience; Physical Health; Social Support   Sleep  Sleep: Sleep: Good Number of Hours of Sleep: 8   Physical Exam: Physical Exam Vitals and nursing note reviewed.  HENT:     Head: Normocephalic.     Nose: No congestion or rhinorrhea.  Eyes:     General:        Right eye: No  discharge.        Left eye: No discharge.  Cardiovascular:     Rate and Rhythm: Normal rate.     Pulses: Normal pulses.  Pulmonary:     Effort: Pulmonary effort is normal.  Musculoskeletal:        General: Normal range of motion.     Cervical back: Normal range of motion.  Skin:    General: Skin is warm and dry.  Neurological:     Mental Status: He is alert and oriented to person, place, and time.  Psychiatric:        Mood and Affect: Mood is anxious.        Behavior: Behavior normal. Behavior is cooperative.   Review of Systems  Gastrointestinal:  Positive for constipation (chronic, improving).  Psychiatric/Behavioral:  Positive for depression and hallucinations. The patient is nervous/anxious.   All other systems reviewed and are negative. Blood pressure 110/73, pulse 84, temperature 98.5 F (36.9 C), temperature source Oral, resp. rate 17, height 5\' 10"  (1.778 m), weight 90.7 kg, SpO2 100 %. Body mass index is 28.7 kg/m.   Treatment Plan Summary: Daily contact with patient to assess and evaluate symptoms and progress in treatment and Medication management  01/29/2021 Update: Patient with diagnosis of schizoaffective disorder, bipolar type.  Currently making medication changes, decreasing Zyprexa to eventually discontinue. Giving Risperdal in preparation for getting Invega LAI if patient agrees, which he states he will think about. Still refusing LAI, despite repeated discussion. Repeateds discussion that patient cannot stay in the hospital to be transferred to [facility].   Schizoaffective disorder/psychosis - Continue Clozapine tablet , decreased to 100 mg oral daily at bedtime - Continue benzatropine tablet 0.5 mg oral 2 times daily prophylaxis for EPS - Continue olanzapine Zydis, 5 mg oral daily at bedtime - Continue Risperdal, increased to 4 mg daily at bedtime - Give [facility] Sustenna injection 234 mg IM daily (patient currently refusing)  Mood stabilization/depression - Continue  sertraline tablet 100 mg oral daily - Continue lithium carbonate CR tablet 600 mg oral daily at bedtime  Chronic constipation - Continue docusate sodium capsule 200 mg oral 2  times daily - Continue MiraLAX 17 g oral daily -Start Senokot tablet 8.6 mg oral daily as needed for constipation  Overactive bladder - Continue Ditropan XL 24-hour tablet 10 mg p.o. at bedtime  Supplementation - Continue Vitamin C tablet 500 mg oral daily  GERD - Continue Protonix EC tablet 40 mg daily  Anxiety/agitation - Continue hydroxyzine tablet 50 mg oral every 6 hours as needed - Continue olanzapine tablet 10 mg oral every 6 hours as needed for agitation - Continue ziprasidone injection 20 mg IM every 8 hours as needed for agitation  Smoking cessation/nicotine replacement -  Continue nicotine gum 2 mg oral as needed  Insomnia -Continue trazodone 50 mg tab oral at bedtime  PRN, Other  --Continue Tylenol 650 mg po every 6 hrs prn pain --Continue MAALOX/MYLANTA 30 mL po every 4 hrs prn indigestion --Continue Milk of Magnesia 30 mL po daily prn constipation       [name], NP 01/29/2021, 2:35 PM

## 2021-01-29 NOTE — Progress Notes (Signed)
Patient calm and compliant during assessment denying SI/HI/AVH. Patient endorses anxiety. Pt observed interacting appropriately with staff and peers on the unit. Pt compliant with medication administration per MD orders except for the Clozaril.  When this Clinical research associate gave patient information on his medication, he refused it. Pt given education, support, and encouragement to be active in his treatment plan. Pt being monitored Q 15 minutes for safety per unit protocol. Pt remains safe on the unit.

## 2021-01-30 DIAGNOSIS — F250 Schizoaffective disorder, bipolar type: Secondary | ICD-10-CM | POA: Diagnosis not present

## 2021-01-30 MED ORDER — IBUPROFEN 600 MG PO TABS
600.0000 mg | ORAL_TABLET | Freq: Four times a day (QID) | ORAL | Status: DC | PRN
  Administered 2021-02-13 – 2021-02-14 (×2): 600 mg via ORAL
  Filled 2021-01-30 (×2): qty 1

## 2021-01-30 MED ORDER — LIDOCAINE 5 % EX PTCH
1.0000 | MEDICATED_PATCH | CUTANEOUS | Status: DC
  Administered 2021-01-30 – 2021-02-17 (×9): 1 via TRANSDERMAL
  Filled 2021-01-30 (×19): qty 1

## 2021-01-30 NOTE — Progress Notes (Signed)
Recreation Therapy Notes   Date: 01/30/2021  Time: 10:45am   Location: Craft room  Behavioral response: N/A   Intervention Topic: Self-care   Discussion/Intervention: Patient did not attend group.   Clinical Observations/Feedback:  Patient did not attend group.     LRT/CTRS           01/30/2021 12:50 PM

## 2021-01-30 NOTE — Progress Notes (Addendum)
[facility] MD Progress Note  01/30/2021 10:29 AM Lawrence Morgan  MRN:  [id number]    CC: "I'm still seeing gremlins and blood all over the walls"  Subjective: 40 year old male who presented to the ED via BPD from [facility] where he was threatening to "cut people's heads off" and throwing chairs. No acute events overnight, medication compliant, attending to ADLs.  Patient was seen and interviewed today.  He notes that he has a headache and backache today, and requests additional pain medications. Ibuprofen and lidocaine patch ordered. He continues to have auditory hallucinations of voices taunting him, and visual hallucinations of gremlins coming out of the wall to attack him and blood dripping from the walls. He denies any suicidal ideations, continues to have some homicidal ideations towards group home members. He agreed to take [place] 234 mg IM injection today. Will discontinue Zyprexa.   Principal Problem: Schizoaffective disorder, bipolar type (HCC) Diagnosis: Principal Problem:   Schizoaffective disorder, bipolar type (HCC) Active Problems:   Chronic constipation   GERD (gastroesophageal reflux disease)   Ingestion of foreign body  Total Time spent with patient: 15 minutes  Past Psychiatric History: See H&P  Past Medical History: History reviewed. No pertinent past medical history.  Past Surgical History:  Procedure Laterality Date   ESOPHAGOGASTRODUODENOSCOPY (EGD) WITH PROPOFOL N/A 01/20/2021   Procedure: ESOPHAGOGASTRODUODENOSCOPY (EGD) WITH PROPOFOL;  Surgeon: Midge Minium, MD;  Location: [facility] ENDOSCOPY;  Service: Endoscopy;  Laterality: N/A;   Family History: History reviewed. No pertinent family history. Family Psychiatric  History: See H&P Social History:  Social History   Substance and Sexual Activity  Alcohol Use Never     Social History   Substance and Sexual Activity  Drug Use Never    Social History   Socioeconomic History   Marital status: Single    Spouse name:  Not on file   Number of children: Not on file   Years of education: Not on file   Highest education level: Not on file  Occupational History   Not on file  Tobacco Use   Smoking status: Every Day    Packs/day: 1.50    Types: Cigarettes   Smokeless tobacco: Never  Vaping Use   Vaping Use: Some days   Substances: Nicotine  Substance and Sexual Activity   Alcohol use: Never   Drug use: Never   Sexual activity: Not Currently  Other Topics Concern   Not on file  Social History Narrative   Not on file   Social Determinants of Health   Financial Resource Strain: Not on file  Food Insecurity: Not on file  Transportation Needs: Not on file  Physical Activity: Not on file  Stress: Not on file  Social Connections: Not on file   Additional Social History:   Sleep: Good  Appetite:  Good  Current Medications: Current Facility-Administered Medications  Medication Dose Route Frequency Provider Last Rate Last Admin   acetaminophen (TYLENOL) tablet 650 mg  650 mg Oral Q6H PRN [name], [name], MD   650 mg at 01/29/21 0754   alum & mag hydroxide-simeth (MAALOX/MYLANTA) 200-200-20 MG/5ML suspension 30 mL  30 mL Oral Q4H PRN [name], [name], MD       ascorbic acid (VITAMIN C) tablet 500 mg  500 mg Oral Daily Les Pou M, MD   500 mg at 01/30/21 0825   benztropine (COGENTIN) tablet 0.5 mg  0.5 mg Oral BID [name], [name], MD   0.5 mg at 01/30/21 0823   cloZAPine (CLOZARIL)  tablet 100 mg  100 mg Oral QHS Jesse Sans, MD   100 mg at 01/29/21 2129   docusate sodium (COLACE) capsule 200 mg  200 mg Oral BID [name], [name], MD   200 mg at 01/30/21 5643   hydrOXYzine (ATARAX/VISTARIL) tablet 50 mg  50 mg Oral Q6H PRN [name], [name], MD   50 mg at 01/29/21 0753   ibuprofen (ADVIL) tablet 600 mg  600 mg Oral Q6H PRN Jesse Sans, MD       lidocaine (LIDODERM) 5 % 1 patch  1 patch Transdermal Q24H Jesse Sans, MD       lithium carbonate (LITHOBID) CR tablet 600 mg  600 mg Oral  Q12H Jesse Sans, MD   600 mg at 01/30/21 3295   magnesium hydroxide (MILK OF MAGNESIA) suspension 30 mL  30 mL Oral Daily PRN [name], [name], MD       nicotine polacrilex (NICORETTE) gum 2 mg  2 mg Oral PRN [name], NP       OLANZapine (ZYPREXA) tablet 10 mg  10 mg Oral Q6H PRN Jesse Sans, MD       oxybutynin (DITROPAN-XL) 24 hr tablet 10 mg  10 mg Oral QHS Jesse Sans, MD   10 mg at 01/29/21 2133   paliperidone (INVEGA SUSTENNA) injection 234 mg  234 mg Intramuscular Q28 days Jesse Sans, MD   234 mg at 01/30/21 1015   pantoprazole (PROTONIX) EC tablet 40 mg  40 mg Oral Daily [name], [name], MD   40 mg at 01/30/21 0824   polyethylene glycol (MIRALAX / GLYCOLAX) packet 17 g  17 g Oral Daily [name], [name], MD   17 g at 01/30/21 0829   risperiDONE (RISPERDAL) tablet 4 mg  4 mg Oral QHS Jesse Sans, MD   4 mg at 01/29/21 2129   senna (SENOKOT) tablet 8.6 mg  1 tablet Oral Daily PRN [name], NP       sertraline (ZOLOFT) tablet 100 mg  100 mg Oral Daily [name], [name], MD   100 mg at 01/30/21 0824   traZODone (DESYREL) tablet 50 mg  50 mg Oral QHS PRN [name], NP   50 mg at 01/28/21 2128   ziprasidone (GEODON) injection 20 mg  20 mg Intramuscular Q8H PRN Jesse Sans, MD        Lab Results:  No results found for this or any previous visit (from the past 48 hour(s)).   Blood Alcohol level:  Lab Results  Component Value Date   ETH <10 01/20/2021    Metabolic Disorder Labs: Lab Results  Component Value Date   HGBA1C 5.7 (H) 01/22/2021   MPG 116.89 01/22/2021   No results found for: PROLACTIN Lab Results  Component Value Date   CHOL 176 01/22/2021   TRIG 179 (H) 01/22/2021   HDL 47 01/22/2021   CHOLHDL 3.7 01/22/2021   VLDL 36 01/22/2021   LDLCALC 93 01/22/2021    Physical Findings: AIMS:  , ,  ,  ,    CIWA:    COWS:     Musculoskeletal: Strength & Muscle Tone: within normal limits Gait & Station:  normal Patient leans: N/A  Psychiatric Specialty Exam:  Presentation  General Appearance: Appropriate for Environment  Eye Contact:Good  Speech:Clear and Coherent; Normal Rate  Speech Volume:Normal  Handedness:Right   Mood and Affect  Mood:Euthymic  Affect:Congruent   Thought Process  Thought Processes:Coherent  Descriptions of Associations:Loose  Orientation:Full (Time, Place and Person)  Thought Content:Rumination  History of Schizophrenia/Schizoaffective disorder:Yes  Duration of Psychotic Symptoms:Greater than six months  Hallucinations:Hallucinations: Visual Description of Visual Hallucinations: "shadow characters coming out of wall" Ideas of Reference:Paranoia  Suicidal Thoughts:Suicidal Thoughts: Yes, Passive (Contracts for safety on the unit) Homicidal Thoughts:Homicidal Thoughts: No  Sensorium  Memory:Immediate Good  Judgment:Impaired  Insight:Poor   Executive Functions  Concentration:Fair  Attention Span:Fair  Recall:Fair  Fund of Knowledge:Fair  Language:Fair   Psychomotor Activity  Psychomotor Activity: No data recorded  Assets  Assets:Desire for Improvement; Financial Resources/Insurance; Housing; Resilience; Physical Health; Social Support   Sleep  Sleep: Sleep: Good Number of Hours of Sleep: 8   Physical Exam: Physical Exam Vitals and nursing note reviewed.  HENT:     Head: Normocephalic.     Nose: No congestion or rhinorrhea.  Eyes:     General:        Right eye: No discharge.        Left eye: No discharge.  Cardiovascular:     Rate and Rhythm: Normal rate.     Pulses: Normal pulses.  Pulmonary:     Effort: Pulmonary effort is normal.  Musculoskeletal:        General: Normal range of motion.     Cervical back: Normal range of motion.  Skin:    General: Skin is warm and dry.  Neurological:     Mental Status: He is alert and oriented to person, place, and time.  Psychiatric:        Mood and Affect: Mood  is anxious.        Behavior: Behavior normal. Behavior is cooperative.   Review of Systems  Gastrointestinal:  Positive for constipation (chronic, improving).  Psychiatric/Behavioral:  Positive for depression and hallucinations. The patient is nervous/anxious.   All other systems reviewed and are negative. Blood pressure 110/73, pulse 84, temperature 98.5 F (36.9 C), temperature source Oral, resp. rate 17, height 5\' 10"  (1.778 m), weight 90.7 kg, SpO2 100 %. Body mass index is 28.7 kg/m.   Treatment Plan Summary: Daily contact with patient to assess and evaluate symptoms and progress in treatment and Medication management  01/30/2021 Update: Patient with diagnosis of schizoaffective disorder, bipolar type.  Currently making medication changes, he did agree to 04/01/2021 injection today.   Schizoaffective disorde, bipolar type - Continue Clozapine 100 mg oral daily at bedtime - Continue benzatropine tablet 0.5 mg oral 2 times daily prophylaxis for EPS - Discontinue olanzapine Zydis, 5 mg oral daily at bedtime - Continue Risperdal 4 mg daily at bedtime - [place] injection 234 mg IM given 01/30/21 - Continue sertraline tablet 100 mg oral daily - Continue lithium carbonate CR tablet 600 mg BID (increased from 600 mg nightly with level 0.20)  Chronic constipation - Continue docusate sodium capsule 200 mg oral 2 times daily - Continue MiraLAX 17 g oral daily - Continue Senokot tablet 8.6 mg oral daily as needed for constipation  Overactive bladder - Continue Ditropan XL 24-hour tablet 10 mg p.o. at bedtime  Supplementation - Continue Vitamin C tablet 500 mg oral daily  GERD - Continue Protonix EC tablet 40 mg daily  Anxiety/agitation - Continue hydroxyzine tablet 50 mg oral every 6 hours as needed - Continue olanzapine tablet 10 mg oral every 6 hours as needed for agitation - Continue ziprasidone injection 20 mg IM every 8 hours as needed for agitation  Smoking  cessation/nicotine replacement -  Continue nicotine gum 2  mg oral as needed  Insomnia -Continue trazodone 50 mg tab oral at bedtime  PRN, Other  --Continue Tylenol 650 mg po every 6 hrs prn pain --Continue MAALOX/MYLANTA 30 mL po every 4 hrs prn indigestion --Continue Milk of Magnesia 30 mL po daily prn constipation       Jesse SansMegan M , MD 01/30/2021, 10:29 AM

## 2021-01-30 NOTE — Plan of Care (Signed)
D: Patient resting in bed this am. Informed RN he did not sleep well. Denies SI/HI. Endorses AH/VH. See psych assessment for details. Reports pain at present. See pain assessment for details. Takes po medication in room.   A: Labs and vital signs monitored. Patient encouraged to ask questions and express concerns. Denies any at present. Emotional support provided  R: No s/s of distress at present. Continue q 15 minute checks for safety.  Problem: Education: Goal: Ability to state activities that reduce stress will improve Outcome: Progressing   Problem: Coping: Goal: Ability to identify and develop effective coping behavior will improve Outcome: Progressing   Problem: Self-Concept: Goal: Ability to identify factors that promote anxiety will improve Outcome: Progressing Goal: Level of anxiety will decrease Outcome: Progressing Goal: Ability to modify response to factors that promote anxiety will improve Outcome: Progressing   Problem: Education: Goal: Utilization of techniques to improve thought processes will improve Outcome: Progressing Goal: Knowledge of the prescribed therapeutic regimen will improve Outcome: Progressing   Problem: Activity: Goal: Interest or engagement in leisure activities will improve Outcome: Progressing Goal: Imbalance in normal sleep/wake cycle will improve Outcome: Progressing   Problem: Coping: Goal: Coping ability will improve Outcome: Progressing Goal: Will verbalize feelings Outcome: Progressing   Problem: Health Behavior/Discharge Planning: Goal: Ability to make decisions will improve Outcome: Progressing Goal: Compliance with therapeutic regimen will improve Outcome: Progressing   Problem: Role Relationship: Goal: Will demonstrate positive changes in social behaviors and relationships Outcome: Progressing   Problem: Safety: Goal: Ability to disclose and discuss suicidal ideas will improve Outcome: Progressing Goal: Ability to  identify and utilize support systems that promote safety will improve Outcome: Progressing   Problem: Self-Concept: Goal: Will verbalize positive feelings about self Outcome: Progressing Goal: Level of anxiety will decrease Outcome: Progressing   Problem: Education: Goal: Knowledge of [place] [facility] Education information/materials will improve Outcome: Progressing Goal: Emotional status will improve Outcome: Progressing Goal: Mental status will improve Outcome: Progressing Goal: Verbalization of understanding the information provided will improve Outcome: Progressing   Problem: Activity: Goal: Interest or engagement in activities will improve Outcome: Progressing Goal: Sleeping patterns will improve Outcome: Progressing   Problem: Coping: Goal: Ability to verbalize frustrations and anger appropriately will improve Outcome: Progressing Goal: Ability to demonstrate self-control will improve Outcome: Progressing   Problem: Health Behavior/Discharge Planning: Goal: Identification of resources available to assist in meeting health care needs will improve Outcome: Progressing Goal: Compliance with treatment plan for underlying cause of condition will improve Outcome: Progressing   Problem: Physical Regulation: Goal: Ability to maintain clinical measurements within normal limits will improve Outcome: Progressing   Problem: Safety: Goal: Periods of time without injury will increase Outcome: Progressing

## 2021-01-30 NOTE — Group Note (Addendum)
[facility] LCSW Group Therapy Note   Group Date: 01/30/2021 Start Time: 1315 End Time: 1400  Type of Therapy and Topic:  Group Therapy:  Feelings around Relapse and Recovery  Participation Level:  Active    Description of Group:    Patients in this group will discuss emotions they experience before and after a relapse. They will process how experiencing these feelings, or avoidance of experiencing them, relates to having a relapse. Facilitator will guide patients to explore emotions they have related to recovery. Patients will be encouraged to process which emotions are more powerful. They will be guided to discuss the emotional reaction significant others in their lives may have to patients' relapse or recovery. Patients will be assisted in exploring ways to respond to the emotions of others without this contributing to a relapse.  Therapeutic Goals: Patient will identify two or more emotions that lead to relapse for them:  Patient will identify two emotions that result when they relapse:  Patient will identify two emotions related to recovery:  Patient will demonstrate ability to communicate their needs through discussion and/or role plays.   Summary of Patient Progress:  Patient was present for most of the group session. Patient left to get his medications but returned and participated. Patient was an active listener and participated in the topic of discussion, provided helpful advice to others, and added nuance to topic of conversation. Patient stated that recovery is a "a fight emotionally, physically and spiritually" and is extremely difficult. The group discussed coping skills to various emotions in recovery, which patient stated that music was a significant help for him.     Therapeutic Modalities:   Cognitive Behavioral Therapy Solution-Focused Therapy Assertiveness Training Relapse Prevention Therapy    A [place], LCSWA

## 2021-01-30 NOTE — [facility] Counselor (Signed)
CSW met with pt, per request. He inquired about [facility] referral. CSW explained that team was still waiting to hear back from his guardian. Pt voiced understanding and then inquired about talking to Ronnie Warren regarding his clothes. CSW acknowledged that he had not spoken with Warren but had spoken with Ronisha Warren last and forgotten to ask about clothes. Pt asked if CSW could call them and tell them to bring his clothes. CSW explained that they could be asked but that did not guarantee that they would bring his clothes. He voiced understanding. No other concerns expressed. Contact ended without incident.   Pt later asked about getting the numbers for Ronnie and/or Ronisha Warren. CSW explained that this was a question that he would have to ask them first along with his guardian. He agreed. No other concerns expressed. Contact ended without incident.   CSW spoke with Ronisha Warren (group home coordinator at [phone]) and inquired about someone bringing pt's clothes here. She inquired how much longer pt would be here. CSW explained that discharge date had not been set at the moment but that pt was perseverative about his clothes. She stated she just asked because she was just trying to figure out if it made sense. CSW stated that pt was only allowed to have some many clothes here. Warren stated that she was going to talk to the guardian and get back with CSW. She stated that if they did it would only be a couple of outfits. CSW agreed. No other concerns expressed. Contact ended without incident.    R. , MSW, LCSW, LCAS 01/30/2021 2:02 PM  

## 2021-01-31 DIAGNOSIS — Z5181 Encounter for therapeutic drug level monitoring: Secondary | ICD-10-CM | POA: Diagnosis not present

## 2021-01-31 DIAGNOSIS — F250 Schizoaffective disorder, bipolar type: Secondary | ICD-10-CM | POA: Diagnosis not present

## 2021-01-31 NOTE — Progress Notes (Signed)
Pt has been social and went to groups. Pt states that he is still SI and had a thought that he got a gun here and shot himself in the head.Pt verbally contracted for safety. Pt denies AVH now. Torrie Mayers RN

## 2021-01-31 NOTE — Progress Notes (Signed)
[facility]  01/31/2021 1:04 PM Lawrence Morgan  MRN:  [id number]    CC: "I see images"  Subjective: 40 year old male who presented to the ED via [facility] from [facility] where he was threatening to "cut people's heads off" and throwing chairs.  Pr seen for follow up, chart reviewed. Nurse reports- pt reports passive SI, but calm and cooperative. No acute events overnight, medication compliant, attending to ADLs.   Her was lying in his bed,  He continues to have auditory hallucinations of voices taunting him, and visual hallucinations of " images" coming out of the wall .  He denies any suicidal ideations, continues to have some homicidal ideations towards group home members. He agreed to take TanzaniaInvega Sustenna 234 mg IM injection yesterday, tolerating well.    Principal Problem: Schizoaffective disorder, bipolar type (HCC) Diagnosis: Principal Problem:   Schizoaffective disorder, bipolar type (HCC) Active Problems:   Chronic constipation   GERD (gastroesophageal reflux disease)   Ingestion of foreign body  Total Time spent with patient: 30 min  Past Psychiatric History: See H&P  Past Medical History: History reviewed. No pertinent past medical history.  Past Surgical History:  Procedure Laterality Date   ESOPHAGOGASTRODUODENOSCOPY (EGD) WITH PROPOFOL N/A 01/20/2021   Procedure: ESOPHAGOGASTRODUODENOSCOPY (EGD) WITH PROPOFOL;  Surgeon: [name], Darren, MD;  Location: [facility] ENDOSCOPY;  Service: Endoscopy;  Laterality: N/A;   Family History: History reviewed. No pertinent family history. Family Psychiatric  History: See H&P Social History:  Social History   Substance and Sexual Activity  Alcohol Use Never     Social History   Substance and Sexual Activity  Drug Use Never    Social History   Socioeconomic History   Marital status: Single    Spouse name: Not on file   Number of children: Not on file   Years of education: Not on file   Highest education level: Not on file   Occupational History   Not on file  Tobacco Use   Smoking status: Every Day    Packs/day: 1.50    Types: Cigarettes   Smokeless tobacco: Never  Vaping Use   Vaping Use: Some days   Substances: Nicotine  Substance and Sexual Activity   Alcohol use: Never   Drug use: Never   Sexual activity: Not Currently  Other Topics Concern   Not on file  Social History Narrative   Not on file   Social Determinants of Health   Financial Resource Strain: Not on file  Food Insecurity: Not on file  Transportation Needs: Not on file  Physical Activity: Not on file  Stress: Not on file  Social Connections: Not on file   Additional Social History:   Sleep: Good  Appetite:  Good  Current Medications: Current Facility-Administered Medications  Medication Dose Route Frequency Provider Last Rate Last Admin   acetaminophen (TYLENOL) tablet 650 mg  650 mg Oral Q6H PRN [name], [name], MD   650 mg at 01/29/21 0754   alum & mag hydroxide-simeth (MAALOX/MYLANTA) 200-200-20 MG/5ML suspension 30 mL  30 mL Oral Q4H PRN [name], [name], MD       ascorbic acid (VITAMIN C) tablet 500 mg  500 mg Oral Daily [name], Megan M, MD   500 mg at 01/31/21 [id number]   benztropine (COGENTIN) tablet 0.5 mg  0.5 mg Oral BID [name], [name], MD   0.5 mg at 01/31/21 0810   cloZAPine (CLOZARIL) tablet 100 mg  100 mg Oral QHS [name], Megan M, MD  100 mg at 01/30/21 2119   docusate sodium (COLACE) capsule 200 mg  200 mg Oral BID [name], [name], MD   200 mg at 01/31/21 0810   hydrOXYzine (ATARAX/VISTARIL) tablet 50 mg  50 mg Oral Q6H PRN [name], [name], MD   50 mg at 01/29/21 0753   ibuprofen (ADVIL) tablet 600 mg  600 mg Oral Q6H PRN Jesse Sans, MD       lidocaine (LIDODERM) 5 % 1 patch  1 patch Transdermal Q24H Jesse Sans, MD   1 patch at 01/30/21 1335   lithium carbonate (LITHOBID) CR tablet 600 mg  600 mg Oral Q12H Jesse Sans, MD   600 mg at 01/31/21 0131   magnesium hydroxide (MILK OF MAGNESIA)  suspension 30 mL  30 mL Oral Daily PRN [name], [name], MD       nicotine polacrilex (NICORETTE) gum 2 mg  2 mg Oral PRN [name], NP       OLANZapine (ZYPREXA) tablet 10 mg  10 mg Oral Q6H PRN Jesse Sans, MD       oxybutynin (DITROPAN-XL) 24 hr tablet 10 mg  10 mg Oral QHS Jesse Sans, MD   10 mg at 01/30/21 2119   paliperidone (INVEGA SUSTENNA) injection 234 mg  234 mg Intramuscular Q28 days Jesse Sans, MD   234 mg at 01/30/21 1015   pantoprazole (PROTONIX) EC tablet 40 mg  40 mg Oral Daily [name], [name], MD   40 mg at 01/31/21 0810   polyethylene glycol (MIRALAX / GLYCOLAX) packet 17 g  17 g Oral Daily [name], [name], MD   17 g at 01/30/21 0829   risperiDONE (RISPERDAL) tablet 4 mg  4 mg Oral QHS Jesse Sans, MD   4 mg at 01/30/21 2119   senna (SENOKOT) tablet 8.6 mg  1 tablet Oral Daily PRN [name], NP       sertraline (ZOLOFT) tablet 100 mg  100 mg Oral Daily [name], [name], MD   100 mg at 01/31/21 0811   traZODone (DESYREL) tablet 50 mg  50 mg Oral QHS PRN [name], NP   50 mg at 01/28/21 2128   ziprasidone (GEODON) injection 20 mg  20 mg Intramuscular Q8H PRN Jesse Sans, MD        Lab Results:  No results found for this or any previous visit (from the past 48 hour(s)).   Blood Alcohol level:  Lab Results  Component Value Date   ETH <10 01/20/2021    Metabolic Disorder Labs: Lab Results  Component Value Date   HGBA1C 5.7 (H) 01/22/2021   MPG 116.89 01/22/2021   No results found for: PROLACTIN Lab Results  Component Value Date   CHOL 176 01/22/2021   TRIG 179 (H) 01/22/2021   HDL 47 01/22/2021   CHOLHDL 3.7 01/22/2021   VLDL 36 01/22/2021   LDLCALC 93 01/22/2021    Physical Findings: AIMS:  , ,  ,  ,    CIWA:    COWS:     Musculoskeletal: Strength & Muscle Tone: within normal limits Gait & Station: normal Patient leans: N/A  Psychiatric Specialty Exam:  Presentation  General Appearance:  Appropriate for Environment  Eye Contact: fair Speech:Clear and Coherent; Normal Rate  Speech Volume:Normal  Handedness:Right   Mood and Affect  Mood:Euthymic  Affect:Congruent   Thought Process  Thought Processes:Coherent  Descriptions of Associations:Loose  Orientation:Full (Time, Place and Person)  Thought  Content:Rumination  History of Schizophrenia/Schizoaffective disorder:Yes  Duration of Psychotic Symptoms:Greater than six months  Hallucinations: AVH Ideas of Reference:Paranoia  Suicidal Thoughts:No data recorded Homicidal Thoughts: yes  Sensorium  Memory:Immediate Good  Judgment:Impaired  Insight:Poor   Executive Functions  Concentration:Fair  Attention Span:Fair  Recall:Fair  Fund of Knowledge:Fair  Language:Fair   Psychomotor Activity  Psychomotor Activity: No data recorded  Assets  Assets:Desire for Improvement; Financial Resources/Insurance; Housing; Resilience; Physical Health; Social Support   Sleep  Sleep: No data recorded   Physical Exam: Physical Exam Vitals and nursing note reviewed.  HENT:     Head: Normocephalic.     Nose: No congestion or rhinorrhea.  Eyes:     General:        Right eye: No discharge.        Left eye: No discharge.  Cardiovascular:     Rate and Rhythm: Normal rate.     Pulses: Normal pulses.  Pulmonary:     Effort: Pulmonary effort is normal.  Musculoskeletal:        General: Normal range of motion.     Cervical back: Normal range of motion.  Skin:    General: Skin is warm and dry.  Neurological:     Mental Status: He is alert and oriented to person, place, and time.  Psychiatric:        Mood and Affect: Mood is anxious.        Behavior: Behavior normal. Behavior is cooperative.   Review of Systems  Gastrointestinal:  Positive for constipation (chronic, improving).  Psychiatric/Behavioral:  Positive for depression and hallucinations. The patient is nervous/anxious.   All other  systems reviewed and are negative. Blood pressure 138/81, pulse 79, temperature 98.3 F (36.8 C), temperature source Oral, resp. rate 18, height 5\' 10"  (1.778 m), weight 90.7 kg, SpO2 100 %. Body mass index is 28.7 kg/m.   Treatment Plan Summary: Daily contact with patient to assess and evaluate symptoms and progress in treatment and Medication management   Patient with diagnosis of schizoaffective disorder, bipolar type.  Currently making medication changes,   Schizoaffective disorde, bipolar type - Continue Clozapine 100 mg oral daily at bedtime - Continue benzatropine tablet 0.5 mg oral 2 times daily prophylaxis for EPS - Discontinued  olanzapine Zydis, 5 mg oral daily at bedtime - Continue Risperdal 4 mg daily at bedtime - injection 234 mg IM given 01/30/21 - Continue sertraline tablet 100 mg oral daily - Continue lithium carbonate CR tablet 600 mg BID (increased from 600 mg nightly with level 0.20)  Chronic constipation - Continue docusate sodium capsule 200 mg oral 2 times daily - Continue MiraLAX 17 g oral daily - Continue Senokot tablet 8.6 mg oral daily as needed for constipation  Overactive bladder - Continue Ditropan XL 24-hour tablet 10 mg p.o. at bedtime  Supplementation - Continue Vitamin C tablet 500 mg oral daily  GERD - Continue Protonix EC tablet 40 mg daily  Anxiety/agitation - Continue hydroxyzine tablet 50 mg oral every 6 hours as needed - Continue olanzapine tablet 10 mg oral every 6 hours as needed for agitation - Continue ziprasidone injection 20 mg IM every 8 hours as needed for agitation  Smoking cessation/nicotine replacement -  Continue nicotine gum 2 mg oral as needed  Insomnia -Continue trazodone 50 mg tab oral at bedtime  PRN, Other  --Continue Tylenol 650 mg po every 6 hrs prn pain --Continue MAALOX/MYLANTA 30 mL po every 4 hrs prn indigestion --Continue Milk of Magnesia  30 mL po daily prn constipation       Beverly Sessions, MD 01/31/2021, 1:04 PM Patient ID: Lawrence Morgan, male   DOB: May 26, 1981, 40 y.o.   MRN: [id number]

## 2021-01-31 NOTE — Plan of Care (Signed)
  Problem: Education: Goal: Ability to state activities that reduce stress will improve Outcome: Not Progressing   Problem: Coping: Goal: Ability to identify and develop effective coping behavior will improve Outcome: Not Progressing   Problem: Self-Concept: Goal: Ability to identify factors that promote anxiety will improve Outcome: Not Progressing Goal: Level of anxiety will decrease Outcome: Not Progressing Goal: Ability to modify response to factors that promote anxiety will improve Outcome: Not Progressing   Problem: Education: Goal: Utilization of techniques to improve thought processes will improve Outcome: Not Progressing Goal: Knowledge of the prescribed therapeutic regimen will improve Outcome: Not Progressing   Problem: Activity: Goal: Interest or engagement in leisure activities will improve Outcome: Not Progressing Goal: Imbalance in normal sleep/wake cycle will improve Outcome: Not Progressing   Problem: Coping: Goal: Coping ability will improve Outcome: Not Progressing Goal: Will verbalize feelings Outcome: Not Progressing   Problem: Health Behavior/Discharge Planning: Goal: Ability to make decisions will improve Outcome: Not Progressing Goal: Compliance with therapeutic regimen will improve Outcome: Not Progressing   Problem: Role Relationship: Goal: Will demonstrate positive changes in social behaviors and relationships Outcome: Not Progressing   Problem: Safety: Goal: Ability to disclose and discuss suicidal ideas will improve Outcome: Not Progressing Goal: Ability to identify and utilize support systems that promote safety will improve Outcome: Not Progressing   Problem: Self-Concept: Goal: Will verbalize positive feelings about self Outcome: Not Progressing Goal: Level of anxiety will decrease Outcome: Not Progressing   Problem: Education: Goal: Knowledge of [facility] Education information/materials will improve Outcome: Not  Progressing Goal: Emotional status will improve Outcome: Not Progressing Goal: Mental status will improve Outcome: Not Progressing Goal: Verbalization of understanding the information provided will improve Outcome: Not Progressing   Problem: Activity: Goal: Interest or engagement in activities will improve Outcome: Not Progressing Goal: Sleeping patterns will improve Outcome: Not Progressing   Problem: Coping: Goal: Ability to verbalize frustrations and anger appropriately will improve Outcome: Not Progressing Goal: Ability to demonstrate self-control will improve Outcome: Not Progressing   Problem: Health Behavior/Discharge Planning: Goal: Identification of resources available to assist in meeting health care needs will improve Outcome: Not Progressing Goal: Compliance with treatment plan for underlying cause of condition will improve Outcome: Not Progressing   Problem: Physical Regulation: Goal: Ability to maintain clinical measurements within normal limits will improve Outcome: Not Progressing   Problem: Safety: Goal: Periods of time without injury will increase Outcome: Not Progressing   

## 2021-01-31 NOTE — Group Note (Signed)
LCSW Group Therapy Note  Group Date: 01/31/2021 Start Time: 1305 End Time: 1405   Type of Therapy and Topic:  Group Therapy - Healthy vs Unhealthy Coping Skills  Participation Level:  Active   Description of Group The focus of this group was to determine what unhealthy coping techniques typically are used by group members and what healthy coping techniques would be helpful in coping with various problems. Patients were guided in becoming aware of the differences between healthy and unhealthy coping techniques. Patients were asked to identify 2-3 healthy coping skills they would like to learn to use more effectively.  Therapeutic Goals Patients learned that coping is what human beings do all day long to deal with various situations in their lives Patients defined and discussed healthy vs unhealthy coping techniques Patients identified their preferred coping techniques and identified whether these were healthy or unhealthy Patients determined 2-3 healthy coping skills they would like to become more familiar with and use more often. Patients provided support and ideas to each other   Summary of Patient Progress:  During group, patient expressed how important it is for him to take the correct dose of medication and reflected on a time he felt overmedicated. Patient proved open to input from peers and feedback from CSW. Patient left group to get a drink, returned, and left group early; however, patient remained alert and engaged while present in group. Patient was not present for the discussion of new coping techniques.   Therapeutic Modalities Cognitive Behavioral Therapy Motivational Interviewing  Norberto Sorenson, Theresia Majors 01/31/2021  4:52 PM

## 2021-01-31 NOTE — Plan of Care (Signed)
Pt rates depression 7/10 and anxiety 6/10. Pt denies HI and VH. He endorses passive SI with no plan and verbally contracts for safety. Pt has AH that command him to commit SI. Torrie Mayers RN Problem: Education: Goal: Ability to state activities that reduce stress will improve Outcome: Progressing   Problem: Coping: Goal: Ability to identify and develop effective coping behavior will improve Outcome: Progressing   Problem: Self-Concept: Goal: Ability to identify factors that promote anxiety will improve Outcome: Progressing Goal: Level of anxiety will decrease Outcome: Progressing Goal: Ability to modify response to factors that promote anxiety will improve Outcome: Progressing   Problem: Education: Goal: Utilization of techniques to improve thought processes will improve Outcome: Progressing Goal: Knowledge of the prescribed therapeutic regimen will improve Outcome: Progressing   Problem: Activity: Goal: Interest or engagement in leisure activities will improve Outcome: Progressing Goal: Imbalance in normal sleep/wake cycle will improve Outcome: Progressing   Problem: Coping: Goal: Coping ability will improve Outcome: Progressing Goal: Will verbalize feelings Outcome: Progressing   Problem: Health Behavior/Discharge Planning: Goal: Ability to make decisions will improve Outcome: Progressing Goal: Compliance with therapeutic regimen will improve Outcome: Progressing   Problem: Role Relationship: Goal: Will demonstrate positive changes in social behaviors and relationships Outcome: Progressing   Problem: Safety: Goal: Ability to disclose and discuss suicidal ideas will improve Outcome: Progressing Goal: Ability to identify and utilize support systems that promote safety will improve Outcome: Progressing   Problem: Self-Concept: Goal: Will verbalize positive feelings about self Outcome: Progressing Goal: Level of anxiety will decrease Outcome: Progressing    Problem: Education: Goal: Knowledge of [facility] Education information/materials will improve Outcome: Progressing Goal: Emotional status will improve Outcome: Progressing Goal: Mental status will improve Outcome: Progressing Goal: Verbalization of understanding the information provided will improve Outcome: Progressing   Problem: Activity: Goal: Interest or engagement in activities will improve Outcome: Progressing Goal: Sleeping patterns will improve Outcome: Progressing   Problem: Coping: Goal: Ability to verbalize frustrations and anger appropriately will improve Outcome: Progressing Goal: Ability to demonstrate self-control will improve Outcome: Progressing   Problem: Health Behavior/Discharge Planning: Goal: Identification of resources available to assist in meeting health care needs will improve Outcome: Progressing Goal: Compliance with treatment plan for underlying cause of condition will improve Outcome: Progressing   Problem: Physical Regulation: Goal: Ability to maintain clinical measurements within normal limits will improve Outcome: Progressing   Problem: Safety: Goal: Periods of time without injury will increase Outcome: Progressing

## 2021-01-31 NOTE — Progress Notes (Signed)
Patient went outside and played basketball for recreation. Denies SI and HI. Contracts for safety Continues to appear to be internally preoccupied.Medication compliant

## 2021-02-01 DIAGNOSIS — F250 Schizoaffective disorder, bipolar type: Secondary | ICD-10-CM | POA: Diagnosis not present

## 2021-02-01 MED ORDER — TRAZODONE HCL 50 MG PO TABS
50.0000 mg | ORAL_TABLET | Freq: Every day | ORAL | Status: DC
  Administered 2021-02-01 – 2021-02-02 (×2): 50 mg via ORAL
  Filled 2021-02-01 (×3): qty 1

## 2021-02-01 NOTE — Group Note (Addendum)
[facility] LCSW Group Therapy Note   Group Date: 02/01/2021 Start Time: 1300 End Time: 1400   Type of Therapy and Topic: Group Therapy: Avoiding Self-Sabotaging and Enabling Behaviors  Participation Level: Did Not Attend  Mood:  Description of Group:  In this group, patients will learn how to identify obstacles, self-sabotaging and enabling behaviors, as well as: what are they, why do we do them and what needs these behaviors meet. Discuss unhealthy relationships and how to have positive healthy boundaries with those that sabotage and enable. Explore aspects of self-sabotage and enabling in yourself and how to limit these self-destructive behaviors in everyday life.   Therapeutic Goals: 1. Patient will identify one obstacle that relates to self-sabotage and enabling behaviors 2. Patient will identify one personal self-sabotaging or enabling behavior they did prior to admission 3. Patient will state a plan to change the above identified behavior 4. Patient will demonstrate ability to communicate their needs through discussion and/or role play.    Summary of Patient Progress: Patient did not attend group despite encouraged participation.    Therapeutic Modalities:  Cognitive Behavioral Therapy Person-Centered Therapy Motivational Interviewing    Norberto Sorenson, Theresia Majors 02/01/2021 4:16PM

## 2021-02-01 NOTE — Plan of Care (Signed)
  Problem: Education: Goal: Ability to state activities that reduce stress will improve Outcome: Progressing   Problem: Coping: Goal: Ability to identify and develop effective coping behavior will improve Outcome: Progressing   Problem: Self-Concept: Goal: Ability to identify factors that promote anxiety will improve Outcome: Progressing Goal: Level of anxiety will decrease Outcome: Progressing Goal: Ability to modify response to factors that promote anxiety will improve Outcome: Progressing   Problem: Education: Goal: Utilization of techniques to improve thought processes will improve Outcome: Progressing Goal: Knowledge of the prescribed therapeutic regimen will improve Outcome: Progressing   Problem: Activity: Goal: Interest or engagement in leisure activities will improve Outcome: Progressing Goal: Imbalance in normal sleep/wake cycle will improve Outcome: Progressing   Problem: Coping: Goal: Coping ability will improve Outcome: Progressing Goal: Will verbalize feelings Outcome: Progressing   Problem: Health Behavior/Discharge Planning: Goal: Ability to make decisions will improve Outcome: Progressing Goal: Compliance with therapeutic regimen will improve Outcome: Progressing   Problem: Role Relationship: Goal: Will demonstrate positive changes in social behaviors and relationships Outcome: Progressing   Problem: Safety: Goal: Ability to disclose and discuss suicidal ideas will improve Outcome: Progressing Goal: Ability to identify and utilize support systems that promote safety will improve Outcome: Progressing   Problem: Self-Concept: Goal: Will verbalize positive feelings about self Outcome: Progressing Goal: Level of anxiety will decrease Outcome: Progressing   Problem: Education: Goal: Knowledge of [facility] Education information/materials will improve Outcome: Progressing Goal: Emotional status will improve Outcome: Progressing Goal:  Mental status will improve Outcome: Progressing Goal: Verbalization of understanding the information provided will improve Outcome: Progressing   Problem: Activity: Goal: Interest or engagement in activities will improve Outcome: Progressing Goal: Sleeping patterns will improve Outcome: Progressing   Problem: Coping: Goal: Ability to verbalize frustrations and anger appropriately will improve Outcome: Progressing Goal: Ability to demonstrate self-control will improve Outcome: Progressing   Problem: Health Behavior/Discharge Planning: Goal: Identification of resources available to assist in meeting health care needs will improve Outcome: Progressing Goal: Compliance with treatment plan for underlying cause of condition will improve Outcome: Progressing   Problem: Physical Regulation: Goal: Ability to maintain clinical measurements within normal limits will improve Outcome: Progressing   Problem: Safety: Goal: Periods of time without injury will increase Outcome: Progressing   

## 2021-02-01 NOTE — Plan of Care (Signed)
Patient goes in & out of his room more frequently with an anxious affect.Patient stated that he is seeing people stabbing on his head multiple times and passive SI at times. Patient contracts for safety. Denies HI and AH. Patient appropriate with staff & peers. Appetite and energy level good. Support and encouragement given.

## 2021-02-01 NOTE — Progress Notes (Signed)
Patient pointed at the trash can in his room as if he saw something, but when asked, denied seeing things. Calm and cooperative. Med compliant

## 2021-02-01 NOTE — Progress Notes (Signed)
[facility] MD Progress Note  02/01/2021 11:27 AM Lawrence Morgan  MRN:  [id number]    CC: "I didn't sleep well, still see images"  Subjective: 40 year old male who presented to the ED via BPD from [facility] where he was threatening to "cut people's heads off" and throwing chairs.  Pr seen for follow up, chart reviewed.  Nurse reports- Pt has been social and went to groups. Pt states that he is still SI and had a thought that he got a gun here and shot himself in the head.Pt verbally contracted for safety. Patient pointed at the trash can in his room as if he saw something, but when asked, denied seeing things. Calm and cooperative. Med compliant   Her was lying in his bed,  He continues to have auditory hallucinations of voices taunting him, and visual hallucinations of " images" coming out of the wall .  He denies any suicidal ideations, continues to have some homicidal ideations towards group home members.   Principal Problem: Schizoaffective disorder, bipolar type (HCC) Diagnosis: Principal Problem:   Schizoaffective disorder, bipolar type (HCC) Active Problems:   Chronic constipation   GERD (gastroesophageal reflux disease)   Ingestion of foreign body  Total Time spent with patient: 30 min  Past Psychiatric History: See H&P  Past Medical History: History reviewed. No pertinent past medical history.  Past Surgical History:  Procedure Laterality Date   ESOPHAGOGASTRODUODENOSCOPY (EGD) WITH PROPOFOL N/A 01/20/2021   Procedure: ESOPHAGOGASTRODUODENOSCOPY (EGD) WITH PROPOFOL;  Surgeon: Midge Minium, MD;  Location: [facility] ENDOSCOPY;  Service: Endoscopy;  Laterality: N/A;   Family History: History reviewed. No pertinent family history. Family Psychiatric  History: See H&P Social History:  Social History   Substance and Sexual Activity  Alcohol Use Never     Social History   Substance and Sexual Activity  Drug Use Never    Social History   Socioeconomic History   Marital status: Single     Spouse name: Not on file   Number of children: Not on file   Years of education: Not on file   Highest education level: Not on file  Occupational History   Not on file  Tobacco Use   Smoking status: Every Day    Packs/day: 1.50    Types: Cigarettes   Smokeless tobacco: Never  Vaping Use   Vaping Use: Some days   Substances: Nicotine  Substance and Sexual Activity   Alcohol use: Never   Drug use: Never   Sexual activity: Not Currently  Other Topics Concern   Not on file  Social History Narrative   Not on file   Social Determinants of Health   Financial Resource Strain: Not on file  Food Insecurity: Not on file  Transportation Needs: Not on file  Physical Activity: Not on file  Stress: Not on file  Social Connections: Not on file   Additional Social History:   Sleep: Good  Appetite:  Good  Current Medications: Current Facility-Administered Medications  Medication Dose Route Frequency Provider Last Rate Last Admin   acetaminophen (TYLENOL) tablet 650 mg  650 mg Oral Q6H PRN [name], [name], MD   650 mg at 01/29/21 0754   alum & mag hydroxide-simeth (MAALOX/MYLANTA) 200-200-20 MG/5ML suspension 30 mL  30 mL Oral Q4H PRN [name], [name], MD       ascorbic acid (VITAMIN C) tablet 500 mg  500 mg Oral Daily Jesse Sans, MD   500 mg at 02/01/21 0817   benztropine (COGENTIN) tablet 0.5 mg  0.5 mg Oral BID [name], [name], MD   0.5 mg at 02/01/21 0816   cloZAPine (CLOZARIL) tablet 100 mg  100 mg Oral QHS [name], Megan M, MD   100 mg at 01/31/21 2102   docusate sodium (COLACE) capsule 200 mg  200 mg Oral BID [name], Jackquline DenmarkJohn T, MD   200 mg at 02/01/21 0815   hydrOXYzine (ATARAX/VISTARIL) tablet 50 mg  50 mg Oral Q6H PRN [name], Jackquline DenmarkJohn T, MD   50 mg at 01/29/21 0753   ibuprofen (ADVIL) tablet 600 mg  600 mg Oral Q6H PRN [name], Megan M, MD       lidocaine (LIDODERM) 5 % 1 patch  1 patch Transdermal Q24H [name], Megan M, MD   1 patch at 01/30/21 1335   lithium carbonate  (LITHOBID) CR tablet 600 mg  600 mg Oral Q12H [name], Megan M, MD   600 mg at 02/01/21 0815   magnesium hydroxide (MILK OF MAGNESIA) suspension 30 mL  30 mL Oral Daily PRN [name], Jackquline DenmarkJohn T, MD       nicotine polacrilex (NICORETTE) gum 2 mg  2 mg Oral PRN Vanetta MuldersBarthold, Louise F, NP       OLANZapine (ZYPREXA) tablet 10 mg  10 mg Oral Q6H PRN [name], Megan M, MD       oxybutynin (DITROPAN-XL) 24 hr tablet 10 mg  10 mg Oral QHS [name], Megan M, MD   10 mg at 01/31/21 2102   paliperidone (INVEGA SUSTENNA) injection 234 mg  234 mg Intramuscular Q28 days [name], Megan M, MD   234 mg at 01/30/21 1015   pantoprazole (PROTONIX) EC tablet 40 mg  40 mg Oral Daily [name], Jackquline DenmarkJohn T, MD   40 mg at 02/01/21 0818   polyethylene glycol (MIRALAX / GLYCOLAX) packet 17 g  17 g Oral Daily [name], Jackquline DenmarkJohn T, MD   17 g at 02/01/21 0816   risperiDONE (RISPERDAL) tablet 4 mg  4 mg Oral QHS [name], Megan M, MD   4 mg at 01/31/21 2102   senna (SENOKOT) tablet 8.6 mg  1 tablet Oral Daily PRN Vanetta MuldersBarthold, Louise F, NP       sertraline (ZOLOFT) tablet 100 mg  100 mg Oral Daily [name], [name], MD   100 mg at 02/01/21 0815   traZODone (DESYREL) tablet 50 mg  50 mg Oral QHS PRN Gillermo Murdochhompson, Jacqueline, NP   50 mg at 01/28/21 2128   ziprasidone (GEODON) injection 20 mg  20 mg Intramuscular Q8H PRN [name], Megan M, MD        Lab Results:  No results found for this or any previous visit (from the past 48 hour(s)).   Blood Alcohol level:  Lab Results  Component Value Date   ETH <10 01/20/2021    Metabolic Disorder Labs: Lab Results  Component Value Date   HGBA1C 5.7 (H) 01/22/2021   MPG 116.89 01/22/2021   No results found for: PROLACTIN Lab Results  Component Value Date   CHOL 176 01/22/2021   TRIG 179 (H) 01/22/2021   HDL 47 01/22/2021   CHOLHDL 3.7 01/22/2021   VLDL 36 01/22/2021   LDLCALC 93 01/22/2021    Physical Findings: AIMS:  , ,  ,  ,    CIWA:    COWS:     Musculoskeletal: Strength & Muscle Tone: within  normal limits Gait & Station: normal Patient leans: N/A  Psychiatric Specialty Exam:  Presentation  General Appearance: Appropriate for Environment  Eye Contact: fair Speech:Clear and Coherent; Normal Rate  Speech Volume:Normal  Handedness:Right   Mood and Affect  Mood:Euthymic  Affect:Congruent   Thought Process  Thought Processes:Coherent  Descriptions of Associations:Loose  Orientation:Full (Time, Place and Person)  Thought Content:Rumination  History of Schizophrenia/Schizoaffective disorder:Yes  Duration of Psychotic Symptoms:Greater than six months  Hallucinations: AVH Ideas of Reference:Paranoia  Suicidal Thoughts: internitent Homicidal Thoughts: yes  Sensorium  Memory:Immediate Good  Judgment:Impaired  Insight:Poor   Executive Functions  Concentration:Fair  Attention Span:Fair  Recall:Fair  Fund of Knowledge:Fair  Language:Fair   Psychomotor Activity  Psychomotor Activity: No data recorded  Assets  Assets:Desire for Improvement; Financial Resources/Insurance; Housing; Resilience; Physical Health; Social Support   Sleep  Sleep: No data recorded   Physical Exam: Physical Exam Vitals and nursing note reviewed.  HENT:     Head: Normocephalic.     Nose: No congestion or rhinorrhea.  Eyes:     General:        Right eye: No discharge.        Left eye: No discharge.  Cardiovascular:     Rate and Rhythm: Normal rate.     Pulses: Normal pulses.  Pulmonary:     Effort: Pulmonary effort is normal.  Musculoskeletal:        General: Normal range of motion.     Cervical back: Normal range of motion.  Skin:    General: Skin is warm and dry.  Neurological:     Mental Status: He is alert and oriented to person, place, and time.  Psychiatric:        Mood and Affect: Mood is anxious.        Behavior: Behavior normal. Behavior is cooperative.   Review of Systems  Gastrointestinal:  Positive for constipation (chronic, improving).   Psychiatric/Behavioral:  Positive for depression and hallucinations. The patient is nervous/anxious.   All other systems reviewed and are negative. Blood pressure 138/81, pulse 79, temperature 98.3 F (36.8 C), temperature source Oral, resp. rate 18, height 5\' 10"  (1.778 m), weight 90.7 kg, SpO2 100 %. Body mass index is 28.7 kg/m.   Treatment Plan Summary: Daily contact with patient to assess and evaluate symptoms and progress in treatment and Medication managementpt still psychotic and voicing intermittent SI/HI. QTc-428. Keep scheduled trazodone for sleep.    Patient with diagnosis of schizoaffective disorder, bipolar type.  Currently making medication changes,   Schizoaffective disorde, bipolar type - Continue Clozapine 100 mg oral daily at bedtime - Continue benzatropine tablet 0.5 mg oral 2 times daily prophylaxis for EPS - Discontinued  olanzapine Zydis, 5 mg oral daily at bedtime - Continue Risperdal 4 mg daily at bedtime - injection 234 mg IM given 01/30/21 - Continue sertraline tablet 100 mg oral daily - Continue lithium carbonate CR tablet 600 mg BID (increased from 600 mg nightly with level 0.20)  Chronic constipation - Continue docusate sodium capsule 200 mg oral 2 times daily - Continue MiraLAX 17 g oral daily - Continue Senokot tablet 8.6 mg oral daily as needed for constipation  Overactive bladder - Continue Ditropan XL 24-hour tablet 10 mg p.o. at bedtime  Supplementation - Continue Vitamin C tablet 500 mg oral daily  GERD - Continue Protonix EC tablet 40 mg daily  Anxiety/agitation - Continue hydroxyzine tablet 50 mg oral every 6 hours as needed - Continue olanzapine tablet 10 mg oral every 6 hours as needed for agitation - Continue ziprasidone injection 20 mg IM every 8 hours as needed for agitation  Smoking cessation/nicotine replacement -  Continue nicotine  gum 2 mg oral as needed  Insomnia-  -Continue trazodone 50 mg tab oral at  bedtime  PRN, Other  --Continue Tylenol 650 mg po every 6 hrs prn pain --Continue MAALOX/MYLANTA 30 mL po every 4 hrs prn indigestion --Continue Milk of Magnesia 30 mL po daily prn constipation       Beverly Sessions, MD 02/01/2021, 11:27 AM Patient ID: Jason Fila, male   DOB: 1980-06-30, 40 y.o.   MRN: [id number] Patient ID: Lakyn Mantione, male   DOB: [birth year], 40 y.o.   MRN: [id number]

## 2021-02-02 DIAGNOSIS — F250 Schizoaffective disorder, bipolar type: Secondary | ICD-10-CM | POA: Diagnosis not present

## 2021-02-02 MED ORDER — OLANZAPINE 5 MG PO TBDP
5.0000 mg | ORAL_TABLET | Freq: Every day | ORAL | Status: DC
  Filled 2021-02-02: qty 1

## 2021-02-02 NOTE — Progress Notes (Addendum)
[facility] MD Progress Note  02/02/2021 12:42 PM Alanzo Lamb  MRN:  [id number]    CC: "I'm still seeing things"  Subjective: 40 year old male who presented to the ED via BPD from [facility] where he was threatening to "cut people's heads off" and throwing chairs. No acute events overnight, medication compliant, attending to ADLs. Patient seen one-on-one today. He continues to endorse some passive suicidal ideations, homicidal ideations towards group home, auditory hallucinations, and visual hallucinations of people coming out of the wall to kill him. He is unwilling to increase his clozapine despite multiple attempts to explain medication indications. He is willing to stay on Invega injections and restart Zyprexa.  Principal Problem: Schizoaffective disorder, bipolar type (HCC) Diagnosis: Principal Problem:   Schizoaffective disorder, bipolar type (HCC) Active Problems:   Chronic constipation   GERD (gastroesophageal reflux disease)   Ingestion of foreign body  Total Time spent with patient: 15 minutes  Past Psychiatric History: See H&P  Past Medical History: History reviewed. No pertinent past medical history.  Past Surgical History:  Procedure Laterality Date   ESOPHAGOGASTRODUODENOSCOPY (EGD) WITH PROPOFOL N/A 01/20/2021   Procedure: ESOPHAGOGASTRODUODENOSCOPY (EGD) WITH PROPOFOL;  Surgeon: Midge Minium, MD;  Location: [facility] ENDOSCOPY;  Service: Endoscopy;  Laterality: N/A;   Family History: History reviewed. No pertinent family history. Family Psychiatric  History: See H&P Social History:  Social History   Substance and Sexual Activity  Alcohol Use Never     Social History   Substance and Sexual Activity  Drug Use Never    Social History   Socioeconomic History   Marital status: Single    Spouse name: Not on file   Number of children: Not on file   Years of education: Not on file   Highest education level: Not on file  Occupational History   Not on file  Tobacco Use   Smoking  status: Every Day    Packs/day: 1.50    Types: Cigarettes   Smokeless tobacco: Never  Vaping Use   Vaping Use: Some days   Substances: Nicotine  Substance and Sexual Activity   Alcohol use: Never   Drug use: Never   Sexual activity: Not Currently  Other Topics Concern   Not on file  Social History Narrative   Not on file   Social Determinants of Health   Financial Resource Strain: Not on file  Food Insecurity: Not on file  Transportation Needs: Not on file  Physical Activity: Not on file  Stress: Not on file  Social Connections: Not on file   Additional Social History:   Sleep: Good  Appetite:  Good  Current Medications: Current Facility-Administered Medications  Medication Dose Route Frequency Provider Last Rate Last Admin   acetaminophen (TYLENOL) tablet 650 mg  650 mg Oral Q6H PRN [name], [name], MD   650 mg at 01/29/21 0754   alum & mag hydroxide-simeth (MAALOX/MYLANTA) 200-200-20 MG/5ML suspension 30 mL  30 mL Oral Q4H PRN [name], [name], MD       ascorbic acid (VITAMIN C) tablet 500 mg  500 mg Oral Daily Les Pou M, MD   500 mg at 02/02/21 2094   benztropine (COGENTIN) tablet 0.5 mg  0.5 mg Oral BID [name], [name], MD   0.5 mg at 02/02/21 0828   cloZAPine (CLOZARIL) tablet 100 mg  100 mg Oral QHS Jesse Sans, MD   100 mg at 02/01/21 2013   docusate sodium (COLACE) capsule 200 mg  200 mg Oral BID [name], [name], MD  200 mg at 02/02/21 0828   hydrOXYzine (ATARAX/VISTARIL) tablet 50 mg  50 mg Oral Q6H PRN [name], [name], MD   50 mg at 02/02/21 0115   ibuprofen (ADVIL) tablet 600 mg  600 mg Oral Q6H PRN [name],  M, MD       lidocaine (LIDODERM) 5 % 1 patch  1 patch Transdermal Q24H [name],  M, MD   1 patch at 01/30/21 1335   lithium carbonate (LITHOBID) CR tablet 600 mg  600 mg Oral Q12H [name],  M, MD   600 mg at 02/02/21 0827   magnesium hydroxide (MILK OF MAGNESIA) suspension 30 mL  30 mL Oral Daily PRN [name], Jackquline DenmarkJohn T, MD        nicotine polacrilex (NICORETTE) gum 2 mg  2 mg Oral PRN Vanetta MuldersBarthold, Louise F, NP       OLANZapine (ZYPREXA) tablet 10 mg  10 mg Oral Q6H PRN [name],  M, MD       OLANZapine zydis (ZYPREXA) disintegrating tablet 5 mg  5 mg Oral QHS [name],  M, MD       oxybutynin (DITROPAN-XL) 24 hr tablet 10 mg  10 mg Oral QHS [name],  M, MD   10 mg at 02/01/21 2013   paliperidone (INVEGA SUSTENNA) injection 234 mg  234 mg Intramuscular Q28 days [name],  M, MD   234 mg at 01/30/21 1015   pantoprazole (PROTONIX) EC tablet 40 mg  40 mg Oral Daily [name], Jackquline DenmarkJohn T, MD   40 mg at 02/02/21 0828   polyethylene glycol (MIRALAX / GLYCOLAX) packet 17 g  17 g Oral Daily [name], Jackquline DenmarkJohn T, MD   17 g at 02/02/21 0832   risperiDONE (RISPERDAL) tablet 4 mg  4 mg Oral QHS [name],  M, MD   4 mg at 02/01/21 2012   senna (SENOKOT) tablet 8.6 mg  1 tablet Oral Daily PRN Vanetta MuldersBarthold, Louise F, NP       sertraline (ZOLOFT) tablet 100 mg  100 mg Oral Daily [name], [name], MD   100 mg at 02/02/21 16100828   traZODone (DESYREL) tablet 50 mg  50 mg Oral QHS PRN Gillermo Murdochhompson, Jacqueline, NP   50 mg at 01/28/21 2128   traZODone (DESYREL) tablet 50 mg  50 mg Oral QHS Beverly SessionsSubedi, Jagannath, MD   50 mg at 02/01/21 2244   ziprasidone (GEODON) injection 20 mg  20 mg Intramuscular Q8H PRN [name],  M, MD        Lab Results:  No results found for this or any previous visit (from the past 48 hour(s)).   Blood Alcohol level:  Lab Results  Component Value Date   ETH <10 01/20/2021    Metabolic Disorder Labs: Lab Results  Component Value Date   HGBA1C 5.7 (H) 01/22/2021   MPG 116.89 01/22/2021   No results found for: PROLACTIN Lab Results  Component Value Date   CHOL 176 01/22/2021   TRIG 179 (H) 01/22/2021   HDL 47 01/22/2021   CHOLHDL 3.7 01/22/2021   VLDL 36 01/22/2021   LDLCALC 93 01/22/2021    Physical Findings: AIMS:  , ,  ,  ,    CIWA:    COWS:     Musculoskeletal: Strength & Muscle Tone: within  normal limits Gait & Station: normal Patient leans: N/A  Psychiatric Specialty Exam:  Presentation  General Appearance: Appropriate for Environment  Eye Contact:Good  Speech:Clear and Coherent; Normal Rate  Speech Volume:Normal  Handedness:Right   Mood and Affect  Mood:Euthymic  Affect:Congruent   Thought Process  Thought Processes:Coherent  Descriptions of Associations:Loose  Orientation:Full (Time, Place and Person)  Thought Content:Rumination  History of Schizophrenia/Schizoaffective disorder:Yes  Duration of Psychotic Symptoms:Greater than six months  Hallucinations:No data recorded Ideas of Reference:Paranoia  Suicidal Thoughts:yes passive Homicidal Thoughts:yes active with plan  Sensorium  Memory:Immediate Good  Judgment:Impaired  Insight:Poor   Executive Functions  Concentration:Fair  Attention Span:Fair  Recall:Fair  Fund of Knowledge:Fair  Language:Fair   Psychomotor Activity  Psychomotor Activity: No data recorded  Assets  Assets:Desire for Improvement; Financial Resources/Insurance; Housing; Resilience; Physical Health; Social Support   Sleep  Sleep: Good   Physical Exam: Physical Exam Vitals and nursing note reviewed.  HENT:     Head: Normocephalic.     Nose: No congestion or rhinorrhea.  Eyes:     General:        Right eye: No discharge.        Left eye: No discharge.  Cardiovascular:     Rate and Rhythm: Normal rate.     Pulses: Normal pulses.  Pulmonary:     Effort: Pulmonary effort is normal.  Musculoskeletal:        General: Normal range of motion.     Cervical back: Normal range of motion.  Skin:    General: Skin is warm and dry.  Neurological:     Mental Status: He is alert and oriented to person, place, and time.  Psychiatric:        Mood and Affect: Mood is anxious.        Behavior: Behavior normal. Behavior is cooperative.   Review of Systems  Gastrointestinal:  Positive for constipation  (chronic, improving).  Psychiatric/Behavioral:  Positive for depression and hallucinations. The patient is nervous/anxious.   All other systems reviewed and are negative. Blood pressure 123/79, pulse (!) 43, temperature 98.3 F (36.8 C), temperature source Oral, resp. rate 17, height 5\' 10"  (1.778 m), weight 90.7 kg, SpO2 97 %. Body mass index is 28.7 kg/m.   Treatment Plan Summary: Daily contact with patient to assess and evaluate symptoms and progress in treatment and Medication management  02/02/2021 Update: Patient with diagnosis of schizoaffective disorder, bipolar type.  Currently making medication changes, unstable  Schizoaffective disorde, bipolar type - Continue Clozapine 100 mg oral daily at bedtime, patient unwilling to let team increase dose - Continue benzatropine tablet 0.5 mg oral 2 times daily prophylaxis for EPS - Restart olanzapine Zydis, 5 mg oral daily at bedtime and titrate to effect - Continue Risperdal 4 mg daily at bedtime - 04/04/2021 injection 234 mg IM given 01/30/21 - Continue sertraline tablet 100 mg oral daily - Continue lithium carbonate CR tablet 600 mg BID (increased from 600 mg nightly with level 0.20)  Chronic constipation - Continue docusate sodium capsule 200 mg oral 2 times daily - Continue MiraLAX 17 g oral daily - Continue Senokot tablet 8.6 mg oral daily as needed for constipation  Overactive bladder - Continue Ditropan XL 24-hour tablet 10 mg p.o. at bedtime  Supplementation - Continue Vitamin C tablet 500 mg oral daily  GERD - Continue Protonix EC tablet 40 mg daily  Anxiety/agitation - Continue hydroxyzine tablet 50 mg oral every 6 hours as needed - Continue olanzapine tablet 10 mg oral every 6 hours as needed for agitation - Continue ziprasidone injection 20 mg IM every 8 hours as needed for agitation  Smoking cessation/nicotine replacement -  Continue nicotine gum 2 mg oral as needed  Insomnia -Continue trazodone 50 mg  tab  oral at bedtime  PRN, Other  --Continue Tylenol 650 mg po every 6 hrs prn pain --Continue MAALOX/MYLANTA 30 mL po every 4 hrs prn indigestion --Continue Milk of Magnesia 30 mL po daily prn constipation       Jesse Sans, MD 02/02/2021, 12:42 PM

## 2021-02-02 NOTE — BH IP Treatment Plan (Signed)
Interdisciplinary Treatment and Diagnostic Plan Update  02/02/2021 Time of Session: 8:30AM Lawrence Morgan MRN: [id number]  Principal Diagnosis: Schizoaffective disorder, bipolar type [facility])  Secondary Diagnoses: Principal Problem:   Schizoaffective disorder, bipolar type (HCC) Active Problems:   Chronic constipation   GERD (gastroesophageal reflux disease)   Ingestion of foreign body   Current Medications:  Current Facility-Administered Medications  Medication Dose Route Frequency Provider Last Rate Last Admin   acetaminophen (TYLENOL) tablet 650 mg  650 mg Oral Q6H PRN [name], [name], MD   650 mg at 01/29/21 0754   alum & mag hydroxide-simeth (MAALOX/MYLANTA) 200-200-20 MG/5ML suspension 30 mL  30 mL Oral Q4H PRN [name], [name], MD       ascorbic acid (VITAMIN C) tablet 500 mg  500 mg Oral Daily Les Pou M, MD   500 mg at 02/02/21 4696   benztropine (COGENTIN) tablet 0.5 mg  0.5 mg Oral BID [name], [name], MD   0.5 mg at 02/02/21 2952   cloZAPine (CLOZARIL) tablet 100 mg  100 mg Oral QHS Jesse Sans, MD   100 mg at 02/01/21 2013   docusate sodium (COLACE) capsule 200 mg  200 mg Oral BID [name], [name], MD   200 mg at 02/02/21 8413   hydrOXYzine (ATARAX/VISTARIL) tablet 50 mg  50 mg Oral Q6H PRN [name], [name], MD   50 mg at 02/02/21 0115   ibuprofen (ADVIL) tablet 600 mg  600 mg Oral Q6H PRN Jesse Sans, MD       lidocaine (LIDODERM) 5 % 1 patch  1 patch Transdermal Q24H Jesse Sans, MD   1 patch at 02/02/21 1358   lithium carbonate (LITHOBID) CR tablet 600 mg  600 mg Oral Q12H Jesse Sans, MD   600 mg at 02/02/21 0827   magnesium hydroxide (MILK OF MAGNESIA) suspension 30 mL  30 mL Oral Daily PRN [name], [name], MD       nicotine polacrilex (NICORETTE) gum 2 mg  2 mg Oral PRN [name], NP       OLANZapine (ZYPREXA) tablet 10 mg  10 mg Oral Q6H PRN Jesse Sans, MD       OLANZapine zydis (ZYPREXA) disintegrating tablet 5 mg  5 mg Oral QHS  Jesse Sans, MD       oxybutynin (DITROPAN-XL) 24 hr tablet 10 mg  10 mg Oral QHS Jesse Sans, MD   10 mg at 02/01/21 2013   paliperidone (INVEGA SUSTENNA) injection 234 mg  234 mg Intramuscular Q28 days Jesse Sans, MD   234 mg at 01/30/21 1015   pantoprazole (PROTONIX) EC tablet 40 mg  40 mg Oral Daily [name], [name], MD   40 mg at 02/02/21 0828   polyethylene glycol (MIRALAX / GLYCOLAX) packet 17 g  17 g Oral Daily [name], [name], MD   17 g at 02/02/21 0832   risperiDONE (RISPERDAL) tablet 4 mg  4 mg Oral QHS Jesse Sans, MD   4 mg at 02/01/21 2012   senna (SENOKOT) tablet 8.6 mg  1 tablet Oral Daily PRN Gabriel Cirri F, NP       sertraline (ZOLOFT) tablet 100 mg  100 mg Oral Daily [name], [name], MD   100 mg at 02/02/21 0828   traZODone (DESYREL) tablet 50 mg  50 mg Oral QHS PRN [name], NP   50 mg at 01/28/21 2128   traZODone (DESYREL) tablet 50 mg  50 mg  Oral QHS Beverly Sessions, MD   50 mg at 02/01/21 2244   ziprasidone (GEODON) injection 20 mg  20 mg Intramuscular Q8H PRN Jesse Sans, MD       PTA Medications: Medications Prior to Admission  Medication Sig Dispense Refill Last Dose   benztropine (COGENTIN) 1 MG tablet Take 1 mg by mouth 2 (two) times daily.      cloZAPine (CLOZARIL) 100 MG tablet Take 500 mg by mouth at bedtime.      famotidine (PEPCID) 20 MG tablet Take 20 mg by mouth daily.      fluvoxaMINE (LUVOX) 50 MG tablet Take 50 mg by mouth at bedtime.      lithium 300 MG tablet Take 600 mg by mouth 2 (two) times daily.      OLANZapine (ZYPREXA) 15 MG tablet Take 15 mg by mouth at bedtime.      oxybutynin (DITROPAN-XL) 10 MG 24 hr tablet Take 10 mg by mouth daily.       Patient Stressors: Health problems Other: SI/HI/AVH  Patient Strengths: Barrister's clerk for treatment/growth  Treatment Modalities: Medication Management, Group therapy, Case management,  1 to 1 session with clinician, Psychoeducation,  Recreational therapy.   Physician Treatment Plan for Primary Diagnosis: Schizoaffective disorder, bipolar type (HCC) Long Term Goal(s): Improvement in symptoms so as ready for discharge   Short Term Goals: Ability to identify changes in lifestyle to reduce recurrence of condition will improve Ability to disclose and discuss suicidal ideas Ability to demonstrate self-control will improve Ability to identify and develop effective coping behaviors will improve Ability to maintain clinical measurements within normal limits will improve Compliance with prescribed medications will improve  Medication Management: Evaluate patient's response, side effects, and tolerance of medication regimen.  Therapeutic Interventions: 1 to 1 sessions, Unit Group sessions and Medication administration.  Evaluation of Outcomes: Progressing  Physician Treatment Plan for Secondary Diagnosis: Principal Problem:   Schizoaffective disorder, bipolar type (HCC) Active Problems:   Chronic constipation   GERD (gastroesophageal reflux disease)   Ingestion of foreign body  Long Term Goal(s): Improvement in symptoms so as ready for discharge   Short Term Goals: Ability to identify changes in lifestyle to reduce recurrence of condition will improve Ability to disclose and discuss suicidal ideas Ability to demonstrate self-control will improve Ability to identify and develop effective coping behaviors will improve Ability to maintain clinical measurements within normal limits will improve Compliance with prescribed medications will improve     Medication Management: Evaluate patient's response, side effects, and tolerance of medication regimen.  Therapeutic Interventions: 1 to 1 sessions, Unit Group sessions and Medication administration.  Evaluation of Outcomes: Progressing   RN Treatment Plan for Primary Diagnosis: Schizoaffective disorder, bipolar type (HCC) Long Term Goal(s): Knowledge of disease and  therapeutic regimen to maintain health will improve  Short Term Goals: Ability to remain free from injury will improve, Ability to verbalize frustration and anger appropriately will improve, Ability to demonstrate self-control, Ability to participate in decision making will improve, Ability to verbalize feelings will improve, Ability to identify and develop effective coping behaviors will improve, and Compliance with prescribed medications will improve  Medication Management: RN will administer medications as ordered by provider, will assess and evaluate patient's response and provide education to patient for prescribed medication. RN will report any adverse and/or side effects to prescribing provider.  Therapeutic Interventions: 1 on 1 counseling sessions, Psychoeducation, Medication administration, Evaluate responses to treatment, Monitor vital signs and CBGs as ordered, Perform/monitor CIWA,  COWS, AIMS and Fall Risk screenings as ordered, Perform wound care treatments as ordered.  Evaluation of Outcomes: Progressing   LCSW Treatment Plan for Primary Diagnosis: Schizoaffective disorder, bipolar type (HCC) Long Term Goal(s): Safe transition to appropriate next level of care at discharge, Engage patient in therapeutic group addressing interpersonal concerns.  Short Term Goals: Engage patient in aftercare planning with referrals and resources, Increase social support, Increase ability to appropriately verbalize feelings, Increase emotional regulation, Facilitate acceptance of mental health diagnosis and concerns, Facilitate patient progression through stages of change regarding substance use diagnoses and concerns, and Increase skills for wellness and recovery  Therapeutic Interventions: Assess for all discharge needs, 1 to 1 time with Social worker, Explore available resources and support systems, Assess for adequacy in community support network, Educate family and significant other(s) on suicide  prevention, Complete Psychosocial Assessment, Interpersonal group therapy.  Evaluation of Outcomes: Progressing   Progress in Treatment: Attending groups: Yes. Participating in groups: Yes. Taking medication as prescribed: Yes. Toleration medication: Yes. Family/Significant other contact made: Yes, individual(s) contacted:  pt's father/guardian Patient understands diagnosis: Yes. Discussing patient identified problems/goals with staff: Yes. Medical problems stabilized or resolved: Yes. Denies suicidal/homicidal ideation: Yes. Issues/concerns per patient self-inventory: No. Other: None  New problem(s) identified: No, Describe:  None  New Short Term/Long Term Goal(s): elimination of symptoms of psychosis, medication management for mood stabilization; development of comprehensive mental wellness plan. Update 01/28/21: None  Update 02/02/21: None   Patient Goals: "Get in contact with my dad and get long-term treatment."  Pt declined to sign treatment team form. Update 01/28/21: None Update 02/02/21: None   Discharge Plan or Barriers: Pt specifically mentions going to [facility]. He was informed that he did not meet criteria for [facility]'s level of care. CSW will assist pt, along with guardian feedback/approval, with development of an appropriate aftercare/discharge plan. Update 01/28/21: CSW sent referral to [facility] per pt's request. Guardian was notified. Pt was notified that placement to [facility] is unlikely. CSW was informed by group home that pt can return when psychiatrically cleared.  Update 02/02/21: None Reason for Continuation of Hospitalization: Depression Hallucinations Medication stabilization  Estimated Length of Stay: 1-7 days  Scribe for Treatment Team:  A [place], LCSWA 02/02/2021 2:33 PM

## 2021-02-02 NOTE — Progress Notes (Signed)
D: Pt alert and oriented. Pt rates depression 5/10, hopelessness 5/10, and anxiety 5/10. Pt reports energy level as low and concentration as being poor. Pt reports sleep last night as being good. Pt did receive medications for sleep and did find them helpful. Pt denies experiencing any pain at this time. Pt denies experiencing any SI/HI, or AH at this time, however endorses VH. Pt states he's seeing hell cats.   Pt mostly stayed in bed today. Pt stated that he didn't sleep well last night. Pt did get up for meals.  A: Scheduled medications administered to pt, per MD orders. Support and encouragement provided. Frequent verbal contact made. Routine safety checks conducted q15 minutes.   R: No adverse drug reactions noted. Pt verbally contracts for safety at this time. Pt complaint with medications and treatment plan. Pt interacts well with others on the unit. Pt remains safe at this time. Will continue to monitor.

## 2021-02-03 DIAGNOSIS — F250 Schizoaffective disorder, bipolar type: Secondary | ICD-10-CM | POA: Diagnosis not present

## 2021-02-03 MED ORDER — TRAZODONE HCL 100 MG PO TABS
100.0000 mg | ORAL_TABLET | Freq: Every day | ORAL | Status: DC
  Administered 2021-02-04 – 2021-02-16 (×13): 100 mg via ORAL
  Filled 2021-02-03 (×13): qty 1

## 2021-02-03 MED ORDER — OLANZAPINE 5 MG PO TBDP
10.0000 mg | ORAL_TABLET | Freq: Every day | ORAL | Status: DC
  Administered 2021-02-04: 10 mg via ORAL
  Filled 2021-02-03: qty 2

## 2021-02-03 NOTE — Progress Notes (Signed)
Pt states he is "a little better" mentally compared to this morning. Pt did not attend group however went outside. Pt has been calm and cooperative. Pt denies SI, HI and AVH. Torrie Mayers RN

## 2021-02-03 NOTE — Plan of Care (Signed)
  Problem: Education: Goal: Ability to state activities that reduce stress will improve Outcome: Not Progressing   Problem: Coping: Goal: Ability to identify and develop effective coping behavior will improve Outcome: Not Progressing   Problem: Self-Concept: Goal: Ability to identify factors that promote anxiety will improve Outcome: Not Progressing Goal: Level of anxiety will decrease Outcome: Not Progressing Goal: Ability to modify response to factors that promote anxiety will improve Outcome: Not Progressing   Problem: Education: Goal: Utilization of techniques to improve thought processes will improve Outcome: Not Progressing Goal: Knowledge of the prescribed therapeutic regimen will improve Outcome: Not Progressing   Problem: Activity: Goal: Interest or engagement in leisure activities will improve Outcome: Not Progressing Goal: Imbalance in normal sleep/wake cycle will improve Outcome: Not Progressing   Problem: Coping: Goal: Coping ability will improve Outcome: Not Progressing Goal: Will verbalize feelings Outcome: Not Progressing   Problem: Health Behavior/Discharge Planning: Goal: Ability to make decisions will improve Outcome: Not Progressing Goal: Compliance with therapeutic regimen will improve Outcome: Not Progressing   Problem: Role Relationship: Goal: Will demonstrate positive changes in social behaviors and relationships Outcome: Not Progressing   Problem: Safety: Goal: Ability to disclose and discuss suicidal ideas will improve Outcome: Not Progressing Goal: Ability to identify and utilize support systems that promote safety will improve Outcome: Not Progressing   Problem: Self-Concept: Goal: Will verbalize positive feelings about self Outcome: Not Progressing Goal: Level of anxiety will decrease Outcome: Not Progressing   Problem: Education: Goal: Knowledge of [facility] Education information/materials will improve Outcome: Not  Progressing Goal: Emotional status will improve Outcome: Not Progressing Goal: Mental status will improve Outcome: Not Progressing Goal: Verbalization of understanding the information provided will improve Outcome: Not Progressing   Problem: Activity: Goal: Interest or engagement in activities will improve Outcome: Not Progressing Goal: Sleeping patterns will improve Outcome: Not Progressing   Problem: Coping: Goal: Ability to verbalize frustrations and anger appropriately will improve Outcome: Not Progressing Goal: Ability to demonstrate self-control will improve Outcome: Not Progressing   Problem: Health Behavior/Discharge Planning: Goal: Identification of resources available to assist in meeting health care needs will improve Outcome: Not Progressing Goal: Compliance with treatment plan for underlying cause of condition will improve Outcome: Not Progressing   Problem: Physical Regulation: Goal: Ability to maintain clinical measurements within normal limits will improve Outcome: Not Progressing   Problem: Safety: Goal: Periods of time without injury will increase Outcome: Not Progressing

## 2021-02-03 NOTE — Plan of Care (Signed)
Pt rates depression 8/10, anxiety 7/10 and hopelessness 10/10. Pt SI and HI. Pt has AVH. Pt was educated on care plan and verbalizes understanding. Torrie Mayers RN Problem: Education: Goal: Ability to state activities that reduce stress will improve Outcome: Progressing   Problem: Coping: Goal: Ability to identify and develop effective coping behavior will improve Outcome: Progressing   Problem: Self-Concept: Goal: Ability to identify factors that promote anxiety will improve Outcome: Progressing Goal: Level of anxiety will decrease Outcome: Not Progressing Goal: Ability to modify response to factors that promote anxiety will improve Outcome: Progressing   Problem: Education: Goal: Utilization of techniques to improve thought processes will improve Outcome: Progressing Goal: Knowledge of the prescribed therapeutic regimen will improve Outcome: Progressing   Problem: Activity: Goal: Interest or engagement in leisure activities will improve Outcome: Progressing Goal: Imbalance in normal sleep/wake cycle will improve Outcome: Progressing   Problem: Coping: Goal: Coping ability will improve Outcome: Progressing Goal: Will verbalize feelings Outcome: Progressing   Problem: Health Behavior/Discharge Planning: Goal: Ability to make decisions will improve Outcome: Progressing Goal: Compliance with therapeutic regimen will improve Outcome: Progressing   Problem: Role Relationship: Goal: Will demonstrate positive changes in social behaviors and relationships Outcome: Progressing   Problem: Safety: Goal: Ability to disclose and discuss suicidal ideas will improve Outcome: Progressing Goal: Ability to identify and utilize support systems that promote safety will improve Outcome: Progressing   Problem: Self-Concept: Goal: Will verbalize positive feelings about self Outcome: Progressing Goal: Level of anxiety will decrease Outcome: Progressing   Problem: Education: Goal:  Knowledge of [facility] Education information/materials will improve Outcome: Progressing Goal: Emotional status will improve Outcome: Progressing Goal: Mental status will improve Outcome: Progressing Goal: Verbalization of understanding the information provided will improve Outcome: Progressing   Problem: Activity: Goal: Interest or engagement in activities will improve Outcome: Progressing Goal: Sleeping patterns will improve Outcome: Progressing   Problem: Coping: Goal: Ability to verbalize frustrations and anger appropriately will improve Outcome: Progressing Goal: Ability to demonstrate self-control will improve Outcome: Progressing   Problem: Health Behavior/Discharge Planning: Goal: Identification of resources available to assist in meeting health care needs will improve Outcome: Progressing Goal: Compliance with treatment plan for underlying cause of condition will improve Outcome: Progressing   Problem: Physical Regulation: Goal: Ability to maintain clinical measurements within normal limits will improve Outcome: Progressing   Problem: Safety: Goal: Periods of time without injury will increase Outcome: Progressing

## 2021-02-03 NOTE — Progress Notes (Signed)
Recreation Therapy Notes   Date: 02/03/2021  Time: 9:45am   Location: Craft room  Behavioral response: N/A   Intervention Topic: Problem-Solving   Discussion/Intervention: Patient did not attend group.   Clinical Observations/Feedback:  Patient did not attend group.     LRT/CTRS           02/03/2021 10:41 AM

## 2021-02-03 NOTE — Plan of Care (Signed)
°  Problem: Group Participation °Goal: STG - Patient will engage in groups without prompting or encouragement from LRT x3 group sessions within 5 recreation therapy group sessions °Description: STG - Patient will engage in groups without prompting or encouragement from LRT x3 group sessions within 5 recreation therapy group sessions °Outcome: Progressing °  °
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
## 2021-02-03 NOTE — Group Note (Signed)
[facility] LCSW Group Therapy Note   Group Date: 02/03/2021 Start Time: 1300 End Time: 1400  Type of Therapy/Topic:  Group Therapy:  Feelings about Diagnosis  Participation Level:  Did Not Attend   Mood: n/a   Description of Group:    This group will allow patients to explore their thoughts and feelings about diagnoses they have received. Patients will be guided to explore their level of understanding and acceptance of these diagnoses. Facilitator will encourage patients to process their thoughts and feelings about the reactions of others to their diagnosis, and will guide patients in identifying ways to discuss their diagnosis with significant others in their lives. This group will be process-oriented, with patients participating in exploration of their own experiences as well as giving and receiving support and challenge from other group members.   Therapeutic Goals: 1. Patient will demonstrate understanding of diagnosis as evidence by identifying two or more symptoms of the disorder:  2. Patient will be able to express two feelings regarding the diagnosis 3. Patient will demonstrate ability to communicate their needs through discussion and/or role plays  Summary of Patient Progress: Patient did not attend group despite encouraged participation.    Therapeutic Modalities:   Cognitive Behavioral Therapy Brief Therapy Feelings Identification     W , LCSWA 
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
## 2021-02-04 DIAGNOSIS — F250 Schizoaffective disorder, bipolar type: Secondary | ICD-10-CM | POA: Diagnosis not present

## 2021-02-04 LAB — CBC WITH DIFFERENTIAL/PLATELET
Abs Immature Granulocytes: 0.01 10*3/uL (ref 0.00–0.07)
Basophils Absolute: 0 10*3/uL (ref 0.0–0.1)
Basophils Relative: 1 %
Eosinophils Absolute: 0 10*3/uL (ref 0.0–0.5)
Eosinophils Relative: 1 %
HCT: 39.8 % (ref 39.0–52.0)
Hemoglobin: 12.9 g/dL — ABNORMAL LOW (ref 13.0–17.0)
Immature Granulocytes: 0 %
Lymphocytes Relative: 32 %
Lymphs Abs: 1.7 10*3/uL (ref 0.7–4.0)
MCH: 28 pg (ref 26.0–34.0)
MCHC: 32.4 g/dL (ref 30.0–36.0)
MCV: 86.5 fL (ref 80.0–100.0)
Monocytes Absolute: 0.7 10*3/uL (ref 0.1–1.0)
Monocytes Relative: 13 %
Neutro Abs: 2.7 10*3/uL (ref 1.7–7.7)
Neutrophils Relative %: 53 %
Platelets: 164 10*3/uL (ref 150–400)
RBC: 4.6 MIL/uL (ref 4.22–5.81)
RDW: 13.7 % (ref 11.5–15.5)
WBC: 5.2 10*3/uL (ref 4.0–10.5)
nRBC: 0 % (ref 0.0–0.2)

## 2021-02-04 LAB — LITHIUM LEVEL: Lithium Lvl: 0.46 mmol/L — ABNORMAL LOW (ref 0.60–1.20)

## 2021-02-04 NOTE — Progress Notes (Signed)
Pt took more time today in taking meds and eating a meal. He stated several times when I went to check on him that he was just depressed. Pt appeared brighter this afternoon and hummed a song as he waited on me. Pt has been withdrawn all day. Torrie Mayers RN

## 2021-02-04 NOTE — [facility] Counselor (Signed)
CSW returned call to Isac Sarna (group home coordinator at [phone]). Unable to make contact and voicemail left with contact information for follow up.   CSW received call from [employer]. Broadus John asked for an update regarding pt. CSW explained that pt was doing well but discharge date had not been set currently. No other concerns expressed. Contact ended without incident.   Vilma Meckel. Algis Greenhouse, MSW, LCSW, LCAS 02/04/2021 4:07 PM

## 2021-02-04 NOTE — Group Note (Signed)
[facility] LCSW Group Therapy Note   Group Date: 02/04/2021 Start Time: 1330 End Time: 1430   Type of Therapy/Topic:  Group Therapy:  Emotion Regulation  Participation Level:  Did Not Attend    Description of Group:    The purpose of this group is to assist patients in learning to regulate negative emotions and experience positive emotions. Patients will be guided to discuss ways in which they have been vulnerable to their negative emotions. These vulnerabilities will be juxtaposed with experiences of positive emotions or situations, and patients challenged to use positive emotions to combat negative ones. Special emphasis will be placed on coping with negative emotions in conflict situations, and patients will process healthy conflict resolution skills.  Therapeutic Goals: Patient will identify two positive emotions or experiences to reflect on in order to balance out negative emotions:  Patient will label two or more emotions that they find the most difficult to experience:  Patient will be able to demonstrate positive conflict resolution skills through discussion or role plays:   Summary of Patient Progress: X    Therapeutic Modalities:   Cognitive Behavioral Therapy Feelings Identification Dialectical Behavioral Therapy    A , LCSWA 

## 2021-02-04 NOTE — Progress Notes (Signed)
[facility] MD Progress Note  02/04/2021 1:18 PM Lawrence Morgan  MRN:  [id number]    CC: "Not great."  Subjective: 40 year old male who presented to the ED via [facility] from [facility] where he was threatening to "cut people's heads off" and throwing chairs. No acute events overnight, medication compliant, attending to ADLs. Patient seen one-on-one this morning. He reports passive suicidal ideations as well as homicidal ideations. He will not tell me who he wants to kill, but denies it is someone in the hospital. Continues to endorse visual and auditory hallucinations. Unable to contract for safety.    Principal Problem: Schizoaffective disorder, bipolar type (HCC) Diagnosis: Principal Problem:   Schizoaffective disorder, bipolar type (HCC) Active Problems:   Chronic constipation   GERD (gastroesophageal reflux disease)   Ingestion of foreign body  Total Time spent with patient: 15 minutes  Past Psychiatric History: See H&P  Past Medical History: History reviewed. No pertinent past medical history.  Past Surgical History:  Procedure Laterality Date   ESOPHAGOGASTRODUODENOSCOPY (EGD) WITH PROPOFOL N/A 01/20/2021   Procedure: ESOPHAGOGASTRODUODENOSCOPY (EGD) WITH PROPOFOL;  Surgeon: Midge Minium, MD;  Location: [facility] ENDOSCOPY;  Service: Endoscopy;  Laterality: N/A;   Family History: History reviewed. No pertinent family history. Family Psychiatric  History: See H&P Social History:  Social History   Substance and Sexual Activity  Alcohol Use Never     Social History   Substance and Sexual Activity  Drug Use Never    Social History   Socioeconomic History   Marital status: Single    Spouse name: Not on file   Number of children: Not on file   Years of education: Not on file   Highest education level: Not on file  Occupational History   Not on file  Tobacco Use   Smoking status: Every Day    Packs/day: 1.50    Types: Cigarettes   Smokeless tobacco: Never  Vaping Use   Vaping Use: Some  days   Substances: Nicotine  Substance and Sexual Activity   Alcohol use: Never   Drug use: Never   Sexual activity: Not Currently  Other Topics Concern   Not on file  Social History Narrative   Not on file   Social Determinants of Health   Financial Resource Strain: Not on file  Food Insecurity: Not on file  Transportation Needs: Not on file  Physical Activity: Not on file  Stress: Not on file  Social Connections: Not on file   Additional Social History:   Sleep: Good  Appetite:  Good  Current Medications: Current Facility-Administered Medications  Medication Dose Route Frequency Provider Last Rate Last Admin   acetaminophen (TYLENOL) tablet 650 mg  650 mg Oral Q6H PRN [name], [name], MD   650 mg at 01/29/21 0754   alum & mag hydroxide-simeth (MAALOX/MYLANTA) 200-200-20 MG/5ML suspension 30 mL  30 mL Oral Q4H PRN [name], [name], MD       ascorbic acid (VITAMIN C) tablet 500 mg  500 mg Oral Daily Les Pou M, MD   500 mg at 02/04/21 1148   benztropine (COGENTIN) tablet 0.5 mg  0.5 mg Oral BID [name], [name], MD   0.5 mg at 02/04/21 1149   cloZAPine (CLOZARIL) tablet 100 mg  100 mg Oral QHS Jesse Sans, MD   100 mg at 02/02/21 1950   docusate sodium (COLACE) capsule 200 mg  200 mg Oral BID [name], [name], MD   200 mg at 02/04/21 1148   hydrOXYzine (ATARAX/VISTARIL) tablet  50 mg  50 mg Oral Q6H PRN [name], [name], MD   50 mg at 02/02/21 0115   ibuprofen (ADVIL) tablet 600 mg  600 mg Oral Q6H PRN Jesse Sans, MD       lidocaine (LIDODERM) 5 % 1 patch  1 patch Transdermal Q24H Jesse Sans, MD   1 patch at 02/02/21 1358   lithium carbonate (LITHOBID) CR tablet 600 mg  600 mg Oral Q12H Jesse Sans, MD   600 mg at 02/04/21 1148   magnesium hydroxide (MILK OF MAGNESIA) suspension 30 mL  30 mL Oral Daily PRN [name], [name], MD       nicotine polacrilex (NICORETTE) gum 2 mg  2 mg Oral PRN [name], NP       OLANZapine (ZYPREXA) tablet 10 mg  10  mg Oral Q6H PRN Jesse Sans, MD       OLANZapine zydis (ZYPREXA) disintegrating tablet 10 mg  10 mg Oral QHS Jesse Sans, MD       oxybutynin (DITROPAN-XL) 24 hr tablet 10 mg  10 mg Oral QHS Jesse Sans, MD   10 mg at 02/02/21 2205   paliperidone (INVEGA SUSTENNA) injection 234 mg  234 mg Intramuscular Q28 days Jesse Sans, MD   234 mg at 01/30/21 1015   pantoprazole (PROTONIX) EC tablet 40 mg  40 mg Oral Daily [name], [name], MD   40 mg at 02/04/21 1149   polyethylene glycol (MIRALAX / GLYCOLAX) packet 17 g  17 g Oral Daily [name], [name], MD   17 g at 02/02/21 8144   risperiDONE (RISPERDAL) tablet 4 mg  4 mg Oral QHS Jesse Sans, MD   4 mg at 02/02/21 1949   senna (SENOKOT) tablet 8.6 mg  1 tablet Oral Daily PRN [name], NP       sertraline (ZOLOFT) tablet 100 mg  100 mg Oral Daily [name], [name], MD   100 mg at 02/04/21 1149   traZODone (DESYREL) tablet 100 mg  100 mg Oral QHS Jesse Sans, MD       traZODone (DESYREL) tablet 50 mg  50 mg Oral QHS PRN [name], NP   50 mg at 01/28/21 2128   ziprasidone (GEODON) injection 20 mg  20 mg Intramuscular Q8H PRN Jesse Sans, MD        Lab Results:  Results for orders placed or performed during the hospital encounter of 01/21/21 (from the past 48 hour(s))  CBC with Differential/Platelet     Status: Abnormal   Collection Time: 02/04/21  5:48 AM  Result Value Ref Range   WBC 5.2 4.0 - 10.5 K/uL   RBC 4.60 4.22 - 5.81 MIL/uL   Hemoglobin 12.9 (L) 13.0 - 17.0 g/dL   HCT 81.8 56.3 - 14.9 %   MCV 86.5 80.0 - 100.0 fL   MCH 28.0 26.0 - 34.0 pg   MCHC 32.4 30.0 - 36.0 g/dL   RDW 70.2 63.7 - 85.8 %   Platelets 164 150 - 400 K/uL   nRBC 0.0 0.0 - 0.2 %   Neutrophils Relative % 53 %   Neutro Abs 2.7 1.7 - 7.7 K/uL   Lymphocytes Relative 32 %   Lymphs Abs 1.7 0.7 - 4.0 K/uL   Monocytes Relative 13 %   Monocytes Absolute 0.7 0.1 - 1.0 K/uL   Eosinophils Relative 1 %   Eosinophils Absolute  0.0 0.0 - 0.5 K/uL  Basophils Relative 1 %   Basophils Absolute 0.0 0.0 - 0.1 K/uL   Immature Granulocytes 0 %   Abs Immature Granulocytes 0.01 0.00 - 0.07 K/uL    Comment: Performed at [facility], [address] [place], [place] [address]     Blood Alcohol level:  Lab Results  Component Value Date   [facility] <10 01/20/2021    Metabolic Disorder Labs: Lab Results  Component Value Date   HGBA1C 5.7 (H) 01/22/2021   MPG 116.89 01/22/2021   No results found for: PROLACTIN Lab Results  Component Value Date   CHOL 176 01/22/2021   TRIG 179 (H) 01/22/2021   HDL 47 01/22/2021   CHOLHDL 3.7 01/22/2021   VLDL 36 01/22/2021   LDLCALC 93 01/22/2021    Physical Findings: AIMS:  , ,  ,  ,    CIWA:    COWS:     Musculoskeletal: Strength & Muscle Tone: within normal limits Gait & Station: normal Patient leans: N/A  Psychiatric Specialty Exam:  Presentation  General Appearance: Appropriate for Environment  Eye Contact:Good  Speech:Clear and Coherent; Normal Rate  Speech Volume:Normal  Handedness:Right   Mood and Affect  Mood:Euthymic  Affect:Congruent   Thought Process  Thought Processes:Coherent  Descriptions of Associations:Loose  Orientation:Full (Time, Place and Person)  Thought Content:Rumination  History of Schizophrenia/Schizoaffective disorder:Yes  Duration of Psychotic Symptoms:Greater than six months  Hallucinations:No data recorded Ideas of Reference:Paranoia  Suicidal Thoughts:yes passive Homicidal Thoughts:yes active with plan  Sensorium  Memory:Immediate Good  Judgment:Impaired  Insight:Poor   Executive Functions  Concentration:Fair  Attention Span:Fair  Recall:Fair  Fund of Knowledge:Fair  Language:Fair   Psychomotor Activity  Psychomotor Activity: Decreased  Assets  Assets:Desire for Improvement; Financial Resources/Insurance; Housing; Resilience; Physical Health; Social Support   Sleep   Sleep: Good, 8 hours   Physical Exam: Physical Exam Vitals and nursing note reviewed.  HENT:     Head: Normocephalic.     Nose: No congestion or rhinorrhea.  Eyes:     General:        Right eye: No discharge.        Left eye: No discharge.  Cardiovascular:     Rate and Rhythm: Normal rate.     Pulses: Normal pulses.  Pulmonary:     Effort: Pulmonary effort is normal.  Musculoskeletal:        General: Normal range of motion.     Cervical back: Normal range of motion.  Skin:    General: Skin is warm and dry.  Neurological:     Mental Status: He is alert and oriented to person, place, and time.  Psychiatric:        Mood and Affect: Mood is anxious.        Behavior: Behavior normal. Behavior is cooperative.   Review of Systems  Gastrointestinal:  Positive for constipation (chronic, improving).  Psychiatric/Behavioral:  Positive for depression and hallucinations. The patient is nervous/anxious.   All other systems reviewed and are negative. Blood pressure 120/78, pulse 79, temperature 98.2 F (36.8 C), temperature source Oral, resp. rate 18, height 5\' 10"  (1.778 m), weight 90.7 kg, SpO2 100 %. Body mass index is 28.7 kg/m.   Treatment Plan Summary: Daily contact with patient to assess and evaluate symptoms and progress in treatment and Medication management  01/6721 Patient with diagnosis of schizoaffective disorder, bipolar type.  Patient endorsing hallucinations and homicidal idaetions.   Schizoaffective disorde, bipolar type - Continue Clozapine 100 mg oral daily at bedtime, patient  unwilling to let team increase dose - Continue benzatropine tablet 0.5 mg oral 2 times daily prophylaxis for EPS - Continue olanzapine Zydis, 10 mg oral daily at bedtime and titrate to effect - Continue Risperdal 4 mg daily at bedtime - Gean BirchwoodInvega Sustenna injection 234 mg IM given 01/30/21 - Continue sertraline tablet 100 mg oral daily - Continue lithium carbonate CR tablet 600 mg BID  (increased from 600 mg nightly with level 0.20)  Chronic constipation - Continue docusate sodium capsule 200 mg oral 2 times daily - Continue MiraLAX 17 g oral daily - Continue Senokot tablet 8.6 mg oral daily as needed for constipation  Overactive bladder - Continue Ditropan XL 24-hour tablet 10 mg p.o. at bedtime  Supplementation - Continue Vitamin C tablet 500 mg oral daily  GERD - Continue Protonix EC tablet 40 mg daily  Anxiety/agitation - Continue hydroxyzine tablet 50 mg oral every 6 hours as needed - Continue olanzapine tablet 10 mg oral every 6 hours as needed for agitation - Continue ziprasidone injection 20 mg IM every 8 hours as needed for agitation  Smoking cessation/nicotine replacement -  Continue nicotine gum 2 mg oral as needed  Insomnia -Continue trazodone 100 mg tab oral at bedtime  PRN, Other  --Continue Tylenol 650 mg po every 6 hrs prn pain --Continue MAALOX/MYLANTA 30 mL po every 4 hrs prn indigestion --Continue Milk of Magnesia 30 mL po daily prn constipation       Jesse SansMegan M , MD 02/04/2021, 1:18 PM

## 2021-02-04 NOTE — Plan of Care (Addendum)
Pt rates depression, anxiety and hopelessness all at 8/10. Pt denies SI, HI and AVH. Pt was educated on care plan and verbalizes understanding. Torrie Mayers RN Problem: Education: Goal: Ability to state activities that reduce stress will improve Outcome: Not Progressing   Problem: Coping: Goal: Ability to identify and develop effective coping behavior will improve Outcome: Not Progressing   Problem: Self-Concept: Goal: Ability to identify factors that promote anxiety will improve Outcome: Not Progressing Goal: Level of anxiety will decrease Outcome: Not Progressing Goal: Ability to modify response to factors that promote anxiety will improve Outcome: Not Progressing   Problem: Education: Goal: Utilization of techniques to improve thought processes will improve Outcome: Not Progressing Goal: Knowledge of the prescribed therapeutic regimen will improve Outcome: Not Progressing   Problem: Activity: Goal: Interest or engagement in leisure activities will improve Outcome: Not Progressing Goal: Imbalance in normal sleep/wake cycle will improve Outcome: Not Progressing   Problem: Coping: Goal: Coping ability will improve Outcome: Not Progressing Goal: Will verbalize feelings Outcome: Progressing   Problem: Health Behavior/Discharge Planning: Goal: Ability to make decisions will improve Outcome: Not Progressing Goal: Compliance with therapeutic regimen will improve Outcome: Not Progressing   Problem: Role Relationship: Goal: Will demonstrate positive changes in social behaviors and relationships Outcome: Not Progressing   Problem: Safety: Goal: Ability to disclose and discuss suicidal ideas will improve Outcome: Not Progressing Goal: Ability to identify and utilize support systems that promote safety will improve Outcome: Progressing   Problem: Self-Concept: Goal: Will verbalize positive feelings about self Outcome: Not Progressing Goal: Level of anxiety will  decrease Outcome: Not Progressing   Problem: Education: Goal: Knowledge of [place] [facility] Education information/materials will improve Outcome: Progressing Goal: Emotional status will improve Outcome: Not Progressing Goal: Mental status will improve Outcome: Not Progressing Goal: Verbalization of understanding the information provided will improve Outcome: Progressing   Problem: Activity: Goal: Interest or engagement in activities will improve Outcome: Not Progressing Goal: Sleeping patterns will improve Outcome: Not Progressing   Problem: Coping: Goal: Ability to verbalize frustrations and anger appropriately will improve Outcome: Progressing Goal: Ability to demonstrate self-control will improve Outcome: Progressing   Problem: Health Behavior/Discharge Planning: Goal: Identification of resources available to assist in meeting health care needs will improve Outcome: Progressing Goal: Compliance with treatment plan for underlying cause of condition will improve Outcome: Not Progressing   Problem: Physical Regulation: Goal: Ability to maintain clinical measurements within normal limits will improve Outcome: Progressing   Problem: Safety: Goal: Periods of time without injury will increase Outcome: Progressing

## 2021-02-04 NOTE — Progress Notes (Signed)
Patient alert and oriented x 4 affect is blunted his thoughts are organized and coherent, he appears less anxious no aggression towards peers or staff , he denies SI/HI/AVH no distress noted, 15 minutes safety checks maintained.

## 2021-02-04 NOTE — Progress Notes (Signed)
Recreation Therapy Notes   Date: 02/04/2021  Time: 9:45am   Location: Courtyard   Behavioral response: N/A   Intervention Topic: Social-Skills   Discussion/Intervention: Patient did not attend group.   Clinical Observations/Feedback:  Patient did not attend group.     LRT/CTRS            02/04/2021 11:27 AM

## 2021-02-05 DIAGNOSIS — F250 Schizoaffective disorder, bipolar type: Secondary | ICD-10-CM | POA: Diagnosis not present

## 2021-02-05 MED ORDER — OLANZAPINE 5 MG PO TBDP
15.0000 mg | ORAL_TABLET | Freq: Every day | ORAL | Status: DC
  Administered 2021-02-05 – 2021-02-13 (×9): 15 mg via ORAL
  Filled 2021-02-05 (×9): qty 3

## 2021-02-05 NOTE — Progress Notes (Signed)
[facility] MD Progress Note  02/05/2021 11:08 AM Lawrence Morgan  MRN:  [id number]    CC: "Okay"  Subjective: 40 year old male who presented to the ED via [facility] from [facility] where he was threatening to "cut people's heads off" and throwing chairs. No acute events overnight, medication compliant. Patient seen one-on-one today. He continues to report auditory hallucinations and visual hallucinations of blood on the wall. He also continues to have passive SI and passive HI but feels they are improving. He is agreeable to increase in Olanzapine.     Principal Problem: Schizoaffective disorder, bipolar type (HCC) Diagnosis: Principal Problem:   Schizoaffective disorder, bipolar type (HCC) Active Problems:   Chronic constipation   GERD (gastroesophageal reflux disease)   Ingestion of foreign body  Total Time spent with patient: 15 minutes  Past Psychiatric History: See H&P  Past Medical History: History reviewed. No pertinent past medical history.  Past Surgical History:  Procedure Laterality Date   ESOPHAGOGASTRODUODENOSCOPY (EGD) WITH PROPOFOL N/A 01/20/2021   Procedure: ESOPHAGOGASTRODUODENOSCOPY (EGD) WITH PROPOFOL;  Surgeon: Midge Minium, MD;  Location: [facility] ENDOSCOPY;  Service: Endoscopy;  Laterality: N/A;   Family History: History reviewed. No pertinent family history. Family Psychiatric  History: See H&P Social History:  Social History   Substance and Sexual Activity  Alcohol Use Never     Social History   Substance and Sexual Activity  Drug Use Never    Social History   Socioeconomic History   Marital status: Single    Spouse name: Not on file   Number of children: Not on file   Years of education: Not on file   Highest education level: Not on file  Occupational History   Not on file  Tobacco Use   Smoking status: Every Day    Packs/day: 1.50    Types: Cigarettes   Smokeless tobacco: Never  Vaping Use   Vaping Use: Some days   Substances: Nicotine  Substance and Sexual  Activity   Alcohol use: Never   Drug use: Never   Sexual activity: Not Currently  Other Topics Concern   Not on file  Social History Narrative   Not on file   Social Determinants of Health   Financial Resource Strain: Not on file  Food Insecurity: Not on file  Transportation Needs: Not on file  Physical Activity: Not on file  Stress: Not on file  Social Connections: Not on file   Additional Social History:   Sleep: Good  Appetite:  Good  Current Medications: Current Facility-Administered Medications  Medication Dose Route Frequency Provider Last Rate Last Admin   acetaminophen (TYLENOL) tablet 650 mg  650 mg Oral Q6H PRN [name], [name], MD   650 mg at 01/29/21 0754   alum & mag hydroxide-simeth (MAALOX/MYLANTA) 200-200-20 MG/5ML suspension 30 mL  30 mL Oral Q4H PRN [name], [name], MD       ascorbic acid (VITAMIN C) tablet 500 mg  500 mg Oral Daily Les Pou M, MD   500 mg at 02/05/21 0741   benztropine (COGENTIN) tablet 0.5 mg  0.5 mg Oral BID [name], [name], MD   0.5 mg at 02/05/21 0742   cloZAPine (CLOZARIL) tablet 100 mg  100 mg Oral QHS Jesse Sans, MD   100 mg at 02/04/21 2101   docusate sodium (COLACE) capsule 200 mg  200 mg Oral BID [name], [name], MD   200 mg at 02/05/21 0741   hydrOXYzine (ATARAX/VISTARIL) tablet 50 mg  50 mg Oral Q6H PRN  [name], [name], MD   50 mg at 02/02/21 0115   ibuprofen (ADVIL) tablet 600 mg  600 mg Oral Q6H PRN Jesse Sans, MD       lidocaine (LIDODERM) 5 % 1 patch  1 patch Transdermal Q24H Jesse Sans, MD   1 patch at 02/02/21 1358   lithium carbonate (LITHOBID) CR tablet 600 mg  600 mg Oral Q12H Jesse Sans, MD   600 mg at 02/05/21 0277   magnesium hydroxide (MILK OF MAGNESIA) suspension 30 mL  30 mL Oral Daily PRN [name], [name], MD       nicotine polacrilex (NICORETTE) gum 2 mg  2 mg Oral PRN [name], NP       OLANZapine (ZYPREXA) tablet 10 mg  10 mg Oral Q6H PRN Jesse Sans, MD        OLANZapine zydis (ZYPREXA) disintegrating tablet 10 mg  10 mg Oral QHS Jesse Sans, MD   10 mg at 02/04/21 2101   oxybutynin (DITROPAN-XL) 24 hr tablet 10 mg  10 mg Oral QHS Jesse Sans, MD   10 mg at 02/04/21 2102   paliperidone (INVEGA SUSTENNA) injection 234 mg  234 mg Intramuscular Q28 days Jesse Sans, MD   234 mg at 01/30/21 1015   pantoprazole (PROTONIX) EC tablet 40 mg  40 mg Oral Daily [name], [name], MD   40 mg at 02/05/21 0741   polyethylene glycol (MIRALAX / GLYCOLAX) packet 17 g  17 g Oral Daily [name], [name], MD   17 g at 02/05/21 0742   risperiDONE (RISPERDAL) tablet 4 mg  4 mg Oral QHS Jesse Sans, MD   4 mg at 02/04/21 2101   senna (SENOKOT) tablet 8.6 mg  1 tablet Oral Daily PRN [name], NP       sertraline (ZOLOFT) tablet 100 mg  100 mg Oral Daily [name], [name], MD   100 mg at 02/05/21 0741   traZODone (DESYREL) tablet 100 mg  100 mg Oral QHS Jesse Sans, MD   100 mg at 02/04/21 2102   traZODone (DESYREL) tablet 50 mg  50 mg Oral QHS PRN [name], NP   50 mg at 01/28/21 2128   ziprasidone (GEODON) injection 20 mg  20 mg Intramuscular Q8H PRN Jesse Sans, MD        Lab Results:  Results for orders placed or performed during the hospital encounter of 01/21/21 (from the past 48 hour(s))  CBC with Differential/Platelet     Status: Abnormal   Collection Time: 02/04/21  5:48 AM  Result Value Ref Range   WBC 5.2 4.0 - 10.5 K/uL   RBC 4.60 4.22 - 5.81 MIL/uL   Hemoglobin 12.9 (L) 13.0 - 17.0 g/dL   HCT 41.2 87.8 - 67.6 %   MCV 86.5 80.0 - 100.0 fL   MCH 28.0 26.0 - 34.0 pg   MCHC 32.4 30.0 - 36.0 g/dL   RDW 72.0 94.7 - 09.6 %   Platelets 164 150 - 400 K/uL   nRBC 0.0 0.0 - 0.2 %   Neutrophils Relative % 53 %   Neutro Abs 2.7 1.7 - 7.7 K/uL   Lymphocytes Relative 32 %   Lymphs Abs 1.7 0.7 - 4.0 K/uL   Monocytes Relative 13 %   Monocytes Absolute 0.7 0.1 - 1.0 K/uL   Eosinophils Relative 1 %   Eosinophils Absolute  0.0 0.0 - 0.5 K/uL   Basophils Relative  1 %   Basophils Absolute 0.0 0.0 - 0.1 K/uL   Immature Granulocytes 0 %   Abs Immature Granulocytes 0.01 0.00 - 0.07 K/uL    Comment: Performed at [facility], [address] [place], [place] [address]  Lithium level     Status: Abnormal   Collection Time: 02/04/21  1:39 PM  Result Value Ref Range   Lithium Lvl 0.46 (L) 0.60 - 1.20 mmol/L    Comment: Performed at [facility], [address] [place], [place] [address]     Blood Alcohol level:  Lab Results  Component Value Date   [facility] <10 01/20/2021    Metabolic Disorder Labs: Lab Results  Component Value Date   HGBA1C 5.7 (H) 01/22/2021   MPG 116.89 01/22/2021   No results found for: PROLACTIN Lab Results  Component Value Date   CHOL 176 01/22/2021   TRIG 179 (H) 01/22/2021   HDL 47 01/22/2021   CHOLHDL 3.7 01/22/2021   VLDL 36 01/22/2021   LDLCALC 93 01/22/2021    Physical Findings: AIMS:  , ,  ,  ,    CIWA:    COWS:     Musculoskeletal: Strength & Muscle Tone: within normal limits Gait & Station: normal Patient leans: N/A  Psychiatric Specialty Exam:  Presentation  General Appearance: Appropriate for Environment  Eye Contact:Good  Speech:Clear and Coherent; Normal Rate  Speech Volume:Normal  Handedness:Right   Mood and Affect  Mood:Euthymic  Affect:Congruent   Thought Process  Thought Processes:Coherent  Descriptions of Associations:Loose  Orientation:Full (Time, Place and Person)  Thought Content:Rumination  History of Schizophrenia/Schizoaffective disorder:Yes  Duration of Psychotic Symptoms:Greater than six months  Hallucinations:Auditory hallucinations and visual hallucinations about blood on the walls.  Ideas of Reference:Paranoia  Suicidal Thoughts:yes passive Homicidal Thoughts:yes passive  Sensorium  Memory:Immediate Good  Judgment:Impaired  Insight:Poor   Executive Functions   Concentration:Fair  Attention Span:Fair  Recall:Fair  Fund of Knowledge:Fair  Language:Fair   Psychomotor Activity  Psychomotor Activity: Decreased  Assets  Assets:Desire for Improvement; Financial Resources/Insurance; Housing; Resilience; Physical Health; Social Support   Sleep  Sleep: Good, 8 hours   Physical Exam: Physical Exam Vitals and nursing note reviewed.  HENT:     Head: Normocephalic.     Nose: No congestion or rhinorrhea.  Eyes:     General:        Right eye: No discharge.        Left eye: No discharge.  Cardiovascular:     Rate and Rhythm: Normal rate.     Pulses: Normal pulses.  Pulmonary:     Effort: Pulmonary effort is normal.  Musculoskeletal:        General: Normal range of motion.     Cervical back: Normal range of motion.  Skin:    General: Skin is warm and dry.  Neurological:     Mental Status: He is alert and oriented to person, place, and time.  Psychiatric:        Mood and Affect: Mood is anxious.        Behavior: Behavior normal. Behavior is cooperative.   Review of Systems  Gastrointestinal:  Positive for constipation (chronic, improving).  Psychiatric/Behavioral:  Positive for depression and hallucinations. The patient is nervous/anxious.   All other systems reviewed and are negative. Blood pressure 132/63, pulse 86, temperature 98.2 F (36.8 C), temperature source Oral, resp. rate 18, height 5\' 10"  (1.778 m), weight 90.7 kg, SpO2 99 %. Body mass index is 28.7 kg/m.  Treatment Plan Summary: Daily contact with patient to assess and evaluate symptoms and progress in treatment and Medication management  02/05/21 Patient with diagnosis of schizoaffective disorder, bipolar type.  Patient endorsing hallucinations, passive SI, and passive HI  Schizoaffective disorde, bipolar type - Continue Clozapine 100 mg oral daily at bedtime, patient unwilling to let team increase dose - Continue benzatropine tablet 0.5 mg oral 2 times daily  prophylaxis for EPS - Increase olanzapine Zydis, 15 mg oral daily at bedtime and titrate to effect - Continue Risperdal 4 mg daily at bedtime - Gean BirchwoodInvega Sustenna injection 234 mg IM given 01/30/21 - Continue sertraline tablet 100 mg oral daily - Continue lithium carbonate CR tablet 600 mg BID (level 0.40)  Chronic constipation - Continue docusate sodium capsule 200 mg oral 2 times daily - Continue MiraLAX 17 g oral daily - Continue Senokot tablet 8.6 mg oral daily as needed for constipation  Overactive bladder - Continue Ditropan XL 24-hour tablet 10 mg p.o. at bedtime  Supplementation - Continue Vitamin C tablet 500 mg oral daily  GERD - Continue Protonix EC tablet 40 mg daily  Anxiety/agitation - Continue hydroxyzine tablet 50 mg oral every 6 hours as needed - Continue olanzapine tablet 10 mg oral every 6 hours as needed for agitation - Continue ziprasidone injection 20 mg IM every 8 hours as needed for agitation  Smoking cessation/nicotine replacement - Continue nicotine gum 2 mg oral as needed  Insomnia -Continue trazodone 100 mg tab oral at bedtime  PRN, Other  --Continue Tylenol 650 mg po every 6 hrs prn pain --Continue MAALOX/MYLANTA 30 mL po every 4 hrs prn indigestion --Continue Milk of Magnesia 30 mL po daily prn constipation       Jesse SansMegan M , MD 02/05/2021, 11:08 AM

## 2021-02-05 NOTE — Plan of Care (Signed)
D: Patient awake and alert. Up ambulating in hallway and present at medication room. Ate breakfast this am. Denies SI/HI/AH and depression. Reports VH. Reports seeing blood on wall and seeing papa smurf. Reports anxiety at a 5/10 and pain at a 3/10. Refuses lidocaine patch. Periods of isolation but interacts with peers and staff intermittently  A: Labs and vital signs monitored. Patient supported emotionally and encouraged to ask questions and express concerns  R: Taking medication as prescribed.  Cont  Q15 minute check for safety .    Problem: Education: Goal: Ability to state activities that reduce stress will improve Outcome: Progressing   Problem: Coping: Goal: Ability to identify and develop effective coping behavior will improve Outcome: Progressing   Problem: Self-Concept: Goal: Ability to identify factors that promote anxiety will improve Outcome: Progressing Goal: Level of anxiety will decrease Outcome: Progressing Goal: Ability to modify response to factors that promote anxiety will improve Outcome: Progressing   Problem: Education: Goal: Utilization of techniques to improve thought processes will improve Outcome: Progressing Goal: Knowledge of the prescribed therapeutic regimen will improve Outcome: Progressing   Problem: Activity: Goal: Interest or engagement in leisure activities will improve Outcome: Progressing Goal: Imbalance in normal sleep/wake cycle will improve Outcome: Progressing   Problem: Coping: Goal: Coping ability will improve Outcome: Progressing Goal: Will verbalize feelings Outcome: Progressing   Problem: Health Behavior/Discharge Planning: Goal: Ability to make decisions will improve Outcome: Progressing Goal: Compliance with therapeutic regimen will improve Outcome: Progressing   Problem: Role Relationship: Goal: Will demonstrate positive changes in social behaviors and relationships Outcome: Progressing   Problem: Education: Goal:  Knowledge of [facility] Education information/materials will improve Outcome: Progressing Goal: Emotional status will improve Outcome: Progressing Goal: Mental status will improve Outcome: Progressing Goal: Verbalization of understanding the information provided will improve Outcome: Progressing   Problem: Coping: Goal: Ability to verbalize frustrations and anger appropriately will improve Outcome: Progressing Goal: Ability to demonstrate self-control will improve Outcome: Progressing   Problem: Activity: Goal: Interest or engagement in activities will improve Outcome: Progressing Goal: Sleeping patterns will improve Outcome: Progressing   Problem: Physical Regulation: Goal: Ability to maintain clinical measurements within normal limits will improve Outcome: Progressing   Problem: Health Behavior/Discharge Planning: Goal: Identification of resources available to assist in meeting health care needs will improve Outcome: Progressing Goal: Compliance with treatment plan for underlying cause of condition will improve Outcome: Progressing   Problem: Safety: Goal: Periods of time without injury will increase Outcome: Progressing   Problem: Physical Regulation: Goal: Ability to maintain clinical measurements within normal limits will improve Outcome: Progressing

## 2021-02-05 NOTE — Plan of Care (Signed)
  Problem: Self-Concept: Goal: Level of anxiety will decrease Outcome: Progressing   Problem: Activity: Goal: Interest or engagement in leisure activities will improve Outcome: Not Progressing   Problem: Coping: Goal: Will verbalize feelings Outcome: Not Progressing

## 2021-02-05 NOTE — Progress Notes (Signed)
Patient pleasant and cooperative. Isolative to self and room last evening. Out for phone usage. Denies SI, HI, AVH.  Endorses depression and sadness. Encouragement and support provided. Safety checks maintained. Medications given as prescribed. Pt receptive and remains safe on unit with q 15 min checks.

## 2021-02-05 NOTE — Group Note (Signed)
[facility] LCSW Group Therapy Note   Group Date: 02/05/2021 Start Time: 1310 End Time: 1500   Type of Therapy/Topic:  Group Therapy:  Balance in Life  Participation Level:  None   Description of Group:    This group will address the concept of balance and how it feels and looks when one is unbalanced. Patients will be encouraged to process areas in their lives that are out of balance, and identify reasons for remaining unbalanced. Facilitators will guide patients utilizing problem- solving interventions to address and correct the stressor making their life unbalanced. Understanding and applying boundaries will be explored and addressed for obtaining  and maintaining a balanced life. Patients will be encouraged to explore ways to assertively make their unbalanced needs known to significant others in their lives, using other group members and facilitator for support and feedback.  Therapeutic Goals: Patient will identify two or more emotions or situations they have that consume much of in their lives. Patient will identify signs/triggers that life has become out of balance:  Patient will identify two ways to set boundaries in order to achieve balance in their lives:  Patient will demonstrate ability to communicate their needs through discussion and/or role plays  Summary of Patient Progress: Pt came in at the end of group. He did not offer much in the way of feedback or comments on the topic at hand.   Therapeutic Modalities:   Cognitive Behavioral Therapy Solution-Focused Therapy Assertiveness Training   Glenis Smoker, LCSW

## 2021-02-05 NOTE — Progress Notes (Signed)
   02/05/21 1430  Clinical Encounter Type  Visited With Patient  Visit Type Initial;Spiritual support;Social support  Spiritual Encounters  Spiritual Needs Other (Comment);Prayer (general support)  Chaplain Burris greeted Pt and inquired about present concerns. Chaplain provided careful listening and supportive presence as Pt described past group home situation and current goals. Luna Fuse also asked about any faith beliefs or ways of meaning making that are important. Pt requested prayer and chaplain offered prayer and offer of continued pastoral presence and support.

## 2021-02-06 NOTE — Group Note (Signed)
[facility] LCSW Group Therapy Note   Group Date: 02/06/2021 Start Time: 1300 End Time: 1400  Type of Therapy and Topic:  Group Therapy:  Feelings around Relapse and Recovery  Participation Level:  Active   Mood: Euthmic, congruent with affect.   Description of Group:    Patients in this group will discuss emotions they experience before and after a relapse. They will process how experiencing these feelings, or avoidance of experiencing them, relates to having a relapse. Facilitator will guide patients to explore emotions they have related to recovery. Patients will be encouraged to process which emotions are more powerful. They will be guided to discuss the emotional reaction significant others in their lives may have to patients' relapse or recovery. Patients will be assisted in exploring ways to respond to the emotions of others without this contributing to a relapse.  Therapeutic Goals: Patient will identify two or more emotions that lead to relapse for them:  Patient will identify two emotions that result when they relapse:  Patient will identify two emotions related to recovery:  Patient will demonstrate ability to communicate their needs through discussion and/or role plays.   Summary of Patient Progress: Patient was present for the entirety of the group session. Patient was an active listener and participated in the topic of discussion, provided helpful advice to others, and added nuance to topic of conversation. Patient became tangential at times, wondering why he could not go to [facility]. CSW discussed that preference was for patient to return to the community with support in place. . . Patient was dissatisfied with this response.    Therapeutic Modalities:   Cognitive Behavioral Therapy Solution-Focused Therapy Assertiveness Training Relapse Prevention Therapy   Corky Crafts, [place]

## 2021-02-06 NOTE — Progress Notes (Signed)
   02/06/21 1445  Clinical Encounter Type  Visited With Patient  Visit Type Follow-up;Spiritual support;Social support  Referral From Other (Comment) (rounding)  Spiritual Encounters  Spiritual Needs Other (Comment) (general support)  Chaplain Burris met with Pt outdoors during recreation. Chaplain Burris engaged Pt, offered a supportive presence, and also shared some scripture at Pt's request. The visit seemed to be welcome and appreciated.

## 2021-02-06 NOTE — Plan of Care (Signed)
Pt out in the milieu pacing at times. Pt engages in minimal interaction with peers or staff. Pt denies SI/HI/AVH.  Pt reports that his day went well, and refused to elaborate. Pt has a blank expression on his face. Pt up to the nurses station one time requesting for a bottle of body wash, which was not given, as patient reported receiving one bottle earlier in the day. Pt took his scheduled medications and returned to the day room. Pt eventually went to bed/ Q 15 minute safety checks maintained.  Problem: Education: Goal: Ability to state activities that reduce stress will improve Outcome: Progressing   Problem: Coping: Goal: Ability to identify and develop effective coping behavior will improve Outcome: Progressing   Problem: Self-Concept: Goal: Ability to identify factors that promote anxiety will improve Outcome: Progressing Goal: Level of anxiety will decrease Outcome: Progressing Goal: Ability to modify response to factors that promote anxiety will improve Outcome: Progressing   Problem: Education: Goal: Utilization of techniques to improve thought processes will improve Outcome: Progressing Goal: Knowledge of the prescribed therapeutic regimen will improve Outcome: Progressing   Problem: Activity: Goal: Interest or engagement in leisure activities will improve Outcome: Progressing Goal: Imbalance in normal sleep/wake cycle will improve Outcome: Progressing   Problem: Coping: Goal: Coping ability will improve Outcome: Progressing Goal: Will verbalize feelings Outcome: Progressing   Problem: Health Behavior/Discharge Planning: Goal: Ability to make decisions will improve Outcome: Progressing Goal: Compliance with therapeutic regimen will improve Outcome: Progressing   Problem: Role Relationship: Goal: Will demonstrate positive changes in social behaviors and relationships Outcome: Progressing   Problem: Safety: Goal: Ability to disclose and discuss suicidal ideas  will improve Outcome: Progressing Goal: Ability to identify and utilize support systems that promote safety will improve Outcome: Progressing   Problem: Self-Concept: Goal: Will verbalize positive feelings about self Outcome: Progressing Goal: Level of anxiety will decrease Outcome: Progressing   Problem: Education: Goal: Knowledge of [place] [facility] Education information/materials will improve Outcome: Progressing Goal: Emotional status will improve Outcome: Progressing Goal: Mental status will improve Outcome: Progressing Goal: Verbalization of understanding the information provided will improve Outcome: Progressing   Problem: Activity: Goal: Interest or engagement in activities will improve Outcome: Progressing Goal: Sleeping patterns will improve Outcome: Progressing   Problem: Coping: Goal: Ability to verbalize frustrations and anger appropriately will improve Outcome: Progressing Goal: Ability to demonstrate self-control will improve Outcome: Progressing   Problem: Health Behavior/Discharge Planning: Goal: Identification of resources available to assist in meeting health care needs will improve Outcome: Progressing Goal: Compliance with treatment plan for underlying cause of condition will improve Outcome: Progressing   Problem: Physical Regulation: Goal: Ability to maintain clinical measurements within normal limits will improve Outcome: Progressing   Problem: Safety: Goal: Periods of time without injury will increase Outcome: Progressing

## 2021-02-06 NOTE — Progress Notes (Signed)
[facility] MD Progress Note  02/06/2021 12:00 PM Cliff Damiani  MRN:  [id number]    CC: "I'm fine"  Subjective: 40 year old male who presented to the ED via [facility] from [facility] where he was threatening to "cut people's heads off" and throwing chairs. No acute events overnight, medication compliant. Patient seen one-on-one. He continues to have visual hallucinations and auditory hallucinations. Continues to have passive SI. Today reporting HI towards [employer]. Patient appears tired on exam, and reports difficulty falling asleep at night. Encouraged patient to stay out of bed during the day to assist with sleep.    Principal Problem: Schizoaffective disorder, bipolar type (HCC) Diagnosis: Principal Problem:   Schizoaffective disorder, bipolar type (HCC) Active Problems:   Chronic constipation   GERD (gastroesophageal reflux disease)   Ingestion of foreign body  Total Time spent with patient: 15 minutes  Past Psychiatric History: See H&P  Past Medical History: History reviewed. No pertinent past medical history.  Past Surgical History:  Procedure Laterality Date   ESOPHAGOGASTRODUODENOSCOPY (EGD) WITH PROPOFOL N/A 01/20/2021   Procedure: ESOPHAGOGASTRODUODENOSCOPY (EGD) WITH PROPOFOL;  Surgeon: Midge Minium, MD;  Location: [facility] ENDOSCOPY;  Service: Endoscopy;  Laterality: N/A;   Family History: History reviewed. No pertinent family history. Family Psychiatric  History: See H&P Social History:  Social History   Substance and Sexual Activity  Alcohol Use Never     Social History   Substance and Sexual Activity  Drug Use Never    Social History   Socioeconomic History   Marital status: Single    Spouse name: Not on file   Number of children: Not on file   Years of education: Not on file   Highest education level: Not on file  Occupational History   Not on file  Tobacco Use   Smoking status: Every Day    Packs/day: 1.50    Types: Cigarettes   Smokeless tobacco: Never  Vaping  Use   Vaping Use: Some days   Substances: Nicotine  Substance and Sexual Activity   Alcohol use: Never   Drug use: Never   Sexual activity: Not Currently  Other Topics Concern   Not on file  Social History Narrative   Not on file   Social Determinants of Health   Financial Resource Strain: Not on file  Food Insecurity: Not on file  Transportation Needs: Not on file  Physical Activity: Not on file  Stress: Not on file  Social Connections: Not on file   Additional Social History:   Sleep: Good  Appetite:  Good  Current Medications: Current Facility-Administered Medications  Medication Dose Route Frequency Provider Last Rate Last Admin   acetaminophen (TYLENOL) tablet 650 mg  650 mg Oral Q6H PRN [name], [name], MD   650 mg at 01/29/21 0754   alum & mag hydroxide-simeth (MAALOX/MYLANTA) 200-200-20 MG/5ML suspension 30 mL  30 mL Oral Q4H PRN [name], [name], MD       ascorbic acid (VITAMIN C) tablet 500 mg  500 mg Oral Daily Les Pou M, MD   500 mg at 02/06/21 0800   benztropine (COGENTIN) tablet 0.5 mg  0.5 mg Oral BID [name], [name], MD   0.5 mg at 02/06/21 0759   cloZAPine (CLOZARIL) tablet 100 mg  100 mg Oral QHS Jesse Sans, MD   100 mg at 02/05/21 2113   docusate sodium (COLACE) capsule 200 mg  200 mg Oral BID [name], [name], MD   200 mg at 02/06/21 0800   hydrOXYzine (ATARAX/VISTARIL)  tablet 50 mg  50 mg Oral Q6H PRN [name], [name], MD   50 mg at 02/02/21 0115   ibuprofen (ADVIL) tablet 600 mg  600 mg Oral Q6H PRN Jesse Sans, MD       lidocaine (LIDODERM) 5 % 1 patch  1 patch Transdermal Q24H Jesse Sans, MD   1 patch at 02/02/21 1358   lithium carbonate (LITHOBID) CR tablet 600 mg  600 mg Oral Q12H Jesse Sans, MD   600 mg at 02/06/21 0759   magnesium hydroxide (MILK OF MAGNESIA) suspension 30 mL  30 mL Oral Daily PRN [name], [name], MD       nicotine polacrilex (NICORETTE) gum 2 mg  2 mg Oral PRN [name], NP       OLANZapine  (ZYPREXA) tablet 10 mg  10 mg Oral Q6H PRN Jesse Sans, MD       OLANZapine zydis (ZYPREXA) disintegrating tablet 15 mg  15 mg Oral QHS Jesse Sans, MD   15 mg at 02/05/21 2113   oxybutynin (DITROPAN-XL) 24 hr tablet 10 mg  10 mg Oral QHS Jesse Sans, MD   10 mg at 02/05/21 2113   paliperidone (INVEGA SUSTENNA) injection 234 mg  234 mg Intramuscular Q28 days Jesse Sans, MD   234 mg at 01/30/21 1015   pantoprazole (PROTONIX) EC tablet 40 mg  40 mg Oral Daily [name], [name], MD   40 mg at 02/06/21 0800   polyethylene glycol (MIRALAX / GLYCOLAX) packet 17 g  17 g Oral Daily [name], [name], MD   17 g at 02/05/21 0742   risperiDONE (RISPERDAL) tablet 4 mg  4 mg Oral QHS Jesse Sans, MD   4 mg at 02/05/21 2114   senna (SENOKOT) tablet 8.6 mg  1 tablet Oral Daily PRN [name], NP       sertraline (ZOLOFT) tablet 100 mg  100 mg Oral Daily [name], [name], MD   100 mg at 02/06/21 0800   traZODone (DESYREL) tablet 100 mg  100 mg Oral QHS Jesse Sans, MD   100 mg at 02/05/21 2113   traZODone (DESYREL) tablet 50 mg  50 mg Oral QHS PRN [name], NP   50 mg at 01/28/21 2128   ziprasidone (GEODON) injection 20 mg  20 mg Intramuscular Q8H PRN Jesse Sans, MD        Lab Results:  Results for orders placed or performed during the hospital encounter of 01/21/21 (from the past 48 hour(s))  Lithium level     Status: Abnormal   Collection Time: 02/04/21  1:39 PM  Result Value Ref Range   Lithium Lvl 0.46 (L) 0.60 - 1.20 mmol/L    Comment: Performed at [facility], [address], [place], [place] [address]     Blood Alcohol level:  Lab Results  Component Value Date   [facility] <10 01/20/2021    Metabolic Disorder Labs: Lab Results  Component Value Date   HGBA1C 5.7 (H) 01/22/2021   MPG 116.89 01/22/2021   No results found for: PROLACTIN Lab Results  Component Value Date   CHOL 176 01/22/2021   TRIG 179 (H) 01/22/2021   HDL 47  01/22/2021   CHOLHDL 3.7 01/22/2021   VLDL 36 01/22/2021   LDLCALC 93 01/22/2021    Physical Findings: AIMS:  , ,  ,  ,    CIWA:    COWS:  Musculoskeletal: Strength & Muscle Tone: within normal limits Gait & Station: normal Patient leans: N/A  Psychiatric Specialty Exam:  Presentation  General Appearance: Appropriate for Environment  Eye Contact:Good  Speech:Clear and Coherent; Normal Rate  Speech Volume:Normal  Handedness:Right   Mood and Affect  Mood:Euthymic  Affect:Congruent   Thought Process  Thought Processes:Coherent  Descriptions of Associations:Loose  Orientation:Full (Time, Place and Person)  Thought Content:Rumination  History of Schizophrenia/Schizoaffective disorder:Yes  Duration of Psychotic Symptoms:Greater than six months  Hallucinations:Auditory hallucinations and visual hallucinations about blood on the walls.  Ideas of Reference:Paranoia  Suicidal Thoughts:yes passive Homicidal Thoughts:yes passive towards President Clinton  Sensorium  Memory:Immediate Good  Judgment:Impaired  Insight:Poor   Executive Functions  Concentration:Fair  Attention Span:Fair  Recall:Fair  Fund of Knowledge:Fair  Language:Fair   Psychomotor Activity  Psychomotor Activity: Decreased  Assets  Assets:Desire for Improvement; Financial Resources/Insurance; Housing; Resilience; Physical Health; Social Support   Sleep  Sleep: Good, 8 hours   Physical Exam: Physical Exam Vitals and nursing note reviewed.  HENT:     Head: Normocephalic.     Nose: No congestion or rhinorrhea.  Eyes:     General:        Right eye: No discharge.        Left eye: No discharge.  Cardiovascular:     Rate and Rhythm: Normal rate.     Pulses: Normal pulses.  Pulmonary:     Effort: Pulmonary effort is normal.  Musculoskeletal:        General: Normal range of motion.     Cervical back: Normal range of motion.  Skin:    General: Skin is warm and dry.   Neurological:     Mental Status: He is alert and oriented to person, place, and time.  Psychiatric:        Mood and Affect: Mood is anxious.        Behavior: Behavior normal. Behavior is cooperative.   Review of Systems  Gastrointestinal:  Positive for constipation (chronic, improving).  Psychiatric/Behavioral:  Positive for depression and hallucinations. The patient is nervous/anxious.   All other systems reviewed and are negative. Blood pressure 121/87, pulse 81, temperature 97.8 F (36.6 C), temperature source Oral, resp. rate 18, height 5\' 10"  (1.778 m), weight 90.7 kg, SpO2 100 %. Body mass index is 28.7 kg/m.   Treatment Plan Summary: Daily contact with patient to assess and evaluate symptoms and progress in treatment and Medication management  02/06/21 Patient with diagnosis of schizoaffective disorder, bipolar type.  Patient endorsing hallucinations, passive SI, and passive HI towards president clinton  Schizoaffective disorde, bipolar type - Continue Clozapine 100 mg oral daily at bedtime, patient unwilling to let team increase dose - Continue benzatropine tablet 0.5 mg oral 2 times daily prophylaxis for EPS - Continue olanzapine Zydis, 15 mg oral daily at bedtime and titrate to effect - Continue Risperdal 4 mg daily at bedtime - 04/08/21 injection 234 mg IM given 01/30/21 - Continue sertraline tablet 100 mg oral daily - Continue lithium carbonate CR tablet 600 mg BID (level 0.40)  Chronic constipation - Continue docusate sodium capsule 200 mg oral 2 times daily - Continue MiraLAX 17 g oral daily - Continue Senokot tablet 8.6 mg oral daily as needed for constipation  Overactive bladder - Continue Ditropan XL 24-hour tablet 10 mg p.o. at bedtime  Supplementation - Continue Vitamin C tablet 500 mg oral daily  GERD - Continue Protonix EC tablet 40 mg daily  Anxiety/agitation - Continue hydroxyzine tablet  50 mg oral every 6 hours as needed - Continue olanzapine  tablet 10 mg oral every 6 hours as needed for agitation - Continue ziprasidone injection 20 mg IM every 8 hours as needed for agitation  Smoking cessation/nicotine replacement - Continue nicotine gum 2 mg oral as needed  Insomnia -Continue trazodone 100 mg tab oral at bedtime  PRN, Other  --Continue Tylenol 650 mg po every 6 hrs prn pain --Continue MAALOX/MYLANTA 30 mL po every 4 hrs prn indigestion --Continue Milk of Magnesia 30 mL po daily prn constipation       Jesse SansMegan M , MD 02/06/2021, 12:00 PM

## 2021-02-06 NOTE — Plan of Care (Signed)
D: Patient alert and oriented today. Ambulating throughout the unit. Interacting with staff and peers. Denies SI/AH. But endorsees VH and HI. Reports seeing papa smurf and blood. Enforms RN he wants to hurt former [employer]. States, "Because of slavery." RN attempted to process and redirect patient but patient laughed it off. Denies pain, anxiety and depression. Participates in groups and eats all meals.   A: Labs and vital signs are monitored. Patient compliant with medication. Encouraged to express concerns and needs.  R: No needs expressed at today. Will continue Q 15 minute check for safety.   Problem: Education: Goal: Ability to state activities that reduce stress will improve Outcome: Progressing   Problem: Coping: Goal: Ability to identify and develop effective coping behavior will improve Outcome: Progressing   Problem: Self-Concept: Goal: Ability to identify factors that promote anxiety will improve Outcome: Progressing Goal: Level of anxiety will decrease Outcome: Progressing Goal: Ability to modify response to factors that promote anxiety will improve Outcome: Progressing   Problem: Education: Goal: Utilization of techniques to improve thought processes will improve Outcome: Progressing Goal: Knowledge of the prescribed therapeutic regimen will improve Outcome: Progressing   Problem: Activity: Goal: Interest or engagement in leisure activities will improve Outcome: Progressing Goal: Imbalance in normal sleep/wake cycle will improve Outcome: Progressing   Problem: Coping: Goal: Coping ability will improve Outcome: Progressing Goal: Will verbalize feelings Outcome: Progressing   Problem: Health Behavior/Discharge Planning: Goal: Ability to make decisions will improve Outcome: Progressing Goal: Compliance with therapeutic regimen will improve Outcome: Progressing   Problem: Role Relationship: Goal: Will demonstrate positive changes in social  behaviors and relationships Outcome: Progressing   Problem: Safety: Goal: Ability to disclose and discuss suicidal ideas will improve Outcome: Progressing Goal: Ability to identify and utilize support systems that promote safety will improve Outcome: Progressing   Problem: Self-Concept: Goal: Will verbalize positive feelings about self Outcome: Progressing Goal: Level of anxiety will decrease Outcome: Progressing   Problem: Activity: Goal: Interest or engagement in activities will improve Outcome: Progressing Goal: Sleeping patterns will improve Outcome: Progressing   Problem: Coping: Goal: Ability to verbalize frustrations and anger appropriately will improve Outcome: Progressing Goal: Ability to demonstrate self-control will improve Outcome: Progressing   Problem: Health Behavior/Discharge Planning: Goal: Identification of resources available to assist in meeting health care needs will improve Outcome: Progressing Goal: Compliance with treatment plan for underlying cause of condition will improve Outcome: Progressing   Problem: Physical Regulation: Goal: Ability to maintain clinical measurements within normal limits will improve Outcome: Progressing   Problem: Safety: Goal: Periods of time without injury will increase Outcome: Progressing

## 2021-02-07 ENCOUNTER — Inpatient Hospital Stay: Payer: Medicare Other

## 2021-02-07 NOTE — Progress Notes (Signed)
Per order the pt is  now on 1:1 observation, due to unsafe behavior, swallowing 2 (AA )batteries. Pt denies pain and no GI distress. Pt awake, alert and oriented x 4. 1:1 behavior tech sitter at side. He denies active suicidal thoughts, but states, he will swallow more batteries, because he can't stop. Continue to monitor with 1:1 observation.

## 2021-02-07 NOTE — Progress Notes (Addendum)
Alerted by nursing staff that patient had swallowed two AAA batteries from a television remote. Ordered KUB, and received call from radiology at 6:02PM that batteries were present over the distal stomach. Call placed to Dr. Timothy Lasso, gastroenterologist on-call at 6:04PM. He states that AA and AAA batteries are safe to let pass through the GI system. Recommends repeat KUB in 48 hours to track passage through the GI system. If remains in stomach in 48 hours can remove via endoscopy. Once past stomach, weekly KUB until passed or confirmed in stool. If battery remains in same place for a week, would also require intervention. Patient currently unable to articulate why he swallowed batteries, and is also unable to contract for safety to not swallow additional items. He has been placed on a 1:1. Call placed to unit to update charge nurse, [facility], on conversations and new orders.

## 2021-02-07 NOTE — BH IP Treatment Plan (Signed)
Interdisciplinary Treatment and Diagnostic Plan Update  02/07/2021 Time of Session: 0900 Lawrence Morgan MRN: [id number]  Principal Diagnosis: Schizoaffective disorder, bipolar type [facility])  Secondary Diagnoses: Principal Problem:   Schizoaffective disorder, bipolar type (HCC) Active Problems:   Chronic constipation   GERD (gastroesophageal reflux disease)   Ingestion of foreign body   Current Medications:  Current Facility-Administered Medications  Medication Dose Route Frequency Provider Last Rate Last Admin   acetaminophen (TYLENOL) tablet 650 mg  650 mg Oral Q6H PRN [name], [name], MD   650 mg at 01/29/21 0754   alum & mag hydroxide-simeth (MAALOX/MYLANTA) 200-200-20 MG/5ML suspension 30 mL  30 mL Oral Q4H PRN [name], [name], MD       ascorbic acid (VITAMIN C) tablet 500 mg  500 mg Oral Daily Les Pou M, MD   500 mg at 02/07/21 8563   benztropine (COGENTIN) tablet 0.5 mg  0.5 mg Oral BID [name], [name], MD   0.5 mg at 02/07/21 0811   cloZAPine (CLOZARIL) tablet 100 mg  100 mg Oral QHS Jesse Sans, MD   100 mg at 02/06/21 2107   docusate sodium (COLACE) capsule 200 mg  200 mg Oral BID [name], [name], MD   200 mg at 02/07/21 0810   hydrOXYzine (ATARAX/VISTARIL) tablet 50 mg  50 mg Oral Q6H PRN [name], [name], MD   50 mg at 02/02/21 0115   ibuprofen (ADVIL) tablet 600 mg  600 mg Oral Q6H PRN Jesse Sans, MD       lidocaine (LIDODERM) 5 % 1 patch  1 patch Transdermal Q24H Jesse Sans, MD   1 patch at 02/06/21 1306   lithium carbonate (LITHOBID) CR tablet 600 mg  600 mg Oral Q12H Jesse Sans, MD   600 mg at 02/07/21 0809   magnesium hydroxide (MILK OF MAGNESIA) suspension 30 mL  30 mL Oral Daily PRN [name], [name], MD       nicotine polacrilex (NICORETTE) gum 2 mg  2 mg Oral PRN Gabriel Cirri F, NP   2 mg at 02/06/21 1408   OLANZapine (ZYPREXA) tablet 10 mg  10 mg Oral Q6H PRN Jesse Sans, MD       OLANZapine zydis (ZYPREXA) disintegrating tablet 15  mg  15 mg Oral QHS Jesse Sans, MD   15 mg at 02/06/21 2106   oxybutynin (DITROPAN-XL) 24 hr tablet 10 mg  10 mg Oral QHS Jesse Sans, MD   10 mg at 02/06/21 2106   paliperidone (INVEGA SUSTENNA) injection 234 mg  234 mg Intramuscular Q28 days Jesse Sans, MD   234 mg at 01/30/21 1015   pantoprazole (PROTONIX) EC tablet 40 mg  40 mg Oral Daily [name], [name], MD   40 mg at 02/07/21 0810   polyethylene glycol (MIRALAX / GLYCOLAX) packet 17 g  17 g Oral Daily [name], [name], MD   17 g at 02/05/21 0742   risperiDONE (RISPERDAL) tablet 4 mg  4 mg Oral QHS Jesse Sans, MD   4 mg at 02/06/21 2106   senna (SENOKOT) tablet 8.6 mg  1 tablet Oral Daily PRN Gabriel Cirri F, NP       sertraline (ZOLOFT) tablet 100 mg  100 mg Oral Daily [name], [name], MD   100 mg at 02/07/21 0810   traZODone (DESYREL) tablet 100 mg  100 mg Oral QHS Jesse Sans, MD   100 mg at 02/06/21 2107   traZODone (DESYREL)  tablet 50 mg  50 mg Oral QHS PRN [name], NP   50 mg at 01/28/21 2128   ziprasidone (GEODON) injection 20 mg  20 mg Intramuscular Q8H PRN Jesse Sans, MD       PTA Medications: Medications Prior to Admission  Medication Sig Dispense Refill Last Dose   benztropine (COGENTIN) 1 MG tablet Take 1 mg by mouth 2 (two) times daily.      cloZAPine (CLOZARIL) 100 MG tablet Take 500 mg by mouth at bedtime.      famotidine (PEPCID) 20 MG tablet Take 20 mg by mouth daily.      fluvoxaMINE (LUVOX) 50 MG tablet Take 50 mg by mouth at bedtime.      lithium 300 MG tablet Take 600 mg by mouth 2 (two) times daily.      OLANZapine (ZYPREXA) 15 MG tablet Take 15 mg by mouth at bedtime.      oxybutynin (DITROPAN-XL) 10 MG 24 hr tablet Take 10 mg by mouth daily.       Patient Stressors: Health problems Other: SI/HI/AVH  Patient Strengths: Barrister's clerk for treatment/growth  Treatment Modalities: Medication Management, Group therapy, Case management,  1 to 1 session  with clinician, Psychoeducation, Recreational therapy.   Physician Treatment Plan for Primary Diagnosis: Schizoaffective disorder, bipolar type (HCC) Long Term Goal(s): Improvement in symptoms so as ready for discharge   Short Term Goals: Ability to identify changes in lifestyle to reduce recurrence of condition will improve Ability to disclose and discuss suicidal ideas Ability to demonstrate self-control will improve Ability to identify and develop effective coping behaviors will improve Ability to maintain clinical measurements within normal limits will improve Compliance with prescribed medications will improve  Medication Management: Evaluate patient's response, side effects, and tolerance of medication regimen.  Therapeutic Interventions: 1 to 1 sessions, Unit Group sessions and Medication administration.  Evaluation of Outcomes: Progressing  Physician Treatment Plan for Secondary Diagnosis: Principal Problem:   Schizoaffective disorder, bipolar type (HCC) Active Problems:   Chronic constipation   GERD (gastroesophageal reflux disease)   Ingestion of foreign body  Long Term Goal(s): Improvement in symptoms so as ready for discharge   Short Term Goals: Ability to identify changes in lifestyle to reduce recurrence of condition will improve Ability to disclose and discuss suicidal ideas Ability to demonstrate self-control will improve Ability to identify and develop effective coping behaviors will improve Ability to maintain clinical measurements within normal limits will improve Compliance with prescribed medications will improve     Medication Management: Evaluate patient's response, side effects, and tolerance of medication regimen.  Therapeutic Interventions: 1 to 1 sessions, Unit Group sessions and Medication administration.  Evaluation of Outcomes: Progressing   RN Treatment Plan for Primary Diagnosis: Schizoaffective disorder, bipolar type (HCC) Long Term Goal(s):  Knowledge of disease and therapeutic regimen to maintain health will improve  Short Term Goals: Ability to remain free from injury will improve, Ability to verbalize frustration and anger appropriately will improve, Ability to demonstrate self-control, Ability to participate in decision making will improve, Ability to verbalize feelings will improve, Ability to disclose and discuss suicidal ideas, Ability to identify and develop effective coping behaviors will improve, and Compliance with prescribed medications will improve  Medication Management: RN will administer medications as ordered by provider, will assess and evaluate patient's response and provide education to patient for prescribed medication. RN will report any adverse and/or side effects to prescribing provider.  Therapeutic Interventions: 1 on 1 counseling sessions, Psychoeducation,  Medication administration, Evaluate responses to treatment, Monitor vital signs and CBGs as ordered, Perform/monitor CIWA, COWS, AIMS and Fall Risk screenings as ordered, Perform wound care treatments as ordered.  Evaluation of Outcomes: Progressing   LCSW Treatment Plan for Primary Diagnosis: Schizoaffective disorder, bipolar type (HCC) Long Term Goal(s): Safe transition to appropriate next level of care at discharge, Engage patient in therapeutic group addressing interpersonal concerns.  Short Term Goals: Engage patient in aftercare planning with referrals and resources, Increase social support, Increase ability to appropriately verbalize feelings, Increase emotional regulation, Facilitate acceptance of mental health diagnosis and concerns, Facilitate patient progression through stages of change regarding substance use diagnoses and concerns, Identify triggers associated with mental health/substance abuse issues, and Increase skills for wellness and recovery  Therapeutic Interventions: Assess for all discharge needs, 1 to 1 time with Social worker, Explore  available resources and support systems, Assess for adequacy in community support network, Educate family and significant other(s) on suicide prevention, Complete Psychosocial Assessment, Interpersonal group therapy.  Evaluation of Outcomes: Progressing   Progress in Treatment: Attending groups: Yes. Participating in groups: Yes. Taking medication as prescribed: Yes. Toleration medication: Yes. Family/Significant other contact made: Yes, individual(s) contacted:  SPE completed with patient and collateral contact.  Patient understands diagnosis: No. Discussing patient identified problems/goals with staff: Yes. Medical problems stabilized or resolved: Yes. Denies suicidal/homicidal ideation: No. Issues/concerns per patient self-inventory: Yes. Other: none   New problem(s) identified: No, Describe:  No additional problems identified.   New Short Term/Long Term Goal(s): Continue to work toward elimination of symptoms of psychosis, medication management for mood stabilization; elimination of SI thoughts; development of comprehensive mental wellness/sobriety plan.  Patient Goals:  No additional goals identified since last update. Patient continues to be preoccupied on getting into [facility]. CSW discussed with patient he is not appropriate for [facility]; redirected patient to returning to community.    Discharge Plan or Barriers: No additional barriers identified since last update.   Reason for Continuation of Hospitalization: Delusions  Homicidal ideation Suicidal ideation  Estimated Length of Stay: 1-7 days    Scribe for Treatment Team: Almedia Balls 02/07/2021 9:45 AM

## 2021-02-07 NOTE — Plan of Care (Signed)
  Problem: Education: Goal: Ability to state activities that reduce stress will improve Outcome: Progressing   Problem: Coping: Goal: Ability to identify and develop effective coping behavior will improve Outcome: Progressing   Problem: Self-Concept: Goal: Ability to identify factors that promote anxiety will improve Outcome: Progressing Goal: Level of anxiety will decrease Outcome: Progressing Goal: Ability to modify response to factors that promote anxiety will improve Outcome: Progressing   Problem: Education: Goal: Utilization of techniques to improve thought processes will improve Outcome: Progressing Goal: Knowledge of the prescribed therapeutic regimen will improve Outcome: Progressing   Problem: Activity: Goal: Interest or engagement in leisure activities will improve Outcome: Progressing Goal: Imbalance in normal sleep/wake cycle will improve Outcome: Progressing   Problem: Coping: Goal: Coping ability will improve Outcome: Progressing Goal: Will verbalize feelings Outcome: Progressing   Problem: Health Behavior/Discharge Planning: Goal: Ability to make decisions will improve Outcome: Progressing Goal: Compliance with therapeutic regimen will improve Outcome: Progressing   Problem: Role Relationship: Goal: Will demonstrate positive changes in social behaviors and relationships Outcome: Progressing   Problem: Safety: Goal: Ability to disclose and discuss suicidal ideas will improve Outcome: Progressing Goal: Ability to identify and utilize support systems that promote safety will improve Outcome: Progressing   Problem: Self-Concept: Goal: Will verbalize positive feelings about self Outcome: Progressing Goal: Level of anxiety will decrease Outcome: Progressing   Problem: Education: Goal: Knowledge of  General Education information/materials will improve Outcome: Progressing Goal: Emotional status will improve Outcome: Progressing Goal:  Mental status will improve Outcome: Progressing Goal: Verbalization of understanding the information provided will improve Outcome: Progressing   Problem: Activity: Goal: Interest or engagement in activities will improve Outcome: Progressing Goal: Sleeping patterns will improve Outcome: Progressing   Problem: Coping: Goal: Ability to verbalize frustrations and anger appropriately will improve Outcome: Progressing Goal: Ability to demonstrate self-control will improve Outcome: Progressing   Problem: Health Behavior/Discharge Planning: Goal: Identification of resources available to assist in meeting health care needs will improve Outcome: Progressing Goal: Compliance with treatment plan for underlying cause of condition will improve Outcome: Progressing   Problem: Physical Regulation: Goal: Ability to maintain clinical measurements within normal limits will improve Outcome: Progressing   Problem: Safety: Goal: Periods of time without injury will increase Outcome: Progressing   

## 2021-02-07 NOTE — Progress Notes (Signed)
Per report by the behavioral tech the pt swallowed 2 (AAA )batteries. Vital signs stable  temp 98.7, pulse 94, res-20, oxygen sat, 100 BP 124/83. Pt alert and orient, with no signs of distress. No complaints of pain or GI discomfort. Doctor Neale Burly notified, x-ray ordered and completed. Per xray, the batteries were seen on xray. I asked  the patient about the batteries and stated, I swallowed the batteries I don't know why."  He stated I guess I want to kill myself, but I have swallowed about 150 batteries." He denies HI, or AV hallucinations.

## 2021-02-07 NOTE — Plan of Care (Signed)
  Problem: Education: Goal: Ability to state activities that reduce stress will improve Outcome: Progressing   Problem: Coping: Goal: Ability to identify and develop effective coping behavior will improve Outcome: Progressing   Problem: Self-Concept: Goal: Ability to identify factors that promote anxiety will improve Outcome: Progressing Goal: Level of anxiety will decrease Outcome: Progressing Goal: Ability to modify response to factors that promote anxiety will improve Outcome: Progressing   Problem: Education: Goal: Utilization of techniques to improve thought processes will improve Outcome: Progressing Goal: Knowledge of the prescribed therapeutic regimen will improve Outcome: Progressing   Problem: Activity: Goal: Interest or engagement in leisure activities will improve Outcome: Progressing Goal: Imbalance in normal sleep/wake cycle will improve Outcome: Progressing   Problem: Coping: Goal: Coping ability will improve Outcome: Progressing Goal: Will verbalize feelings Outcome: Progressing   Problem: Health Behavior/Discharge Planning: Goal: Ability to make decisions will improve Outcome: Progressing Goal: Compliance with therapeutic regimen will improve Outcome: Progressing   Problem: Role Relationship: Goal: Will demonstrate positive changes in social behaviors and relationships Outcome: Progressing   Problem: Safety: Goal: Ability to disclose and discuss suicidal ideas will improve Outcome: Progressing Goal: Ability to identify and utilize support systems that promote safety will improve Outcome: Progressing   Problem: Self-Concept: Goal: Will verbalize positive feelings about self Outcome: Progressing Goal: Level of anxiety will decrease Outcome: Progressing   Problem: Education: Goal: Knowledge of [place] [facility] Education information/materials will improve Outcome: Progressing Goal: Emotional status will improve Outcome: Progressing Goal:  Mental status will improve Outcome: Progressing Goal: Verbalization of understanding the information provided will improve Outcome: Progressing   Problem: Activity: Goal: Interest or engagement in activities will improve Outcome: Progressing Goal: Sleeping patterns will improve Outcome: Progressing   Problem: Coping: Goal: Ability to verbalize frustrations and anger appropriately will improve Outcome: Progressing Goal: Ability to demonstrate self-control will improve Outcome: Progressing   Problem: Health Behavior/Discharge Planning: Goal: Identification of resources available to assist in meeting health care needs will improve Outcome: Progressing Goal: Compliance with treatment plan for underlying cause of condition will improve Outcome: Progressing   Problem: Physical Regulation: Goal: Ability to maintain clinical measurements within normal limits will improve Outcome: Progressing   Problem: Safety: Goal: Periods of time without injury will increase Outcome: Progressing   

## 2021-02-07 NOTE — Progress Notes (Signed)
[facility] MD Progress Note  02/07/2021 10:05 AM Lawrence Morgan  MRN:  [id number]    CC: "Coming along"  Subjective: 40 year old male who presented to the ED via [facility] from [facility] where he was threatening to "cut people's heads off" and throwing chairs. No acute events overnight, medication compliant. Patient seen one-on-one. Patient states that he is "coming along." He continues to endorse auditory hallucinations, visual hallucinations, and passive HI but feels that they are all decreasing. His main complaint today is gas and acid reflux. He denies constipation and states his last bowel movement was overnight.    Principal Problem: Schizoaffective disorder, bipolar type (HCC) Diagnosis: Principal Problem:   Schizoaffective disorder, bipolar type (HCC) Active Problems:   Chronic constipation   GERD (gastroesophageal reflux disease)   Ingestion of foreign body  Total Time spent with patient: 15 minutes  Past Psychiatric History: See H&P  Past Medical History: History reviewed. No pertinent past medical history.  Past Surgical History:  Procedure Laterality Date   ESOPHAGOGASTRODUODENOSCOPY (EGD) WITH PROPOFOL N/A 01/20/2021   Procedure: ESOPHAGOGASTRODUODENOSCOPY (EGD) WITH PROPOFOL;  Surgeon: Midge Minium, MD;  Location: [facility] ENDOSCOPY;  Service: Endoscopy;  Laterality: N/A;   Family History: History reviewed. No pertinent family history. Family Psychiatric  History: See H&P Social History:  Social History   Substance and Sexual Activity  Alcohol Use Never     Social History   Substance and Sexual Activity  Drug Use Never    Social History   Socioeconomic History   Marital status: Single    Spouse name: Not on file   Number of children: Not on file   Years of education: Not on file   Highest education level: Not on file  Occupational History   Not on file  Tobacco Use   Smoking status: Every Day    Packs/day: 1.50    Types: Cigarettes   Smokeless tobacco: Never  Vaping Use    Vaping Use: Some days   Substances: Nicotine  Substance and Sexual Activity   Alcohol use: Never   Drug use: Never   Sexual activity: Not Currently  Other Topics Concern   Not on file  Social History Narrative   Not on file   Social Determinants of Health   Financial Resource Strain: Not on file  Food Insecurity: Not on file  Transportation Needs: Not on file  Physical Activity: Not on file  Stress: Not on file  Social Connections: Not on file   Additional Social History:   Sleep: Good  Appetite:  Good  Current Medications: Current Facility-Administered Medications  Medication Dose Route Frequency Provider Last Rate Last Admin   acetaminophen (TYLENOL) tablet 650 mg  650 mg Oral Q6H PRN [name], [name], MD   650 mg at 01/29/21 0754   alum & mag hydroxide-simeth (MAALOX/MYLANTA) 200-200-20 MG/5ML suspension 30 mL  30 mL Oral Q4H PRN [name], [name], MD       ascorbic acid (VITAMIN C) tablet 500 mg  500 mg Oral Daily Les Pou M, MD   500 mg at 02/07/21 0932   benztropine (COGENTIN) tablet 0.5 mg  0.5 mg Oral BID [name], [name], MD   0.5 mg at 02/07/21 0811   cloZAPine (CLOZARIL) tablet 100 mg  100 mg Oral QHS Jesse Sans, MD   100 mg at 02/06/21 2107   docusate sodium (COLACE) capsule 200 mg  200 mg Oral BID [name], [name], MD   200 mg at 02/07/21 0810   hydrOXYzine (ATARAX/VISTARIL) tablet  50 mg  50 mg Oral Q6H PRN [name], [name], MD   50 mg at 02/02/21 0115   ibuprofen (ADVIL) tablet 600 mg  600 mg Oral Q6H PRN Jesse Sans, MD       lidocaine (LIDODERM) 5 % 1 patch  1 patch Transdermal Q24H Jesse Sans, MD   1 patch at 02/06/21 1306   lithium carbonate (LITHOBID) CR tablet 600 mg  600 mg Oral Q12H Jesse Sans, MD   600 mg at 02/07/21 0809   magnesium hydroxide (MILK OF MAGNESIA) suspension 30 mL  30 mL Oral Daily PRN [name], [name], MD       nicotine polacrilex (NICORETTE) gum 2 mg  2 mg Oral PRN Gabriel Cirri F, NP   2 mg at 02/06/21 1408    OLANZapine (ZYPREXA) tablet 10 mg  10 mg Oral Q6H PRN Jesse Sans, MD       OLANZapine zydis (ZYPREXA) disintegrating tablet 15 mg  15 mg Oral QHS Jesse Sans, MD   15 mg at 02/06/21 2106   oxybutynin (DITROPAN-XL) 24 hr tablet 10 mg  10 mg Oral QHS Jesse Sans, MD   10 mg at 02/06/21 2106   paliperidone (INVEGA SUSTENNA) injection 234 mg  234 mg Intramuscular Q28 days Jesse Sans, MD   234 mg at 01/30/21 1015   pantoprazole (PROTONIX) EC tablet 40 mg  40 mg Oral Daily [name], [name], MD   40 mg at 02/07/21 0810   polyethylene glycol (MIRALAX / GLYCOLAX) packet 17 g  17 g Oral Daily [name], [name], MD   17 g at 02/05/21 0742   risperiDONE (RISPERDAL) tablet 4 mg  4 mg Oral QHS Jesse Sans, MD   4 mg at 02/06/21 2106   senna (SENOKOT) tablet 8.6 mg  1 tablet Oral Daily PRN [name], NP       sertraline (ZOLOFT) tablet 100 mg  100 mg Oral Daily [name], [name], MD   100 mg at 02/07/21 0810   traZODone (DESYREL) tablet 100 mg  100 mg Oral QHS Jesse Sans, MD   100 mg at 02/06/21 2107   traZODone (DESYREL) tablet 50 mg  50 mg Oral QHS PRN [name], NP   50 mg at 01/28/21 2128   ziprasidone (GEODON) injection 20 mg  20 mg Intramuscular Q8H PRN Jesse Sans, MD        Lab Results:  No results found for this or any previous visit (from the past 48 hour(s)).    Blood Alcohol level:  Lab Results  Component Value Date   ETH <10 01/20/2021    Metabolic Disorder Labs: Lab Results  Component Value Date   HGBA1C 5.7 (H) 01/22/2021   MPG 116.89 01/22/2021   No results found for: PROLACTIN Lab Results  Component Value Date   CHOL 176 01/22/2021   TRIG 179 (H) 01/22/2021   HDL 47 01/22/2021   CHOLHDL 3.7 01/22/2021   VLDL 36 01/22/2021   LDLCALC 93 01/22/2021    Physical Findings: AIMS:  , ,  ,  ,    CIWA:    COWS:     Musculoskeletal: Strength & Muscle Tone: within normal limits Gait & Station: normal Patient leans:  N/A  Psychiatric Specialty Exam:  Presentation  General Appearance: Appropriate for Environment  Eye Contact:Good  Speech:Clear and Coherent; Normal Rate  Speech Volume:Normal  Handedness:Right   Mood and Affect  Mood:Euthymic  Affect:Congruent  Thought Process  Thought Processes:Coherent  Descriptions of Associations:Loose  Orientation:Full (Time, Place and Person)  Thought Content:Rumination  History of Schizophrenia/Schizoaffective disorder:Yes  Duration of Psychotic Symptoms:Greater than six months  Hallucinations:Auditory hallucinations and visual hallucinations about blood on the walls.  Ideas of Reference:Paranoia  Suicidal Thoughts:yes passive Homicidal Thoughts:yes passive no target today  Sensorium  Memory:Immediate Good  Judgment:Impaired  Insight:Poor   Executive Functions  Concentration:Fair  Attention Span:Fair  Recall:Fair  Fund of Knowledge:Fair  Language:Fair   Psychomotor Activity  Psychomotor Activity: Decreased  Assets  Assets:Desire for Improvement; Financial Resources/Insurance; Housing; Resilience; Physical Health; Social Support   Sleep  Sleep: Good, 8 hours    Blood pressure 115/82, pulse 84, temperature 98.4 F (36.9 C), temperature source Oral, resp. rate 18, height 5\' 10"  (1.778 m), weight 90.7 kg, SpO2 100 %. Body mass index is 28.7 kg/m.   Treatment Plan Summary: Daily contact with patient to assess and evaluate symptoms and progress in treatment and Medication management  02/07/21 Patient with diagnosis of schizoaffective disorder, bipolar type.  Patient endorsing hallucinations and passive HI without target. He feels they are all improving today.   Schizoaffective disorde, bipolar type - Continue Clozapine 100 mg oral daily at bedtime, patient unwilling to let team increase dose - Continue benzatropine tablet 0.5 mg oral 2 times daily prophylaxis for EPS - Continue olanzapine Zydis, 15 mg oral  daily at bedtime and titrate to effect - Continue Risperdal 4 mg daily at bedtime - 04/09/21 injection 234 mg IM given 01/30/21 - Continue sertraline tablet 100 mg oral daily - Continue lithium carbonate CR tablet 600 mg BID (level 0.40)  Chronic constipation - Continue docusate sodium capsule 200 mg oral 2 times daily - Continue MiraLAX 17 g oral daily - Continue Senokot tablet 8.6 mg oral daily as needed for constipation  Overactive bladder - Continue Ditropan XL 24-hour tablet 10 mg p.o. at bedtime  Supplementation - Continue Vitamin C tablet 500 mg oral daily  GERD - Continue Protonix EC tablet 40 mg daily  Anxiety/agitation - Continue hydroxyzine tablet 50 mg oral every 6 hours as needed - Continue olanzapine tablet 10 mg oral every 6 hours as needed for agitation - Continue ziprasidone injection 20 mg IM every 8 hours as needed for agitation  Smoking cessation/nicotine replacement - Continue nicotine gum 2 mg oral as needed  Insomnia -Continue trazodone 100 mg tab oral at bedtime  PRN, Other  --Continue Tylenol 650 mg po every 6 hrs prn pain --Continue MAALOX/MYLANTA 30 mL po every 4 hrs prn indigestion --Continue Milk of Magnesia 30 mL po daily prn constipation       04/01/21, MD 02/07/2021, 10:05 AM

## 2021-02-07 NOTE — Progress Notes (Signed)
Patient has been behaving appropriately. Not making as many bizarre statements. Reminiscing about the different psychiatric facilities he has been in. Denies SI, HI and AVH

## 2021-02-07 NOTE — Group Note (Signed)
LCSW Group Therapy Note  Group Date: 02/07/2021 Start Time: 1300 End Time: 1400   Type of Therapy and Topic:  Group Therapy - How To Cope with Nervousness about Discharge   Participation Level:  Active   Description of Group This process group involved identification of patients' feelings about discharge. Some of them are scheduled to be discharged soon, while others are new admissions, but each of them was asked to share thoughts and feelings surrounding discharge from the hospital. One common theme was that they are excited at the prospect of going home, while another was that many of them are apprehensive about sharing why they were hospitalized. Patients were given the opportunity to discuss these feelings with their peers in preparation for discharge.  Therapeutic Goals  Patient will identify their overall feelings about pending discharge. Patient will think about how they might proactively address issues that they believe will once again arise once they get home (i.e. with parents). Patients will participate in discussion about having hope for change.   Summary of Patient Progress:  Lawrence Morgan was active throughout the session. Demonstrated  insight into the subject matter, and proved open to input from peers and feedback from CSW. Pt was respectful of peers and participated throughout the entire session. Pt shared little related to topic of discussion, though was cordial with others in group.    Therapeutic Modalities Cognitive Behavioral Therapy   Almedia Balls 02/07/2021  3:56 PM

## 2021-02-08 MED ORDER — LITHIUM CARBONATE ER 300 MG PO TBCR
600.0000 mg | EXTENDED_RELEASE_TABLET | Freq: Every morning | ORAL | Status: DC
  Administered 2021-02-09 – 2021-02-17 (×9): 600 mg via ORAL
  Filled 2021-02-08 (×11): qty 2

## 2021-02-08 MED ORDER — LITHIUM CARBONATE ER 450 MG PO TBCR
900.0000 mg | EXTENDED_RELEASE_TABLET | Freq: Every day | ORAL | Status: DC
  Administered 2021-02-08 – 2021-02-16 (×9): 900 mg via ORAL
  Filled 2021-02-08 (×9): qty 2

## 2021-02-08 NOTE — Progress Notes (Signed)
Patient remains 1:1, no distress noted. Pleasant and cooperative. Medication compliant. Appropriate with staff and peers. Denies Si, HI, AVH.

## 2021-02-08 NOTE — Progress Notes (Signed)
Patient in bed resting, eyes closed in no distress. Remains with sitter for safety.  Came out for lunch today.

## 2021-02-08 NOTE — Progress Notes (Addendum)
1900-2300 1:1 obs Patient has been silly and superficial, laughing and joking with same age peers. Denies SI, HI and AVH but says he has urges to swallow objects that are not food. Sitter within arm's lenght 2300--0200 Resting in bed with eyes closed. Sitter within arm's length 0200-0500 Resting in bed with eyes closed.Safety maintained 0500-0700 Resting in bed with eyes closed. Safety maintained

## 2021-02-08 NOTE — Plan of Care (Signed)
  Problem: Education: Goal: Ability to state activities that reduce stress will improve Outcome: Progressing   Problem: Coping: Goal: Ability to identify and develop effective coping behavior will improve Outcome: Progressing   Problem: Self-Concept: Goal: Level of anxiety will decrease Outcome: Progressing Goal: Ability to modify response to factors that promote anxiety will improve Outcome: Progressing   Problem: Education: Goal: Utilization of techniques to improve thought processes will improve Outcome: Not Progressing Goal: Knowledge of the prescribed therapeutic regimen will improve Outcome: Not Progressing   Problem: Activity: Goal: Interest or engagement in leisure activities will improve Outcome: Not Progressing   Problem: Coping: Goal: Coping ability will improve Outcome: Progressing

## 2021-02-08 NOTE — Progress Notes (Signed)
Patient remains with 1:1 for safety. Patient pleasant and cooperative. Reports being tired this am. Medication compliant. Sleeping most of morning. No complaints voiced at this time. Denies any SI, HI, AVH. Denies an urge to swallow objects at this time. Encouragement and support provided. Safety checks maintained, medications given as prescribed. Pt receptive and remains safe on unit with sitter and 1:1 safety checks.

## 2021-02-08 NOTE — [facility] Group Notes (Signed)
[facility] LCSW Group Therapy Note  Date/Time:  02/08/2021 1:07 PM- 2:00 PM   Type of Therapy and Topic:  Group Therapy:  Healthy and Unhealthy Supports  Participation Level:  Minimal   Description of Group:  Patients in this group were introduced to the idea of adding a variety of healthy supports to address the various needs in their lives.Patients discussed what additional healthy supports could be helpful in their recovery and wellness after discharge in order to prevent future hospitalizations.   An emphasis was placed on using counselor, doctor, therapy groups, 12-step groups, and problem-specific support groups to expand supports.  They also worked as a group on developing a specific plan for several patients to deal with unhealthy supports through boundary-setting, psychoeducation with loved ones, and even termination of relationships.   Therapeutic Goals:   1)  discuss importance of adding supports to stay well once out of the hospital  2)  compare healthy versus unhealthy supports and identify some examples of each  3)  generate ideas and descriptions of healthy supports that can be added  4)  offer mutual support about how to address unhealthy supports  5)  encourage active participation in and adherence to discharge plan    Summary of Patient Progress:  Patient came towards the end of group.   Therapeutic Modalities:   Motivational Interviewing Brief Solution-Focused Therapy  Susa Simmonds, Theresia Majors 02/08/2021  3:42 PM

## 2021-02-08 NOTE — Plan of Care (Signed)
  Problem: Education: Goal: Ability to state activities that reduce stress will improve Outcome: Not Progressing   Problem: Coping: Goal: Ability to identify and develop effective coping behavior will improve Outcome: Not Progressing   Problem: Self-Concept: Goal: Ability to identify factors that promote anxiety will improve Outcome: Not Progressing Goal: Level of anxiety will decrease Outcome: Not Progressing Goal: Ability to modify response to factors that promote anxiety will improve Outcome: Not Progressing   Problem: Education: Goal: Utilization of techniques to improve thought processes will improve Outcome: Not Progressing Goal: Knowledge of the prescribed therapeutic regimen will improve Outcome: Not Progressing   Problem: Activity: Goal: Interest or engagement in leisure activities will improve Outcome: Not Progressing Goal: Imbalance in normal sleep/wake cycle will improve Outcome: Not Progressing   Problem: Coping: Goal: Coping ability will improve Outcome: Not Progressing Goal: Will verbalize feelings Outcome: Not Progressing   Problem: Health Behavior/Discharge Planning: Goal: Ability to make decisions will improve Outcome: Not Progressing Goal: Compliance with therapeutic regimen will improve Outcome: Not Progressing   Problem: Role Relationship: Goal: Will demonstrate positive changes in social behaviors and relationships Outcome: Not Progressing   Problem: Safety: Goal: Ability to disclose and discuss suicidal ideas will improve Outcome: Not Progressing Goal: Ability to identify and utilize support systems that promote safety will improve Outcome: Not Progressing   Problem: Self-Concept: Goal: Will verbalize positive feelings about self Outcome: Not Progressing Goal: Level of anxiety will decrease Outcome: Not Progressing   Problem: Education: Goal: Knowledge of [place] [facility] Education information/materials will improve Outcome: Not  Progressing Goal: Emotional status will improve Outcome: Not Progressing Goal: Mental status will improve Outcome: Not Progressing Goal: Verbalization of understanding the information provided will improve Outcome: Not Progressing   Problem: Activity: Goal: Interest or engagement in activities will improve Outcome: Not Progressing Goal: Sleeping patterns will improve Outcome: Not Progressing   Problem: Coping: Goal: Ability to verbalize frustrations and anger appropriately will improve Outcome: Not Progressing Goal: Ability to demonstrate self-control will improve Outcome: Not Progressing   Problem: Health Behavior/Discharge Planning: Goal: Identification of resources available to assist in meeting health care needs will improve Outcome: Not Progressing Goal: Compliance with treatment plan for underlying cause of condition will improve Outcome: Not Progressing   Problem: Physical Regulation: Goal: Ability to maintain clinical measurements within normal limits will improve Outcome: Not Progressing   Problem: Safety: Goal: Periods of time without injury will increase Outcome: Not Progressing   

## 2021-02-08 NOTE — Progress Notes (Signed)
[facility]  02/08/2021 11:08 AM Lawrence FilaRodney Morgan  MRN:  [id number]    CC: "Wanted to hurt myself."  Subjective: 40 year old male who presented to the ED via BPD from [facility] where he was threatening to "cut people's heads off" and throwing chairs. Yesterday evening patient removed two AAA batteries from TV remote and swallowed them. KUB performed and seen over the distal stomach. Gastroenterology contacted and recommends repeat KUB in 48 hours to track passage through the GI system. If remains in stomach in 48 hours can remove via endoscopy. Once past stomach, weekly KUB until passed or confirmed in stool. If battery remains in same place for a week, would also require intervention. He was unable to contract for safety, and was placed on a 1:1. No other acute events, medication compliant, and attending to ADLS.   Patient seen at bedside today. He notes he was in contact with his father, and was told he could not move to [place] to live in a trailer on his Grandmother's land. This caused him to become very upset and want to harm himself by swallowing batteries. He feels like nothing is going his way since he can't go to [facility] or [place]. He continues to desire to self harm, and is unable to contract for safety with me today. He also continues to endorse AH/VH, and passive SI. Will continue 1:1.   Principal Problem: Schizoaffective disorder, bipolar type (HCC) Diagnosis: Principal Problem:   Schizoaffective disorder, bipolar type (HCC) Active Problems:   Chronic constipation   GERD (gastroesophageal reflux disease)   Ingestion of foreign body  Total Time spent with patient: 30 minutes  Past Psychiatric History: See H&P  Past Medical History: History reviewed. No pertinent past medical history.  Past Surgical History:  Procedure Laterality Date   ESOPHAGOGASTRODUODENOSCOPY (EGD) WITH PROPOFOL N/A 01/20/2021   Procedure: ESOPHAGOGASTRODUODENOSCOPY (EGD) WITH PROPOFOL;  Surgeon:  [name], Darren, MD;  Location: [facility] ENDOSCOPY;  Service: Endoscopy;  Laterality: N/A;   Family History: History reviewed. No pertinent family history. Family Psychiatric  History: See H&P Social History:  Social History   Substance and Sexual Activity  Alcohol Use Never     Social History   Substance and Sexual Activity  Drug Use Never    Social History   Socioeconomic History   Marital status: Single    Spouse name: Not on file   Number of children: Not on file   Years of education: Not on file   Highest education level: Not on file  Occupational History   Not on file  Tobacco Use   Smoking status: Every Day    Packs/day: 1.50    Types: Cigarettes   Smokeless tobacco: Never  Vaping Use   Vaping Use: Some days   Substances: Nicotine  Substance and Sexual Activity   Alcohol use: Never   Drug use: Never   Sexual activity: Not Currently  Other Topics Concern   Not on file  Social History Narrative   Not on file   Social Determinants of Health   Financial Resource Strain: Not on file  Food Insecurity: Not on file  Transportation Needs: Not on file  Physical Activity: Not on file  Stress: Not on file  Social Connections: Not on file   Additional Social History:   Sleep: Good  Appetite:  Good  Current Medications: Current Facility-Administered Medications  Medication Dose Route Frequency Provider Last Rate Last Admin   acetaminophen (TYLENOL) tablet 650 mg  650 mg Oral Q6H  PRN [name], [name], MD   650 mg at 01/29/21 0754   alum & mag hydroxide-simeth (MAALOX/MYLANTA) 200-200-20 MG/5ML suspension 30 mL  30 mL Oral Q4H PRN [name], [name], MD       ascorbic acid (VITAMIN C) tablet 500 mg  500 mg Oral Daily Les Pou M, MD   500 mg at 02/08/21 0749   benztropine (COGENTIN) tablet 0.5 mg  0.5 mg Oral BID [name], [name], MD   0.5 mg at 02/08/21 0748   cloZAPine (CLOZARIL) tablet 100 mg  100 mg Oral QHS Jesse Sans, MD   100 mg at 02/07/21 2058   docusate  sodium (COLACE) capsule 200 mg  200 mg Oral BID [name], [name], MD   200 mg at 02/08/21 0748   hydrOXYzine (ATARAX/VISTARIL) tablet 50 mg  50 mg Oral Q6H PRN [name], [name], MD   50 mg at 02/07/21 2059   ibuprofen (ADVIL) tablet 600 mg  600 mg Oral Q6H PRN Jesse Sans, MD       lidocaine (LIDODERM) 5 % 1 patch  1 patch Transdermal Q24H Jesse Sans, MD   1 patch at 02/06/21 1306   lithium carbonate (LITHOBID) CR tablet 600 mg  600 mg Oral Q12H Jesse Sans, MD   600 mg at 02/08/21 0748   magnesium hydroxide (MILK OF MAGNESIA) suspension 30 mL  30 mL Oral Daily PRN [name], [name], MD       nicotine polacrilex (NICORETTE) gum 2 mg  2 mg Oral PRN Gabriel Cirri F, NP   2 mg at 02/06/21 1408   OLANZapine (ZYPREXA) tablet 10 mg  10 mg Oral Q6H PRN Jesse Sans, MD       OLANZapine zydis (ZYPREXA) disintegrating tablet 15 mg  15 mg Oral QHS Jesse Sans, MD   15 mg at 02/07/21 2059   oxybutynin (DITROPAN-XL) 24 hr tablet 10 mg  10 mg Oral QHS Jesse Sans, MD   10 mg at 02/07/21 2157   paliperidone (INVEGA SUSTENNA) injection 234 mg  234 mg Intramuscular Q28 days Jesse Sans, MD   234 mg at 01/30/21 1015   pantoprazole (PROTONIX) EC tablet 40 mg  40 mg Oral Daily [name], [name], MD   40 mg at 02/08/21 0748   polyethylene glycol (MIRALAX / GLYCOLAX) packet 17 g  17 g Oral Daily [name], [name], MD   17 g at 02/08/21 0749   risperiDONE (RISPERDAL) tablet 4 mg  4 mg Oral QHS Jesse Sans, MD   4 mg at 02/07/21 2058   senna (SENOKOT) tablet 8.6 mg  1 tablet Oral Daily PRN Gabriel Cirri F, NP   8.6 mg at 02/07/21 2058   sertraline (ZOLOFT) tablet 100 mg  100 mg Oral Daily [name], [name], MD   100 mg at 02/08/21 0748   traZODone (DESYREL) tablet 100 mg  100 mg Oral QHS Jesse Sans, MD   100 mg at 02/07/21 2100   traZODone (DESYREL) tablet 50 mg  50 mg Oral QHS PRN [name], NP   50 mg at 02/07/21 2100   ziprasidone (GEODON) injection 20 mg  20 mg  Intramuscular Q8H PRN Jesse Sans, MD        Lab Results:  No results found for this or any previous visit (from the past 48 hour(s)).    Blood Alcohol level:  Lab Results  Component Value Date   ETH <10 01/20/2021  Metabolic Disorder Labs: Lab Results  Component Value Date   HGBA1C 5.7 (H) 01/22/2021   MPG 116.89 01/22/2021   No results found for: PROLACTIN Lab Results  Component Value Date   CHOL 176 01/22/2021   TRIG 179 (H) 01/22/2021   HDL 47 01/22/2021   CHOLHDL 3.7 01/22/2021   VLDL 36 01/22/2021   LDLCALC 93 01/22/2021    Physical Findings: AIMS:  , ,  ,  ,    CIWA:    COWS:     Musculoskeletal: Strength & Muscle Tone: within normal limits Gait & Station: normal Patient leans: N/A  Psychiatric Specialty Exam:  Presentation  General Appearance: Appropriate for Environment  Eye Contact:Good  Speech:Clear and Coherent; Normal Rate  Speech Volume:Normal  Handedness:Right   Mood and Affect  Mood:Dysphoric  Affect:Congruent   Thought Process  Thought Processes:Coherent  Descriptions of Associations:Loose  Orientation:Full (Time, Place and Person)  Thought Content:Rumination  History of Schizophrenia/Schizoaffective disorder:Yes  Duration of Psychotic Symptoms:Greater than six months  Hallucinations:Auditory hallucinations and visual hallucinations about blood on the walls.  Ideas of Reference:Paranoia  Suicidal Thoughts:yes passive Homicidal Thoughts:yes passive no target today  Sensorium  Memory:Immediate Good  Judgment:Impaired  Insight:Poor   Executive Functions  Concentration:Fair  Attention Span:Fair  Recall:Fair  Fund of Knowledge:Fair  Language:Fair   Psychomotor Activity  Psychomotor Activity: Decreased  Assets  Assets:Desire for Improvement; Financial Resources/Insurance; Housing; Resilience; Physical Health; Social Support   Sleep  Sleep: Good, 8 hours    Blood pressure 128/72, pulse  88, temperature 98.7 F (37.1 C), resp. rate 18, height 5\' 10"  (1.778 m), weight 90.7 kg, SpO2 100 %. Body mass index is 28.7 kg/m.   Treatment Plan Summary: Daily contact with patient to assess and evaluate symptoms and progress in treatment and Medication management  02/08/21 Patient with diagnosis of schizoaffective disorder, bipolar type.  Patient endorsing hallucinations and passive HI without target. He is also endorsing suicidal ideations, and desire to self-harm by swallowing objects. Unable to contract for safety, on a 1:1.   Schizoaffective disorde, bipolar type - Continue Clozapine 100 mg oral daily at bedtime, patient unwilling to let team increase dose - Continue benzatropine tablet 0.5 mg oral 2 times daily prophylaxis for EPS - Continue olanzapine Zydis, 15 mg oral daily at bedtime and titrate to effect - Continue Risperdal 4 mg daily at bedtime - 04/10/21 injection 234 mg IM given 01/30/21 - Continue sertraline tablet 100 mg oral daily - Increase lithium carbonate CR tablet 600 mg in the morning, 900 mg in the evening (level 0.40 on 600 mg BID dosing)  Ingestion of batteries -GI contacted and recommends repeat KUB in 48 hours (Monday) to track passage through the GI system. If remains in stomach in 48 hours can remove via endoscopy. Once past stomach, weekly KUB until passed or confirmed in stool. If battery remains in same place for a week, would also require intervention.   Chronic constipation - Continue docusate sodium capsule 200 mg oral 2 times daily - Continue MiraLAX 17 g oral daily - Continue Senokot tablet 8.6 mg oral daily as needed for constipation  Overactive bladder - Continue Ditropan XL 24-hour tablet 10 mg p.o. at bedtime  Supplementation - Continue Vitamin C tablet 500 mg oral daily  GERD - Continue Protonix EC tablet 40 mg daily  Anxiety/agitation - Continue hydroxyzine tablet 50 mg oral every 6 hours as needed - Continue olanzapine tablet  10 mg oral every 6 hours as needed for agitation -  Continue ziprasidone injection 20 mg IM every 8 hours as needed for agitation  Smoking cessation/nicotine replacement - Continue nicotine gum 2 mg oral as needed  Insomnia -Continue trazodone 100 mg tab oral at bedtime  PRN, Other  --Continue Tylenol 650 mg po every 6 hrs prn pain --Continue MAALOX/MYLANTA 30 mL po every 4 hrs prn indigestion --Continue Milk of Magnesia 30 mL po daily prn constipation       Jesse Sans, MD 02/08/2021, 11:08 AM

## 2021-02-09 ENCOUNTER — Ambulatory Visit: Payer: Medicare Other

## 2021-02-09 NOTE — Group Note (Signed)
[facility] LCSW Group Therapy Note    Group Date: 02/09/2021 Start Time: 1300 End Time: 1400  Type of Therapy and Topic:  Group Therapy:  Overcoming Obstacles  Participation Level:  [facility] PARTICIPATION LEVEL: Minimal  Mood:  Description of Group:   In this group patients will be encouraged to explore what they see as obstacles to their own wellness and recovery. They will be guided to discuss their thoughts, feelings, and behaviors related to these obstacles. The group will process together ways to cope with barriers, with attention given to specific choices patients can make. Each patient will be challenged to identify changes they are motivated to make in order to overcome their obstacles. This group will be process-oriented, with patients participating in exploration of their own experiences as well as giving and receiving support and challenge from other group members.  Therapeutic Goals: 1. Patient will identify personal and current obstacles as they relate to admission. 2. Patient will identify barriers that currently interfere with their wellness or overcoming obstacles.  3. Patient will identify feelings, thought process and behaviors related to these barriers. 4. Patient will identify two changes they are willing to make to overcome these obstacles:    Summary of Patient Progress Patient did not engage in group. Patient was distracted and off topic.   Therapeutic Modalities:   Cognitive Behavioral Therapy Solution Focused Therapy Motivational Interviewing Relapse Prevention Therapy   Harden Mo, LCSW

## 2021-02-09 NOTE — Progress Notes (Signed)
[facility] MD Progress Note  02/09/2021 11:59 AM Lawrence Morgan  MRN:  [id number]    CC: "I'm okay."  Subjective: 40 year old male who presented to the ED via BPD from [facility] where he was threatening to "cut people's heads off" and throwing chairs. No acute events overnight, medication compliant, attending to ADLs. He remains on 1:1 after swallowing batteries and being unable to contract for safety to not swallow additional objects.   Patient seen at bedside today. He states that he is okay. He then proceeds to pretend to fall asleep and snore. He intermittently opens his eyes to see where I am standing. Refuses to answer any further questions today. Updated on need to repeat x-ray today and he states "okay."   Principal Problem: Schizoaffective disorder, bipolar type (HCC) Diagnosis: Principal Problem:   Schizoaffective disorder, bipolar type (HCC) Active Problems:   Chronic constipation   GERD (gastroesophageal reflux disease)   Ingestion of foreign body  Total Time spent with patient: 30 minutes  Past Psychiatric History: See H&P  Past Medical History: History reviewed. No pertinent past medical history.  Past Surgical History:  Procedure Laterality Date   ESOPHAGOGASTRODUODENOSCOPY (EGD) WITH PROPOFOL N/A 01/20/2021   Procedure: ESOPHAGOGASTRODUODENOSCOPY (EGD) WITH PROPOFOL;  Surgeon: Midge Minium, MD;  Location: [facility] ENDOSCOPY;  Service: Endoscopy;  Laterality: N/A;   Family History: History reviewed. No pertinent family history. Family Psychiatric  History: See H&P Social History:  Social History   Substance and Sexual Activity  Alcohol Use Never     Social History   Substance and Sexual Activity  Drug Use Never    Social History   Socioeconomic History   Marital status: Single    Spouse name: Not on file   Number of children: Not on file   Years of education: Not on file   Highest education level: Not on file  Occupational History   Not on file  Tobacco Use   Smoking  status: Every Day    Packs/day: 1.50    Types: Cigarettes   Smokeless tobacco: Never  Vaping Use   Vaping Use: Some days   Substances: Nicotine  Substance and Sexual Activity   Alcohol use: Never   Drug use: Never   Sexual activity: Not Currently  Other Topics Concern   Not on file  Social History Narrative   Not on file   Social Determinants of Health   Financial Resource Strain: Not on file  Food Insecurity: Not on file  Transportation Needs: Not on file  Physical Activity: Not on file  Stress: Not on file  Social Connections: Not on file   Additional Social History:   Sleep: Good  Appetite:  Good  Current Medications: Current Facility-Administered Medications  Medication Dose Route Frequency Provider Last Rate Last Admin   acetaminophen (TYLENOL) tablet 650 mg  650 mg Oral Q6H PRN [name], [name], MD   650 mg at 01/29/21 0754   alum & mag hydroxide-simeth (MAALOX/MYLANTA) 200-200-20 MG/5ML suspension 30 mL  30 mL Oral Q4H PRN [name], [name], MD       ascorbic acid (VITAMIN C) tablet 500 mg  500 mg Oral Daily Les Pou M, MD   500 mg at 02/09/21 0755   benztropine (COGENTIN) tablet 0.5 mg  0.5 mg Oral BID [name], [name], MD   0.5 mg at 02/09/21 0756   cloZAPine (CLOZARIL) tablet 100 mg  100 mg Oral QHS Jesse Sans, MD   100 mg at 02/08/21 2103   docusate sodium (  COLACE) capsule 200 mg  200 mg Oral BID [name], [name], MD   200 mg at 02/09/21 0756   hydrOXYzine (ATARAX/VISTARIL) tablet 50 mg  50 mg Oral Q6H PRN [name], [name], MD   50 mg at 02/07/21 2059   ibuprofen (ADVIL) tablet 600 mg  600 mg Oral Q6H PRN Jesse Sans, MD       lidocaine (LIDODERM) 5 % 1 patch  1 patch Transdermal Q24H Jesse Sans, MD   1 patch at 02/08/21 1243   lithium carbonate (ESKALITH) CR tablet 900 mg  900 mg Oral QHS Jesse Sans, MD   900 mg at 02/08/21 2103   lithium carbonate (LITHOBID) CR tablet 600 mg  600 mg Oral q AM Jesse Sans, MD   600 mg at 02/09/21 0758    magnesium hydroxide (MILK OF MAGNESIA) suspension 30 mL  30 mL Oral Daily PRN [name], [name], MD       nicotine polacrilex (NICORETTE) gum 2 mg  2 mg Oral PRN Gabriel Cirri F, NP   2 mg at 02/06/21 1408   OLANZapine (ZYPREXA) tablet 10 mg  10 mg Oral Q6H PRN Jesse Sans, MD       OLANZapine zydis (ZYPREXA) disintegrating tablet 15 mg  15 mg Oral QHS Jesse Sans, MD   15 mg at 02/08/21 2102   oxybutynin (DITROPAN-XL) 24 hr tablet 10 mg  10 mg Oral QHS Jesse Sans, MD   10 mg at 02/08/21 2134   paliperidone (INVEGA SUSTENNA) injection 234 mg  234 mg Intramuscular Q28 days Jesse Sans, MD   234 mg at 01/30/21 1015   pantoprazole (PROTONIX) EC tablet 40 mg  40 mg Oral Daily [name], [name], MD   40 mg at 02/09/21 0756   polyethylene glycol (MIRALAX / GLYCOLAX) packet 17 g  17 g Oral Daily [name], [name], MD   17 g at 02/08/21 0749   risperiDONE (RISPERDAL) tablet 4 mg  4 mg Oral QHS Jesse Sans, MD   4 mg at 02/08/21 2103   senna (SENOKOT) tablet 8.6 mg  1 tablet Oral Daily PRN Gabriel Cirri F, NP   8.6 mg at 02/07/21 2058   sertraline (ZOLOFT) tablet 100 mg  100 mg Oral Daily [name], [name], MD   100 mg at 02/09/21 0757   traZODone (DESYREL) tablet 100 mg  100 mg Oral QHS Jesse Sans, MD   100 mg at 02/08/21 2103   traZODone (DESYREL) tablet 50 mg  50 mg Oral QHS PRN [name], NP   50 mg at 02/07/21 2100   ziprasidone (GEODON) injection 20 mg  20 mg Intramuscular Q8H PRN Jesse Sans, MD        Lab Results:  No results found for this or any previous visit (from the past 48 hour(s)).    Blood Alcohol level:  Lab Results  Component Value Date   ETH <10 01/20/2021    Metabolic Disorder Labs: Lab Results  Component Value Date   HGBA1C 5.7 (H) 01/22/2021   MPG 116.89 01/22/2021   No results found for: PROLACTIN Lab Results  Component Value Date   CHOL 176 01/22/2021   TRIG 179 (H) 01/22/2021   HDL 47 01/22/2021   CHOLHDL 3.7  01/22/2021   VLDL 36 01/22/2021   LDLCALC 93 01/22/2021    Physical Findings: AIMS:  , ,  ,  ,    CIWA:    COWS:  Musculoskeletal: Strength & Muscle Tone: within normal limits Gait & Station: normal Patient leans: N/A  Psychiatric Specialty Exam:  Presentation  General Appearance: Appropriate for Environment  Eye Contact:Good  Speech:Clear and Coherent; Normal Rate  Speech Volume:Normal  Handedness:Right   Mood and Affect  Mood:Dysphoric  Affect:Congruent   Thought Process  Thought Processes:Coherent  Descriptions of Associations:Loose  Orientation:Full (Time, Place and Person)  Thought Content:Rumination  History of Schizophrenia/Schizoaffective disorder:Yes  Duration of Psychotic Symptoms:Greater than six months  Hallucinations:Doesn't answer today Ideas of Reference:Paranoia  Suicidal Thoughts:Doesn't answer today Homicidal Thoughts:Doesn't answer today  Sensorium  Memory:Immediate Good  Judgment:Impaired  Insight:Poor   Executive Functions  Concentration:Fair  Attention Span:Fair  Recall:Fair  Fund of Knowledge:Fair  Language:Fair   Psychomotor Activity  Psychomotor Activity: Decreased  Assets  Assets:Desire for Improvement; Financial Resources/Insurance; Housing; Resilience; Physical Health; Social Support   Sleep  Sleep: Fair, 5 hours    Blood pressure 124/82, pulse 93, temperature 98.8 F (37.1 C), temperature source Oral, resp. rate 18, height 5\' 10"  (1.778 m), weight 90.7 kg, SpO2 98 %. Body mass index is 28.7 kg/m.   Treatment Plan Summary: Daily contact with patient to assess and evaluate symptoms and progress in treatment and Medication management  02/09/21 Patient with diagnosis of schizoaffective disorder, bipolar type.  Today patient largely mute and pretending to sleep during interview. Unable to contract for safety, on a 1:1.   Schizoaffective disorde, bipolar type - Continue Clozapine 100 mg oral  daily at bedtime, patient unwilling to let team increase dose - Continue benzatropine tablet 0.5 mg oral 2 times daily prophylaxis for EPS - Continue olanzapine Zydis, 15 mg oral daily at bedtime and titrate to effect - Continue Risperdal 4 mg daily at bedtime - 04/11/21 injection 234 mg IM given 01/30/21 - Continue sertraline tablet 100 mg oral daily - Continue lithium carbonate CR tablet 600 mg in the morning, 900 mg in the evening, recheck level evening of 9/15 (level 0.40 on 600 mg BID dosing)  Ingestion of batteries -GI contacted on date of incident. Repeat KUB today to track passage through the GI system. If remains in stomach can remove via endoscopy. Once past stomach, weekly KUB until passed or confirmed in stool. If battery remains in same place for a week, would also require intervention.   Chronic constipation - Continue docusate sodium capsule 200 mg oral 2 times daily - Continue MiraLAX 17 g oral daily - Continue Senokot tablet 8.6 mg oral daily as needed for constipation  Overactive bladder - Continue Ditropan XL 24-hour tablet 10 mg p.o. at bedtime  Supplementation - Continue Vitamin C tablet 500 mg oral daily  GERD - Continue Protonix EC tablet 40 mg daily  Anxiety/agitation - Continue hydroxyzine tablet 50 mg oral every 6 hours as needed - Continue olanzapine tablet 10 mg oral every 6 hours as needed for agitation - Continue ziprasidone injection 20 mg IM every 8 hours as needed for agitation  Smoking cessation/nicotine replacement - Continue nicotine gum 2 mg oral as needed  Insomnia -Continue trazodone 100 mg tab oral at bedtime  PRN, Other  --Continue Tylenol 650 mg po every 6 hrs prn pain --Continue MAALOX/MYLANTA 30 mL po every 4 hrs prn indigestion --Continue Milk of Magnesia 30 mL po daily prn constipation       10/15, MD 02/09/2021, 11:59 AM

## 2021-02-09 NOTE — Plan of Care (Signed)
  Problem: Education: Goal: Ability to state activities that reduce stress will improve Outcome: Not Progressing   Problem: Coping: Goal: Ability to identify and develop effective coping behavior will improve Outcome: Not Progressing   Problem: Self-Concept: Goal: Ability to identify factors that promote anxiety will improve Outcome: Not Progressing Goal: Level of anxiety will decrease Outcome: Not Progressing Goal: Ability to modify response to factors that promote anxiety will improve Outcome: Not Progressing   Problem: Education: Goal: Utilization of techniques to improve thought processes will improve Outcome: Not Progressing Goal: Knowledge of the prescribed therapeutic regimen will improve Outcome: Not Progressing   Problem: Activity: Goal: Interest or engagement in leisure activities will improve Outcome: Not Progressing Goal: Imbalance in normal sleep/wake cycle will improve Outcome: Not Progressing   Problem: Coping: Goal: Coping ability will improve Outcome: Not Progressing Goal: Will verbalize feelings Outcome: Not Progressing   Problem: Health Behavior/Discharge Planning: Goal: Ability to make decisions will improve Outcome: Not Progressing Goal: Compliance with therapeutic regimen will improve Outcome: Not Progressing   Problem: Role Relationship: Goal: Will demonstrate positive changes in social behaviors and relationships Outcome: Not Progressing   Problem: Safety: Goal: Ability to disclose and discuss suicidal ideas will improve Outcome: Not Progressing Goal: Ability to identify and utilize support systems that promote safety will improve Outcome: Not Progressing   Problem: Self-Concept: Goal: Will verbalize positive feelings about self Outcome: Not Progressing Goal: Level of anxiety will decrease Outcome: Not Progressing   Problem: Education: Goal: Knowledge of [facility] Education information/materials will improve Outcome: Not  Progressing Goal: Emotional status will improve Outcome: Not Progressing Goal: Mental status will improve Outcome: Not Progressing Goal: Verbalization of understanding the information provided will improve Outcome: Not Progressing   Problem: Activity: Goal: Interest or engagement in activities will improve Outcome: Not Progressing Goal: Sleeping patterns will improve Outcome: Not Progressing   Problem: Coping: Goal: Ability to verbalize frustrations and anger appropriately will improve Outcome: Not Progressing Goal: Ability to demonstrate self-control will improve Outcome: Not Progressing   Problem: Health Behavior/Discharge Planning: Goal: Identification of resources available to assist in meeting health care needs will improve Outcome: Not Progressing Goal: Compliance with treatment plan for underlying cause of condition will improve Outcome: Not Progressing   Problem: Physical Regulation: Goal: Ability to maintain clinical measurements within normal limits will improve Outcome: Not Progressing   Problem: Safety: Goal: Periods of time without injury will increase Outcome: Not Progressing   

## 2021-02-09 NOTE — Progress Notes (Signed)
Patient has order for X-ray. Educated on reason for x-ray due to swallowing batteries. Patient verbally agreed that he would not swallow batteries. Reggie K., MHT present at beside for witness. Will continue to monitor  closely for safety.

## 2021-02-09 NOTE — Progress Notes (Signed)
1:1 observation 1900-2300 Patient has been socializing with peers on the unit. Silly at times. He denies SI, HI and AVH. Denies any impulses to swallow foreign objects. Has not passed any foreign objects yet. Safety maintained 2300-0300 Patient resting in bed with eyes closed. Refused lidocaine patch. Safety maintained 0300-0700 Patient is calm and cooperative. Resting in bed without complaints. Safety maintained

## 2021-02-09 NOTE — Progress Notes (Signed)
Patient got into a verbal altercation with a peer after the peer accused him of eating another patient's food, which he did not do. The other patient continued to harass him and he got loud and began yelling and threatening the other patient. The two patients were separated and he was able to go to his room and calm down. He requested his hs medication early and was given it per his request. He also had snack in his room so he could continue to calm down

## 2021-02-09 NOTE — Plan of Care (Addendum)
D: Patient alert and oriented this shift. Refuses to answer assessment questions this am. Endorsees SI but contracts for safety and denies a plan according to inventory sheet. Need some redirection today do to inappropriate comments. Patient on 1:1 while awake for safety due to swallowing batteries a few days ago. Patient contracted for safety. ( See progress note).  A: Labs and vital signs monitored. Patient encouraged to express emotions and supported emotionally.   R: No adverse reaction noted for medication. Cont q15 minute check for safety.  Problem: Education: Goal: Utilization of techniques to improve thought processes will improve Outcome: Progressing Goal: Knowledge of the prescribed therapeutic regimen will improve Outcome: Progressing   Problem: Activity: Goal: Interest or engagement in leisure activities will improve Outcome: Progressing Goal: Imbalance in normal sleep/wake cycle will improve Outcome: Not Progressing   Problem: Coping: Goal: Coping ability will improve Outcome: Not Progressing Goal: Will verbalize feelings Outcome: Not Progressing   Problem: Health Behavior/Discharge Planning: Goal: Ability to make decisions will improve Outcome: Not Progressing Goal: Compliance with therapeutic regimen will improve Outcome: Progressing   Problem: Health Behavior/Discharge Planning: Goal: Ability to make decisions will improve Outcome: Not Progressing Goal: Compliance with therapeutic regimen will improve Outcome: Progressing   Problem: Role Relationship: Goal: Will demonstrate positive changes in social behaviors and relationships Outcome: Not Progressing   Problem: Safety: Goal: Ability to disclose and discuss suicidal ideas will improve Outcome: Progressing Goal: Ability to identify and utilize support systems that promote safety will improve Outcome: Not Progressing   Problem: Safety: Goal: Ability to disclose and discuss suicidal ideas will  improve Outcome: Progressing Goal: Ability to identify and utilize support systems that promote safety will improve Outcome: Not Progressing   Problem: Self-Concept: Goal: Will verbalize positive feelings about self Outcome: Progressing Goal: Level of anxiety will decrease Outcome: Progressing

## 2021-02-10 NOTE — Group Note (Signed)
[facility] LCSW Group Therapy Note   Group Date: 02/10/2021 Start Time: 1300 End Time: 1400  Type of Therapy/Topic:  Group Therapy:  Feelings about Diagnosis  Participation Level:  Did Not Attend   Mood: n.a   Description of Group:    This group will allow patients to explore their thoughts and feelings about diagnoses they have received. Patients will be guided to explore their level of understanding and acceptance of these diagnoses. Facilitator will encourage patients to process their thoughts and feelings about the reactions of others to their diagnosis, and will guide patients in identifying ways to discuss their diagnosis with significant others in their lives. This group will be process-oriented, with patients participating in exploration of their own experiences as well as giving and receiving support and challenge from other group members.   Therapeutic Goals: 1. Patient will demonstrate understanding of diagnosis as evidence by identifying two or more symptoms of the disorder:  2. Patient will be able to express two feelings regarding the diagnosis 3. Patient will demonstrate ability to communicate their needs through discussion and/or role plays  Summary of Patient Progress: Patient did not attend group despite encouraged participation.     Therapeutic Modalities:   Cognitive Behavioral Therapy Brief Therapy Feelings Identification    Corky Crafts, [place]

## 2021-02-10 NOTE — Progress Notes (Signed)
   02/10/21 1230  Clinical Encounter Type  Visited With Patient not available  Visit Type Follow-up  Chaplain Burris attempted to follow up with Pt (at Pt's request via Britta Mccreedy on 9/12 pm). Will return later to try and check in. Will continue to follow as able or needed.

## 2021-02-10 NOTE — Plan of Care (Addendum)
D: Patient resting in bed this am. Prompted to get up for breakfast. Visibile on the unit throughout the day and interacting with staff and peers. Reports depression 8/10 and anxiety 10. Feeling hopeless about placement. Endorses passive SI but denies plan. Agrees to remain safe. Denies HI and AH but endorses ongoing VH. Denies pain. Takes all medication this shift  A: Vital signs and labs monitored. Patient supported emotionally and encouraged to verbalize concerns. Spoke with Social worker about discharge plans.   R: No adverse reaction to medication this shift. Patient redirected and all concerns addressed. Cont Q15 minute check for safety.  Problem: Education: Goal: Ability to state activities that reduce stress will improve 02/10/2021 1245 by Angeline Slim, RN Outcome: Progressing 02/10/2021 1239 by Angeline Slim, RN Outcome: Progressing   Problem: Coping: Goal: Ability to identify and develop effective coping behavior will improve 02/10/2021 1245 by Angeline Slim, RN Outcome: Progressing 02/10/2021 1239 by Angeline Slim, RN Outcome: Progressing   Problem: Self-Concept: Goal: Ability to identify factors that promote anxiety will improve 02/10/2021 1245 by Angeline Slim, RN Outcome: Progressing 02/10/2021 1239 by Angeline Slim, RN Outcome: Progressing Goal: Level of anxiety will decrease 02/10/2021 1245 by Angeline Slim, RN Outcome: Progressing 02/10/2021 1239 by Angeline Slim, RN Outcome: Progressing Goal: Ability to modify response to factors that promote anxiety will improve 02/10/2021 1245 by Angeline Slim, RN Outcome: Progressing 02/10/2021 1239 by Angeline Slim, RN Outcome: Progressing   Problem: Self-Concept: Goal: Level of anxiety will decrease 02/10/2021 1245 by Angeline Slim, RN Outcome: Progressing 02/10/2021 1239 by Angeline Slim, RN Outcome: Progressing   Problem: Education: Goal: Utilization of techniques to improve  thought processes will improve 02/10/2021 1245 by Angeline Slim, RN Outcome: Progressing 02/10/2021 1239 by Angeline Slim, RN Outcome: Progressing Goal: Knowledge of the prescribed therapeutic regimen will improve 02/10/2021 1245 by Angeline Slim, RN Outcome: Progressing 02/10/2021 1239 by Angeline Slim, RN Outcome: Progressing   Problem: Activity: Goal: Interest or engagement in leisure activities will improve 02/10/2021 1245 by Angeline Slim, RN Outcome: Progressing 02/10/2021 1239 by Angeline Slim, RN Outcome: Progressing Goal: Imbalance in normal sleep/wake cycle will improve 02/10/2021 1245 by Angeline Slim, RN Outcome: Progressing 02/10/2021 1239 by Angeline Slim, RN Outcome: Progressing   Problem: Coping: Goal: Coping ability will improve 02/10/2021 1245 by Angeline Slim, RN Outcome: Progressing 02/10/2021 1239 by Angeline Slim, RN Outcome: Progressing Goal: Will verbalize feelings 02/10/2021 1245 by Angeline Slim, RN Outcome: Progressing 02/10/2021 1239 by Angeline Slim, RN Outcome: Progressing   Problem: Role Relationship: Goal: Will demonstrate positive changes in social behaviors and relationships 02/10/2021 1245 by Angeline Slim, RN Outcome: Progressing 02/10/2021 1239 by Angeline Slim, RN Outcome: Progressing

## 2021-02-10 NOTE — Plan of Care (Signed)
  Problem: Education: Goal: Ability to state activities that reduce stress will improve Outcome: Progressing   Problem: Coping: Goal: Ability to identify and develop effective coping behavior will improve Outcome: Progressing   Problem: Self-Concept: Goal: Ability to identify factors that promote anxiety will improve Outcome: Progressing Goal: Level of anxiety will decrease Outcome: Progressing Goal: Ability to modify response to factors that promote anxiety will improve Outcome: Progressing   Problem: Education: Goal: Utilization of techniques to improve thought processes will improve Outcome: Progressing Goal: Knowledge of the prescribed therapeutic regimen will improve Outcome: Progressing   Problem: Activity: Goal: Interest or engagement in leisure activities will improve Outcome: Progressing Goal: Imbalance in normal sleep/wake cycle will improve Outcome: Progressing   Problem: Coping: Goal: Coping ability will improve Outcome: Progressing Goal: Will verbalize feelings Outcome: Progressing   Problem: Health Behavior/Discharge Planning: Goal: Ability to make decisions will improve Outcome: Progressing Goal: Compliance with therapeutic regimen will improve Outcome: Progressing   Problem: Role Relationship: Goal: Will demonstrate positive changes in social behaviors and relationships Outcome: Progressing   Problem: Safety: Goal: Ability to disclose and discuss suicidal ideas will improve Outcome: Progressing Goal: Ability to identify and utilize support systems that promote safety will improve Outcome: Progressing   Problem: Self-Concept: Goal: Will verbalize positive feelings about self Outcome: Progressing Goal: Level of anxiety will decrease Outcome: Progressing   Problem: Education: Goal: Knowledge of [facility] Education information/materials will improve Outcome: Progressing Goal: Emotional status will improve Outcome: Progressing Goal:  Mental status will improve Outcome: Progressing Goal: Verbalization of understanding the information provided will improve Outcome: Progressing   Problem: Activity: Goal: Interest or engagement in activities will improve Outcome: Progressing Goal: Sleeping patterns will improve Outcome: Progressing   Problem: Coping: Goal: Ability to verbalize frustrations and anger appropriately will improve Outcome: Progressing Goal: Ability to demonstrate self-control will improve Outcome: Progressing   Problem: Health Behavior/Discharge Planning: Goal: Identification of resources available to assist in meeting health care needs will improve Outcome: Progressing Goal: Compliance with treatment plan for underlying cause of condition will improve Outcome: Progressing   Problem: Physical Regulation: Goal: Ability to maintain clinical measurements within normal limits will improve Outcome: Progressing   Problem: Safety: Goal: Periods of time without injury will increase Outcome: Progressing   

## 2021-02-10 NOTE — Progress Notes (Signed)
[facility] MD Progress Note  02/10/2021 10:26 AM Lawrence Morgan  MRN:  [id number]    CC: "Person kept calling me dumba**"  Subjective: 40 year old male who presented to the ED via [facility] from [facility] where he was threatening to "cut people's heads off" and throwing chairs. Overnight patient in verbal altercation with a peer. Other patient harassing him and getting loud until this patient eventually lost temper. Patients were separated, and he requested medications early. Medication compliant, attending to ADLs, has not swallowed any further objects. Patient seen one-on-one this morning. He recounts above events. Today he continues to have passive SI, passive HI, and reports seeing monsters on the wall. No medication side effects.   Principal Problem: Schizoaffective disorder, bipolar type (HCC) Diagnosis: Principal Problem:   Schizoaffective disorder, bipolar type (HCC) Active Problems:   Chronic constipation   GERD (gastroesophageal reflux disease)   Ingestion of foreign body  Total Time spent with patient: 30 minutes  Past Psychiatric History: See H&P  Past Medical History: History reviewed. No pertinent past medical history.  Past Surgical History:  Procedure Laterality Date   ESOPHAGOGASTRODUODENOSCOPY (EGD) WITH PROPOFOL N/A 01/20/2021   Procedure: ESOPHAGOGASTRODUODENOSCOPY (EGD) WITH PROPOFOL;  Surgeon: Midge Minium, MD;  Location: [facility] ENDOSCOPY;  Service: Endoscopy;  Laterality: N/A;   Family History: History reviewed. No pertinent family history. Family Psychiatric  History: See H&P Social History:  Social History   Substance and Sexual Activity  Alcohol Use Never     Social History   Substance and Sexual Activity  Drug Use Never    Social History   Socioeconomic History   Marital status: Single    Spouse name: Not on file   Number of children: Not on file   Years of education: Not on file   Highest education level: Not on file  Occupational History   Not on file  Tobacco  Use   Smoking status: Every Day    Packs/day: 1.50    Types: Cigarettes   Smokeless tobacco: Never  Vaping Use   Vaping Use: Some days   Substances: Nicotine  Substance and Sexual Activity   Alcohol use: Never   Drug use: Never   Sexual activity: Not Currently  Other Topics Concern   Not on file  Social History Narrative   Not on file   Social Determinants of Health   Financial Resource Strain: Not on file  Food Insecurity: Not on file  Transportation Needs: Not on file  Physical Activity: Not on file  Stress: Not on file  Social Connections: Not on file   Additional Social History:   Sleep: Good  Appetite:  Good  Current Medications: Current Facility-Administered Medications  Medication Dose Route Frequency Provider Last Rate Last Admin   acetaminophen (TYLENOL) tablet 650 mg  650 mg Oral Q6H PRN [name], [name], MD   650 mg at 01/29/21 0754   alum & mag hydroxide-simeth (MAALOX/MYLANTA) 200-200-20 MG/5ML suspension 30 mL  30 mL Oral Q4H PRN [name], [name], MD       ascorbic acid (VITAMIN C) tablet 500 mg  500 mg Oral Daily Les Pou M, MD   500 mg at 02/10/21 0815   benztropine (COGENTIN) tablet 0.5 mg  0.5 mg Oral BID [name], [name], MD   0.5 mg at 02/10/21 0815   cloZAPine (CLOZARIL) tablet 100 mg  100 mg Oral QHS Jesse Sans, MD   100 mg at 02/09/21 2028   docusate sodium (COLACE) capsule 200 mg  200 mg Oral  BID [name], [name], MD   200 mg at 02/10/21 0815   hydrOXYzine (ATARAX/VISTARIL) tablet 50 mg  50 mg Oral Q6H PRN [name], [name], MD   50 mg at 02/07/21 2059   ibuprofen (ADVIL) tablet 600 mg  600 mg Oral Q6H PRN Jesse Sans, MD       lidocaine (LIDODERM) 5 % 1 patch  1 patch Transdermal Q24H Jesse Sans, MD   1 patch at 02/09/21 1638   lithium carbonate (ESKALITH) CR tablet 900 mg  900 mg Oral QHS Jesse Sans, MD   900 mg at 02/09/21 2028   lithium carbonate (LITHOBID) CR tablet 600 mg  600 mg Oral q AM Jesse Sans, MD   600 mg  at 02/10/21 0816   magnesium hydroxide (MILK OF MAGNESIA) suspension 30 mL  30 mL Oral Daily PRN [name], [name], MD       nicotine polacrilex (NICORETTE) gum 2 mg  2 mg Oral PRN Gabriel Cirri F, NP   2 mg at 02/06/21 1408   OLANZapine (ZYPREXA) tablet 10 mg  10 mg Oral Q6H PRN Jesse Sans, MD       OLANZapine zydis (ZYPREXA) disintegrating tablet 15 mg  15 mg Oral QHS Jesse Sans, MD   15 mg at 02/09/21 2029   oxybutynin (DITROPAN-XL) 24 hr tablet 10 mg  10 mg Oral QHS Jesse Sans, MD   10 mg at 02/09/21 2030   paliperidone (INVEGA SUSTENNA) injection 234 mg  234 mg Intramuscular Q28 days Jesse Sans, MD   234 mg at 01/30/21 1015   pantoprazole (PROTONIX) EC tablet 40 mg  40 mg Oral Daily [name], [name], MD   40 mg at 02/10/21 0815   polyethylene glycol (MIRALAX / GLYCOLAX) packet 17 g  17 g Oral Daily [name], [name], MD   17 g at 02/10/21 0843   risperiDONE (RISPERDAL) tablet 4 mg  4 mg Oral QHS Jesse Sans, MD   4 mg at 02/09/21 2029   senna (SENOKOT) tablet 8.6 mg  1 tablet Oral Daily PRN Gabriel Cirri F, NP   8.6 mg at 02/07/21 2058   sertraline (ZOLOFT) tablet 100 mg  100 mg Oral Daily [name], [name], MD   100 mg at 02/10/21 0815   traZODone (DESYREL) tablet 100 mg  100 mg Oral QHS Jesse Sans, MD   100 mg at 02/09/21 2029   traZODone (DESYREL) tablet 50 mg  50 mg Oral QHS PRN [name], NP   50 mg at 02/07/21 2100   ziprasidone (GEODON) injection 20 mg  20 mg Intramuscular Q8H PRN Jesse Sans, MD        Lab Results:  No results found for this or any previous visit (from the past 48 hour(s)).    Blood Alcohol level:  Lab Results  Component Value Date   ETH <10 01/20/2021    Metabolic Disorder Labs: Lab Results  Component Value Date   HGBA1C 5.7 (H) 01/22/2021   MPG 116.89 01/22/2021   No results found for: PROLACTIN Lab Results  Component Value Date   CHOL 176 01/22/2021   TRIG 179 (H) 01/22/2021   HDL 47 01/22/2021    CHOLHDL 3.7 01/22/2021   VLDL 36 01/22/2021   LDLCALC 93 01/22/2021    Physical Findings: AIMS:  , ,  ,  ,    CIWA:    COWS:     Musculoskeletal: Strength & Muscle  Tone: within normal limits Gait & Station: normal Patient leans: N/A  Psychiatric Specialty Exam:  Presentation  General Appearance: Appropriate for Environment  Eye Contact:Good  Speech:Clear and Coherent; Normal Rate  Speech Volume:Normal  Handedness:Right   Mood and Affect  Mood:Dysphoric  Affect:Congruent   Thought Process  Thought Processes:Coherent  Descriptions of Associations:Loose  Orientation:Full (Time, Place and Person)  Thought Content:Rumination  History of Schizophrenia/Schizoaffective disorder:Yes  Duration of Psychotic Symptoms:Greater than six months  Hallucinations:Monsters on the wall Ideas of Reference:Paranoia  Suicidal Thoughts:Passive SI Homicidal Thoughts:Passive HI  Sensorium  Memory:Immediate Good  Judgment:Impaired  Insight:Poor   Executive Functions  Concentration:Fair  Attention Span:Fair  Recall:Fair  Fund of Knowledge:Fair  Language:Fair   Psychomotor Activity  Psychomotor Activity: Decreased  Assets  Assets:Desire for Improvement; Financial Resources/Insurance; Housing; Resilience; Physical Health; Social Support   Sleep  Sleep: Fair, 5.75 hours    Blood pressure 124/82, pulse 93, temperature 98.8 F (37.1 C), temperature source Oral, resp. rate 18, height 5\' 10"  (1.778 m), weight 90.7 kg, SpO2 98 %. Body mass index is 28.7 kg/m.   Treatment Plan Summary: Daily contact with patient to assess and evaluate symptoms and progress in treatment and Medication management  02/10/21 Patient with diagnosis of schizoaffective disorder, bipolar type.  Patient endorsing VH of monsters on the wall, passive SI, and passive HI. He was in verbal altercation with peer overnight.   Schizoaffective disorde, bipolar type - Continue Clozapine 100  mg oral daily at bedtime, patient unwilling to let team increase dose - Continue benzatropine tablet 0.5 mg oral 2 times daily prophylaxis for EPS - Continue olanzapine Zydis, 15 mg oral daily at bedtime and titrate to effect - Continue Risperdal 4 mg daily at bedtime - 02/12/21 injection 234 mg IM given 01/30/21 - Continue sertraline tablet 100 mg oral daily - Continue lithium carbonate CR tablet 600 mg in the morning, 900 mg in the evening, recheck level evening of 9/15 (level 0.40 on 600 mg BID dosing)  Ingestion of batteries -GI contacted on date of incident. Repeat KUB yesterday show batteries in descending colon. Repeat KUB weekly until passed or confirmed in stool. If battery remains in same place for a week, would require intervention.   Chronic constipation - Continue docusate sodium capsule 200 mg oral 2 times daily - Continue MiraLAX 17 g oral daily - Continue Senokot tablet 8.6 mg oral daily as needed for constipation  Overactive bladder - Continue Ditropan XL 24-hour tablet 10 mg p.o. at bedtime  Supplementation - Continue Vitamin C tablet 500 mg oral daily  GERD - Continue Protonix EC tablet 40 mg daily  Anxiety/agitation - Continue hydroxyzine tablet 50 mg oral every 6 hours as needed - Continue olanzapine tablet 10 mg oral every 6 hours as needed for agitation - Continue ziprasidone injection 20 mg IM every 8 hours as needed for agitation  Smoking cessation/nicotine replacement - Continue nicotine gum 2 mg oral as needed  Insomnia -Continue trazodone 100 mg tab oral at bedtime  PRN, Other  --Continue Tylenol 650 mg po every 6 hrs prn pain --Continue MAALOX/MYLANTA 30 mL po every 4 hrs prn indigestion --Continue Milk of Magnesia 30 mL po daily prn constipation       10/15, MD 02/10/2021, 10:26 AM

## 2021-02-11 LAB — CBC WITH DIFFERENTIAL/PLATELET
Abs Immature Granulocytes: 0.02 10*3/uL (ref 0.00–0.07)
Basophils Absolute: 0 10*3/uL (ref 0.0–0.1)
Basophils Relative: 1 %
Eosinophils Absolute: 0.1 10*3/uL (ref 0.0–0.5)
Eosinophils Relative: 1 %
HCT: 38.5 % — ABNORMAL LOW (ref 39.0–52.0)
Hemoglobin: 12.7 g/dL — ABNORMAL LOW (ref 13.0–17.0)
Immature Granulocytes: 0 %
Lymphocytes Relative: 19 %
Lymphs Abs: 1.2 10*3/uL (ref 0.7–4.0)
MCH: 28.1 pg (ref 26.0–34.0)
MCHC: 33 g/dL (ref 30.0–36.0)
MCV: 85.2 fL (ref 80.0–100.0)
Monocytes Absolute: 0.8 10*3/uL (ref 0.1–1.0)
Monocytes Relative: 12 %
Neutro Abs: 4.4 10*3/uL (ref 1.7–7.7)
Neutrophils Relative %: 67 %
Platelets: 193 10*3/uL (ref 150–400)
RBC: 4.52 MIL/uL (ref 4.22–5.81)
RDW: 13.4 % (ref 11.5–15.5)
WBC: 6.5 10*3/uL (ref 4.0–10.5)
nRBC: 0 % (ref 0.0–0.2)

## 2021-02-11 MED ORDER — RISPERIDONE 1 MG PO TABS
2.0000 mg | ORAL_TABLET | Freq: Every day | ORAL | Status: DC
  Administered 2021-02-11: 2 mg via ORAL
  Filled 2021-02-11: qty 2

## 2021-02-11 NOTE — Progress Notes (Signed)
Patient alert and oriented x 4 affect is blunted his thoughts are organized and coherent, he appears less anxious , less intrusive, he denies SI/HI/AVH no distress noted, he was complaint with medication regimen, he is receptive to staff, no distress noted,15 minutes safety checks maintained will continue to monitor.

## 2021-02-11 NOTE — Group Note (Signed)
[facility] LCSW Group Therapy Note   Group Date: 02/11/2021 Start Time: 1310 End Time: 1400   Type of Therapy/Topic:  Group Therapy:  Emotion Regulation  Participation Level:  None   Mood:  Description of Group:    The purpose of this group is to assist patients in learning to regulate negative emotions and experience positive emotions. Patients will be guided to discuss ways in which they have been vulnerable to their negative emotions. These vulnerabilities will be juxtaposed with experiences of positive emotions or situations, and patients challenged to use positive emotions to combat negative ones. Special emphasis will be placed on coping with negative emotions in conflict situations, and patients will process healthy conflict resolution skills.  Therapeutic Goals: Patient will identify two positive emotions or experiences to reflect on in order to balance out negative emotions:  Patient will label two or more emotions that they find the most difficult to experience:  Patient will be able to demonstrate positive conflict resolution skills through discussion or role plays:   Summary of Patient Progress: Patient was present for part of the group but left halfway through. He did not participate in the discussion.   Therapeutic Modalities:   Cognitive Behavioral Therapy Feelings Identification Dialectical Behavioral Therapy   Glenis Smoker, LCSW

## 2021-02-11 NOTE — Progress Notes (Signed)
[facility] MD Progress Note  02/11/2021 1:03 PM Lawrence Morgan  MRN:  [id number]    CC: "I don't know."  Subjective: 40 year old male who presented to the ED via [facility] from [facility] where he was threatening to "cut people's heads off" and throwing chairs. No acute events overnight, medication compliant, attending to ADLs. Patient seen one-on-one this morning. He notes that he "doesn't know" how he feels this morning. He talks about a lot of death that has occurred in his family, and a feeling like he would be better off dead too. Continues to have passive SI. When asked what he feels would help those thoughts he continues to cite [facility]. He denies any AH/VH/HI today.   Principal Problem: Schizoaffective disorder, bipolar type (HCC) Diagnosis: Principal Problem:   Schizoaffective disorder, bipolar type (HCC) Active Problems:   Chronic constipation   GERD (gastroesophageal reflux disease)   Ingestion of foreign body  Total Time spent with patient: 30 minutes  Past Psychiatric History: See H&P  Past Medical History: History reviewed. No pertinent past medical history.  Past Surgical History:  Procedure Laterality Date   ESOPHAGOGASTRODUODENOSCOPY (EGD) WITH PROPOFOL N/A 01/20/2021   Procedure: ESOPHAGOGASTRODUODENOSCOPY (EGD) WITH PROPOFOL;  Surgeon: Midge Minium, MD;  Location: [facility] ENDOSCOPY;  Service: Endoscopy;  Laterality: N/A;   Family History: History reviewed. No pertinent family history. Family Psychiatric  History: See H&P Social History:  Social History   Substance and Sexual Activity  Alcohol Use Never     Social History   Substance and Sexual Activity  Drug Use Never    Social History   Socioeconomic History   Marital status: Single    Spouse name: Not on file   Number of children: Not on file   Years of education: Not on file   Highest education level: Not on file  Occupational History   Not on file  Tobacco Use   Smoking status: Every Day     Packs/day: 1.50    Types: Cigarettes   Smokeless tobacco: Never  Vaping Use   Vaping Use: Some days   Substances: Nicotine  Substance and Sexual Activity   Alcohol use: Never   Drug use: Never   Sexual activity: Not Currently  Other Topics Concern   Not on file  Social History Narrative   Not on file   Social Determinants of Health   Financial Resource Strain: Not on file  Food Insecurity: Not on file  Transportation Needs: Not on file  Physical Activity: Not on file  Stress: Not on file  Social Connections: Not on file   Additional Social History:   Sleep: Good  Appetite:  Good  Current Medications: Current Facility-Administered Medications  Medication Dose Route Frequency Provider Last Rate Last Admin   acetaminophen (TYLENOL) tablet 650 mg  650 mg Oral Q6H PRN [name], [name], MD   650 mg at 01/29/21 0754   alum & mag hydroxide-simeth (MAALOX/MYLANTA) 200-200-20 MG/5ML suspension 30 mL  30 mL Oral Q4H PRN [name], [name], MD       ascorbic acid (VITAMIN C) tablet 500 mg  500 mg Oral Daily Les Pou M, MD   500 mg at 02/11/21 0741   benztropine (COGENTIN) tablet 0.5 mg  0.5 mg Oral BID [name], [name], MD   0.5 mg at 02/11/21 0741   cloZAPine (CLOZARIL) tablet 100 mg  100 mg Oral QHS Jesse Sans, MD   100 mg at 02/10/21 2122   docusate sodium (COLACE) capsule  200 mg  200 mg Oral BID [name], [name], MD   200 mg at 02/11/21 0741   hydrOXYzine (ATARAX/VISTARIL) tablet 50 mg  50 mg Oral Q6H PRN [name], [name], MD   50 mg at 02/07/21 2059   ibuprofen (ADVIL) tablet 600 mg  600 mg Oral Q6H PRN Jesse Sans, MD       lidocaine (LIDODERM) 5 % 1 patch  1 patch Transdermal Q24H Jesse Sans, MD   1 patch at 02/09/21 1638   lithium carbonate (ESKALITH) CR tablet 900 mg  900 mg Oral QHS Jesse Sans, MD   900 mg at 02/10/21 2122   lithium carbonate (LITHOBID) CR tablet 600 mg  600 mg Oral q AM Jesse Sans, MD   600 mg at 02/11/21 3474   magnesium  hydroxide (MILK OF MAGNESIA) suspension 30 mL  30 mL Oral Daily PRN [name], [name], MD       nicotine polacrilex (NICORETTE) gum 2 mg  2 mg Oral PRN [name], NP   2 mg at 02/06/21 1408   OLANZapine (ZYPREXA) tablet 10 mg  10 mg Oral Q6H PRN Jesse Sans, MD       OLANZapine zydis (ZYPREXA) disintegrating tablet 15 mg  15 mg Oral QHS Jesse Sans, MD   15 mg at 02/10/21 2122   oxybutynin (DITROPAN-XL) 24 hr tablet 10 mg  10 mg Oral QHS Jesse Sans, MD   10 mg at 02/10/21 2123   paliperidone (INVEGA SUSTENNA) injection 234 mg  234 mg Intramuscular Q28 days Jesse Sans, MD   234 mg at 01/30/21 1015   pantoprazole (PROTONIX) EC tablet 40 mg  40 mg Oral Daily [name], [name], MD   40 mg at 02/11/21 0741   polyethylene glycol (MIRALAX / GLYCOLAX) packet 17 g  17 g Oral Daily [name], [name], MD   17 g at 02/11/21 0744   risperiDONE (RISPERDAL) tablet 4 mg  4 mg Oral QHS Jesse Sans, MD   4 mg at 02/10/21 2122   senna (SENOKOT) tablet 8.6 mg  1 tablet Oral Daily PRN Gabriel Cirri F, NP   8.6 mg at 02/07/21 2058   sertraline (ZOLOFT) tablet 100 mg  100 mg Oral Daily [name], [name], MD   100 mg at 02/11/21 0741   traZODone (DESYREL) tablet 100 mg  100 mg Oral QHS Jesse Sans, MD   100 mg at 02/10/21 2122   traZODone (DESYREL) tablet 50 mg  50 mg Oral QHS PRN Jesse Sans, MD   50 mg at 02/07/21 2100   ziprasidone (GEODON) injection 20 mg  20 mg Intramuscular Q8H PRN Jesse Sans, MD        Lab Results:  Results for orders placed or performed during the hospital encounter of 01/21/21 (from the past 48 hour(s))  CBC with Differential/Platelet     Status: Abnormal   Collection Time: 02/11/21 11:05 AM  Result Value Ref Range   WBC 6.5 4.0 - 10.5 K/uL   RBC 4.52 4.22 - 5.81 MIL/uL   Hemoglobin 12.7 (L) 13.0 - 17.0 g/dL   HCT 25.9 (L) 56.3 - 87.5 %   MCV 85.2 80.0 - 100.0 fL   MCH 28.1 26.0 - 34.0 pg   MCHC 33.0 30.0 - 36.0 g/dL   RDW 64.3 32.9 - 51.8  %   Platelets 193 150 - 400 K/uL   nRBC 0.0 0.0 - 0.2 %  Neutrophils Relative % 67 %   Neutro Abs 4.4 1.7 - 7.7 K/uL   Lymphocytes Relative 19 %   Lymphs Abs 1.2 0.7 - 4.0 K/uL   Monocytes Relative 12 %   Monocytes Absolute 0.8 0.1 - 1.0 K/uL   Eosinophils Relative 1 %   Eosinophils Absolute 0.1 0.0 - 0.5 K/uL   Basophils Relative 1 %   Basophils Absolute 0.0 0.0 - 0.1 K/uL   Immature Granulocytes 0 %   Abs Immature Granulocytes 0.02 0.00 - 0.07 K/uL    Comment: Performed at [facility], [address] [place], [place] [address]      Blood Alcohol level:  Lab Results  Component Value Date   [facility] <10 01/20/2021    Metabolic Disorder Labs: Lab Results  Component Value Date   HGBA1C 5.7 (H) 01/22/2021   MPG 116.89 01/22/2021   No results found for: PROLACTIN Lab Results  Component Value Date   CHOL 176 01/22/2021   TRIG 179 (H) 01/22/2021   HDL 47 01/22/2021   CHOLHDL 3.7 01/22/2021   VLDL 36 01/22/2021   LDLCALC 93 01/22/2021    Physical Findings: AIMS:  , ,  ,  ,    CIWA:    COWS:     Musculoskeletal: Strength & Muscle Tone: within normal limits Gait & Station: normal Patient leans: N/A  Psychiatric Specialty Exam:  Presentation  General Appearance: Appropriate for Environment  Eye Contact:Good  Speech:Clear and Coherent; Normal Rate  Speech Volume:Normal  Handedness:Right   Mood and Affect  Mood:Dysphoric  Affect:Congruent   Thought Process  Thought Processes:Coherent  Descriptions of Associations:Loose  Orientation:Full (Time, Place and Person)  Thought Content:Rumination  History of Schizophrenia/Schizoaffective disorder:Yes  Duration of Psychotic Symptoms:Greater than six months  Hallucinations:Denies Ideas of Reference:None  Suicidal Thoughts:Passive SI Homicidal Thoughts:Denies  Sensorium  Memory:Immediate Good  Judgment:Impaired  Insight:Poor   Executive Functions  Concentration:Fair  Attention  Span:Fair  Recall:Fair  Fund of Knowledge:Fair  Language:Fair   Psychomotor Activity  Psychomotor Activity: Decreased  Assets  Assets:Desire for Improvement; Financial Resources/Insurance; Housing; Resilience; Physical Health; Social Support   Sleep  Sleep: Fair, 5.75 hours    Blood pressure 116/78, pulse 86, temperature 98.1 F (36.7 C), resp. rate 17, height 5\' 10"  (1.778 m), weight 90.7 kg, SpO2 100 %. Body mass index is 28.7 kg/m.   Treatment Plan Summary: Daily contact with patient to assess and evaluate symptoms and progress in treatment and Medication management  02/11/21: Patient endorsing passive SI today. Denies HI/AH/VH. [facility]   Schizoaffective disorde, bipolar type - Continue Clozapine 100 mg oral daily at bedtime, patient unwilling to let team increase dose - Continue benzatropine tablet 0.5 mg oral 2 times daily prophylaxis for EPS - Continue olanzapine Zydis, 15 mg oral daily at bedtime and titrate to effect - Decrease Risperdal 2 mg daily at bedtime with plan to taper off now that bridge complete for invega sustenna.  02/13/21 injection 234 mg IM given 01/30/21 - Continue sertraline tablet 100 mg oral daily - Continue lithium carbonate CR tablet 600 mg in the morning, 900 mg in the evening, recheck level evening of 9/15 (level 0.40 on 600 mg BID dosing)  Ingestion of batteries -GI contacted on date of incident. Repeat KUB shows batteries in descending colon. Repeat KUB weekly until passed or confirmed in stool. If battery remains in same place for a week, would require intervention.   Chronic constipation - Continue  docusate sodium capsule 200 mg oral 2 times daily - Continue MiraLAX 17 g oral daily - Continue Senokot tablet 8.6 mg oral daily as needed for constipation  Overactive bladder - Continue Ditropan XL 24-hour tablet 10 mg p.o. at bedtime  Supplementation - Continue  Vitamin C tablet 500 mg oral daily  GERD - Continue Protonix EC tablet 40 mg daily  Anxiety/agitation - Continue hydroxyzine tablet 50 mg oral every 6 hours as needed - Continue olanzapine tablet 10 mg oral every 6 hours as needed for agitation - Continue ziprasidone injection 20 mg IM every 8 hours as needed for agitation  Smoking cessation/nicotine replacement - Continue nicotine gum 2 mg oral as needed  Insomnia -Continue trazodone 100 mg tab oral at bedtime  PRN, Other  --Continue Tylenol 650 mg po every 6 hrs prn pain --Continue MAALOX/MYLANTA 30 mL po every 4 hrs prn indigestion --Continue Milk of Magnesia 30 mL po daily prn constipation       Jesse Sans, MD 02/11/2021, 1:03 PM

## 2021-02-11 NOTE — Progress Notes (Signed)
Pharmacy - Clozapine     This patient's order has been reviewed for prescribing contraindications.   Labs:   02/04/21 ANC 2,700 02/11/21 ANC 4,400  The medication is being dispensed pursuant to the FDA REMS suspension order of 04/18/20 that allows for dispensing without a patient REMS dispense authorization (RDA).   Will continue to check ANC weekly.  Bettey Costa, PharmD Clinical Pharmacist 02/11/2021 2:37 PM

## 2021-02-11 NOTE — Progress Notes (Signed)
D: Patient resting in bed this am. Prompted for breakfast. 'Endoreses passive SI. Denies plan and agrees to remain safe. Denies HI/AH/VH this day. Rates depression a 8/10 and depression a 10/10. Reports pain 5/10 but denies medication at present. Takes 1300 medication for back pain and all other scheduled medications without any issues.   A: Labs and vital signs monitored. Patient supported emotionally and encouraged to verbalize needs and concerns. No concerns verbalized this shift.   R: No adverse reaction to medications noted. Cont Q15 minute checks for safety.

## 2021-02-11 NOTE — Plan of Care (Signed)
  Problem: Group Participation Goal: STG - Patient will engage in groups without prompting or encouragement from LRT x3 group sessions within 5 recreation therapy group sessions Description: STG - Patient will engage in groups without prompting or encouragement from LRT x3 group sessions within 5 recreation therapy group sessions Outcome: Not Progressing   

## 2021-02-11 NOTE — Plan of Care (Signed)
  Problem: Education: Goal: Ability to state activities that reduce stress will improve Outcome: Progressing   Problem: Coping: Goal: Ability to identify and develop effective coping behavior will improve Outcome: Progressing   Problem: Self-Concept: Goal: Ability to identify factors that promote anxiety will improve Outcome: Progressing Goal: Level of anxiety will decrease Outcome: Progressing Goal: Ability to modify response to factors that promote anxiety will improve Outcome: Progressing   Problem: Education: Goal: Utilization of techniques to improve thought processes will improve Outcome: Progressing Goal: Knowledge of the prescribed therapeutic regimen will improve Outcome: Progressing   Problem: Activity: Goal: Interest or engagement in leisure activities will improve Outcome: Progressing Goal: Imbalance in normal sleep/wake cycle will improve Outcome: Progressing   Problem: Coping: Goal: Coping ability will improve Outcome: Progressing Goal: Will verbalize feelings Outcome: Progressing

## 2021-02-11 NOTE — Progress Notes (Signed)
Recreation Therapy Notes  Date: 02/11/2021  Time: 9:45am   Location: Courtyard   Behavioral response: N/A   Intervention Topic: Leisure   Discussion/Intervention: Patient did not attend group.   Clinical Observations/Feedback:  Patient did not attend group.     LRT/CTRS           02/11/2021 12:01 PM 

## 2021-02-12 ENCOUNTER — Ambulatory Visit: Payer: Medicare Other

## 2021-02-12 DIAGNOSIS — Z87821 Personal history of retained foreign body fully removed: Secondary | ICD-10-CM | POA: Insufficient documentation

## 2021-02-12 LAB — IRON AND TIBC
Iron: 64 ug/dL (ref 45–182)
Saturation Ratios: 18 % (ref 17.9–39.5)
TIBC: 347 ug/dL (ref 250–450)
UIBC: 283 ug/dL

## 2021-02-12 LAB — LITHIUM LEVEL: Lithium Lvl: 1.04 mmol/L (ref 0.60–1.20)

## 2021-02-12 NOTE — Group Note (Signed)
[facility] LCSW Group Therapy Note   Group Date: 02/12/2021 Start Time: 1300 End Time: 1400   Type of Therapy/Topic:  Group Therapy:  Balance in Life  Participation Level:  Did Not Attend   Description of Group:    This group will address the concept of balance and how it feels and looks when one is unbalanced. Patients will be encouraged to process areas in their lives that are out of balance, and identify reasons for remaining unbalanced. Facilitators will guide patients utilizing problem- solving interventions to address and correct the stressor making their life unbalanced. Understanding and applying boundaries will be explored and addressed for obtaining  and maintaining a balanced life. Patients will be encouraged to explore ways to assertively make their unbalanced needs known to significant others in their lives, using other group members and facilitator for support and feedback.  Therapeutic Goals: Patient will identify two or more emotions or situations they have that consume much of in their lives. Patient will identify signs/triggers that life has become out of balance:  Patient will identify two ways to set boundaries in order to achieve balance in their lives:  Patient will demonstrate ability to communicate their needs through discussion and/or role plays  Summary of Patient Progress: X    Therapeutic Modalities:   Cognitive Behavioral Therapy Solution-Focused Therapy Assertiveness Training   Harden Mo, LCSW

## 2021-02-12 NOTE — Progress Notes (Signed)
Patient alert and oriented x 4 affect is blunted his thoughts are organized and coherent, he appears less anxious , less intrusive, he denies SI/HI/AVH no distress noted, he was complaint with medication regimen, he is receptive to staff, no distress noted,15 minutes safety checks maintained will continue to monitor.  

## 2021-02-12 NOTE — BH IP Treatment Plan (Signed)
Interdisciplinary Treatment and Diagnostic Plan Update  02/12/2021 Time of Session: 8:30AM Lawrence Morgan MRN: [id number]  Principal Diagnosis: Schizoaffective disorder, bipolar type [facility])  Secondary Diagnoses: Principal Problem:   Schizoaffective disorder, bipolar type (HCC) Active Problems:   Chronic constipation   GERD (gastroesophageal reflux disease)   Ingestion of foreign body   Current Medications:  Current Facility-Administered Medications  Medication Dose Route Frequency Provider Last Rate Last Admin   acetaminophen (TYLENOL) tablet 650 mg  650 mg Oral Q6H PRN [name], [name], MD   650 mg at 01/29/21 0754   alum & mag hydroxide-simeth (MAALOX/MYLANTA) 200-200-20 MG/5ML suspension 30 mL  30 mL Oral Q4H PRN [name], [name], MD       ascorbic acid (VITAMIN C) tablet 500 mg  500 mg Oral Daily Les Pou M, MD   500 mg at 02/12/21 0758   benztropine (COGENTIN) tablet 0.5 mg  0.5 mg Oral BID [name], [name], MD   0.5 mg at 02/12/21 0752   cloZAPine (CLOZARIL) tablet 100 mg  100 mg Oral QHS Jesse Sans, MD   100 mg at 02/11/21 2131   docusate sodium (COLACE) capsule 200 mg  200 mg Oral BID [name], [name], MD   200 mg at 02/12/21 0751   hydrOXYzine (ATARAX/VISTARIL) tablet 50 mg  50 mg Oral Q6H PRN [name], [name], MD   50 mg at 02/07/21 2059   ibuprofen (ADVIL) tablet 600 mg  600 mg Oral Q6H PRN Jesse Sans, MD       lidocaine (LIDODERM) 5 % 1 patch  1 patch Transdermal Q24H Jesse Sans, MD   1 patch at 02/11/21 1300   lithium carbonate (ESKALITH) CR tablet 900 mg  900 mg Oral QHS Jesse Sans, MD   900 mg at 02/11/21 2131   lithium carbonate (LITHOBID) CR tablet 600 mg  600 mg Oral q AM Jesse Sans, MD   600 mg at 02/11/21 0743   magnesium hydroxide (MILK OF MAGNESIA) suspension 30 mL  30 mL Oral Daily PRN [name], [name], MD       nicotine polacrilex (NICORETTE) gum 2 mg  2 mg Oral PRN Gabriel Cirri F, NP   2 mg at 02/06/21 1408   OLANZapine (ZYPREXA)  tablet 10 mg  10 mg Oral Q6H PRN Jesse Sans, MD       OLANZapine zydis (ZYPREXA) disintegrating tablet 15 mg  15 mg Oral QHS Jesse Sans, MD   15 mg at 02/11/21 2132   oxybutynin (DITROPAN-XL) 24 hr tablet 10 mg  10 mg Oral QHS Jesse Sans, MD   10 mg at 02/11/21 2133   paliperidone (INVEGA SUSTENNA) injection 234 mg  234 mg Intramuscular Q28 days Jesse Sans, MD   234 mg at 01/30/21 1015   pantoprazole (PROTONIX) EC tablet 40 mg  40 mg Oral Daily [name], [name], MD   40 mg at 02/12/21 0757   polyethylene glycol (MIRALAX / GLYCOLAX) packet 17 g  17 g Oral Daily [name], [name], MD   17 g at 02/11/21 0744   risperiDONE (RISPERDAL) tablet 2 mg  2 mg Oral QHS Jesse Sans, MD   2 mg at 02/11/21 2131   senna (SENOKOT) tablet 8.6 mg  1 tablet Oral Daily PRN Gabriel Cirri F, NP   8.6 mg at 02/07/21 2058   sertraline (ZOLOFT) tablet 100 mg  100 mg Oral Daily [name], [name], MD   100 mg at  02/12/21 0751   traZODone (DESYREL) tablet 100 mg  100 mg Oral QHS Jesse Sans, MD   100 mg at 02/11/21 2132   traZODone (DESYREL) tablet 50 mg  50 mg Oral QHS PRN Jesse Sans, MD   50 mg at 02/07/21 2100   ziprasidone (GEODON) injection 20 mg  20 mg Intramuscular Q8H PRN Jesse Sans, MD       PTA Medications: Medications Prior to Admission  Medication Sig Dispense Refill Last Dose   benztropine (COGENTIN) 1 MG tablet Take 1 mg by mouth 2 (two) times daily.      cloZAPine (CLOZARIL) 100 MG tablet Take 500 mg by mouth at bedtime.      famotidine (PEPCID) 20 MG tablet Take 20 mg by mouth daily.      fluvoxaMINE (LUVOX) 50 MG tablet Take 50 mg by mouth at bedtime.      lithium 300 MG tablet Take 600 mg by mouth 2 (two) times daily.      OLANZapine (ZYPREXA) 15 MG tablet Take 15 mg by mouth at bedtime.      oxybutynin (DITROPAN-XL) 10 MG 24 hr tablet Take 10 mg by mouth daily.       Patient Stressors: Health problems Other: SI/HI/AVH  Patient Strengths: Primary school teacher for treatment/growth  Treatment Modalities: Medication Management, Group therapy, Case management,  1 to 1 session with clinician, Psychoeducation, Recreational therapy.   Physician Treatment Plan for Primary Diagnosis: Schizoaffective disorder, bipolar type (HCC) Long Term Goal(s): Improvement in symptoms so as ready for discharge   Short Term Goals: Ability to identify changes in lifestyle to reduce recurrence of condition will improve Ability to disclose and discuss suicidal ideas Ability to demonstrate self-control will improve Ability to identify and develop effective coping behaviors will improve Ability to maintain clinical measurements within normal limits will improve Compliance with prescribed medications will improve  Medication Management: Evaluate patient's response, side effects, and tolerance of medication regimen.  Therapeutic Interventions: 1 to 1 sessions, Unit Group sessions and Medication administration.  Evaluation of Outcomes: Progressing  Physician Treatment Plan for Secondary Diagnosis: Principal Problem:   Schizoaffective disorder, bipolar type (HCC) Active Problems:   Chronic constipation   GERD (gastroesophageal reflux disease)   Ingestion of foreign body  Long Term Goal(s): Improvement in symptoms so as ready for discharge   Short Term Goals: Ability to identify changes in lifestyle to reduce recurrence of condition will improve Ability to disclose and discuss suicidal ideas Ability to demonstrate self-control will improve Ability to identify and develop effective coping behaviors will improve Ability to maintain clinical measurements within normal limits will improve Compliance with prescribed medications will improve     Medication Management: Evaluate patient's response, side effects, and tolerance of medication regimen.  Therapeutic Interventions: 1 to 1 sessions, Unit Group sessions and Medication  administration.  Evaluation of Outcomes: Progressing   RN Treatment Plan for Primary Diagnosis: Schizoaffective disorder, bipolar type (HCC) Long Term Goal(s): Knowledge of disease and therapeutic regimen to maintain health will improve  Short Term Goals: Ability to demonstrate self-control, Ability to participate in decision making will improve, Ability to verbalize feelings will improve, Ability to disclose and discuss suicidal ideas, Ability to identify and develop effective coping behaviors will improve, and Compliance with prescribed medications will improve  Medication Management: RN will administer medications as ordered by provider, will assess and evaluate patient's response and provide education to patient for prescribed medication. RN will report any adverse and/or side effects  to prescribing provider.  Therapeutic Interventions: 1 on 1 counseling sessions, Psychoeducation, Medication administration, Evaluate responses to treatment, Monitor vital signs and CBGs as ordered, Perform/monitor CIWA, COWS, AIMS and Fall Risk screenings as ordered, Perform wound care treatments as ordered.  Evaluation of Outcomes: Progressing   LCSW Treatment Plan for Primary Diagnosis: Schizoaffective disorder, bipolar type (HCC) Long Term Goal(s): Safe transition to appropriate next level of care at discharge, Engage patient in therapeutic group addressing interpersonal concerns.  Short Term Goals: Engage patient in aftercare planning with referrals and resources, Increase social support, Increase ability to appropriately verbalize feelings, Increase emotional regulation, Facilitate acceptance of mental health diagnosis and concerns, and Increase skills for wellness and recovery  Therapeutic Interventions: Assess for all discharge needs, 1 to 1 time with Social worker, Explore available resources and support systems, Assess for adequacy in community support network, Educate family and significant other(s)  on suicide prevention, Complete Psychosocial Assessment, Interpersonal group therapy.  Evaluation of Outcomes: Progressing   Progress in Treatment: Attending groups: Yes. Participating in groups: Yes. Taking medication as prescribed: Yes. Toleration medication: Yes. Family/Significant other contact made: Yes, individual(s) contacted:  SPE completed with the patient and guardian. Patient understands diagnosis: Yes. Discussing patient identified problems/goals with staff: Yes. Medical problems stabilized or resolved: Yes. Denies suicidal/homicidal ideation: Yes. Issues/concerns per patient self-inventory: No. Other: none    Patient Goals:              Discharge Plan or Barriers:    Reason for Continuation of Hospitalization: Delusions  Homicidal ideation Suicidal ideation   Estimated Length of Stay: 1-7 days     New problem(s) identified: No, Describe:  None   New Short Term/Long Term Goal(s): elimination of symptoms of psychosis, medication management for mood stabilization; development of comprehensive mental wellness plan. Update 01/28/21: None  02/02/21: None Update 02/02/2021: Continue to work toward elimination of symptoms of psychosis, medication management for mood stabilization; elimination of SI thoughts; development of comprehensive mental wellness/sobriety plan. Update 02/12/2021: No changes at this time.    Patient Goals: "Get in contact with my dad and get long-term treatment."  Pt declined to sign treatment team form. Update 01/28/21: None Update 02/02/21: None Update 02/07/2021: No additional goals identified since last update. Patient continues to be preoccupied on getting into [facility]. CSW discussed with patient he is not appropriate for [facility]; redirected patient to returning to community.  Update 02/12/2021: No changes at this time.    Discharge Plan or Barriers: Pt specifically mentions going to [facility]. He was informed that he did not meet criteria for [facility]'s level of  care. CSW will assist pt, along with guardian feedback/approval, with development of an appropriate aftercare/discharge plan. Update 01/28/21: CSW sent referral to [facility] per pt's request. Guardian was notified. Pt was notified that placement to [facility] is unlikely. CSW was informed by group home that pt can return when psychiatrically cleared.  Update 02/02/21: None Update 02/07/2021: No additional barriers identified since last update.  Update 02/12/2021:  Patient continues to desire to go to [facility], however, is now open to returning to his group home while he waits on the opportunity.  Patient has swallowed batteries over the weekend and physician is monitoring their path.   Reason for Continuation of Hospitalization: Depression Hallucinations Medication stabilization   Scribe for Treatment Team: Harden Mo, Alexander Mt 02/12/2021 9:03 AM

## 2021-02-12 NOTE — Progress Notes (Signed)
[facility] MD Progress Note  02/12/2021 11:46 AM Lawrence Morgan  MRN:  [id number]    CC: "I'm okay."  Subjective: 40 year old male who presented to the ED via [facility] from [facility] where he was threatening to "cut people's heads off" and throwing chairs. No acute events overnight, medication compliant, attending to ADLs. Patient seen one-on-one today. He denies SI/HI/AH/VH. Denies feelings of depression or anxiety. Lithium level therapeutic at 1.04. Iron studies within normal limits. KUB shows progression of batteries through the colon without obstruction. Patient inquiring about returning to his group home soon.   Principal Problem: Schizoaffective disorder, bipolar type (HCC) Diagnosis: Principal Problem:   Schizoaffective disorder, bipolar type (HCC) Active Problems:   Chronic constipation   GERD (gastroesophageal reflux disease)   Ingestion of foreign body  Total Time spent with patient: 30 minutes  Past Psychiatric History: See H&P  Past Medical History: History reviewed. No pertinent past medical history.  Past Surgical History:  Procedure Laterality Date   ESOPHAGOGASTRODUODENOSCOPY (EGD) WITH PROPOFOL N/A 01/20/2021   Procedure: ESOPHAGOGASTRODUODENOSCOPY (EGD) WITH PROPOFOL;  Surgeon: Midge Minium, MD;  Location: [facility] ENDOSCOPY;  Service: Endoscopy;  Laterality: N/A;   Family History: History reviewed. No pertinent family history. Family Psychiatric  History: See H&P Social History:  Social History   Substance and Sexual Activity  Alcohol Use Never     Social History   Substance and Sexual Activity  Drug Use Never    Social History   Socioeconomic History   Marital status: Single    Spouse name: Not on file   Number of children: Not on file   Years of education: Not on file   Highest education level: Not on file  Occupational History   Not on file  Tobacco Use   Smoking status: Every Day    Packs/day: 1.50    Types: Cigarettes   Smokeless tobacco: Never  Vaping Use    Vaping Use: Some days   Substances: Nicotine  Substance and Sexual Activity   Alcohol use: Never   Drug use: Never   Sexual activity: Not Currently  Other Topics Concern   Not on file  Social History Narrative   Not on file   Social Determinants of Health   Financial Resource Strain: Not on file  Food Insecurity: Not on file  Transportation Needs: Not on file  Physical Activity: Not on file  Stress: Not on file  Social Connections: Not on file   Additional Social History:   Sleep: Good  Appetite:  Good  Current Medications: Current Facility-Administered Medications  Medication Dose Route Frequency Provider Last Rate Last Admin   acetaminophen (TYLENOL) tablet 650 mg  650 mg Oral Q6H PRN [name], [name], MD   650 mg at 01/29/21 0754   alum & mag hydroxide-simeth (MAALOX/MYLANTA) 200-200-20 MG/5ML suspension 30 mL  30 mL Oral Q4H PRN [name], [name], MD       ascorbic acid (VITAMIN C) tablet 500 mg  500 mg Oral Daily Les Pou M, MD   500 mg at 02/12/21 0758   benztropine (COGENTIN) tablet 0.5 mg  0.5 mg Oral BID [name], [name], MD   0.5 mg at 02/12/21 0752   cloZAPine (CLOZARIL) tablet 100 mg  100 mg Oral QHS Jesse Sans, MD   100 mg at 02/11/21 2131   docusate sodium (COLACE) capsule 200 mg  200 mg Oral BID [name], [name], MD   200 mg at 02/12/21 0751   hydrOXYzine (ATARAX/VISTARIL) tablet 50 mg  50  mg Oral Q6H PRN [name], [name], MD   50 mg at 02/07/21 2059   ibuprofen (ADVIL) tablet 600 mg  600 mg Oral Q6H PRN Jesse Sans, MD       lidocaine (LIDODERM) 5 % 1 patch  1 patch Transdermal Q24H Jesse Sans, MD   1 patch at 02/11/21 1300   lithium carbonate (ESKALITH) CR tablet 900 mg  900 mg Oral QHS Jesse Sans, MD   900 mg at 02/11/21 2131   lithium carbonate (LITHOBID) CR tablet 600 mg  600 mg Oral q AM Jesse Sans, MD   600 mg at 02/11/21 9562   magnesium hydroxide (MILK OF MAGNESIA) suspension 30 mL  30 mL Oral Daily PRN [name], [name], MD        nicotine polacrilex (NICORETTE) gum 2 mg  2 mg Oral PRN [name], NP   2 mg at 02/06/21 1408   OLANZapine (ZYPREXA) tablet 10 mg  10 mg Oral Q6H PRN Jesse Sans, MD       OLANZapine zydis (ZYPREXA) disintegrating tablet 15 mg  15 mg Oral QHS Jesse Sans, MD   15 mg at 02/11/21 2132   oxybutynin (DITROPAN-XL) 24 hr tablet 10 mg  10 mg Oral QHS Jesse Sans, MD   10 mg at 02/11/21 2133   paliperidone (INVEGA SUSTENNA) injection 234 mg  234 mg Intramuscular Q28 days Jesse Sans, MD   234 mg at 01/30/21 1015   pantoprazole (PROTONIX) EC tablet 40 mg  40 mg Oral Daily [name], [name], MD   40 mg at 02/12/21 0757   polyethylene glycol (MIRALAX / GLYCOLAX) packet 17 g  17 g Oral Daily [name], [name], MD   17 g at 02/11/21 0744   risperiDONE (RISPERDAL) tablet 2 mg  2 mg Oral QHS Jesse Sans, MD   2 mg at 02/11/21 2131   senna (SENOKOT) tablet 8.6 mg  1 tablet Oral Daily PRN Gabriel Cirri F, NP   8.6 mg at 02/07/21 2058   sertraline (ZOLOFT) tablet 100 mg  100 mg Oral Daily [name], [name], MD   100 mg at 02/12/21 0751   traZODone (DESYREL) tablet 100 mg  100 mg Oral QHS Jesse Sans, MD   100 mg at 02/11/21 2132   traZODone (DESYREL) tablet 50 mg  50 mg Oral QHS PRN Jesse Sans, MD   50 mg at 02/07/21 2100   ziprasidone (GEODON) injection 20 mg  20 mg Intramuscular Q8H PRN Jesse Sans, MD        Lab Results:  Results for orders placed or performed during the hospital encounter of 01/21/21 (from the past 48 hour(s))  CBC with Differential/Platelet     Status: Abnormal   Collection Time: 02/11/21 11:05 AM  Result Value Ref Range   WBC 6.5 4.0 - 10.5 K/uL   RBC 4.52 4.22 - 5.81 MIL/uL   Hemoglobin 12.7 (L) 13.0 - 17.0 g/dL   HCT 13.0 (L) 86.5 - 78.4 %   MCV 85.2 80.0 - 100.0 fL   MCH 28.1 26.0 - 34.0 pg   MCHC 33.0 30.0 - 36.0 g/dL   RDW 69.6 29.5 - 28.4 %   Platelets 193 150 - 400 K/uL   nRBC 0.0 0.0 - 0.2 %   Neutrophils Relative % 67 %    Neutro Abs 4.4 1.7 - 7.7 K/uL   Lymphocytes Relative 19 %   Lymphs Abs 1.2 0.7 -  4.0 K/uL   Monocytes Relative 12 %   Monocytes Absolute 0.8 0.1 - 1.0 K/uL   Eosinophils Relative 1 %   Eosinophils Absolute 0.1 0.0 - 0.5 K/uL   Basophils Relative 1 %   Basophils Absolute 0.0 0.0 - 0.1 K/uL   Immature Granulocytes 0 %   Abs Immature Granulocytes 0.02 0.00 - 0.07 K/uL    Comment: Performed at [facility], [address] [place], [place] [address]  Lithium level     Status: None   Collection Time: 02/12/21  9:53 AM  Result Value Ref Range   Lithium Lvl 1.04 0.60 - 1.20 mmol/L    Comment: Performed at [facility], [address] [place], [place] [address]  Iron and TIBC     Status: None   Collection Time: 02/12/21  9:53 AM  Result Value Ref Range   Iron 64 45 - 182 ug/dL   TIBC 619 509 - 326 ug/dL   Saturation Ratios 18 17.9 - 39.5 %   UIBC 283 ug/dL    Comment: Performed at [facility], [address] [place], [place] [address]      Blood Alcohol level:  Lab Results  Component Value Date   [facility] <10 01/20/2021    Metabolic Disorder Labs: Lab Results  Component Value Date   HGBA1C 5.7 (H) 01/22/2021   MPG 116.89 01/22/2021   No results found for: PROLACTIN Lab Results  Component Value Date   CHOL 176 01/22/2021   TRIG 179 (H) 01/22/2021   HDL 47 01/22/2021   CHOLHDL 3.7 01/22/2021   VLDL 36 01/22/2021   LDLCALC 93 01/22/2021    Physical Findings: AIMS:  , ,  ,  ,    CIWA:    COWS:     Musculoskeletal: Strength & Muscle Tone: within normal limits Gait & Station: normal Patient leans: N/A  Psychiatric Specialty Exam:  Presentation  General Appearance: Appropriate for Environment  Eye Contact:Good  Speech:Clear and Coherent; Normal Rate  Speech Volume:Normal  Handedness:Right   Mood and Affect  Mood:Euthymic  Affect:Congruent   Thought Process  Thought Processes:Coherent  Descriptions of  Associations:Loose  Orientation:Full (Time, Place and Person)  Thought Content:Rumination  History of Schizophrenia/Schizoaffective disorder:Yes  Duration of Psychotic Symptoms:Greater than six months  Hallucinations:Denies Ideas of Reference:None  Suicidal Thoughts:Denies Homicidal Thoughts:Denies  Sensorium  Memory:Immediate Good  Judgment:Impaired  Insight:Poor   Executive Functions  Concentration:Fair  Attention Span:Fair  Recall:Fair  Fund of Knowledge:Fair  Language:Fair   Psychomotor Activity  Psychomotor Activity: Decreased  Assets  Assets:Desire for Improvement; Financial Resources/Insurance; Housing; Resilience; Physical Health; Social Support   Sleep  Sleep: Good, 7.5 hours    Blood pressure 127/78, pulse 89, temperature 98.3 F (36.8 C), temperature source Oral, resp. rate 17, height 5\' 10"  (1.778 m), weight 90.7 kg, SpO2 100 %. Body mass index is 28.7 kg/m.   Treatment Plan Summary: Daily contact with patient to assess and evaluate symptoms and progress in treatment and Medication management  02/12/21: Patient denies SI/HI/AH/VH today. Lithium level therapeutic. Batteries remain in colon, but are progressing without obstruction. Patient now expressing desire to return to group home which will be appropriate once batteries have been evacuated. Anticipate this occurring by early next week.   Schizoaffective disorde, bipolar type - Continue Clozapine 100 mg oral daily at bedtime, patient unwilling to let team increase dose - Continue benzatropine tablet 0.5 mg oral 2 times daily prophylaxis for EPS - Continue olanzapine Zydis, 15 mg oral daily  at bedtime and titrate to effect - Discontinue oral Risperdal (oral bridge completed) - Gean Birchwood injection 234 mg IM given 01/30/21 - Continue sertraline tablet 100 mg oral daily - Continue lithium carbonate CR tablet 600 mg in the morning, 900 mg in the evening. Lithium level 1.04 on  9/15  Ingestion of batteries -GI contacted on date of incident. Repeat KUB shows batteries progressing through colon without obstruction. If battery remains in same place for a week, would require intervention.   Chronic constipation - Continue docusate sodium capsule 200 mg oral 2 times daily - Continue MiraLAX 17 g oral daily - Continue Senokot tablet 8.6 mg oral daily as needed for constipation  Overactive bladder - Continue Ditropan XL 24-hour tablet 10 mg p.o. at bedtime  Supplementation - Continue Vitamin C tablet 500 mg oral daily  GERD - Continue Protonix EC tablet 40 mg daily  Anxiety/agitation - Continue hydroxyzine tablet 50 mg oral every 6 hours as needed - Continue olanzapine tablet 10 mg oral every 6 hours as needed for agitation - Continue ziprasidone injection 20 mg IM every 8 hours as needed for agitation  Smoking cessation/nicotine replacement - Continue nicotine gum 2 mg oral as needed  Insomnia -Continue trazodone 100 mg tab oral at bedtime  PRN, Other  --Continue Tylenol 650 mg po every 6 hrs prn pain --Continue MAALOX/MYLANTA 30 mL po every 4 hrs prn indigestion --Continue Milk of Magnesia 30 mL po daily prn constipation       Jesse Sans, MD 02/12/2021, 11:46 AM

## 2021-02-12 NOTE — Progress Notes (Signed)
Patient  required encouragement for blood work. Denies SI/HI/A/VH  support and encouragement provided as needed.  Refused am miralax stating he did not need it. Patient educated on medications.    No adverse drug reactions noted. Patient contracts for safety at this time. Will continue to monitor Patient.

## 2021-02-12 NOTE — Progress Notes (Signed)
  Chaplain On-Call received a call from [facility] Unit with the report of patient's request for prayer.  Chaplain met with the patient in the Group Room.  Chaplain provided much listening and spiritual and emotional support. The patient described his faith journey which includes both Muslim and [place] components.  Chaplain provided prayer with the patient, who stated his appreciation.  Chaplain Pollyann Samples M.Div., [facility]

## 2021-02-13 NOTE — Progress Notes (Signed)
Patient isolative to self. Noted in dayroom coloring, minimal interaction with staff and peers. Medication compliant. Quiet . Denies all. Encouragement and support provided. Safety checks maintained. Medications given as prescribed. Pt receptive and remains safe on unit with q 15 min checks.
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
## 2021-02-13 NOTE — Plan of Care (Signed)
  Problem: Coping: Goal: Ability to identify and develop effective coping behavior will improve Outcome: Progressing   Problem: Self-Concept: Goal: Level of anxiety will decrease Outcome: Progressing   Problem: Health Behavior/Discharge Planning: Goal: Compliance with therapeutic regimen will improve Outcome: Progressing

## 2021-02-13 NOTE — Group Note (Signed)
[facility] LCSW Group Therapy Note   Group Date: 02/13/2021 Start Time: 1300 End Time: 1400  Type of Therapy and Topic:  Group Therapy:  Feelings around Relapse and Recovery  Participation Level:  Did Not Attend   Mood:  Description of Group:    Patients in this group will discuss emotions they experience before and after a relapse. They will process how experiencing these feelings, or avoidance of experiencing them, relates to having a relapse. Facilitator will guide patients to explore emotions they have related to recovery. Patients will be encouraged to process which emotions are more powerful. They will be guided to discuss the emotional reaction significant others in their lives may have to patients' relapse or recovery. Patients will be assisted in exploring ways to respond to the emotions of others without this contributing to a relapse.  Therapeutic Goals: Patient will identify two or more emotions that lead to relapse for them:  Patient will identify two emotions that result when they relapse:  Patient will identify two emotions related to recovery:  Patient will demonstrate ability to communicate their needs through discussion and/or role plays.   Summary of Patient Progress: Patient did not attend group despite encouraged participation.    Therapeutic Modalities:   Cognitive Behavioral Therapy Solution-Focused Therapy Assertiveness Training Relapse Prevention Therapy   Corky Crafts, [place]

## 2021-02-13 NOTE — Progress Notes (Signed)
Recreation Therapy Notes   Date: 02/13/2021  Time: 10:00 am   Location: Craft room    Behavioral response: N/A   Intervention Topic: Stress Management     Discussion/Intervention: Patient did not attend group.   Clinical Observations/Feedback:  Patient did not attend group.     LRT/CTRS           02/13/2021 12:30 PM

## 2021-02-13 NOTE — Progress Notes (Signed)
Awake, alert, oriented x 3. Ate snack in the day room and socializing with peers. Pleasant, denies SI, AVH. Reports he is having HI, does not disclose who, but denies he is having HI about anyone in the facility. Denies pain. Remains safe on the unit with q15 min safety checks.

## 2021-02-13 NOTE — Plan of Care (Signed)
Patient got up for breakfast and medications then returned to bed. Presented to the medication room reporting that he was feeling anxious and irritated and requested his PRN Zyprexa. Also received Ibuprofen for back pain. Alert and oriented X4. Endorsing passive SI as evidenced by " I don't even know...". Patient contracts for safety.  Received medications and returned to bed. Has not been participating in group activities. Safety monitored per unit protocol.

## 2021-02-14 MED ORDER — CLOZAPINE 25 MG PO TABS
150.0000 mg | ORAL_TABLET | Freq: Every day | ORAL | Status: DC
  Administered 2021-02-14 – 2021-02-16 (×3): 150 mg via ORAL
  Filled 2021-02-14 (×3): qty 1

## 2021-02-14 MED ORDER — OLANZAPINE 5 MG PO TBDP
20.0000 mg | ORAL_TABLET | Freq: Every day | ORAL | Status: DC
  Administered 2021-02-14 – 2021-02-16 (×3): 20 mg via ORAL
  Filled 2021-02-14 (×2): qty 4

## 2021-02-14 NOTE — Group Note (Signed)
[facility] LCSW Group Therapy Note   Group Date: 02/14/2021 Start Time: 1300 End Time: 1400   Type of Therapy and Topic: Group Therapy: Avoiding Self-Sabotaging and Enabling Behaviors  Participation Level: Did Not Attend  Mood: X  Description of Group:  In this group, patients will learn how to identify obstacles, self-sabotaging and enabling behaviors, as well as: what are they, why do we do them and what needs these behaviors meet. Discuss unhealthy relationships and how to have positive healthy boundaries with those that sabotage and enable. Explore aspects of self-sabotage and enabling in yourself and how to limit these self-destructive behaviors in everyday life.   Therapeutic Goals: 1. Patient will identify one obstacle that relates to self-sabotage and enabling behaviors 2. Patient will identify one personal self-sabotaging or enabling behavior they did prior to admission 3. Patient will state a plan to change the above identified behavior 4. Patient will demonstrate ability to communicate their needs through discussion and/or role play.    Summary of Patient Progress: Patient did not attend group despite encouraged participation.    Therapeutic Modalities:  Cognitive Behavioral Therapy Person-Centered Therapy Motivational Interviewing    Norberto Sorenson, Theresia Majors 02/14/2021 4:40PM

## 2021-02-14 NOTE — Progress Notes (Signed)
Conny became more and more cooperative and pleasant toward the evening. More visible in the milieu, smiling.

## 2021-02-14 NOTE — Plan of Care (Signed)
Mostly in bed but was out for medications and meals. Flat and guarded on approach. Irritable. Denies SI/HI/AVH. Went back to his room and asked not to be disturbed. Was encouraged to call staff as needed.

## 2021-02-14 NOTE — Progress Notes (Signed)
Awake, alert, oriented x 3. No acute distress noted at this time. Sitting in dining room with peer and mental health tech playing cards. Patient denies SI/HI at present time. Reports hearing voices but unable to articulate what he is hearing. Remains safe on the unit with q15 min safety checks.

## 2021-02-14 NOTE — Progress Notes (Signed)
[facility] MD Progress Note  02/14/2021 1:49 PM Lawrence Morgan  MRN:  [id number] Subjective: Follow-up for this 40 year old man with schizoaffective disorder.  He tells me that he is not feeling very good.  He says he is still having intrusive thoughts of violence and having auditory hallucinations.  He asked me whether it would be possible to be transferred to [facility].  His speech was a bit confused and I thought at first that he was asking me whether he could be on the medicine clozapine but he is actually already on that medicine.  He is not doing anything violent or aggressive and mostly stays to himself laying in a darkened room.  Last lithium level was checked 2 days ago and was 1.04 Principal Problem: Schizoaffective disorder, bipolar type (HCC) Diagnosis: Principal Problem:   Schizoaffective disorder, bipolar type (HCC) Active Problems:   Chronic constipation   GERD (gastroesophageal reflux disease)   Ingestion of foreign body  Total Time spent with patient: 30 minutes  Past Psychiatric History: Patient has a long history of schizoaffective disorder  Past Medical History: History reviewed. No pertinent past medical history.  Past Surgical History:  Procedure Laterality Date   ESOPHAGOGASTRODUODENOSCOPY (EGD) WITH PROPOFOL N/A 01/20/2021   Procedure: ESOPHAGOGASTRODUODENOSCOPY (EGD) WITH PROPOFOL;  Surgeon: Midge Minium, MD;  Location: [facility] ENDOSCOPY;  Service: Endoscopy;  Laterality: N/A;   Family History: History reviewed. No pertinent family history. Family Psychiatric  History: See previous Social History:  Social History   Substance and Sexual Activity  Alcohol Use Never     Social History   Substance and Sexual Activity  Drug Use Never    Social History   Socioeconomic History   Marital status: Single    Spouse name: Not on file   Number of children: Not on file   Years of education: Not on file   Highest education level: Not on file  Occupational  History   Not on file  Tobacco Use   Smoking status: Every Day    Packs/day: 1.50    Types: Cigarettes   Smokeless tobacco: Never  Vaping Use   Vaping Use: Some days   Substances: Nicotine  Substance and Sexual Activity   Alcohol use: Never   Drug use: Never   Sexual activity: Not Currently  Other Topics Concern   Not on file  Social History Narrative   Not on file   Social Determinants of Health   Financial Resource Strain: Not on file  Food Insecurity: Not on file  Transportation Needs: Not on file  Physical Activity: Not on file  Stress: Not on file  Social Connections: Not on file   Additional Social History:                         Sleep: Fair  Appetite:  Fair  Current Medications: Current Facility-Administered Medications  Medication Dose Route Frequency Provider Last Rate Last Admin   acetaminophen (TYLENOL) tablet 650 mg  650 mg Oral Q6H PRN ,  T, MD   650 mg at 01/29/21 0754   alum & mag hydroxide-simeth (MAALOX/MYLANTA) 200-200-20 MG/5ML suspension 30 mL  30 mL Oral Q4H PRN ,  T, MD       ascorbic acid (VITAMIN C) tablet 500 mg  500 mg Oral Daily Jesse Sans, MD   500 mg at 02/14/21 2952   benztropine (COGENTIN) tablet 0.5 mg  0.5 mg Oral BID , [name], MD   0.5 mg  at 02/14/21 0831   cloZAPine (CLOZARIL) tablet 150 mg  150 mg Oral QHS ,  T, MD       docusate sodium (COLACE) capsule 200 mg  200 mg Oral BID ,  T, MD   200 mg at 02/14/21 0831   hydrOXYzine (ATARAX/VISTARIL) tablet 50 mg  50 mg Oral Q6H PRN ,  T, MD   50 mg at 02/07/21 2059   ibuprofen (ADVIL) tablet 600 mg  600 mg Oral Q6H PRN Jesse Sans, MD   600 mg at 02/13/21 0806   lidocaine (LIDODERM) 5 % 1 patch  1 patch Transdermal Q24H Jesse Sans, MD   1 patch at 02/13/21 1643   lithium carbonate (ESKALITH) CR tablet 900 mg  900 mg Oral QHS Jesse Sans, MD   900 mg at 02/13/21 2057   lithium carbonate (LITHOBID)  CR tablet 600 mg  600 mg Oral q AM Jesse Sans, MD   600 mg at 02/14/21 2263   magnesium hydroxide (MILK OF MAGNESIA) suspension 30 mL  30 mL Oral Daily PRN , [name], MD       nicotine polacrilex (NICORETTE) gum 2 mg  2 mg Oral PRN Gabriel Cirri F, NP   2 mg at 02/06/21 1408   OLANZapine (ZYPREXA) tablet 10 mg  10 mg Oral Q6H PRN Jesse Sans, MD   10 mg at 02/13/21 0806   OLANZapine zydis (ZYPREXA) disintegrating tablet 20 mg  20 mg Oral QHS ,  T, MD       oxybutynin (DITROPAN-XL) 24 hr tablet 10 mg  10 mg Oral QHS Jesse Sans, MD   10 mg at 02/13/21 2055   paliperidone (INVEGA SUSTENNA) injection 234 mg  234 mg Intramuscular Q28 days Jesse Sans, MD   234 mg at 01/30/21 1015   pantoprazole (PROTONIX) EC tablet 40 mg  40 mg Oral Daily , [name], MD   40 mg at 02/14/21 3354   polyethylene glycol (MIRALAX / GLYCOLAX) packet 17 g  17 g Oral Daily , [name], MD   17 g at 02/14/21 0831   senna (SENOKOT) tablet 8.6 mg  1 tablet Oral Daily PRN Gabriel Cirri F, NP   8.6 mg at 02/07/21 2058   sertraline (ZOLOFT) tablet 100 mg  100 mg Oral Daily , [name], MD   100 mg at 02/14/21 0832   traZODone (DESYREL) tablet 100 mg  100 mg Oral QHS Jesse Sans, MD   100 mg at 02/13/21 2056   traZODone (DESYREL) tablet 50 mg  50 mg Oral QHS PRN Jesse Sans, MD   50 mg at 02/12/21 2047   ziprasidone (GEODON) injection 20 mg  20 mg Intramuscular Q8H PRN Jesse Sans, MD        Lab Results: No results found for this or any previous visit (from the past 48 hour(s)).  Blood Alcohol level:  Lab Results  Component Value Date   ETH <10 01/20/2021    Metabolic Disorder Labs: Lab Results  Component Value Date   HGBA1C 5.7 (H) 01/22/2021   MPG 116.89 01/22/2021   No results found for: PROLACTIN Lab Results  Component Value Date   CHOL 176 01/22/2021   TRIG 179 (H) 01/22/2021   HDL 47 01/22/2021   CHOLHDL 3.7 01/22/2021   VLDL 36  01/22/2021   LDLCALC 93 01/22/2021    Physical Findings: AIMS:  , ,  ,  ,    CIWA:  COWS:     Musculoskeletal: Strength & Muscle Tone: within normal limits Gait & Station: normal Patient leans: N/A  Psychiatric Specialty Exam:  Presentation  General Appearance: Appropriate for Environment  Eye Contact:Good  Speech:Clear and Coherent; Normal Rate  Speech Volume:Normal  Handedness:Right   Mood and Affect  Mood:Euthymic  Affect:Congruent   Thought Process  Thought Processes:Coherent  Descriptions of Associations:Loose  Orientation:Full (Time, Place and Person)  Thought Content:Rumination  History of Schizophrenia/Schizoaffective disorder:Yes  Duration of Psychotic Symptoms:Greater than six months  Hallucinations:No data recorded Ideas of Reference:Paranoia  Suicidal Thoughts:No data recorded Homicidal Thoughts:No data recorded  Sensorium  Memory:Immediate Good  Judgment:Impaired  Insight:Poor   Executive Functions  Concentration:Fair  Attention Span:Fair  Recall:Fair  Fund of Knowledge:Fair  Language:Fair   Psychomotor Activity  Psychomotor Activity: No data recorded  Assets  Assets:Desire for Improvement; Financial Resources/Insurance; Housing; Resilience; Physical Health; Social Support   Sleep  Sleep: No data recorded   Physical Exam: Physical Exam Vitals and nursing note reviewed.  Constitutional:      Appearance: Normal appearance.  HENT:     Head: Normocephalic and atraumatic.     Mouth/Throat:     Pharynx: Oropharynx is clear.  Eyes:     Pupils: Pupils are equal, round, and reactive to light.  Cardiovascular:     Rate and Rhythm: Normal rate and regular rhythm.  Pulmonary:     Effort: Pulmonary effort is normal.     Breath sounds: Normal breath sounds.  Abdominal:     General: Abdomen is flat.     Palpations: Abdomen is soft.  Musculoskeletal:        General: Normal range of motion.  Skin:    General:  Skin is warm and dry.  Neurological:     General: No focal deficit present.     Mental Status: He is alert. Mental status is at baseline.  Psychiatric:        Attention and Perception: He perceives auditory hallucinations.        Mood and Affect: Mood normal. Affect is blunt.        Speech: Speech is delayed.        Behavior: Behavior is withdrawn.        Thought Content: Thought content includes homicidal ideation. Thought content does not include homicidal plan.   Review of Systems  Constitutional: Negative.   HENT: Negative.    Eyes: Negative.   Respiratory: Negative.    Cardiovascular: Negative.   Gastrointestinal: Negative.   Musculoskeletal: Negative.   Skin: Negative.   Neurological: Negative.   Psychiatric/Behavioral:  Positive for depression and hallucinations. The patient is nervous/anxious.   Blood pressure 120/84, pulse 86, temperature 97.8 F (36.6 C), temperature source Oral, resp. rate 18, height 5\' 10"  (1.778 m), weight 90.7 kg, SpO2 100 %. Body mass index is 28.7 kg/m.   Treatment Plan Summary: Medication management and Plan patient with schizoaffective disorder who is on clozapine Zyprexa and lithium with a therapeutic blood level.  Despite this still complaining of intrusive hospital thoughts and hallucinations and continuing to behave like a person with active psychotic symptoms.  I had been told previously that he was essentially waiting for placement but he looks like he is perhaps more symptomatic.  I am increasing his clozapine to 150 mg at night and the olanzapine to 20 mg at night.  We will probably want to recheck the lithium minute couple days to make sure it is not getting higher and getting toxic.  Encourage patient in attending groups and engaging in therapeutic activity.  Mordecai Rasmussen, MD 02/14/2021, 1:49 PM

## 2021-02-15 NOTE — Progress Notes (Signed)
[facility] MD Progress Note  02/15/2021 12:18 PM Lawrence Morgan  MRN:  [id number] Subjective: Follow-up for this 40 year old man with schizoaffective disorder.  Patient continues to complain of hallucinations along with suicidal and homicidal ideation.  Mostly stays withdrawn in bed.  Also complaining of back pain.  Affect blunted.  Staff also reporting the same symptoms.  Tired a little bit but generally tolerating medicine. Principal Problem: Schizoaffective disorder, bipolar type (HCC) Diagnosis: Principal Problem:   Schizoaffective disorder, bipolar type (HCC) Active Problems:   Chronic constipation   GERD (gastroesophageal reflux disease)   Ingestion of foreign body  Total Time spent with patient: 30 minutes  Past Psychiatric History: Past history of schizoaffective disorder on clozapine  Past Medical History: History reviewed. No pertinent past medical history.  Past Surgical History:  Procedure Laterality Date   ESOPHAGOGASTRODUODENOSCOPY (EGD) WITH PROPOFOL N/A 01/20/2021   Procedure: ESOPHAGOGASTRODUODENOSCOPY (EGD) WITH PROPOFOL;  Surgeon: Midge Minium, MD;  Location: [facility] ENDOSCOPY;  Service: Endoscopy;  Laterality: N/A;   Family History: History reviewed. No pertinent family history. Family Psychiatric  History: See previous Social History:  Social History   Substance and Sexual Activity  Alcohol Use Never     Social History   Substance and Sexual Activity  Drug Use Never    Social History   Socioeconomic History   Marital status: Single    Spouse name: Not on file   Number of children: Not on file   Years of education: Not on file   Highest education level: Not on file  Occupational History   Not on file  Tobacco Use   Smoking status: Every Day    Packs/day: 1.50    Types: Cigarettes   Smokeless tobacco: Never  Vaping Use   Vaping Use: Some days   Substances: Nicotine  Substance and Sexual Activity   Alcohol use: Never   Drug use: Never   Sexual activity:  Not Currently  Other Topics Concern   Not on file  Social History Narrative   Not on file   Social Determinants of Health   Financial Resource Strain: Not on file  Food Insecurity: Not on file  Transportation Needs: Not on file  Physical Activity: Not on file  Stress: Not on file  Social Connections: Not on file   Additional Social History:                         Sleep: Fair  Appetite:  Fair  Current Medications: Current Facility-Administered Medications  Medication Dose Route Frequency Provider Last Rate Last Admin   acetaminophen (TYLENOL) tablet 650 mg  650 mg Oral Q6H PRN ,  T, MD   650 mg at 01/29/21 0754   alum & mag hydroxide-simeth (MAALOX/MYLANTA) 200-200-20 MG/5ML suspension 30 mL  30 mL Oral Q4H PRN ,  T, MD       ascorbic acid (VITAMIN C) tablet 500 mg  500 mg Oral Daily Les Pou M, MD   500 mg at 02/15/21 0827   benztropine (COGENTIN) tablet 0.5 mg  0.5 mg Oral BID ,  T, MD   0.5 mg at 02/15/21 3299   cloZAPine (CLOZARIL) tablet 150 mg  150 mg Oral QHS ,  T, MD   150 mg at 02/14/21 2129   docusate sodium (COLACE) capsule 200 mg  200 mg Oral BID ,  T, MD   200 mg at 02/15/21 0823   hydrOXYzine (ATARAX/VISTARIL) tablet 50 mg  50 mg Oral Q6H PRN  , [name], MD   50 mg at 02/07/21 2059   ibuprofen (ADVIL) tablet 600 mg  600 mg Oral Q6H PRN Jesse Sans, MD   600 mg at 02/14/21 2055   lidocaine (LIDODERM) 5 % 1 patch  1 patch Transdermal Q24H Jesse Sans, MD   1 patch at 02/13/21 1643   lithium carbonate (ESKALITH) CR tablet 900 mg  900 mg Oral QHS Jesse Sans, MD   900 mg at 02/14/21 2055   lithium carbonate (LITHOBID) CR tablet 600 mg  600 mg Oral q AM Jesse Sans, MD   600 mg at 02/15/21 0831   magnesium hydroxide (MILK OF MAGNESIA) suspension 30 mL  30 mL Oral Daily PRN , [name], MD       nicotine polacrilex (NICORETTE) gum 2 mg  2 mg Oral PRN Gabriel Cirri F, NP    2 mg at 02/14/21 2056   OLANZapine (ZYPREXA) tablet 10 mg  10 mg Oral Q6H PRN Jesse Sans, MD   10 mg at 02/13/21 0806   OLANZapine zydis (ZYPREXA) disintegrating tablet 20 mg  20 mg Oral QHS ,  T, MD   20 mg at 02/14/21 2129   oxybutynin (DITROPAN-XL) 24 hr tablet 10 mg  10 mg Oral QHS Jesse Sans, MD   10 mg at 02/14/21 2059   paliperidone (INVEGA SUSTENNA) injection 234 mg  234 mg Intramuscular Q28 days Jesse Sans, MD   234 mg at 01/30/21 1015   pantoprazole (PROTONIX) EC tablet 40 mg  40 mg Oral Daily , [name], MD   40 mg at 02/15/21 0823   polyethylene glycol (MIRALAX / GLYCOLAX) packet 17 g  17 g Oral Daily , [name], MD   17 g at 02/15/21 6122   senna (SENOKOT) tablet 8.6 mg  1 tablet Oral Daily PRN Gabriel Cirri F, NP   8.6 mg at 02/07/21 2058   sertraline (ZOLOFT) tablet 100 mg  100 mg Oral Daily , [name], MD   100 mg at 02/15/21 0823   traZODone (DESYREL) tablet 100 mg  100 mg Oral QHS Jesse Sans, MD   100 mg at 02/14/21 2053   traZODone (DESYREL) tablet 50 mg  50 mg Oral QHS PRN Jesse Sans, MD   50 mg at 02/12/21 2047   ziprasidone (GEODON) injection 20 mg  20 mg Intramuscular Q8H PRN Jesse Sans, MD        Lab Results: No results found for this or any previous visit (from the past 48 hour(s)).  Blood Alcohol level:  Lab Results  Component Value Date   ETH <10 01/20/2021    Metabolic Disorder Labs: Lab Results  Component Value Date   HGBA1C 5.7 (H) 01/22/2021   MPG 116.89 01/22/2021   No results found for: PROLACTIN Lab Results  Component Value Date   CHOL 176 01/22/2021   TRIG 179 (H) 01/22/2021   HDL 47 01/22/2021   CHOLHDL 3.7 01/22/2021   VLDL 36 01/22/2021   LDLCALC 93 01/22/2021    Physical Findings: AIMS:  , ,  ,  ,    CIWA:    COWS:     Musculoskeletal: Strength & Muscle Tone: within normal limits Gait & Station: normal Patient leans: N/A  Psychiatric Specialty Exam:  Presentation   General Appearance: Appropriate for Environment  Eye Contact:Good  Speech:Clear and Coherent; Normal Rate  Speech Volume:Normal  Handedness:Right   Mood and Affect  Mood:Euthymic  Affect:Congruent   Thought Process  Thought Processes:Coherent  Descriptions of Associations:Loose  Orientation:Full (Time, Place and Person)  Thought Content:Rumination  History of Schizophrenia/Schizoaffective disorder:Yes  Duration of Psychotic Symptoms:Greater than six months  Hallucinations:No data recorded Ideas of Reference:Paranoia  Suicidal Thoughts:No data recorded Homicidal Thoughts:No data recorded  Sensorium  Memory:Immediate Good  Judgment:Impaired  Insight:Poor   Executive Functions  Concentration:Fair  Attention Span:Fair  Recall:Fair  Fund of Knowledge:Fair  Language:Fair   Psychomotor Activity  Psychomotor Activity: No data recorded  Assets  Assets:Desire for Improvement; Financial Resources/Insurance; Housing; Resilience; Physical Health; Social Support   Sleep  Sleep: No data recorded   Physical Exam: Physical Exam Vitals and nursing note reviewed.  Constitutional:      Appearance: Normal appearance.  HENT:     Head: Normocephalic and atraumatic.     Mouth/Throat:     Pharynx: Oropharynx is clear.  Eyes:     Pupils: Pupils are equal, round, and reactive to light.  Cardiovascular:     Rate and Rhythm: Normal rate and regular rhythm.  Pulmonary:     Effort: Pulmonary effort is normal.     Breath sounds: Normal breath sounds.  Abdominal:     General: Abdomen is flat.     Palpations: Abdomen is soft.  Musculoskeletal:        General: Normal range of motion.  Skin:    General: Skin is warm and dry.  Neurological:     General: No focal deficit present.     Mental Status: He is alert. Mental status is at baseline.  Psychiatric:        Attention and Perception: He is inattentive. He perceives auditory hallucinations.        Mood  and Affect: Mood normal. Affect is blunt.        Speech: Speech is delayed.        Behavior: Behavior is withdrawn.        Thought Content: Thought content includes homicidal and suicidal ideation.        Cognition and Memory: Cognition is impaired.   Review of Systems  Constitutional: Negative.   HENT: Negative.    Eyes: Negative.   Respiratory: Negative.    Cardiovascular: Negative.   Gastrointestinal: Negative.   Musculoskeletal: Negative.   Skin: Negative.   Neurological: Negative.   Blood pressure 135/77, pulse 87, temperature 98.4 F (36.9 C), temperature source Oral, resp. rate 18, height 5\' 10"  (1.778 m), weight 90.7 kg, SpO2 100 %. Body mass index is 28.7 kg/m.   Treatment Plan Summary: Medication management and Plan clozapine and Zyprexa doses increased yesterday.  Lithium level due in another couple days at most but was stable recently.  Reassured patient that we were working on adjusting medication.  Encourage group activity in conversation with nursing.  Encourage patient to let know if he is constipated or having pain that needs active treatment.  [place], MD 02/15/2021, 12:18 PM

## 2021-02-15 NOTE — Progress Notes (Signed)
Patient alert and oriented. Patient endorsing SI but denies HI and AVH. Patient contracted for safety. Patient compliant with medication but refused lidocaine patch. Remains safe on the unit with q15 minute safety checks.

## 2021-02-15 NOTE — Group Note (Signed)
LCSW Group Therapy Note  Group Date: 02/15/2021 Start Time: 1300 End Time: 1400   Type of Therapy and Topic:  Group Therapy - How To Cope with Nervousness about Discharge   Participation Level:  Did Not Attend   Description of Group This process group involved identification of patients' feelings about discharge. Some of them are scheduled to be discharged soon, while others are new admissions, but each of them was asked to share thoughts and feelings surrounding discharge from the hospital. One common theme was that they are excited at the prospect of going home, while another was that many of them are apprehensive about sharing why they were hospitalized. Patients were given the opportunity to discuss these feelings with their peers in preparation for discharge.  Therapeutic Goals  Patient will identify their overall feelings about pending discharge. Patient will think about how they might proactively address issues that they believe will once again arise once they get home (i.e. with parents). Patients will participate in discussion about having hope for change.   Summary of Patient Progress:  Patient did not attend group despite encouraged participation.    Therapeutic Modalities Cognitive Behavioral Therapy    K , LCSWA 02/15/2021  3:01 PM   

## 2021-02-16 ENCOUNTER — Ambulatory Visit: Payer: Medicare Other

## 2021-02-16 NOTE — Group Note (Signed)
[facility] LCSW Group Therapy Note    Group Date: 02/16/2021 Start Time: 1300 End Time: 1400  Type of Therapy and Topic:  Group Therapy:  Overcoming Obstacles  Participation Level:  [facility] PARTICIPATION LEVEL: Minimal  Mood: blunted  Description of Group:   In this group patients will be encouraged to explore what they see as obstacles to their own wellness and recovery. They will be guided to discuss their thoughts, feelings, and behaviors related to these obstacles. The group will process together ways to cope with barriers, with attention given to specific choices patients can make. Each patient will be challenged to identify changes they are motivated to make in order to overcome their obstacles. This group will be process-oriented, with patients participating in exploration of their own experiences as well as giving and receiving support and challenge from other group members.  Therapeutic Goals: 1. Patient will identify personal and current obstacles as they relate to admission. 2. Patient will identify barriers that currently interfere with their wellness or overcoming obstacles.  3. Patient will identify feelings, thought process and behaviors related to these barriers. 4. Patient will identify two changes they are willing to make to overcome these obstacles:    Summary of Patient Progress Patient showed up to group w/ 15 minutes remaining. Pt was disruptive and exhibited a lack of insight. Advice given by patient was counterproductive to group goals.    Therapeutic Modalities:   Cognitive Behavioral Therapy Solution Focused Therapy Motivational Interviewing Relapse Prevention Therapy   Corky Crafts, [place]

## 2021-02-16 NOTE — Plan of Care (Signed)
Patient denies anxiety   Problem: Self-Concept: Goal: Level of anxiety will decrease Outcome: Progressing

## 2021-02-16 NOTE — Progress Notes (Signed)
Recreation Therapy Notes   Date: 02/16/2021  Time: 10:00 am   Location: Craft room    Behavioral response: N/A   Intervention Topic: Self-care   Discussion/Intervention: Patient did not attend group.   Clinical Observations/Feedback:  Patient did not attend group.     LRT/CTRS           02/16/2021 12:52 PM

## 2021-02-16 NOTE — Progress Notes (Signed)
   02/16/21 1540  Clinical Encounter Type  Visited With Patient  Visit Type Follow-up;Spiritual support;Social support  Referral From Patient  Consult/Referral To Chaplain  Spiritual Encounters  Spiritual Needs Other (Comment)  Chaplain Burris met with Pt at his request. Chaplain provided mostly social support and encouragement. Pt also seeking materials to stay occupied such as magazines. Chaplain assisted with this need by locating magazines in [employer] for Pt to enjoy. Chaplain plans to follow-up in afternoon on 9/20.

## 2021-02-16 NOTE — Progress Notes (Signed)
Patient has been pleasant. Euthymic. Denies SI HI and AVH. No reported urges to swallow foreign bodies at this time

## 2021-02-16 NOTE — Plan of Care (Signed)
Pt denies depression and anxiety. Pt rates hopelessness 10/10. Pt verbally denies SI, HI and AVH. Pt was educated on care plan and verbalizes understanding. Torrie Mayers RN Problem: Education: Goal: Ability to state activities that reduce stress will improve Outcome: Progressing   Problem: Coping: Goal: Ability to identify and develop effective coping behavior will improve Outcome: Progressing   Problem: Self-Concept: Goal: Ability to identify factors that promote anxiety will improve Outcome: Progressing Goal: Level of anxiety will decrease Outcome: Progressing Goal: Ability to modify response to factors that promote anxiety will improve Outcome: Progressing   Problem: Education: Goal: Utilization of techniques to improve thought processes will improve Outcome: Progressing Goal: Knowledge of the prescribed therapeutic regimen will improve Outcome: Progressing   Problem: Activity: Goal: Interest or engagement in leisure activities will improve Outcome: Progressing Goal: Imbalance in normal sleep/wake cycle will improve Outcome: Progressing   Problem: Coping: Goal: Coping ability will improve Outcome: Progressing Goal: Will verbalize feelings Outcome: Progressing   Problem: Health Behavior/Discharge Planning: Goal: Ability to make decisions will improve Outcome: Progressing Goal: Compliance with therapeutic regimen will improve Outcome: Progressing   Problem: Role Relationship: Goal: Will demonstrate positive changes in social behaviors and relationships Outcome: Progressing   Problem: Safety: Goal: Ability to disclose and discuss suicidal ideas will improve Outcome: Progressing Goal: Ability to identify and utilize support systems that promote safety will improve Outcome: Progressing   Problem: Self-Concept: Goal: Will verbalize positive feelings about self Outcome: Progressing Goal: Level of anxiety will decrease Outcome: Progressing   Problem:  Education: Goal: Knowledge of [place] [facility] Education information/materials will improve Outcome: Progressing Goal: Emotional status will improve Outcome: Progressing Goal: Mental status will improve Outcome: Progressing Goal: Verbalization of understanding the information provided will improve Outcome: Progressing   Problem: Activity: Goal: Interest or engagement in activities will improve Outcome: Progressing Goal: Sleeping patterns will improve Outcome: Progressing   Problem: Coping: Goal: Ability to verbalize frustrations and anger appropriately will improve Outcome: Progressing Goal: Ability to demonstrate self-control will improve Outcome: Progressing   Problem: Health Behavior/Discharge Planning: Goal: Identification of resources available to assist in meeting health care needs will improve Outcome: Progressing Goal: Compliance with treatment plan for underlying cause of condition will improve Outcome: Progressing   Problem: Physical Regulation: Goal: Ability to maintain clinical measurements within normal limits will improve Outcome: Progressing   Problem: Safety: Goal: Periods of time without injury will increase Outcome: Progressing

## 2021-02-16 NOTE — Progress Notes (Signed)
[facility] MD Progress Note  02/16/2021 2:56 PM Lawrence Morgan  MRN:  [id number] Subjective: Follow-up for this patient with schizoaffective disorder.  Today he says his mood is much better.  He denies any suicidal ideation and denies hallucinations.  At morning report today other members of the treatment team told me that they believe that his statements about being suicidal were possibly manipulative as he has not shown any problem behavior on the ward.  We still do not know whether the batteries have been passed or not.  Today he was up out of bed a little more interacting with others Principal Problem: Schizoaffective disorder, bipolar type (HCC) Diagnosis: Principal Problem:   Schizoaffective disorder, bipolar type (HCC) Active Problems:   Chronic constipation   GERD (gastroesophageal reflux disease)   Ingestion of foreign body  Total Time spent with patient: 30 minutes  Past Psychiatric History: Past history of recurrent schizoaffective disorder with chronic behavior problems  Past Medical History: History reviewed. No pertinent past medical history.  Past Surgical History:  Procedure Laterality Date   ESOPHAGOGASTRODUODENOSCOPY (EGD) WITH PROPOFOL N/A 01/20/2021   Procedure: ESOPHAGOGASTRODUODENOSCOPY (EGD) WITH PROPOFOL;  Surgeon: Midge Minium, MD;  Location: [facility] ENDOSCOPY;  Service: Endoscopy;  Laterality: N/A;   Family History: History reviewed. No pertinent family history. Family Psychiatric  History: See previous Social History:  Social History   Substance and Sexual Activity  Alcohol Use Never     Social History   Substance and Sexual Activity  Drug Use Never    Social History   Socioeconomic History   Marital status: Single    Spouse name: Not on file   Number of children: Not on file   Years of education: Not on file   Highest education level: Not on file  Occupational History   Not on file  Tobacco Use   Smoking status: Every Day    Packs/day: 1.50    Types:  Cigarettes   Smokeless tobacco: Never  Vaping Use   Vaping Use: Some days   Substances: Nicotine  Substance and Sexual Activity   Alcohol use: Never   Drug use: Never   Sexual activity: Not Currently  Other Topics Concern   Not on file  Social History Narrative   Not on file   Social Determinants of Health   Financial Resource Strain: Not on file  Food Insecurity: Not on file  Transportation Needs: Not on file  Physical Activity: Not on file  Stress: Not on file  Social Connections: Not on file   Additional Social History:                         Sleep: Fair  Appetite:  Fair  Current Medications: Current Facility-Administered Medications  Medication Dose Route Frequency Provider Last Rate Last Admin   acetaminophen (TYLENOL) tablet 650 mg  650 mg Oral Q6H PRN ,  T, MD   650 mg at 01/29/21 0754   alum & mag hydroxide-simeth (MAALOX/MYLANTA) 200-200-20 MG/5ML suspension 30 mL  30 mL Oral Q4H PRN ,  T, MD       ascorbic acid (VITAMIN C) tablet 500 mg  500 mg Oral Daily Les Pou M, MD   500 mg at 02/16/21 0750   benztropine (COGENTIN) tablet 0.5 mg  0.5 mg Oral BID ,  T, MD   0.5 mg at 02/16/21 0749   cloZAPine (CLOZARIL) tablet 150 mg  150 mg Oral QHS , [name], MD   150 mg  at 02/15/21 2058   docusate sodium (COLACE) capsule 200 mg  200 mg Oral BID ,  T, MD   200 mg at 02/16/21 1204   hydrOXYzine (ATARAX/VISTARIL) tablet 50 mg  50 mg Oral Q6H PRN ,  T, MD   50 mg at 02/07/21 2059   ibuprofen (ADVIL) tablet 600 mg  600 mg Oral Q6H PRN Jesse Sans, MD   600 mg at 02/14/21 2055   lidocaine (LIDODERM) 5 % 1 patch  1 patch Transdermal Q24H Jesse Sans, MD   1 patch at 02/13/21 1643   lithium carbonate (ESKALITH) CR tablet 900 mg  900 mg Oral QHS Jesse Sans, MD   900 mg at 02/15/21 2058   lithium carbonate (LITHOBID) CR tablet 600 mg  600 mg Oral q AM Jesse Sans, MD   600 mg at  02/16/21 0751   magnesium hydroxide (MILK OF MAGNESIA) suspension 30 mL  30 mL Oral Daily PRN , [name], MD       nicotine polacrilex (NICORETTE) gum 2 mg  2 mg Oral PRN Gabriel Cirri F, NP   2 mg at 02/14/21 2056   OLANZapine (ZYPREXA) tablet 10 mg  10 mg Oral Q6H PRN Jesse Sans, MD   10 mg at 02/13/21 0806   OLANZapine zydis (ZYPREXA) disintegrating tablet 20 mg  20 mg Oral QHS ,  T, MD   20 mg at 02/15/21 2140   oxybutynin (DITROPAN-XL) 24 hr tablet 10 mg  10 mg Oral QHS Jesse Sans, MD   10 mg at 02/15/21 2059   paliperidone (INVEGA SUSTENNA) injection 234 mg  234 mg Intramuscular Q28 days Jesse Sans, MD   234 mg at 01/30/21 1015   pantoprazole (PROTONIX) EC tablet 40 mg  40 mg Oral Daily , [name], MD   40 mg at 02/16/21 0750   polyethylene glycol (MIRALAX / GLYCOLAX) packet 17 g  17 g Oral Daily , [name], MD   17 g at 02/15/21 1761   senna (SENOKOT) tablet 8.6 mg  1 tablet Oral Daily PRN Gabriel Cirri F, NP   8.6 mg at 02/07/21 2058   sertraline (ZOLOFT) tablet 100 mg  100 mg Oral Daily , [name], MD   100 mg at 02/16/21 0750   traZODone (DESYREL) tablet 100 mg  100 mg Oral QHS Jesse Sans, MD   100 mg at 02/15/21 2059   traZODone (DESYREL) tablet 50 mg  50 mg Oral QHS PRN Jesse Sans, MD   50 mg at 02/12/21 2047   ziprasidone (GEODON) injection 20 mg  20 mg Intramuscular Q8H PRN Jesse Sans, MD        Lab Results: No results found for this or any previous visit (from the past 48 hour(s)).  Blood Alcohol level:  Lab Results  Component Value Date   ETH <10 01/20/2021    Metabolic Disorder Labs: Lab Results  Component Value Date   HGBA1C 5.7 (H) 01/22/2021   MPG 116.89 01/22/2021   No results found for: PROLACTIN Lab Results  Component Value Date   CHOL 176 01/22/2021   TRIG 179 (H) 01/22/2021   HDL 47 01/22/2021   CHOLHDL 3.7 01/22/2021   VLDL 36 01/22/2021   LDLCALC 93 01/22/2021    Physical  Findings: AIMS:  , ,  ,  ,    CIWA:    COWS:     Musculoskeletal: Strength & Muscle Tone: within normal limits Gait &  Station: normal Patient leans: N/A  Psychiatric Specialty Exam:  Presentation  General Appearance: Appropriate for Environment  Eye Contact:Good  Speech:Clear and Coherent; Normal Rate  Speech Volume:Normal  Handedness:Right   Mood and Affect  Mood:Euthymic  Affect:Congruent   Thought Process  Thought Processes:Coherent  Descriptions of Associations:Loose  Orientation:Full (Time, Place and Person)  Thought Content:Rumination  History of Schizophrenia/Schizoaffective disorder:Yes  Duration of Psychotic Symptoms:Greater than six months  Hallucinations:No data recorded Ideas of Reference:Paranoia  Suicidal Thoughts:No data recorded Homicidal Thoughts:No data recorded  Sensorium  Memory:Immediate Good  Judgment:Impaired  Insight:Poor   Executive Functions  Concentration:Fair  Attention Span:Fair  Recall:Fair  Fund of Knowledge:Fair  Language:Fair   Psychomotor Activity  Psychomotor Activity: No data recorded  Assets  Assets:Desire for Improvement; Financial Resources/Insurance; Housing; Resilience; Physical Health; Social Support   Sleep  Sleep: No data recorded   Physical Exam: Physical Exam Vitals and nursing note reviewed.  Constitutional:      Appearance: Normal appearance.  HENT:     Head: Normocephalic and atraumatic.     Mouth/Throat:     Pharynx: Oropharynx is clear.  Eyes:     Pupils: Pupils are equal, round, and reactive to light.  Cardiovascular:     Rate and Rhythm: Normal rate and regular rhythm.  Pulmonary:     Effort: Pulmonary effort is normal.     Breath sounds: Normal breath sounds.  Abdominal:     General: Abdomen is flat.     Palpations: Abdomen is soft.  Musculoskeletal:        General: Normal range of motion.  Skin:    General: Skin is warm and dry.  Neurological:     General:  No focal deficit present.     Mental Status: He is alert. Mental status is at baseline.  Psychiatric:        Attention and Perception: Attention normal.        Mood and Affect: Mood normal. Affect is blunt.        Speech: Speech is delayed.        Behavior: Behavior is slowed.        Thought Content: Thought content normal.        Cognition and Memory: Cognition is impaired.   Review of Systems  Constitutional: Negative.   HENT: Negative.    Eyes: Negative.   Respiratory: Negative.    Cardiovascular: Negative.   Gastrointestinal: Negative.   Musculoskeletal: Negative.   Skin: Negative.   Neurological: Negative.   Psychiatric/Behavioral:  Negative for depression, hallucinations and suicidal ideas. The patient is not nervous/anxious.   Blood pressure 125/80, pulse 89, temperature 98.2 F (36.8 C), temperature source Oral, resp. rate 18, height 5\' 10"  (1.778 m), weight 90.7 kg, SpO2 99 %. Body mass index is 28.7 kg/m.   Treatment Plan Summary: Medication management and Plan patient tolerating medicine including elevated dose of Clozapine.  Vitals stable.  Today he is looking better and not reporting any suicidal or homicidal thoughts.  Reviewed plan with treatment team we may be able to look towards discharge within the next day or so.  I have put in an order for another abdominal x-ray to look for the batteries.  , MD 02/16/2021, 2:56 PM

## 2021-02-16 NOTE — Plan of Care (Signed)
  Problem: Education: Goal: Ability to state activities that reduce stress will improve Outcome: Progressing   Problem: Coping: Goal: Ability to identify and develop effective coping behavior will improve Outcome: Progressing   Problem: Self-Concept: Goal: Ability to identify factors that promote anxiety will improve Outcome: Progressing Goal: Level of anxiety will decrease Outcome: Progressing Goal: Ability to modify response to factors that promote anxiety will improve Outcome: Progressing   Problem: Education: Goal: Utilization of techniques to improve thought processes will improve Outcome: Progressing Goal: Knowledge of the prescribed therapeutic regimen will improve Outcome: Progressing   Problem: Activity: Goal: Interest or engagement in leisure activities will improve Outcome: Progressing Goal: Imbalance in normal sleep/wake cycle will improve Outcome: Progressing   Problem: Coping: Goal: Coping ability will improve Outcome: Progressing Goal: Will verbalize feelings Outcome: Progressing   Problem: Health Behavior/Discharge Planning: Goal: Ability to make decisions will improve Outcome: Progressing Goal: Compliance with therapeutic regimen will improve Outcome: Progressing   Problem: Role Relationship: Goal: Will demonstrate positive changes in social behaviors and relationships Outcome: Progressing   Problem: Safety: Goal: Ability to disclose and discuss suicidal ideas will improve Outcome: Progressing Goal: Ability to identify and utilize support systems that promote safety will improve Outcome: Progressing   Problem: Self-Concept: Goal: Will verbalize positive feelings about self Outcome: Progressing Goal: Level of anxiety will decrease Outcome: Progressing   Problem: Education: Goal: Knowledge of [facility] Education information/materials will improve Outcome: Progressing Goal: Emotional status will improve Outcome: Progressing Goal:  Mental status will improve Outcome: Progressing Goal: Verbalization of understanding the information provided will improve Outcome: Progressing   Problem: Activity: Goal: Interest or engagement in activities will improve Outcome: Progressing Goal: Sleeping patterns will improve Outcome: Progressing   Problem: Coping: Goal: Ability to verbalize frustrations and anger appropriately will improve Outcome: Progressing Goal: Ability to demonstrate self-control will improve Outcome: Progressing   Problem: Health Behavior/Discharge Planning: Goal: Identification of resources available to assist in meeting health care needs will improve Outcome: Progressing Goal: Compliance with treatment plan for underlying cause of condition will improve Outcome: Progressing   Problem: Physical Regulation: Goal: Ability to maintain clinical measurements within normal limits will improve Outcome: Progressing   Problem: Safety: Goal: Periods of time without injury will increase Outcome: Progressing   

## 2021-02-16 NOTE — Progress Notes (Signed)
Pt requested to see the chaplain. Torrie Mayers RN

## 2021-02-16 NOTE — Progress Notes (Signed)
Patient calm and compliant during assessment denying SI/HI/AVH. Pt endorses depression. Pt observed interacting appropiately with staff and peers on the unit. Pt compliant with medication administration per MD orders. Pt given education, support, and encouragement to be active in his treatment plan. Pt being monitored Q 15 minutes for safety per unit protocol. Pt remains safe on the unit.

## 2021-02-17 MED ORDER — PANTOPRAZOLE SODIUM 40 MG PO TBEC: 40.0000 mg | DELAYED_RELEASE_TABLET | Freq: Every day | ORAL | 1 refills | Status: AC

## 2021-02-17 MED ORDER — LITHIUM CARBONATE ER 450 MG PO TBCR: 900.0000 mg | EXTENDED_RELEASE_TABLET | Freq: Every day | ORAL | 1 refills | Status: AC

## 2021-02-17 MED ORDER — SERTRALINE HCL 100 MG PO TABS: 100.0000 mg | ORAL_TABLET | Freq: Every day | ORAL | 1 refills | Status: AC

## 2021-02-17 MED ORDER — OLANZAPINE 20 MG PO TBDP: 20.0000 mg | ORAL_TABLET | Freq: Every day | ORAL | 1 refills | Status: AC

## 2021-02-17 MED ORDER — TRAZODONE HCL 100 MG PO TABS: 100.0000 mg | ORAL_TABLET | Freq: Every day | ORAL | 1 refills | Status: AC

## 2021-02-17 MED ORDER — LITHIUM CARBONATE ER 300 MG PO TBCR: 600.0000 mg | EXTENDED_RELEASE_TABLET | Freq: Every morning | ORAL | 1 refills | Status: DC

## 2021-02-17 MED ORDER — POLYETHYLENE GLYCOL 3350 17 G PO PACK: 17.0000 g | PACK | Freq: Every day | ORAL | 1 refills | Status: DC

## 2021-02-17 MED ORDER — PALIPERIDONE PALMITATE ER 234 MG/1.5ML IM SUSY: 234.0000 mg | PREFILLED_SYRINGE | INTRAMUSCULAR | 1 refills | Status: AC

## 2021-02-17 MED ORDER — OXYBUTYNIN CHLORIDE ER 10 MG PO TB24: 10.0000 mg | ORAL_TABLET | Freq: Every day | ORAL | 1 refills | Status: AC

## 2021-02-17 MED ORDER — LIDOCAINE 5 % EX PTCH: 1.0000 | MEDICATED_PATCH | CUTANEOUS | 1 refills | Status: DC

## 2021-02-17 MED ORDER — HYDROXYZINE HCL 50 MG PO TABS: 50.0000 mg | ORAL_TABLET | Freq: Four times a day (QID) | ORAL | 1 refills | Status: DC | PRN

## 2021-02-17 MED ORDER — BENZTROPINE MESYLATE 0.5 MG PO TABS: 0.5000 mg | ORAL_TABLET | Freq: Two times a day (BID) | ORAL | 1 refills | Status: AC

## 2021-02-17 MED ORDER — CLOZAPINE 50 MG PO TABS: 150.0000 mg | ORAL_TABLET | Freq: Every day | ORAL | 1 refills | Status: DC

## 2021-02-17 MED ORDER — DOCUSATE SODIUM 100 MG PO CAPS: 200.0000 mg | ORAL_CAPSULE | Freq: Two times a day (BID) | ORAL | 1 refills | Status: DC

## 2021-02-17 NOTE — BHH Counselor (Signed)
CSW spoke with the patient's father/legal guardian.  CSW informed father of pt's scheduled discharge.  Father reports no concerns at this time.   Penni Homans, MSW, LCSW 02/17/2021 3:10 PM

## 2021-02-17 NOTE — BHH Counselor (Signed)
CSW attempted to contact guardian, Tawni Carnes [phone]) regarding discharge. Contact unable to be established but detailed message left on voicemail with contact information for follow up.   CSW spoke with Isac Sarna (group home coordinator at [phone]). Pt discharge discussed and [facility] stated that she would call back CSW to inform regarding when they would be able to pick pt up. No other concerns expressed. Contact ended without incident. [facility] called back and stated that they would pick up pt after 4pm on voicemail. CSW texted [facility] to confirm this. No other concerns expressed.  CSW spoke with Almira Coaster ([facility] team lead) regarding discharge. She stated that they would see pt tomorrow probably between 11am and 1pm. No other concerns expressed. Contact ended without incident.   Vilma Meckel. Algis Greenhouse, MSW, LCSW, LCAS 02/17/2021 10:42 AM

## 2021-02-17 NOTE — Progress Notes (Signed)
D: Pt alert and oriented. Pt denies experiencing any pain, SI/HI, or AVH at this time. Pt reports he will be able to keep himself safe when he returns home.   A: Pt received discharge and medication education/information. Pt belongings were returned and signed for at this time.   R: Pt verbalized understanding of discharge and medication education/information.  Pt escorted by staff to medical mall front lobby where pt's was picked up by group home staff.

## 2021-02-17 NOTE — BHH Suicide Risk Assessment (Signed)
[facility] Discharge Suicide Risk Assessment   Principal Problem: Schizoaffective disorder, bipolar type (HCC) Discharge Diagnoses: Principal Problem:   Schizoaffective disorder, bipolar type (HCC) Active Problems:   Chronic constipation   GERD (gastroesophageal reflux disease)   Ingestion of foreign body   Total Time spent with patient: 45 minutes  Musculoskeletal: Strength & Muscle Tone: within normal limits Gait & Station: normal Patient leans: N/A  Psychiatric Specialty Exam  Presentation  General Appearance: Appropriate for Environment  Eye Contact:Good  Speech:Clear and Coherent; Normal Rate  Speech Volume:Normal  Handedness:Right   Mood and Affect  Mood:Euthymic  Duration of Depression Symptoms: Less than two weeks  Affect:Congruent   Thought Process  Thought Processes:Coherent  Descriptions of Associations:Loose  Orientation:Full (Time, Place and Person)  Thought Content:Rumination  History of Schizophrenia/Schizoaffective disorder:Yes  Duration of Psychotic Symptoms:Greater than six months  Hallucinations:No data recorded Ideas of Reference:Paranoia  Suicidal Thoughts:No data recorded Homicidal Thoughts:No data recorded  Sensorium  Memory:Immediate Good  Judgment:Impaired  Insight:Poor   Executive Functions  Concentration:Fair  Attention Span:Fair  Recall:Fair  Fund of Knowledge:Fair  Language:Fair   Psychomotor Activity  Psychomotor Activity: No data recorded  Assets  Assets:Desire for Improvement; Financial Resources/Insurance; Housing; Resilience; Physical Health; Social Support   Sleep  Sleep: No data recorded  Physical Exam: Physical Exam Vitals and nursing note reviewed.  Constitutional:      Appearance: Normal appearance.  HENT:     Head: Normocephalic and atraumatic.     Mouth/Throat:     Pharynx: Oropharynx is clear.  Eyes:     Pupils: Pupils are equal, round, and reactive to light.  Cardiovascular:      Rate and Rhythm: Normal rate and regular rhythm.  Pulmonary:     Effort: Pulmonary effort is normal.     Breath sounds: Normal breath sounds.  Abdominal:     General: Abdomen is flat.     Palpations: Abdomen is soft.  Musculoskeletal:        General: Normal range of motion.  Skin:    General: Skin is warm and dry.  Neurological:     General: No focal deficit present.     Mental Status: He is alert. Mental status is at baseline.  Psychiatric:        Mood and Affect: Mood normal.        Thought Content: Thought content normal.   Review of Systems  Constitutional: Negative.   HENT: Negative.    Eyes: Negative.   Respiratory: Negative.    Cardiovascular: Negative.   Gastrointestinal: Negative.   Musculoskeletal: Negative.   Skin: Negative.   Neurological: Negative.   Psychiatric/Behavioral: Negative.    Blood pressure (!) 149/84, pulse 93, temperature 97.8 F (36.6 C), temperature source Oral, resp. rate 18, height 5\' 10"  (1.778 m), weight 90.7 kg, SpO2 99 %. Body mass index is 28.7 kg/m.  Mental Status Per Nursing Assessment::   On Admission:  NA  Demographic Factors:  Male and Low socioeconomic status  Loss Factors: NA  Historical Factors: Impulsivity  Risk Reduction Factors:   Living with another person, especially a relative and Positive therapeutic relationship  Continued Clinical Symptoms:  Schizophrenia:   Paranoid or undifferentiated type  Cognitive Features That Contribute To Risk:  Loss of executive function    Suicide Risk:  Minimal: No identifiable suicidal ideation.  Patients presenting with no risk factors but with morbid ruminations; may be classified as minimal risk based on the severity of the depressive symptoms    Plan Of  Care/Follow-up recommendations:  Other:    At discharge the patient is denying any suicidal ideation.  Behavior has been calm and appropriate without any dangerous behavior during hospital stay.  He has not swallowed any  foreign bodies again in many days now.  Patient denies suicidal ideation and agrees to outpatient follow-up and medication management.  Mordecai Rasmussen, MD 02/17/2021, 9:59 AM

## 2021-02-17 NOTE — Group Note (Signed)
[facility] LCSW Group Therapy Note   Group Date: 02/17/2021 Start Time: 1310 End Time: 1440  Type of Therapy/Topic:  Group Therapy:  Feelings about Diagnosis  Participation Level:  Minimal    Description of Group:    This group will allow patients to explore their thoughts and feelings about diagnoses they have received. Patients will be guided to explore their level of understanding and acceptance of these diagnoses. Facilitator will encourage patients to process their thoughts and feelings about the reactions of others to their diagnosis, and will guide patients in identifying ways to discuss their diagnosis with significant others in their lives. This group will be process-oriented, with patients participating in exploration of their own experiences as well as giving and receiving support and challenge from other group members.   Therapeutic Goals: 1. Patient will demonstrate understanding of diagnosis as evidence by identifying two or more symptoms of the disorder:  2. Patient will be able to express two feelings regarding the diagnosis 3. Patient will demonstrate ability to communicate their needs through discussion and/or role plays  Summary of Patient Progress: Pt was present for the entirety of group. He participated in the icebreaker but outside of that was quiet for the rest of group.    Therapeutic Modalities:   Cognitive Behavioral Therapy Brief Therapy Feelings Identification    Glenis Smoker, LCSW

## 2021-02-17 NOTE — Progress Notes (Signed)
D: Pt alert and oriented. Pt rates depression 5/10, hopelessness 7/10, and anxiety 5/10. Pt goal: "have a nice day." Pt reports energy level as normal and concentration as being good. Pt reports sleep last night as being good. Pt did/not receive medications for sleep and did/not find them helpful. Pt reports/denies experiencing any pain at this time. Pt denies/reports experiencing any SI/HI, or AVH at this time.   A: Scheduled medications administered to pt, per MD orders. Support and encouragement provided. Frequent verbal contact made. Routine safety checks conducted q15 minutes.   R: No adverse drug reactions noted. Pt verbally contracts for safety at this time. Pt complaint with medications and treatment plan. Pt interacts well with others on the unit. Pt remains safe at this time. Will continue to monitor.

## 2021-02-17 NOTE — Progress Notes (Signed)
   02/17/21 1330  Clinical Encounter Type  Visited With Patient  Visit Type Follow-up;Spiritual support;Social support  [employer] visited with Pt. We discussed plans for discharge today. Chaplain offered words of encouragement and prayer.

## 2021-02-17 NOTE — [place] FL2 (Signed)
MEDICAID FL2 LEVEL OF CARE SCREENING TOOL     IDENTIFICATION  Patient Name: Lawrence Morgan Birthdate: 1980/06/17 Sex: male Admission Date (Current Location): 01/21/2021  [facility] and [place] Number:  Chiropodist and Address:         Provider Number: [id number]  Attending Physician Name and Address:  Jesse Sans, MD  Relative Name and Phone Number:       Current Level of Care: Hospital Recommended Level of Care: Assisted Living Facility, Family Care Home, Other (Comment) (Group Home) Prior Approval Number:    Date Approved/Denied:   PASRR Number:    Discharge Plan: Other (Comment) (Assisted Living Facility, [facility], Group Home)    Current Diagnoses: Patient Active Problem List   Diagnosis Date Noted   Schizoaffective disorder, bipolar type (HCC) 01/21/2021   Chronic constipation 01/20/2021   GERD (gastroesophageal reflux disease) 01/20/2021   Ingestion of foreign body 01/20/2021    Orientation RESPIRATION BLADDER Height & Weight     Self, Time, Situation, Place  Normal Continent Weight: 200 lb (90.7 kg) Height:  5\' 10"  (177.8 cm)  BEHAVIORAL SYMPTOMS/MOOD NEUROLOGICAL BOWEL NUTRITION STATUS   (NA)  (NA) Continent Diet (Regular)  AMBULATORY STATUS COMMUNICATION OF NEEDS Skin   Independent Verbally Normal                       Personal Care Assistance Level of Assistance   (NA)           Functional Limitations Info   (NA)          SPECIAL CARE FACTORS FREQUENCY   (NA)                    Contractures Contractures Info: Not present    Additional Factors Info  Code Status, Allergies Code Status Info: Full Allergies Info: Shellfish, Haldol           Current Medications (02/17/2021):  This is the current hospital active medication list Current Facility-Administered Medications  Medication Dose Route Frequency Provider Last Rate Last Admin   acetaminophen (TYLENOL) tablet 650 mg  650 mg Oral Q6H PRN  [name], [name], MD   650 mg at 01/29/21 0754   alum & mag hydroxide-simeth (MAALOX/MYLANTA) 200-200-20 MG/5ML suspension 30 mL  30 mL Oral Q4H PRN [name], [name], MD       ascorbic acid (VITAMIN C) tablet 500 mg  500 mg Oral Daily 12-07-2000 M, MD   500 mg at 02/17/21 02/19/21   benztropine (COGENTIN) tablet 0.5 mg  0.5 mg Oral BID [name], [name], MD   0.5 mg at 02/17/21 0803   cloZAPine (CLOZARIL) tablet 150 mg  150 mg Oral QHS [name], [name], MD   150 mg at 02/16/21 2045   docusate sodium (COLACE) capsule 200 mg  200 mg Oral BID [name], [name], MD   200 mg at 02/17/21 0802   hydrOXYzine (ATARAX/VISTARIL) tablet 50 mg  50 mg Oral Q6H PRN [name], [name], MD   50 mg at 02/07/21 2059   ibuprofen (ADVIL) tablet 600 mg  600 mg Oral Q6H PRN 2060, MD   600 mg at 02/14/21 2055   lidocaine (LIDODERM) 5 % 1 patch  1 patch Transdermal Q24H 2056, MD   1 patch at 02/17/21 1259   lithium carbonate (ESKALITH) CR tablet 900 mg  900 mg Oral QHS 02/19/21, MD   900 mg at  02/16/21 2045   lithium carbonate (LITHOBID) CR tablet 600 mg  600 mg Oral q AM Jesse Sans, MD   600 mg at 02/17/21 0802   magnesium hydroxide (MILK OF MAGNESIA) suspension 30 mL  30 mL Oral Daily PRN [name], [name], MD       nicotine polacrilex (NICORETTE) gum 2 mg  2 mg Oral PRN Gabriel Cirri F, NP   2 mg at 02/14/21 2056   OLANZapine (ZYPREXA) tablet 10 mg  10 mg Oral Q6H PRN Jesse Sans, MD   10 mg at 02/13/21 0806   OLANZapine zydis (ZYPREXA) disintegrating tablet 20 mg  20 mg Oral QHS [name], [name], MD   20 mg at 02/16/21 2045   oxybutynin (DITROPAN-XL) 24 hr tablet 10 mg  10 mg Oral QHS Jesse Sans, MD   10 mg at 02/16/21 2045   paliperidone (INVEGA SUSTENNA) injection 234 mg  234 mg Intramuscular Q28 days Jesse Sans, MD   234 mg at 01/30/21 1015   pantoprazole (PROTONIX) EC tablet 40 mg  40 mg Oral Daily [name], [name], MD   40 mg at 02/17/21 0803   polyethylene glycol  (MIRALAX / GLYCOLAX) packet 17 g  17 g Oral Daily [name], [name], MD   17 g at 02/17/21 0803   senna (SENOKOT) tablet 8.6 mg  1 tablet Oral Daily PRN Gabriel Cirri F, NP   8.6 mg at 02/07/21 2058   sertraline (ZOLOFT) tablet 100 mg  100 mg Oral Daily [name], [name], MD   100 mg at 02/17/21 0803   traZODone (DESYREL) tablet 100 mg  100 mg Oral QHS Jesse Sans, MD   100 mg at 02/16/21 2045   traZODone (DESYREL) tablet 50 mg  50 mg Oral QHS PRN Jesse Sans, MD   50 mg at 02/12/21 2047   ziprasidone (GEODON) injection 20 mg  20 mg Intramuscular Q8H PRN Jesse Sans, MD         Discharge Medications: Please see discharge summary for a list of discharge medications.  Relevant Imaging Results:  Relevant Lab Results:   Additional Information Tawni Carnes, father/guardian, [phone]  Harden Mo, LCSW

## 2021-02-17 NOTE — Discharge Summary (Addendum)
Physician Discharge Summary Note  Patient:  Lawrence Morgan is an 40 y.o., male MRN:  [id number] DOB:  Mar 07, 1981 Patient phone:  [phone] (home)  Patient address:   [address] [facility] [place] [address],  Total Time spent with patient: 45 minutes  Date of Admission:  01/21/2021 Date of Discharge: 02/17/2021  Reason for Admission: Admitted for worsening psychosis and behavior.  Patient making statements about wanting to chop off people's heads.  Swallowing batteries.  Principal Problem: Schizoaffective disorder, bipolar type [facility]) Discharge Diagnoses: Principal Problem:   Schizoaffective disorder, bipolar type (HCC) Active Problems:   Chronic constipation   GERD (gastroesophageal reflux disease)   Ingestion of foreign body   Past Psychiatric History: History of schizoaffective disorder with recurrent psychosis resistant to treatment.  History of swallowing foreign bodies as a symptom  Past Medical History: History reviewed. No pertinent past medical history.  Past Surgical History:  Procedure Laterality Date   ESOPHAGOGASTRODUODENOSCOPY (EGD) WITH PROPOFOL N/A 01/20/2021   Procedure: ESOPHAGOGASTRODUODENOSCOPY (EGD) WITH PROPOFOL;  Surgeon: Midge Minium, MD;  Location: [facility] ENDOSCOPY;  Service: Endoscopy;  Laterality: N/A;   Family History: History reviewed. No pertinent family history. Family Psychiatric  History: See previous Social History:  Social History   Substance and Sexual Activity  Alcohol Use Never     Social History   Substance and Sexual Activity  Drug Use Never    Social History   Socioeconomic History   Marital status: Single    Spouse name: Not on file   Number of children: Not on file   Years of education: Not on file   Highest education level: Not on file  Occupational History   Not on file  Tobacco Use   Smoking status: Every Day    Packs/day: 1.50    Types: Cigarettes   Smokeless tobacco: Never   Vaping Use   Vaping Use: Some days   Substances: Nicotine  Substance and Sexual Activity   Alcohol use: Never   Drug use: Never   Sexual activity: Not Currently  Other Topics Concern   Not on file  Social History Narrative   Not on file   Social Determinants of Health   Financial Resource Strain: Not on file  Food Insecurity: Not on file  Transportation Needs: Not on file  Physical Activity: Not on file  Stress: Not on file  Social Connections: Not on file    Hospital Course: Admitted to the psychiatric ward.  Maintained on 15-minute checks.  Patient continued to display psychotic symptoms.  Frequently stated having homicidal or suicidal thoughts.  Did not however engage in any violence.  On 1 occasion he did swallowed batteries that he got from a television remote control.  He has been monitored with x-rays and have now passed.  Meanwhile continued on psychiatric medicines including Clozapine and Zyprexa and lithium and Zoloft.  Gradually showing improvement in symptoms.  Not displaying active behavior indicative of psychosis.  At discharge denies suicidal ideation and denies hallucinations and denies homicidal ideation.  Agrees to follow-up in the community and continued medication management.  Has a group home already set up with outpatient treatment  Physical Findings: AIMS:  , ,  ,  ,    CIWA:    COWS:     Musculoskeletal: Strength & Muscle Tone: within normal limits Gait & Station: normal Patient leans: N/A   Psychiatric Specialty Exam:  Presentation  General Appearance: Appropriate for Environment  Eye Contact:Good  Speech:Clear and Coherent;  Normal Rate  Speech Volume:Normal  Handedness:Right   Mood and Affect  Mood:Euthymic  Affect:Congruent   Thought Process  Thought Processes:Coherent  Descriptions of Associations:Loose  Orientation:Full (Time, Place and Person)  Thought Content:Rumination  History of Schizophrenia/Schizoaffective  disorder:Yes  Duration of Psychotic Symptoms:Greater than six months  Hallucinations:No data recorded Ideas of Reference:Paranoia  Suicidal Thoughts:No data recorded Homicidal Thoughts:No data recorded  Sensorium  Memory:Immediate Good  Judgment:Impaired  Insight:Poor   Executive Functions  Concentration:Fair  Attention Span:Fair  Recall:Fair  Fund of Knowledge:Fair  Language:Fair   Psychomotor Activity  Psychomotor Activity: No data recorded  Assets  Assets:Desire for Improvement; Financial Resources/Insurance; Housing; Resilience; Physical Health; Social Support   Sleep  Sleep: No data recorded   Physical Exam: Physical Exam Vitals and nursing note reviewed.  Constitutional:      Appearance: Normal appearance.  HENT:     Head: Normocephalic and atraumatic.     Mouth/Throat:     Pharynx: Oropharynx is clear.  Eyes:     Pupils: Pupils are equal, round, and reactive to light.  Cardiovascular:     Rate and Rhythm: Normal rate and regular rhythm.  Pulmonary:     Effort: Pulmonary effort is normal.     Breath sounds: Normal breath sounds.  Abdominal:     General: Abdomen is flat.     Palpations: Abdomen is soft.  Musculoskeletal:        General: Normal range of motion.  Skin:    General: Skin is warm and dry.  Neurological:     General: No focal deficit present.     Mental Status: He is alert. Mental status is at baseline.  Psychiatric:        Mood and Affect: Mood normal.        Thought Content: Thought content normal.   Review of Systems  Constitutional: Negative.   HENT: Negative.    Eyes: Negative.   Respiratory: Negative.    Cardiovascular: Negative.   Gastrointestinal: Negative.   Musculoskeletal: Negative.   Skin: Negative.   Neurological: Negative.   Psychiatric/Behavioral: Negative.    Blood pressure (!) 149/84, pulse 93, temperature 97.8 F (36.6 C), temperature source Oral, resp. rate 18, height 5\' 10"  (1.778 m), weight 90.7  kg, SpO2 99 %. Body mass index is 28.7 kg/m.   Social History   Tobacco Use  Smoking Status Every Day   Packs/day: 1.50   Types: Cigarettes  Smokeless Tobacco Never   Tobacco Cessation:  N/A, patient does not currently use tobacco products   Blood Alcohol level:  Lab Results  Component Value Date   ETH <10 01/20/2021    Metabolic Disorder Labs:  Lab Results  Component Value Date   HGBA1C 5.7 (H) 01/22/2021   MPG 116.89 01/22/2021   No results found for: PROLACTIN Lab Results  Component Value Date   CHOL 176 01/22/2021   TRIG 179 (H) 01/22/2021   HDL 47 01/22/2021   CHOLHDL 3.7 01/22/2021   VLDL 36 01/22/2021   LDLCALC 93 01/22/2021    See Psychiatric Specialty Exam and Suicide Risk Assessment completed by Attending Physician prior to discharge.  Discharge destination:  Home  Is patient on multiple antipsychotic therapies at discharge:  No   Has Patient had three or more failed trials of antipsychotic monotherapy by history:  Yes,   Antipsychotic medications that previously failed include:   1.  Clozapine., 2.  Zyprexa., and 3.  Haldol.  Recommended Plan for Multiple Antipsychotic Therapies: Second antipsychotic is Clozapine.  Reason for adding Clozapine lack of response to single antipsychotic alone  Discharge Instructions     Diet - low sodium heart healthy   Complete by: As directed    Increase activity slowly   Complete by: As directed       Allergies as of 02/17/2021       Reactions   Haldol [haloperidol Lactate] Other (See Comments)   Unknown   Shellfish Allergy Itching, Rash        Medication List     STOP taking these medications    famotidine 20 MG tablet Commonly known as: PEPCID   fluvoxaMINE 50 MG tablet Commonly known as: LUVOX   lithium 300 MG tablet Replaced by: lithium carbonate 300 MG CR tablet   OLANZapine 15 MG tablet Commonly known as: ZYPREXA Replaced by: OLANZapine zydis 20 MG disintegrating tablet       TAKE  these medications      Indication  benztropine 0.5 MG tablet Commonly known as: COGENTIN Take 1 tablet (0.5 mg total) by mouth 2 (two) times daily. What changed:  medication strength how much to take  Indication: Extrapyramidal Reaction caused by Medications   clozapine 50 MG tablet Commonly known as: CLOZARIL Take 3 tablets (150 mg total) by mouth at bedtime. What changed:  medication strength how much to take when to take this  Indication: Schizophrenia that does Not Respond to Usual Drug Therapy   docusate sodium 100 MG capsule Commonly known as: COLACE Take 2 capsules (200 mg total) by mouth 2 (two) times daily.  Indication: Constipation   hydrOXYzine 50 MG tablet Commonly known as: ATARAX/VISTARIL Take 1 tablet (50 mg total) by mouth every 6 (six) hours as needed for anxiety.  Indication: Feeling Anxious   lidocaine 5 % Commonly known as: LIDODERM Place 1 patch onto the skin daily. Remove & Discard patch within 12 hours or as directed by MD  Indication: Chronic pain   lithium carbonate 450 MG CR tablet Commonly known as: ESKALITH Take 2 tablets (900 mg total) by mouth at bedtime.  Indication: Schizoaffective Disorder   lithium carbonate 300 MG CR tablet Commonly known as: LITHOBID Take 2 tablets (600 mg total) by mouth in the morning. Start taking on: February 18, 2021 Replaces: lithium 300 MG tablet  Indication: Schizoaffective Disorder   OLANZapine zydis 20 MG disintegrating tablet Commonly known as: ZYPREXA Take 1 tablet (20 mg total) by mouth at bedtime. Replaces: OLANZapine 15 MG tablet  Indication: Schizophrenia   oxybutynin 10 MG 24 hr tablet Commonly known as: DITROPAN-XL Take 1 tablet (10 mg total) by mouth at bedtime. What changed: when to take this  Indication: Bedwetting, Overactive Bladder   paliperidone 234 MG/1.5ML Susy injection Commonly known as: INVEGA SUSTENNA Inject 234 mg into the muscle every 28 (twenty-eight) days. Start  taking on: February 26, 2021  Indication: Schizoaffective Disorder   pantoprazole 40 MG tablet Commonly known as: PROTONIX Take 1 tablet (40 mg total) by mouth daily. Start taking on: February 18, 2021  Indication: Gastroesophageal Reflux Disease   polyethylene glycol 17 g packet Commonly known as: MIRALAX / GLYCOLAX Take 17 g by mouth daily. Start taking on: February 18, 2021  Indication: Constipation   sertraline 100 MG tablet Commonly known as: ZOLOFT Take 1 tablet (100 mg total) by mouth daily. Start taking on: February 18, 2021  Indication: Schizoaffective disorder   traZODone 100 MG tablet Commonly known as: DESYREL Take 1 tablet (100 mg total) by mouth at bedtime.  Indication:  Trouble Sleeping         Follow-up recommendations:  Other:    Continue current medication management.  Follow-up with outpatient mental health provider.  Patient strongly encouraged to stop swallowing batteries and other foreign bodies.  Return to group home for living.  Comments: Prescriptions provided at discharge  Signed: Mordecai Rasmussen, MD 02/17/2021, 10:09 AM

## 2021-02-17 NOTE — BH IP Treatment Plan (Signed)
Interdisciplinary Treatment and Diagnostic Plan Update  02/17/2021 Time of Session: 8:30AM Lawrence Morgan MRN: [id number]  Principal Diagnosis: Schizoaffective disorder, bipolar type [facility])  Secondary Diagnoses: Principal Problem:   Schizoaffective disorder, bipolar type (HCC) Active Problems:   Chronic constipation   GERD (gastroesophageal reflux disease)   Ingestion of foreign body   Current Medications:  Current Facility-Administered Medications  Medication Dose Route Frequency Provider Last Rate Last Admin   acetaminophen (TYLENOL) tablet 650 mg  650 mg Oral Q6H PRN [name], [name], MD   650 mg at 01/29/21 0754   alum & mag hydroxide-simeth (MAALOX/MYLANTA) 200-200-20 MG/5ML suspension 30 mL  30 mL Oral Q4H PRN [name], [name], MD       ascorbic acid (VITAMIN C) tablet 500 mg  500 mg Oral Daily Les Pou M, MD   500 mg at 02/17/21 3016   benztropine (COGENTIN) tablet 0.5 mg  0.5 mg Oral BID [name], [name], MD   0.5 mg at 02/17/21 0109   cloZAPine (CLOZARIL) tablet 150 mg  150 mg Oral QHS [name], [name], MD   150 mg at 02/16/21 2045   docusate sodium (COLACE) capsule 200 mg  200 mg Oral BID [name], [name], MD   200 mg at 02/17/21 0802   hydrOXYzine (ATARAX/VISTARIL) tablet 50 mg  50 mg Oral Q6H PRN [name], [name], MD   50 mg at 02/17/21 0804   ibuprofen (ADVIL) tablet 600 mg  600 mg Oral Q6H PRN Jesse Sans, MD   600 mg at 02/14/21 2055   lidocaine (LIDODERM) 5 % 1 patch  1 patch Transdermal Q24H Jesse Sans, MD   1 patch at 02/13/21 1643   lithium carbonate (ESKALITH) CR tablet 900 mg  900 mg Oral QHS Jesse Sans, MD   900 mg at 02/16/21 2045   lithium carbonate (LITHOBID) CR tablet 600 mg  600 mg Oral q AM Jesse Sans, MD   600 mg at 02/17/21 0802   magnesium hydroxide (MILK OF MAGNESIA) suspension 30 mL  30 mL Oral Daily PRN [name], [name], MD       nicotine polacrilex (NICORETTE) gum 2 mg  2 mg Oral PRN Gabriel Cirri F, NP   2 mg at 02/14/21 2056    OLANZapine (ZYPREXA) tablet 10 mg  10 mg Oral Q6H PRN Jesse Sans, MD   10 mg at 02/13/21 0806   OLANZapine zydis (ZYPREXA) disintegrating tablet 20 mg  20 mg Oral QHS [name], [name], MD   20 mg at 02/16/21 2045   oxybutynin (DITROPAN-XL) 24 hr tablet 10 mg  10 mg Oral QHS Jesse Sans, MD   10 mg at 02/16/21 2045   paliperidone (INVEGA SUSTENNA) injection 234 mg  234 mg Intramuscular Q28 days Jesse Sans, MD   234 mg at 01/30/21 1015   pantoprazole (PROTONIX) EC tablet 40 mg  40 mg Oral Daily [name], [name], MD   40 mg at 02/17/21 0803   polyethylene glycol (MIRALAX / GLYCOLAX) packet 17 g  17 g Oral Daily [name], [name], MD   17 g at 02/17/21 0803   senna (SENOKOT) tablet 8.6 mg  1 tablet Oral Daily PRN Gabriel Cirri F, NP   8.6 mg at 02/07/21 2058   sertraline (ZOLOFT) tablet 100 mg  100 mg Oral Daily [name], [name], MD   100 mg at 02/17/21 0803   traZODone (DESYREL) tablet 100 mg  100 mg Oral QHS Les Pou M,  MD   100 mg at 02/16/21 2045   traZODone (DESYREL) tablet 50 mg  50 mg Oral QHS PRN Jesse Sans, MD   50 mg at 02/12/21 2047   ziprasidone (GEODON) injection 20 mg  20 mg Intramuscular Q8H PRN Jesse Sans, MD       PTA Medications: Medications Prior to Admission  Medication Sig Dispense Refill Last Dose   benztropine (COGENTIN) 1 MG tablet Take 1 mg by mouth 2 (two) times daily.      cloZAPine (CLOZARIL) 100 MG tablet Take 500 mg by mouth at bedtime.      famotidine (PEPCID) 20 MG tablet Take 20 mg by mouth daily.      fluvoxaMINE (LUVOX) 50 MG tablet Take 50 mg by mouth at bedtime.      lithium 300 MG tablet Take 600 mg by mouth 2 (two) times daily.      OLANZapine (ZYPREXA) 15 MG tablet Take 15 mg by mouth at bedtime.      oxybutynin (DITROPAN-XL) 10 MG 24 hr tablet Take 10 mg by mouth daily.       Patient Stressors: Health problems Other: SI/HI/AVH  Patient Strengths: Barrister's clerk for treatment/growth  Treatment  Modalities: Medication Management, Group therapy, Case management,  1 to 1 session with clinician, Psychoeducation, Recreational therapy.   Physician Treatment Plan for Primary Diagnosis: Schizoaffective disorder, bipolar type (HCC) Long Term Goal(s): Improvement in symptoms so as ready for discharge   Short Term Goals: Ability to identify changes in lifestyle to reduce recurrence of condition will improve Ability to disclose and discuss suicidal ideas Ability to demonstrate self-control will improve Ability to identify and develop effective coping behaviors will improve Ability to maintain clinical measurements within normal limits will improve Compliance with prescribed medications will improve  Medication Management: Evaluate patient's response, side effects, and tolerance of medication regimen.  Therapeutic Interventions: 1 to 1 sessions, Unit Group sessions and Medication administration.  Evaluation of Outcomes: Adequate for Discharge  Physician Treatment Plan for Secondary Diagnosis: Principal Problem:   Schizoaffective disorder, bipolar type (HCC) Active Problems:   Chronic constipation   GERD (gastroesophageal reflux disease)   Ingestion of foreign body  Long Term Goal(s): Improvement in symptoms so as ready for discharge   Short Term Goals: Ability to identify changes in lifestyle to reduce recurrence of condition will improve Ability to disclose and discuss suicidal ideas Ability to demonstrate self-control will improve Ability to identify and develop effective coping behaviors will improve Ability to maintain clinical measurements within normal limits will improve Compliance with prescribed medications will improve     Medication Management: Evaluate patient's response, side effects, and tolerance of medication regimen.  Therapeutic Interventions: 1 to 1 sessions, Unit Group sessions and Medication administration.  Evaluation of Outcomes: Adequate for  Discharge   RN Treatment Plan for Primary Diagnosis: Schizoaffective disorder, bipolar type (HCC) Long Term Goal(s): Knowledge of disease and therapeutic regimen to maintain health will improve  Short Term Goals: Ability to demonstrate self-control, Ability to participate in decision making will improve, Ability to verbalize feelings will improve, Ability to disclose and discuss suicidal ideas, Ability to identify and develop effective coping behaviors will improve, and Compliance with prescribed medications will improve  Medication Management: RN will administer medications as ordered by provider, will assess and evaluate patient's response and provide education to patient for prescribed medication. RN will report any adverse and/or side effects to prescribing provider.  Therapeutic Interventions: 1 on 1 counseling sessions, Psychoeducation, Medication  administration, Evaluate responses to treatment, Monitor vital signs and CBGs as ordered, Perform/monitor CIWA, COWS, AIMS and Fall Risk screenings as ordered, Perform wound care treatments as ordered.  Evaluation of Outcomes: Adequate for Discharge   LCSW Treatment Plan for Primary Diagnosis: Schizoaffective disorder, bipolar type (HCC) Long Term Goal(s): Safe transition to appropriate next level of care at discharge, Engage patient in therapeutic group addressing interpersonal concerns.  Short Term Goals: Engage patient in aftercare planning with referrals and resources, Increase social support, Increase ability to appropriately verbalize feelings, Increase emotional regulation, Facilitate acceptance of mental health diagnosis and concerns, and Increase skills for wellness and recovery  Therapeutic Interventions: Assess for all discharge needs, 1 to 1 time with Social worker, Explore available resources and support systems, Assess for adequacy in community support network, Educate family and significant other(s) on suicide prevention, Complete  Psychosocial Assessment, Interpersonal group therapy.  Evaluation of Outcomes: Adequate for Discharge   Progress in Treatment: Attending groups: Yes. Participating in groups: Yes. Taking medication as prescribed: Yes. Toleration medication: Yes. Family/Significant other contact made: Yes, individual(s) contacted:  Tawni Carnes, guardian Patient understands diagnosis: Yes. Discussing patient identified problems/goals with staff: Yes. Medical problems stabilized or resolved: Yes. Denies suicidal/homicidal ideation: Yes. Issues/concerns per patient self-inventory: No. Other: none.  New problem(s) identified: No, Describe:  none.  New Short Term/Long Term Goal(s): elimination of symptoms of psychosis, medication management for mood stabilization; development of comprehensive mental wellness plan. Update 01/28/21: None  02/02/21: None Update 02/02/2021: Continue to work toward elimination of symptoms of psychosis, medication management for mood stabilization; elimination of SI thoughts; development of comprehensive mental wellness/sobriety plan. Update 02/12/2021: No changes at this time. Update 02/17/21: No changes at this time.   Patient Goals: "Get in contact with my dad and get long-term treatment."  Pt declined to sign treatment team form. Update 01/28/21: None Update 02/02/21: None Update 02/07/2021: No additional goals identified since last update. Patient continues to be preoccupied on getting into [facility]. CSW discussed with patient he is not appropriate for [facility]; redirected patient to returning to community.  Update 02/12/2021: No changes at this time. Update 02/17/21: No changes at this time.   Discharge Plan or Barriers: Pt specifically mentions going to [facility]. He was informed that he did not meet criteria for [facility]'s level of care. CSW will assist pt, along with guardian feedback/approval, with development of an appropriate aftercare/discharge plan. Update 01/28/21: CSW sent referral to [facility] per  pt's request. Guardian was notified. Pt was notified that placement to [facility] is unlikely. CSW was informed by group home that pt can return when psychiatrically cleared.  Update 02/02/21: None Update 02/07/2021: No additional barriers identified since last update.  Update 02/12/2021:  Patient continues to desire to go to [facility], however, is now open to returning to his group home while he waits on the opportunity.  Patient has swallowed batteries over the weekend and physician is monitoring their path. Update 02/17/21: Batteries have passed and pt is set for discharge back to his group home with continue ACTT services through [facility].   Reason for Continuation of Hospitalization: Depression Hallucinations Medication stabilization  Estimated Length of Stay: Discharge set for today.   Scribe for Treatment Team: Glenis Smoker, [place] 02/17/2021 10:31 AM

## 2021-02-17 NOTE — Progress Notes (Signed)
  [facility] Adult Case Management Discharge Plan :  Will you be returning to the same living situation after discharge:  Yes,  pt will be returning to group home. At discharge, do you have transportation home?: Yes,  group home to provide transportation. Do you have the ability to pay for your medications: Yes,  Medicare Part A and B.  Release of information consent forms completed and in the chart;  Patient's signature needed at discharge.  Patient to Follow up at:  Follow-up Information     [facility] Follow up on 02/18/2021.   Why: They will see you tomorrow, 02/18/21 between 11am to 1pm. Thanks! Contact information: [address] [place] [address] [phone]                 Next level of care provider has access to [facility] Link:no  Safety Planning and Suicide Prevention discussed: Yes,  SPE completed with guardian, Tawni Carnes.     Has patient been referred to the Quitline?: N/A patient is not a smoker  Patient has been referred for addiction treatment: N/A  Glenis Smoker, LCSW 02/17/2021, 10:43 AM

## 2021-02-18 NOTE — [facility] Counselor (Signed)
CSW was contacted by Isac Sarna (manager at [facility] [phone]). Broadus John inquired if pt had received his [place] shot and when. CSW provided this information (01/30/21). Broadus John requested a copy of this documentation as it had not been in the discharge paperwork. CSW agreed and received fax number of [phone]. No other concerns expressed. Contact ended without incident.   CSW faxed over medication list.  Vilma Meckel. Algis Greenhouse, MSW, LCSW, LCAS 02/18/2021 3:46 PM

## 2021-07-19 ENCOUNTER — Emergency Department: Payer: Medicare Other

## 2021-07-19 ENCOUNTER — Emergency Department
Admission: EM | Admit: 2021-07-19 | Discharge: 2021-07-21 | Disposition: A | Discharge status: Home / Self Care | Payer: Medicare Other | Attending: Emergency Medicine | Admitting: Emergency Medicine

## 2021-07-19 ENCOUNTER — Encounter: Payer: Self-pay | Admitting: Radiology

## 2021-07-19 ENCOUNTER — Other Ambulatory Visit: Payer: Self-pay

## 2021-07-19 DIAGNOSIS — F250 Schizoaffective disorder, bipolar type: Secondary | ICD-10-CM | POA: Insufficient documentation

## 2021-07-19 DIAGNOSIS — Z1339 Encounter for screening examination for other mental health and behavioral disorders: Secondary | ICD-10-CM | POA: Diagnosis not present

## 2021-07-19 DIAGNOSIS — T189XXA Foreign body of alimentary tract, part unspecified, initial encounter: Secondary | ICD-10-CM | POA: Diagnosis not present

## 2021-07-19 DIAGNOSIS — R443 Hallucinations, unspecified: Secondary | ICD-10-CM | POA: Insufficient documentation

## 2021-07-19 DIAGNOSIS — F209 Schizophrenia, unspecified: Secondary | ICD-10-CM

## 2021-07-19 DIAGNOSIS — X58XXXA Exposure to other specified factors, initial encounter: Secondary | ICD-10-CM | POA: Insufficient documentation

## 2021-07-19 DIAGNOSIS — Z20822 Contact with and (suspected) exposure to covid-19: Secondary | ICD-10-CM | POA: Insufficient documentation

## 2021-07-19 DIAGNOSIS — Z79899 Other long term (current) drug therapy: Secondary | ICD-10-CM | POA: Insufficient documentation

## 2021-07-19 LAB — COMPREHENSIVE METABOLIC PANEL
ALT: 14 U/L (ref 0–44)
AST: 16 U/L (ref 15–41)
Albumin: 4.1 g/dL (ref 3.5–5.0)
Alkaline Phosphatase: 65 U/L (ref 38–126)
Anion gap: 5 (ref 5–15)
BUN: 16 mg/dL (ref 6–20)
CO2: 28 mmol/L (ref 22–32)
Calcium: 9.1 mg/dL (ref 8.9–10.3)
Chloride: 104 mmol/L (ref 98–111)
Creatinine, Ser: 0.98 mg/dL (ref 0.61–1.24)
GFR, Estimated: >60 mL/min (ref >60)
Glucose, Bld: 96 mg/dL (ref 70–99)
Potassium: 4 mmol/L (ref 3.5–5.1)
Sodium: 137 mmol/L (ref 135–145)
Total Bilirubin: 0.5 mg/dL (ref 0.3–1.2)
Total Protein: 7.2 g/dL (ref 6.5–8.1)

## 2021-07-19 LAB — CBC
HCT: 44 % (ref 39.0–52.0)
Hemoglobin: 13.5 g/dL (ref 13.0–17.0)
MCH: 25.7 pg — ABNORMAL LOW (ref 26.0–34.0)
MCHC: 30.7 g/dL (ref 30.0–36.0)
MCV: 83.8 fL (ref 80.0–100.0)
Platelets: 250 10*3/uL (ref 150–400)
RBC: 5.25 MIL/uL (ref 4.22–5.81)
RDW: 13.4 % (ref 11.5–15.5)
WBC: 7.7 10*3/uL (ref 4.0–10.5)
nRBC: 0 % (ref 0.0–0.2)

## 2021-07-19 LAB — ETHANOL: Alcohol, Ethyl (B): <10 mg/dL (ref <10)

## 2021-07-19 LAB — ACETAMINOPHEN LEVEL: Acetaminophen (Tylenol), Serum: <10 ug/mL — ABNORMAL LOW (ref 10–30)

## 2021-07-19 LAB — RESP PANEL BY RT-PCR (FLU A&B, COVID) ARPGX2
Influenza A by PCR: NEGATIVE
Influenza B by PCR: NEGATIVE
SARS Coronavirus 2 by RT PCR: NEGATIVE

## 2021-07-19 LAB — SALICYLATE LEVEL: Salicylate Lvl: <7 mg/dL — ABNORMAL LOW (ref 7.0–30.0)

## 2021-07-19 NOTE — ED Notes (Signed)
Pt going to [facility] now.

## 2021-07-19 NOTE — ED Provider Notes (Signed)
[facility] Provider Note    Event Date/Time   First MD Initiated Contact with Patient 07/19/21 2305     (approximate)   History   Psychiatric Evaluation   HPI  Level V caveat: Limited by intellectual disability  Lawrence Morgan is a 41 y.o. male brought to the ED from group home for mental health evaluation and possible swallowed foreign object.  Patient with a history of schizophrenia, intellectual disability who has a history of swallowing nails and batteries.  Patient states he swallowed a nail but is unsure when he did it.  States it was not today or yesterday.  Denies fever, cough, chest pain, shortness of breath, abdominal pain, nausea or vomiting.  Last BM earlier today which was normal for patients.  Denies active SI/HI.  States he feels like people are stalking him and ripping his head, arms and legs off.     Past Medical History   Past Medical History:  Diagnosis Date   Intellectual disability    mild   Marijuana use    Personality disorder (HCC)    PTSD (post-traumatic stress disorder)    Schizo-affective schizophrenia (HCC) 04/29/2018   Schizoaffective disorder, bipolar type [facility])      Active Problem List   Patient Active Problem List   Diagnosis Date Noted   Schizoaffective disorder, bipolar type (HCC) 01/21/2021   Chronic constipation 01/20/2021   GERD (gastroesophageal reflux disease) 01/20/2021   Ingestion of foreign body 01/20/2021   Ingestion of foreign body, subsequent encounter 09/08/2019   Ingestion of foreign body, initial encounter 04/29/2018   Suicide ideation 04/29/2018   Schizoaffective disorder, bipolar type (HCC)    PTSD (post-traumatic stress disorder)      Past Surgical History   Past Surgical History:  Procedure Laterality Date   ESOPHAGOGASTRODUODENOSCOPY (EGD) WITH PROPOFOL N/A 04/30/2018   Procedure: ESOPHAGOGASTRODUODENOSCOPY (EGD) WITH PROPOFOL;  Surgeon: Kathi Der, MD;  Location: [facility] ENDOSCOPY;   Service: Gastroenterology;  Laterality: N/A;   ESOPHAGOGASTRODUODENOSCOPY (EGD) WITH PROPOFOL N/A 01/20/2021   Procedure: ESOPHAGOGASTRODUODENOSCOPY (EGD) WITH PROPOFOL;  Surgeon: Midge Minium, MD;  Location: [facility] ENDOSCOPY;  Service: Endoscopy;  Laterality: N/A;   FOREIGN BODY REMOVAL N/A 04/30/2018   Procedure: FOREIGN BODY REMOVAL;  Surgeon: Kathi Der, MD;  Location: [facility] ENDOSCOPY;  Service: Gastroenterology;  Laterality: N/A;   FRACTURE SURGERY      r ankle      Home Medications   Prior to Admission medications   Medication Sig Start Date End Date Taking? Authorizing Provider  albuterol (PROVENTIL HFA;VENTOLIN HFA) 108 (90 Base) MCG/ACT inhaler Inhale 2 puffs into the lungs every 6 (six) hours as needed for wheezing or shortness of breath.    [provider]  atomoxetine (STRATTERA) 25 MG capsule Take 25 mg by mouth daily.    [provider]  benztropine (COGENTIN) 0.5 MG tablet Take 0.5 mg by mouth daily.    [provider]  benztropine (COGENTIN) 0.5 MG tablet Take 1 tablet (0.5 mg total) by mouth 2 (two) times daily. 02/17/21   [name], [name], MD  cetirizine (ZYRTEC) 10 MG tablet Take 10 mg by mouth daily.    [provider]  Cholecalciferol (VITAMIN D3) 50 MCG (2000 UT) capsule Take 2,000 Units by mouth daily.    [provider]  cloZAPine (CLOZARIL) 100 MG tablet Take 2.5 tablets (250 mg total) by mouth at bedtime. 05/10/18   Laverna Peace, MD  cloZAPine (CLOZARIL) 50 MG tablet Take 3 tablets (150 mg  total) by mouth at bedtime. 02/17/21   [name], Jackquline DenmarkJohn T, MD  docusate sodium (COLACE) 100 MG capsule Take 2 capsules (200 mg total) by mouth 2 (two) times daily. 02/17/21   [name], Jackquline DenmarkJohn T, MD  Docusate Sodium 100 MG capsule Take 200 mg by mouth 2 (two) times daily.    [provider]  hydrOXYzine (ATARAX/VISTARIL) 25 MG tablet Take 1 tablet (25 mg total) by mouth 3 (three) times daily. 06/05/18   Charm RingsLord, Jamison Y, NP  hydrOXYzine  (ATARAX/VISTARIL) 50 MG tablet Take 1 tablet (50 mg total) by mouth every 6 (six) hours as needed for anxiety. 02/17/21   [name], Jackquline DenmarkJohn T, MD  lidocaine (LIDODERM) 5 % Place 1 patch onto the skin daily. Remove & Discard patch within 12 hours or as directed by MD 02/17/21   [name], Jackquline DenmarkJohn T, MD  lithium carbonate (ESKALITH) 450 MG CR tablet Take 2 tablets (900 mg total) by mouth at bedtime. 02/17/21   [name], Jackquline DenmarkJohn T, MD  lithium carbonate (LITHOBID) 300 MG CR tablet Take 1 tablet (300 mg total) by mouth daily with breakfast. 06/06/18   Charm RingsLord, Jamison Y, NP  lithium carbonate (LITHOBID) 300 MG CR tablet Take 2 tablets (600 mg total) by mouth at bedtime. 06/05/18   Charm RingsLord, Jamison Y, NP  lithium carbonate (LITHOBID) 300 MG CR tablet Take 2 tablets (600 mg total) by mouth in the morning. 02/18/21   [name], Jackquline DenmarkJohn T, MD  OLANZapine zydis (ZYPREXA) 20 MG disintegrating tablet Take 1 tablet (20 mg total) by mouth at bedtime. 02/17/21   [name], Jackquline DenmarkJohn T, MD  omeprazole (PRILOSEC) 20 MG capsule Take 20 mg by mouth 2 (two) times daily before a meal.    [provider]  oxybutynin (DITROPAN-XL) 10 MG 24 hr tablet Take 1 tablet (10 mg total) by mouth at bedtime. 02/17/21   [name], Jackquline DenmarkJohn T, MD  paliperidone (INVEGA SUSTENNA) 234 MG/1.5ML SUSY injection Inject 234 mg into the muscle every 30 (thirty) days.    [provider]  paliperidone (INVEGA SUSTENNA) 234 MG/1.5ML SUSY injection Inject 234 mg into the muscle every 28 (twenty-eight) days. 02/26/21   [name], Jackquline DenmarkJohn T, MD  pantoprazole (PROTONIX) 40 MG tablet Take 1 tablet (40 mg total) by mouth daily. 02/18/21   [name], Jackquline DenmarkJohn T, MD  polyethylene glycol (MIRALAX / GLYCOLAX) 17 g packet Take 17 g by mouth daily. 02/18/21   [name], Jackquline DenmarkJohn T, MD  polyethylene glycol (MIRALAX / GLYCOLAX) packet Take 17 g by mouth daily.    [provider]  sertraline (ZOLOFT) 100 MG tablet Take 100 mg by mouth daily.    [provider]  sertraline (ZOLOFT) 100 MG  tablet Take 1 tablet (100 mg total) by mouth daily. 02/18/21   [name], Jackquline DenmarkJohn T, MD  traZODone (DESYREL) 100 MG tablet Take 1 tablet (100 mg total) by mouth at bedtime. 02/17/21   [name], Jackquline DenmarkJohn T, MD     Allergies  Haldol [haloperidol lactate], Haldol [haloperidol lactate], and Shellfish allergy   Family History  No family history on file.   Physical Exam  Triage Vital Signs: ED Triage Vitals  Enc Vitals Group     BP 07/19/21 2236 (!) 138/93     Pulse Rate 07/19/21 2236 88     Resp 07/19/21 2236 16     Temp 07/19/21 2236 98.8 F (37.1 C)     Temp Source 07/19/21 2236 Oral     SpO2 07/19/21 2236 100 %     Weight 07/19/21 2234 230  lb (104.3 kg)     Height 07/19/21 2234 5\' 9"  (1.753 m)     Head Circumference --      Peak Flow --      Pain Score 07/19/21 2234 10     Pain Loc --      Pain Edu? --      Excl. in GC? --     Updated Vital Signs: BP (!) 138/93    Pulse 88    Temp 98.8 F (37.1 C) (Oral)    Resp 16    Ht 5\' 9"  (1.753 m)    Wt 104.3 kg    SpO2 100%    BMI 33.97 kg/m    General: Asleep, awakened for exam, no distress.  CV:  RRR.  Good peripheral perfusion.  Resp:  Normal effort.  CTA B. Abd:  Nontender to light or deep palpation.  No distention.  Other:  Flat affect.   ED Results / Procedures / Treatments  Labs (all labs ordered are listed, but only abnormal results are displayed) Labs Reviewed  SALICYLATE LEVEL - Abnormal; Notable for the following components:      Result Value   Salicylate Lvl <7.0 (*)    All other components within normal limits  ACETAMINOPHEN LEVEL - Abnormal; Notable for the following components:   Acetaminophen (Tylenol), Serum <10 (*)    All other components within normal limits  CBC - Abnormal; Notable for the following components:   MCH 25.7 (*)    All other components within normal limits  RESP PANEL BY RT-PCR (FLU A&B, COVID) ARPGX2  COMPREHENSIVE METABOLIC PANEL  ETHANOL  URINE DRUG SCREEN, QUALITATIVE ([facility] ONLY)      EKG  None   RADIOLOGY I have independently interpreted and visualized patient's abdominal x-ray, CT abdomen pelvis as well as the radiology interpretation:  Abdominal x-ray: Vertically oriented nail possibly within the ascending colon  CT abdomen pelvis: No perforation.  Official radiology report(s): DG Abdomen 1 View  Result Date: 07/19/2021 CLINICAL DATA:  May have swallowed metal foreign bodies. EXAM: ABDOMEN - 1 VIEW COMPARISON:  Most recent radiograph 02/16/2021 FINDINGS: Vertically oriented nail projects over the right mid abdomen, possibly within the ascending colon, measuring approximately 9 cm. Accurate measurement is difficult due to divided nature of the exam at the level of the nail. No other radiopaque foreign bodies. There is no evidence of free air on the supine views. No bowel dilatation/obstruction. Moderate colonic stool burden. No acute osseous abnormalities. IMPRESSION: 1. Vertically oriented nail projects over the right mid abdomen, possibly within the ascending colon, measuring approximately 9 cm. No other radiopaque foreign bodies. 2. No evidence of bowel perforation on the supine views. Moderate colonic stool burden. Electronically Signed   By: 07/21/2021 M.D.   On: 07/19/2021 23:19   CT Abdomen Pelvis W Contrast  Result Date: 07/20/2021 CLINICAL DATA:  History of schizophrenia with history of swallowing foreign objects, initial encounter EXAM: CT ABDOMEN AND PELVIS WITH CONTRAST TECHNIQUE: Multidetector CT imaging of the abdomen and pelvis was performed using the standard protocol following bolus administration of intravenous contrast. RADIATION DOSE REDUCTION: This exam was performed according to the departmental dose-optimization program which includes automated exposure control, adjustment of the mA and/or kV according to patient size and/or use of iterative reconstruction technique. CONTRAST:  07/21/2021 OMNIPAQUE IOHEXOL 300 MG/ML  SOLN COMPARISON:   11/28/2018 FINDINGS: Lower chest: Minimal pleural effusions are noted Hepatobiliary: No focal liver abnormality is seen. No gallstones, gallbladder wall thickening, or  biliary dilatation. Pancreas: Unremarkable. No pancreatic ductal dilatation or surrounding inflammatory changes. Spleen: Normal in size without focal abnormality. Adrenals/Urinary Tract: Adrenal glands are within normal limits. Kidneys demonstrate a normal enhancement pattern. No obstructive changes are seen. Bladder is decompressed. Stomach/Bowel: No obstructive or inflammatory changes are noted. Previously ingested nail is noted within the ascending colon. No evidence of perforation is noted at this time. The appendix is within normal limits. Small bowel is unremarkable. Stomach is within normal limits. Previously seen foreign bodies from 2020 are no longer identified. Vascular/Lymphatic: No significant vascular findings are present. No enlarged abdominal or pelvic lymph nodes. Reproductive: Prostate is unremarkable. Other: No abdominal wall hernia or abnormality. No abdominopelvic ascites. Musculoskeletal: No acute or significant osseous findings. IMPRESSION: Ingested nail now lies within the ascending colon just above the cecum. No perforation is noted. Previously seen foreign bodies from 2020 are no longer identified. Small pleural effusions are noted. Electronically Signed   By: Alcide Clever M.D.   On: 07/20/2021 00:42     PROCEDURES:  Critical Care performed: No  Procedures   MEDICATIONS ORDERED IN ED: Medications  iohexol (OMNIPAQUE) 300 MG/ML solution 100 mL (100 mLs Intravenous Contrast Given 07/20/21 0025)     IMPRESSION / MDM / ASSESSMENT AND PLAN / ED COURSE  I reviewed the triage vital signs and the nursing notes.                             41 year old schizophrenic presenting with possible swallowed foreign body and hallucinations.  Differential diagnosis includes but is not limited to swallowed foreign object, bowel  perforation, etc.  I have personally reviewed patient's records and see that he had endoscopic removal of battery ingestion 01/20/2021.  I reviewed records for the past 3 years and it does not appear that patient had ex lap for swallowed sharp foreign body.  We will obtain psychiatric order set lab work, KUB.  Keep NPO and reassess.  Clinical Course as of 07/20/21 0659  Mon Jul 20, 2021  0016 Spoke with Dr. Aleen Campi from general surgery who agrees with CT scan.  If CT does not show signs of bowel perforation, recommends serial abdominal exams and x-rays.  Does not recommend administering laxative. [JS]  0101 CT demonstrates no perforation.  Will perform serial abdominal exams and x-rays every 6 hours. [JS]  [id number] Care transferred to Dr. Darnelle Catalan at change of shift.  Patient has repeat x-ray ordered for 7 AM.  He is currently asleep. [JS]    Clinical Course User Index [JS] Irean Hong, MD     FINAL CLINICAL IMPRESSION(S) / ED DIAGNOSES   Final diagnoses:  Foreign body, swallowed, initial encounter  Schizophrenia, unspecified type (HCC)     Rx / DC Orders   ED Discharge Orders     None        Note:  This document was prepared using Dragon voice recognition software and may include unintentional dictation errors.   Irean Hong, MD 07/20/21 [phone]

## 2021-07-19 NOTE — ED Triage Notes (Addendum)
Pt presents here with group home staff for mental health evaluation. Pt states has been swallowing foreign objects. Pt states he swallowed some nails recently. Pt states he sometimes wants to harm himself. Pt states he also has swallowed batteries before and is not sure if he has done it recently. Pt has a history of schizophrenia, states he feels like people are stalking him and have been "ripping my head, my arms and my legs off".

## 2021-07-19 NOTE — ED Notes (Signed)
Pt dressed out, the following items placed in two labeled bags" black jacket, black fleece pull over, red t shirt, black sneakers, white socks, black pants, grey boxers. Lattie Haw ed tech took belongings to quad.

## 2021-07-20 ENCOUNTER — Emergency Department: Payer: Medicare Other

## 2021-07-20 DIAGNOSIS — F250 Schizoaffective disorder, bipolar type: Secondary | ICD-10-CM | POA: Diagnosis not present

## 2021-07-20 MED ORDER — IOHEXOL 300 MG/ML  SOLN
100.0000 mL | Freq: Once | INTRAMUSCULAR | Status: AC | PRN
  Administered 2021-07-20: 100 mL via INTRAVENOUS

## 2021-07-20 NOTE — ED Notes (Signed)
Report received from Andrea, RN including SBAR. Patient alert and oriented, warm and dry, in no acute distress. Patient denies HI, AVH and pain. Patient made aware of Q15 minute rounds and security cameras for their safety. Patient instructed to come to this nurse with needs or concerns.  °
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
## 2021-07-20 NOTE — ED Notes (Signed)
VOL, pend placement, moved to BHU 1

## 2021-07-20 NOTE — ED Provider Notes (Addendum)
Belly film done at 7 AM shows nail still in approximately the same position.  I reviewed the films and agree with the radiologist assessment which is what I said above.   Arnaldo Natal, MD 07/20/21 [phone] ----------------------------------------- 10:12 AM on 07/20/2021 ----------------------------------------- Psych wants to admit but is worried about the nail.  They do not feel comfortable having him downstairs with the nail in place.  Surgery was consulted to see if they would admit but they will not.  They said just follow it as it is already past the most dangerous part.  In the colon it should just work its way out of the abdomen in the stool.  I will also consult medicine.   Arnaldo Natal, MD 07/20/21 1013

## 2021-07-20 NOTE — BH Assessment (Signed)
Referral information for Psychiatric Hospitalization faxed to: ? ?[facility] ([phone]-or- [phone]) ? ?[facility] ([phone]-or- [phone]),  ? ?[facility] ([phone]), ? ?[facility] ([phone]),  ? ?[facility] ([phone] -or- [phone]),  ? ?[facility] ([phone]) ? ?[facility] ([phone]). ? ?[facility] ([phone]) ? ?

## 2021-07-20 NOTE — ED Notes (Signed)
Pt moved to [facility] room 1. This nurse informed pt of the security cameras present in the BHU as well as rules and policies regarding phone use, visitors, meals/snacks and shower use. Pt verbalized understanding.

## 2021-07-20 NOTE — ED Notes (Signed)
Lawrence Morgan from facility called to leave her contact info.  [phone].  States pt is able to return to facility once cleared.

## 2021-07-20 NOTE — ED Notes (Signed)
Dinner provided; pt refused. Asked to use the restroom.

## 2021-07-21 ENCOUNTER — Emergency Department: Payer: Medicare Other

## 2021-07-21 DIAGNOSIS — T189XXA Foreign body of alimentary tract, part unspecified, initial encounter: Secondary | ICD-10-CM

## 2021-07-21 DIAGNOSIS — F250 Schizoaffective disorder, bipolar type: Secondary | ICD-10-CM | POA: Diagnosis not present

## 2021-07-21 LAB — CBC WITH DIFFERENTIAL/PLATELET
Abs Immature Granulocytes: 0.01 10*3/uL (ref 0.00–0.07)
Basophils Absolute: 0 10*3/uL (ref 0.0–0.1)
Basophils Relative: 0 %
Eosinophils Absolute: 0.1 10*3/uL (ref 0.0–0.5)
Eosinophils Relative: 1 %
HCT: 45.3 % (ref 39.0–52.0)
Hemoglobin: 14.3 g/dL (ref 13.0–17.0)
Immature Granulocytes: 0 %
Lymphocytes Relative: 24 %
Lymphs Abs: 1.2 10*3/uL (ref 0.7–4.0)
MCH: 26 pg (ref 26.0–34.0)
MCHC: 31.6 g/dL (ref 30.0–36.0)
MCV: 82.2 fL (ref 80.0–100.0)
Monocytes Absolute: 0.6 10*3/uL (ref 0.1–1.0)
Monocytes Relative: 12 %
Neutro Abs: 3.2 10*3/uL (ref 1.7–7.7)
Neutrophils Relative %: 63 %
Platelets: 228 10*3/uL (ref 150–400)
RBC: 5.51 MIL/uL (ref 4.22–5.81)
RDW: 13.2 % (ref 11.5–15.5)
WBC: 5.1 10*3/uL (ref 4.0–10.5)
nRBC: 0 % (ref 0.0–0.2)

## 2021-07-21 LAB — URINE DRUG SCREEN, QUALITATIVE (ARMC ONLY)
Amphetamines, Ur Screen: NOT DETECTED
Barbiturates, Ur Screen: NOT DETECTED
Benzodiazepine, Ur Scrn: NOT DETECTED
Cannabinoid 50 Ng, Ur ~~LOC~~: NOT DETECTED
Cocaine Metabolite,Ur ~~LOC~~: NOT DETECTED
MDMA (Ecstasy)Ur Screen: NOT DETECTED
Methadone Scn, Ur: NOT DETECTED
Opiate, Ur Screen: NOT DETECTED
Phencyclidine (PCP) Ur S: NOT DETECTED
Tricyclic, Ur Screen: NOT DETECTED

## 2021-07-21 MED ORDER — OLANZAPINE 5 MG PO TBDP: 20.0000 mg | ORAL_TABLET | Freq: Every day | ORAL | Status: DC

## 2021-07-21 MED ORDER — LITHIUM CARBONATE ER 300 MG PO TBCR
600.0000 mg | EXTENDED_RELEASE_TABLET | Freq: Two times a day (BID) | ORAL | Status: DC
Start: 2021-07-21 — End: 2021-07-21

## 2021-07-21 MED ORDER — LITHIUM CARBONATE ER 300 MG PO TBCR
600.0000 mg | EXTENDED_RELEASE_TABLET | Freq: Every morning | ORAL | Status: DC
  Filled 2021-07-21 (×2): qty 2

## 2021-07-21 MED ORDER — LITHIUM CARBONATE ER 450 MG PO TBCR
900.0000 mg | EXTENDED_RELEASE_TABLET | Freq: Every day | ORAL | Status: DC
  Filled 2021-07-21: qty 2

## 2021-07-21 MED ORDER — TRAZODONE HCL 100 MG PO TABS: 100.0000 mg | ORAL_TABLET | Freq: Every day | ORAL | Status: DC

## 2021-07-21 MED ORDER — DOCUSATE SODIUM 100 MG PO CAPS
100.0000 mg | ORAL_CAPSULE | Freq: Two times a day (BID) | ORAL | Status: DC
  Administered 2021-07-21: 100 mg via ORAL
  Filled 2021-07-21: qty 1

## 2021-07-21 MED ORDER — CLOZAPINE 100 MG PO TABS: 50.0000 mg | ORAL_TABLET | Freq: Every day | ORAL | Status: DC

## 2021-07-21 MED ORDER — LITHIUM CARBONATE ER 450 MG PO TBCR: 900.0000 mg | EXTENDED_RELEASE_TABLET | Freq: Every day | ORAL | Status: DC

## 2021-07-21 MED ORDER — HYDROXYZINE HCL 25 MG PO TABS: 50.0000 mg | ORAL_TABLET | Freq: Four times a day (QID) | ORAL | Status: DC | PRN

## 2021-07-21 MED ORDER — LITHIUM CARBONATE ER 300 MG PO TBCR: 600.0000 mg | EXTENDED_RELEASE_TABLET | Freq: Every morning | ORAL | Status: DC

## 2021-07-21 MED ORDER — SERTRALINE HCL 50 MG PO TABS
100.0000 mg | ORAL_TABLET | Freq: Every day | ORAL | Status: DC
  Administered 2021-07-21: 100 mg via ORAL
  Filled 2021-07-21: qty 2

## 2021-07-21 MED ORDER — BENZTROPINE MESYLATE 1 MG PO TABS
0.5000 mg | ORAL_TABLET | Freq: Two times a day (BID) | ORAL | Status: DC
  Administered 2021-07-21: 0.5 mg via ORAL
  Filled 2021-07-21: qty 1

## 2021-07-21 NOTE — ED Provider Notes (Signed)
----------------------------------------- °  4:17 PM on 07/21/2021 ----------------------------------------- X-ray this morning showed the screw which is what it is not a nail is now at the hepatic flexure.  It looks like a deck screw.  I discussed this with Dr. Daleen Squibb who is on-call today.  He recommends just letting it come out.  This is what medicine and surgery recommended a few days ago.  We will discharge him back to his group home.  I will give him strict instructions to follow-up with surgery for x-ray in about a week and see if it passes and to return immediately if he develops belly pain vomiting or nausea.   Arnaldo Natal, MD 07/21/21 [phone]

## 2021-07-21 NOTE — ED Notes (Signed)
Resumed care from [facility] r[facility].  Pt alert.  Pt reports abd pain.  Pt to xray via wheelchair.

## 2021-07-21 NOTE — ED Notes (Signed)
VOL moved to [facility]

## 2021-07-21 NOTE — ED Notes (Signed)
Gave food tray with water. °

## 2021-07-21 NOTE — ED Notes (Signed)
Hospital meal provided.  100% consumed, pt tolerated w/o complaints.  Waste discarded appropriately.   

## 2021-07-21 NOTE — Consult Note (Signed)
Pharmacy Consult - Clozapine     41 yo male ordered clozapine 50 mg QHS This patient's order has been reviewed for prescribing contraindications.    Clozapine [facility] enrollment Verified: Yes [facility] patient ID: [id number] RDA: [id number]    Home Regimen: Clozapine 50 mg QHS  Last dose: 07/19/21   Dose Adjustments This Admission: N/A   Labs: Date    ANC    Submitted? 2/21 3200 Yes       Plan: Clozapine 50 mg QHS Monitor ANC at least weekly while inpatient

## 2021-07-21 NOTE — ED Notes (Addendum)
Spoke with jamie mcdonald at [facility].   They will pick pt up at 1730

## 2021-07-21 NOTE — ED Notes (Addendum)
RN into bathroom with patient. Pt begins screaming and cursing at RN. Flushed multiple times prior to RN being able to see his stool. Pt was well aware of need for RN to see his stool due to retained nail. Pt continues to act belligerent towards both RN and security. Will notify EDP.

## 2021-07-21 NOTE — Discharge Instructions (Addendum)
The screw you swallowed should pass without any problems but there is a small possibility that it could go through of the wall of the colon. If this happens you will have a lot of belly pain and possibly vomiting. You will need to return yo the emergency room at once if this happens.  Otherwise the screw should come out and you can recover it from the toilet.  Please follow-up with Dr. Everlene Farrier the surgeon.  He can check an x-ray in about a week and make sure it is passed out of your system please return for any other problems as well.  Continue your medicines and follow-up with your caregivers.

## 2021-07-21 NOTE — ED Notes (Signed)
Pt transported to Xray by this nurse and security, pt returns to [facility] with no issues. Back to bed at this time.

## 2021-07-21 NOTE — ED Notes (Signed)
Pt to bathroom, would not allow RN to see before flushing. Unsure if pt had BM or only urinated.

## 2021-07-21 NOTE — ED Notes (Signed)
Judge Stall from [facility] called states that when pt is ready for D/C he will need a new FL-2 before returning to the home.  He may be contacted at [phone].

## 2021-07-21 NOTE — Consult Note (Signed)
[facility] Face-to-Face Psychiatry Consult   Reason for Consult:  swallowed nail Referring Physician:  EDP Patient Identification: Lawrence Morgan MRN:  [id number] Principal Diagnosis: Ingestion of foreign body, initial encounter Diagnosis:  Principal Problem:   Ingestion of foreign body, initial encounter   Total Time spent with patient: 45 minutes  Subjective:  Smiling, patient states "I come her to see you." Lawrence Morgan is a 41 y.o. male patient admitted with swallowed a nail.  HPI: Patient seen today and chart reviewed.  Patient has been in the ED for more than 24 hours, waiting for foreign object that he swallowed to past.  X-ray reveals that it is still in his colon.  He is showing no signs of discomfort.  Patient is alert and oriented, at his baseline and psychiatrically.  Patient has not allowed staff to continue to examine his bowel movements for a foreign object.  Patient will have an x-ray at 1500 today to determine if object is still there.  Patient has not been acting out in any way except for when staff tried to look at his bowel movement.  Patient denying any thoughts of harming himself or anyone else.  Patient denying any thoughts of killing himself.  Denies having any feelings of people "ripping his arms and legs off" like was described in triage note on 07/19/2021.  Patient has been smiling and laughing appropriately for him, at baseline. Does not require psychiatric hospitalization  Writer spoke with group home, he states they are willing to come and get him whenever he is ready to go.  Past Psychiatric History: schizophrenia, hx of swallowing foreign objects  Risk to Self:   Risk to Others:   Prior Inpatient Therapy:   Prior Outpatient Therapy:    Past Medical History:  Past Medical History:  Diagnosis Date   Intellectual disability    mild   Marijuana use    Personality disorder (HCC)    PTSD (post-traumatic stress disorder)    Schizo-affective schizophrenia (HCC)  04/29/2018   Schizoaffective disorder, bipolar type (HCC)     Past Surgical History:  Procedure Laterality Date   ESOPHAGOGASTRODUODENOSCOPY (EGD) WITH PROPOFOL N/A 04/30/2018   Procedure: ESOPHAGOGASTRODUODENOSCOPY (EGD) WITH PROPOFOL;  Surgeon: Kathi Der, MD;  Location: [facility] ENDOSCOPY;  Service: Gastroenterology;  Laterality: N/A;   ESOPHAGOGASTRODUODENOSCOPY (EGD) WITH PROPOFOL N/A 01/20/2021   Procedure: ESOPHAGOGASTRODUODENOSCOPY (EGD) WITH PROPOFOL;  Surgeon: Midge Minium, MD;  Location: [facility] ENDOSCOPY;  Service: Endoscopy;  Laterality: N/A;   FOREIGN BODY REMOVAL N/A 04/30/2018   Procedure: FOREIGN BODY REMOVAL;  Surgeon: Kathi Der, MD;  Location: [facility] ENDOSCOPY;  Service: Gastroenterology;  Laterality: N/A;   FRACTURE SURGERY      r ankle    Family History: No family history on file. Family Psychiatric  History: unknown Social History:  Social History   Substance and Sexual Activity  Alcohol Use Never     Social History   Substance and Sexual Activity  Drug Use Never   Frequency: 3.0 times per week   Types: Marijuana    Social History   Socioeconomic History   Marital status: Single    Spouse name: Not on file   Number of children: Not on file   Years of education: Not on file   Highest education level: Not on file  Occupational History   Not on file  Tobacco Use   Smoking status: Every Day    Packs/day: 1.50    Types: Cigarettes, Cigars   Smokeless tobacco: Never  Vaping Use  Vaping Use: Some days   Substances: Nicotine  Substance and Sexual Activity   Alcohol use: Never   Drug use: Never    Frequency: 3.0 times per week    Types: Marijuana   Sexual activity: Not Currently  Other Topics Concern   Not on file  Social History Narrative   ** Merged History Encounter **       Social Determinants of Health   Financial Resource Strain: Not on file  Food Insecurity: Not on file  Transportation Needs: Not on file  Physical Activity: Not on  file  Stress: Not on file  Social Connections: Not on file   Additional Social History:    Allergies:   Allergies  Allergen Reactions   Haldol [Haloperidol Lactate]    Haldol [Haloperidol Lactate] Other (See Comments)    Unknown   Shellfish Allergy Itching and Rash    Labs:  Results for orders placed or performed during the hospital encounter of 07/19/21 (from the past 48 hour(s))  Comprehensive metabolic panel     Status: None   Collection Time: 07/19/21 10:40 PM  Result Value Ref Range   Sodium 137 135 - 145 mmol/L   Potassium 4.0 3.5 - 5.1 mmol/L   Chloride 104 98 - 111 mmol/L   CO2 28 22 - 32 mmol/L   Glucose, Bld 96 70 - 99 mg/dL    Comment: Glucose reference range applies only to samples taken after fasting for at least 8 hours.   BUN 16 6 - 20 mg/dL   Creatinine, Ser 1.61 0.61 - 1.24 mg/dL   Calcium 9.1 8.9 - 09.6 mg/dL   Total Protein 7.2 6.5 - 8.1 g/dL   Albumin 4.1 3.5 - 5.0 g/dL   AST 16 15 - 41 U/L   ALT 14 0 - 44 U/L   Alkaline Phosphatase 65 38 - 126 U/L   Total Bilirubin 0.5 0.3 - 1.2 mg/dL   GFR, Estimated >04 >54 mL/min    Comment: (NOTE) Calculated using the CKD-EPI Creatinine Equation (2021)    Anion gap 5 5 - 15    Comment: Performed at [facility], [address] [place], [place] [address]  Ethanol     Status: None   Collection Time: 07/19/21 10:40 PM  Result Value Ref Range   Alcohol, Ethyl (B) <10 <10 mg/dL    Comment: (NOTE) Lowest detectable limit for serum alcohol is 10 mg/dL.  For medical purposes only. Performed at [facility], [address] [place], [place] [address]   Salicylate level     Status: Abnormal   Collection Time: 07/19/21 10:40 PM  Result Value Ref Range   Salicylate Lvl <7.0 (L) 7.0 - 30.0 mg/dL    Comment: Performed at [facility], [address] [place], [place] [address]  Acetaminophen level     Status: Abnormal   Collection Time: 07/19/21 10:40 PM  Result Value Ref Range    Acetaminophen (Tylenol), Serum <10 (L) 10 - 30 ug/mL    Comment: (NOTE) Therapeutic concentrations vary significantly. A range of 10-30 ug/mL  may be an effective concentration for many patients. However, some  are best treated at concentrations outside of this range. Acetaminophen concentrations >150 ug/mL at 4 hours after ingestion  and >50 ug/mL at 12 hours after ingestion are often associated with  toxic reactions.  Performed at [facility], [address], [place], [place] [address]   cbc     Status: Abnormal   Collection Time:  07/19/21 10:40 PM  Result Value Ref Range   WBC 7.7 4.0 - 10.5 K/uL   RBC 5.25 4.22 - 5.81 MIL/uL   Hemoglobin 13.5 13.0 - 17.0 g/dL   HCT 16.144.0 09.639.0 - 04.552.0 %   MCV 83.8 80.0 - 100.0 fL   MCH 25.7 (L) 26.0 - 34.0 pg   MCHC 30.7 30.0 - 36.0 g/dL   RDW 40.913.4 81.111.5 - 91.415.5 %   Platelets 250 150 - 400 K/uL   nRBC 0.0 0.0 - 0.2 %    Comment: Performed at [facility], [address] [place], [place] [address]  Resp Panel by RT-PCR (Flu A&B, Covid) Nasopharyngeal Swab     Status: None   Collection Time: 07/19/21 10:55 PM   Specimen: Nasopharyngeal Swab; Nasopharyngeal(NP) swabs in vial transport medium  Result Value Ref Range   SARS Coronavirus 2 by RT PCR NEGATIVE NEGATIVE    Comment: (NOTE) SARS-CoV-2 target nucleic acids are NOT DETECTED.  The SARS-CoV-2 RNA is generally detectable in upper respiratory specimens during the acute phase of infection. The lowest concentration of SARS-CoV-2 viral copies this assay can detect is 138 copies/mL. A negative result does not preclude SARS-Cov-2 infection and should not be used as the sole basis for treatment or other patient management decisions. A negative result may occur with  improper specimen collection/handling, submission of specimen other than nasopharyngeal swab, presence of viral mutation(s) within the areas targeted by this assay, and inadequate number of viral copies(<138  copies/mL). A negative result must be combined with clinical observations, patient history, and epidemiological information. The expected result is Negative.  Fact Sheet for Patients:  BloggerCourse.comhttps://www.fda.gov/media/152166/download  Fact Sheet for Healthcare Providers:  SeriousBroker.ithttps://www.fda.gov/media/152162/download  This test is no t yet approved or cleared by the [place] FDA and  has been authorized for detection and/or diagnosis of SARS-CoV-2 by FDA under an Emergency Use Authorization (EUA). This EUA will remain  in effect (meaning this test can be used) for the duration of the COVID-19 declaration under Section 564(b)(1) of the Act, 21 U.S.C.section 360bbb-3(b)(1), unless the authorization is terminated  or revoked sooner.       Influenza A by PCR NEGATIVE NEGATIVE   Influenza B by PCR NEGATIVE NEGATIVE    Comment: (NOTE) The Xpert Xpress SARS-CoV-2/FLU/RSV plus assay is intended as an aid in the diagnosis of influenza from Nasopharyngeal swab specimens and should not be used as a sole basis for treatment. Nasal washings and aspirates are unacceptable for Xpert Xpress SARS-CoV-2/FLU/RSV testing.  Fact Sheet for Patients: BloggerCourse.comhttps://www.fda.gov/media/152166/download  Fact Sheet for Healthcare Providers: SeriousBroker.ithttps://www.fda.gov/media/152162/download  This test is not yet approved or cleared by the [place] FDA and has been authorized for detection and/or diagnosis of SARS-CoV-2 by FDA under an Emergency Use Authorization (EUA). This EUA will remain in effect (meaning this test can be used) for the duration of the COVID-19 declaration under Section 564(b)(1) of the Act, 21 U.S.C. section 360bbb-3(b)(1), unless the authorization is terminated or revoked.  Performed at [facility], [address] [place], [place] [address]   Urine Drug Screen, Qualitative     Status: None   Collection Time: 07/21/21 12:19 AM  Result Value Ref Range   Tricyclic, Ur Screen NONE  DETECTED NONE DETECTED   Amphetamines, Ur Screen NONE DETECTED NONE DETECTED   MDMA (Ecstasy)Ur Screen NONE DETECTED NONE DETECTED   Cocaine Metabolite,Ur [place] NONE DETECTED NONE DETECTED   Opiate, Ur Screen NONE DETECTED NONE DETECTED   Phencyclidine (PCP) Ur S  NONE DETECTED NONE DETECTED   Cannabinoid 50 Ng, Ur [place] NONE DETECTED NONE DETECTED   Barbiturates, Ur Screen NONE DETECTED NONE DETECTED   Benzodiazepine, Ur Scrn NONE DETECTED NONE DETECTED   Methadone Scn, Ur NONE DETECTED NONE DETECTED    Comment: (NOTE) Tricyclics + metabolites, urine    Cutoff 1000 ng/mL Amphetamines + metabolites, urine  Cutoff 1000 ng/mL MDMA (Ecstasy), urine              Cutoff 500 ng/mL Cocaine Metabolite, urine          Cutoff 300 ng/mL Opiate + metabolites, urine        Cutoff 300 ng/mL Phencyclidine (PCP), urine         Cutoff 25 ng/mL Cannabinoid, urine                 Cutoff 50 ng/mL Barbiturates + metabolites, urine  Cutoff 200 ng/mL Benzodiazepine, urine              Cutoff 200 ng/mL Methadone, urine                   Cutoff 300 ng/mL  The urine drug screen provides only a preliminary, unconfirmed analytical test result and should not be used for non-medical purposes. Clinical consideration and professional judgment should be applied to any positive drug screen result due to possible interfering substances. A more specific alternate chemical method must be used in order to obtain a confirmed analytical result. Gas chromatography / mass spectrometry (GC/MS) is the preferred confirm atory method. Performed at [facility], [address] [place], [place] [address]   CBC with Differential/Platelet     Status: None   Collection Time: 07/21/21 12:49 PM  Result Value Ref Range   WBC 5.1 4.0 - 10.5 K/uL   RBC 5.51 4.22 - 5.81 MIL/uL   Hemoglobin 14.3 13.0 - 17.0 g/dL   HCT 60.445.3 54.039.0 - 98.152.0 %   MCV 82.2 80.0 - 100.0 fL   MCH 26.0 26.0 - 34.0 pg   MCHC 31.6 30.0 - 36.0 g/dL   RDW  19.113.2 47.811.5 - 29.515.5 %   Platelets 228 150 - 400 K/uL   nRBC 0.0 0.0 - 0.2 %   Neutrophils Relative % 63 %   Neutro Abs 3.2 1.7 - 7.7 K/uL   Lymphocytes Relative 24 %   Lymphs Abs 1.2 0.7 - 4.0 K/uL   Monocytes Relative 12 %   Monocytes Absolute 0.6 0.1 - 1.0 K/uL   Eosinophils Relative 1 %   Eosinophils Absolute 0.1 0.0 - 0.5 K/uL   Basophils Relative 0 %   Basophils Absolute 0.0 0.0 - 0.1 K/uL   Immature Granulocytes 0 %   Abs Immature Granulocytes 0.01 0.00 - 0.07 K/uL    Comment: Performed at [facility], [address] [place], [place] [address]    Current Facility-Administered Medications  Medication Dose Route Frequency Provider Last Rate Last Admin   benztropine (COGENTIN) tablet 0.5 mg  0.5 mg Oral BID Gabriel CirriBarthold,  F, NP       cloZAPine (CLOZARIL) tablet 50 mg  50 mg Oral QHS Gabriel CirriBarthold,  F, NP       docusate sodium (COLACE) capsule 100 mg  100 mg Oral BID Gabriel CirriBarthold,  F, NP       hydrOXYzine (ATARAX) tablet 50 mg  50 mg Oral Q6H PRN Gabriel CirriBarthold,  F, NP       lithium carbonate (ESKALITH) CR tablet 900 mg  900 mg  Oral QHS [name], NP       lithium carbonate (LITHOBID) CR tablet 600 mg  600 mg Oral q morning ,  F, NP       OLANZapine zydis (ZYPREXA) disintegrating tablet 20 mg  20 mg Oral QHS ,  F, NP       sertraline (ZOLOFT) tablet 100 mg  100 mg Oral Daily Gabriel Cirri F, NP       traZODone (DESYREL) tablet 100 mg  100 mg Oral QHS [name], NP       Current Outpatient Medications  Medication Sig Dispense Refill   benztropine (COGENTIN) 0.5 MG tablet Take 1 tablet (0.5 mg total) by mouth 2 (two) times daily. 60 tablet 1   Cholecalciferol (VITAMIN D3) 50 MCG (2000 UT) capsule Take 2,000 Units by mouth daily.     cloZAPine (CLOZARIL) 50 MG tablet Take 3 tablets (150 mg total) by mouth at bedtime. (Patient taking differently: Take 50 mg by mouth at bedtime.) 90 tablet 1   docusate sodium (COLACE) 100  MG capsule Take 2 capsules (200 mg total) by mouth 2 (two) times daily. (Patient taking differently: Take 100 mg by mouth 2 (two) times daily.) 120 capsule 1   ferrous sulfate 325 (65 FE) MG tablet Take 325 mg by mouth daily with breakfast.     hydrOXYzine (ATARAX/VISTARIL) 50 MG tablet Take 1 tablet (50 mg total) by mouth every 6 (six) hours as needed for anxiety. 60 tablet 1   lithium carbonate (ESKALITH) 450 MG CR tablet Take 2 tablets (900 mg total) by mouth at bedtime. 60 tablet 1   lithium carbonate (LITHOBID) 300 MG CR tablet Take 2 tablets (600 mg total) by mouth in the morning. 60 tablet 1   OLANZapine zydis (ZYPREXA) 20 MG disintegrating tablet Take 1 tablet (20 mg total) by mouth at bedtime. 30 tablet 1   oxybutynin (DITROPAN-XL) 10 MG 24 hr tablet Take 1 tablet (10 mg total) by mouth at bedtime. 30 tablet 1   pantoprazole (PROTONIX) 40 MG tablet Take 1 tablet (40 mg total) by mouth daily. 30 tablet 1   polyethylene glycol (MIRALAX / GLYCOLAX) 17 g packet Take 17 g by mouth daily. 30 each 1   sertraline (ZOLOFT) 100 MG tablet Take 1 tablet (100 mg total) by mouth daily. 30 tablet 1   traZODone (DESYREL) 100 MG tablet Take 1 tablet (100 mg total) by mouth at bedtime. 30 tablet 1   albuterol (PROVENTIL HFA;VENTOLIN HFA) 108 (90 Base) MCG/ACT inhaler Inhale 2 puffs into the lungs every 6 (six) hours as needed for wheezing or shortness of breath. (Patient not taking: Reported on 07/20/2021)     atomoxetine (STRATTERA) 25 MG capsule Take 25 mg by mouth daily. (Patient not taking: Reported on 07/20/2021)     benztropine (COGENTIN) 0.5 MG tablet Take 0.5 mg by mouth daily. (Patient not taking: Reported on 07/20/2021)     cetirizine (ZYRTEC) 10 MG tablet Take 10 mg by mouth daily. (Patient not taking: Reported on 07/20/2021)     cloZAPine (CLOZARIL) 100 MG tablet Take 2.5 tablets (250 mg total) by mouth at bedtime. (Patient not taking: Reported on 07/20/2021) 90 tablet 0   Docusate Sodium 100 MG  capsule Take 200 mg by mouth 2 (two) times daily.     hydrOXYzine (ATARAX/VISTARIL) 25 MG tablet Take 1 tablet (25 mg total) by mouth 3 (three) times daily. (Patient not taking: Reported on 07/20/2021) 90 tablet 0   ibuprofen (ADVIL) 600  MG tablet Take 600 mg by mouth every 6 (six) hours as needed.     lidocaine (LIDODERM) 5 % Place 1 patch onto the skin daily. Remove & Discard patch within 12 hours or as directed by MD 30 patch 1   lithium carbonate (LITHOBID) 300 MG CR tablet Take 1 tablet (300 mg total) by mouth daily with breakfast. (Patient not taking: Reported on 07/20/2021) 30 tablet 0   lithium carbonate (LITHOBID) 300 MG CR tablet Take 2 tablets (600 mg total) by mouth at bedtime. (Patient not taking: Reported on 07/20/2021) 60 tablet 0   omeprazole (PRILOSEC) 20 MG capsule Take 20 mg by mouth 2 (two) times daily before a meal. (Patient not taking: Reported on 07/20/2021)     paliperidone (INVEGA SUSTENNA) 234 MG/1.5ML SUSY injection Inject 234 mg into the muscle every 30 (thirty) days. (Patient not taking: Reported on 07/20/2021)     paliperidone (INVEGA SUSTENNA) 234 MG/1.5ML SUSY injection Inject 234 mg into the muscle every 28 (twenty-eight) days. 1.8 mL 1   sertraline (ZOLOFT) 100 MG tablet Take 100 mg by mouth daily. (Patient not taking: Reported on 07/20/2021)     ziprasidone (GEODON) 20 MG injection Inject into the muscle. (Patient not taking: Reported on 07/20/2021)      Musculoskeletal: Strength & Muscle Tone: within normal limits Gait & Station: normal Patient leans: N/A   Psychiatric Specialty Exam:  Presentation  General Appearance: Appropriate for Environment  Eye Contact:Good  Speech:Clear and Coherent  Speech Volume:Normal  Handedness:Right   Mood and Affect  Mood:Euthymic  Affect:Appropriate   Thought Process  Thought Processes:Other (comment) (at baseline)  Descriptions of Associations:Loose  Orientation:Partial  Thought Content:-- (at  baseline)  History of Schizophrenia/Schizoaffective disorder:Yes  Duration of Psychotic Symptoms:Greater than six months  Hallucinations:Hallucinations: -- (denies)  Ideas of Reference:-- (denies)  Suicidal Thoughts:Suicidal Thoughts: No  Homicidal Thoughts:Homicidal Thoughts: No   Sensorium  Memory:Immediate Fair  Judgment:Poor  Insight:Lacking   Executive Functions  Concentration:Poor  Attention Span:Fair  Recall:Fair  Fund of Knowledge:Fair  Language:Fair   Psychomotor Activity  Psychomotor Activity:Psychomotor Activity: Normal   Assets  Assets:Housing; Health and safety inspector; Social Support; Resilience; Physical Health   Sleep  Sleep:Sleep: Good   Physical Exam: Physical Exam Vitals and nursing note reviewed.  HENT:     Head: Normocephalic.     Nose: No congestion or rhinorrhea.  Eyes:     General:        Right eye: No discharge.        Left eye: No discharge.  Cardiovascular:     Rate and Rhythm: Normal rate.  Pulmonary:     Effort: Pulmonary effort is normal.  Musculoskeletal:        General: Normal range of motion.     Cervical back: Normal range of motion.  Skin:    General: Skin is dry.  Neurological:     Mental Status: He is alert. Mental status is at baseline.  Psychiatric:        Attention and Perception: Attention normal.        Mood and Affect: Mood normal.        Speech: Speech normal.     Comments: Patient has schizophrenia and is at baseline   Review of Systems  Psychiatric/Behavioral:         Schizophrenia  All other systems reviewed and are negative. Blood pressure 101/63, pulse 80, temperature 98.8 F (37.1 C), temperature source Oral, resp. rate 16, height 5\' 9"  (1.753 m), weight 104.3 kg,  SpO2 97 %. Body mass index is 33.97 kg/m.  Treatment Plan Summary: Plan Patient does not require inpatient psych admission. Can return to group home when medically cleared. He is to have another xray to determine status  of nail in colon at 1500  Disposition: No evidence of imminent risk to self or others at present.   Patient does not meet criteria for psychiatric inpatient admission. Supportive therapy provided about ongoing stressors. Discussed crisis plan, support from social network, calling 911, coming to the Emergency Department, and calling Suicide Hotline.  [name], NP 07/21/2021 1:20 PM

## 2021-07-21 NOTE — ED Notes (Signed)
STAFF MUST GO WITH PATIENT TO EVERY BATHROOM VISIT!!!!! PATIENT HAS A NAIL IN STOMACH AREA FROM SWALLOWING FOREIGN OBJECTS

## 2021-07-21 NOTE — ED Provider Notes (Incomplete)
***   x-ray foreign body

## 2021-07-21 NOTE — ED Provider Notes (Signed)
Emergency Medicine Observation Re-evaluation Note  Lawrence Morgan is a 41 y.o. male, seen on rounds today.  Pt initially presented to the ED for complaints of Psychiatric Evaluation Currently, the patient is resting calmly in the hallway.  Physical Exam  BP 111/65    Pulse 78    Temp 98.8 F (37.1 C) (Oral)    Resp 16    Ht 5\' 9"  (1.753 m)    Wt 104.3 kg    SpO2 95%    BMI 33.97 kg/m  Physical Exam General: Alerts to voice, he is calm.  Reports no associated abdominal pain or nausea.  Is been drinking juice without difficulty Cardiac: Warm well perfused Lungs: Clear speech no difficulty breathing speaks in full sentences Psych: Calm, very flattened affect.  No agitation Abdomen is soft nontender nondistended through all quadrants.  Reports he had a bowel movement this morning  ED Course / MDM  EKG:   I have reviewed the labs performed to date as well as medications administered while in observation.  Recent changes in the last 24 hours include repeat x-ray.  X-ray this AM IMPRESSION:  1. Deck screw projecting over the right upper quadrant of the  abdomen, unchanged in position.   Had a bowel movement this morning, but it would not allow nursing to evaluated for a possible foreign body and was noted to flush it down the toilet.  Does not appear by his imaging that the deck screw has changed in position, however bowel movement was after his imaging study.  Another imaging study is planned for 3:00 today  Plan  Current plan is for placed a consult to psychiatry as the patient is here and had swallowed a screw, was not and is not currently under IVC, but I believe he may benefit from a psychiatry consult given the events that have occurred in the swallowing potentially dangerous object.  I spoke with Hoy Finlay of psychiatry team, she will see and provide a psychiatry consult for him this morning.  She is currently rounding in the ER   Delman Kitten, MD 07/21/21 [phone]

## 2022-04-07 IMAGING — CR DG ABDOMEN 1V
1 series · 2 of 2 positions shown · non-contrast
Comparison: Multiple plain films from earlier in the same day.

CLINICAL DATA: Swallowed metal foreign body, evaluate progress

EXAM:
ABDOMEN - 1 VIEW

[Series 1: dg abd 1 view · 0.14mm/px · 2 of 2 slices shown]
[im 1/2]
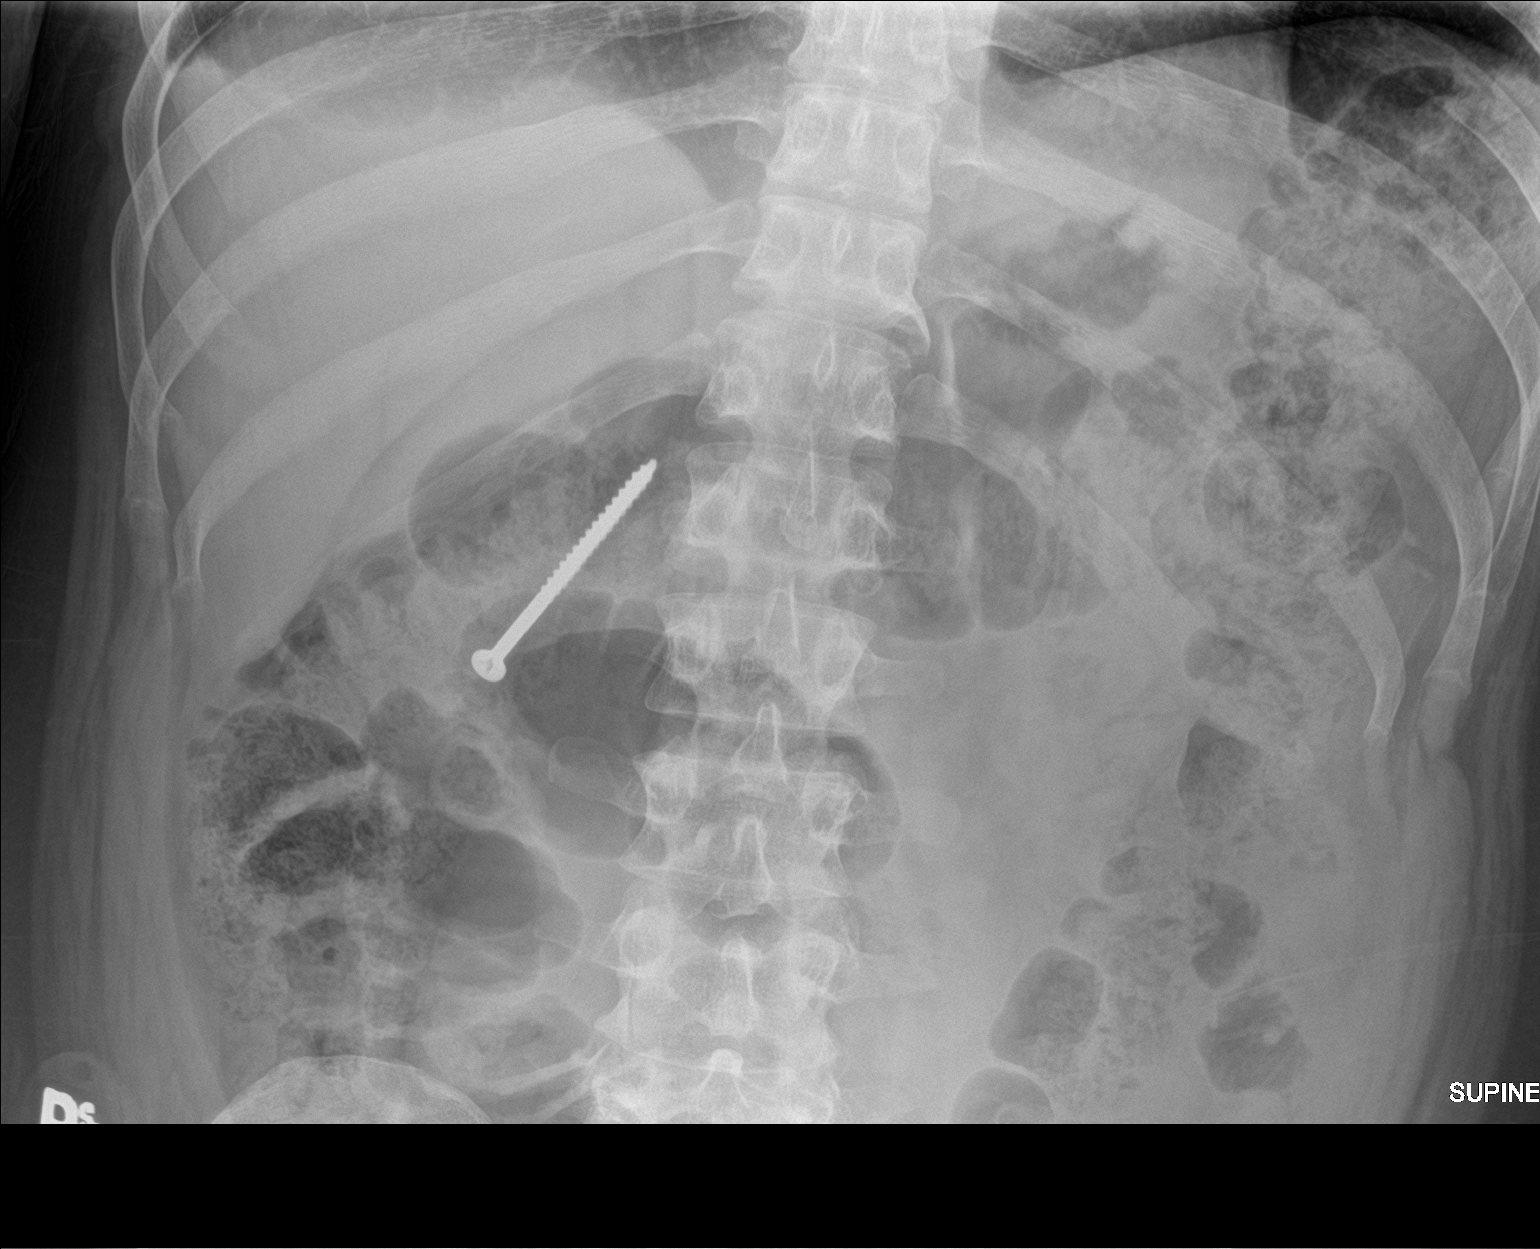
[im 2/2]
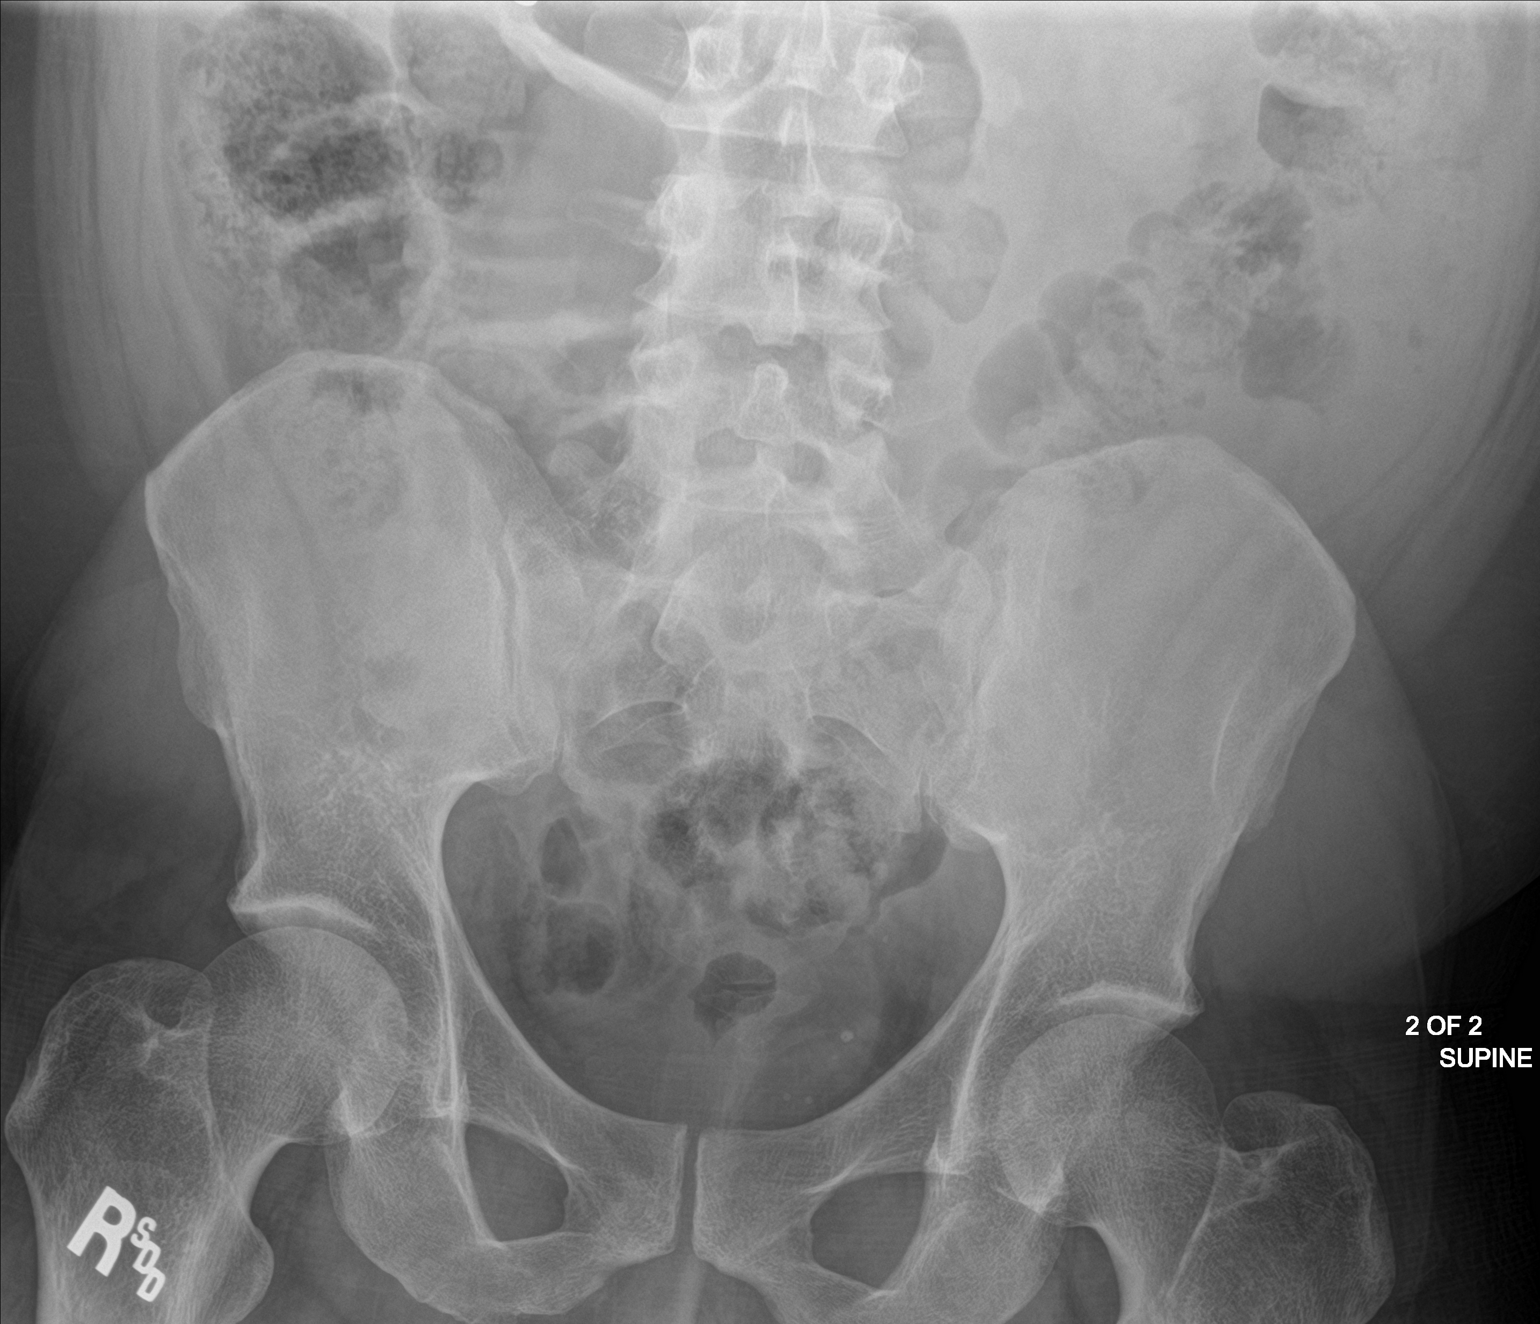

[2 of 2 positions shown; findings below may reference images not displayed]

FINDINGS: The long screw now lies over the proximal transverse colon
relatively stable from the prior exam. No free air is noted.
Retained fecal material is seen.
IMPRESSION: Stable appearance of ingested screw.

## 2022-04-07 IMAGING — CR DG ABDOMEN 1V
1 series · 2 of 2 positions shown · non-contrast
Comparison: Multiple priors including most recent from same day at
[DATE] a.m.

CLINICAL DATA: Evaluate for intestinal foreign body.

EXAM:
ABDOMEN - 1 VIEW

[Series 1: dg abd 1 view · 0.14mm/px · 2 of 2 slices shown]
[im 1/2]
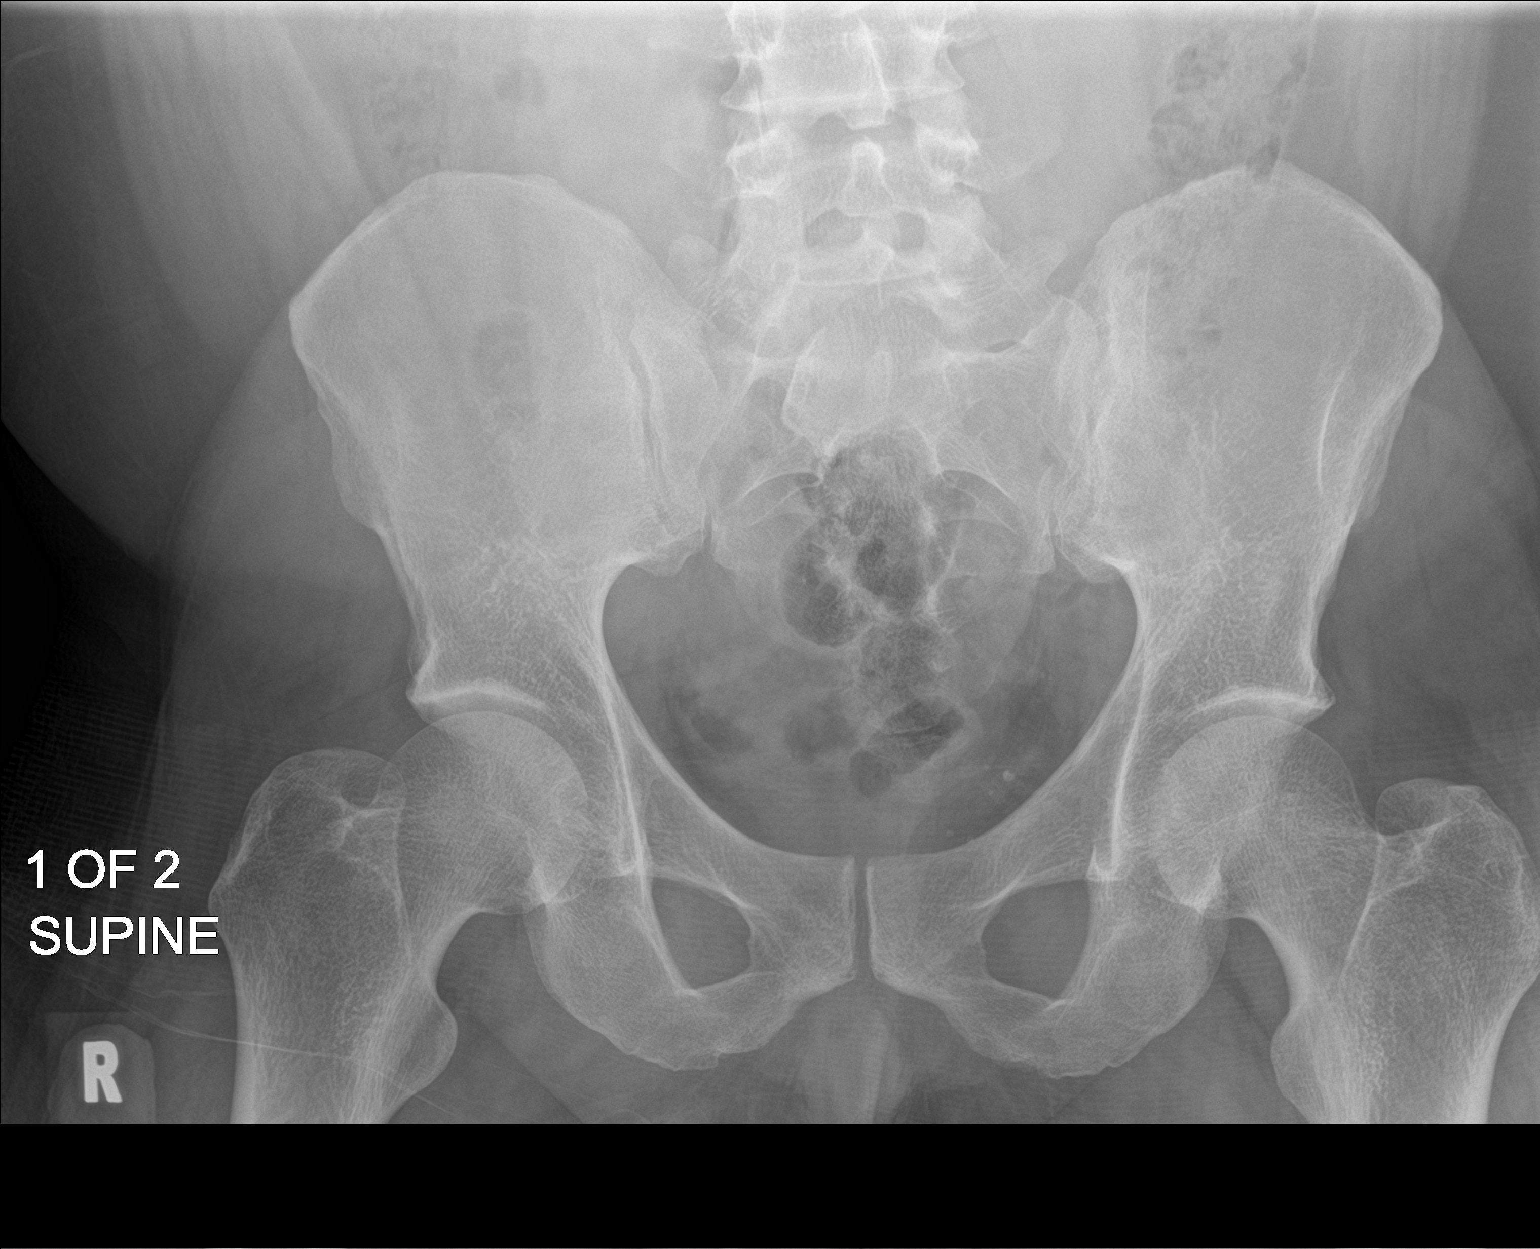
[im 2/2]
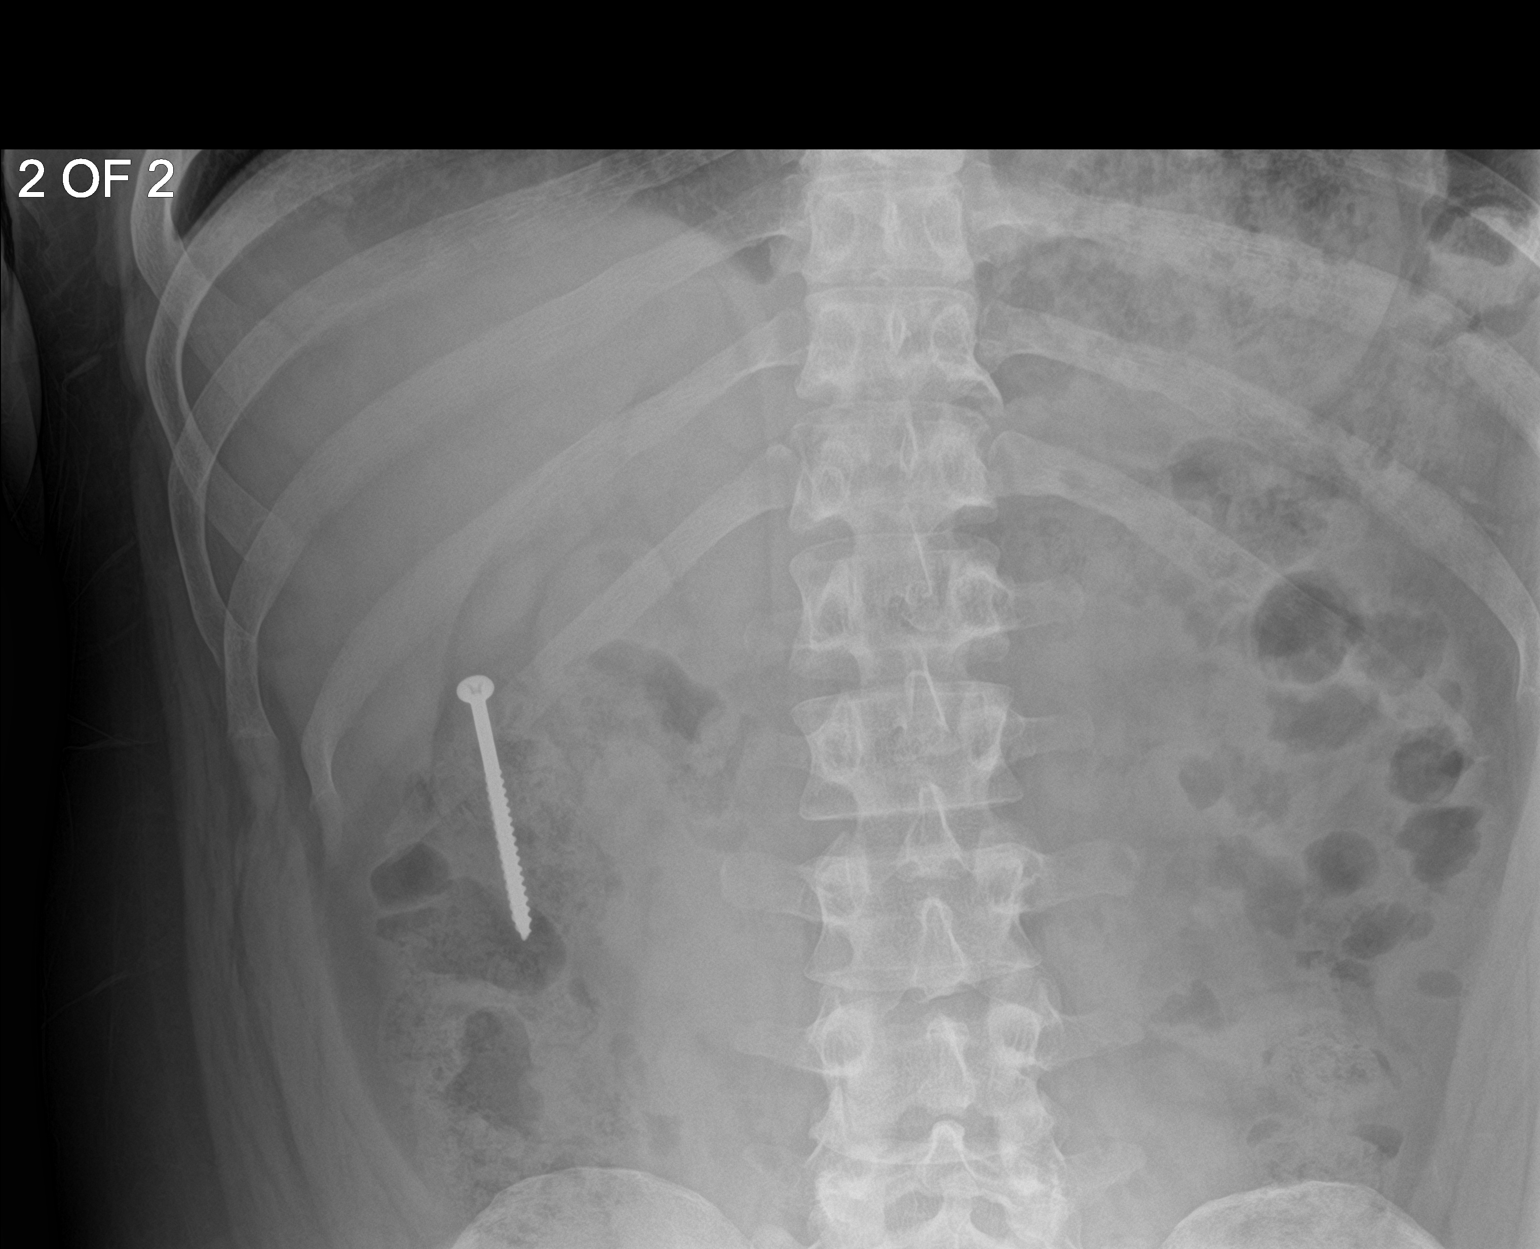

[2 of 2 positions shown; findings below may reference images not displayed]

FINDINGS: The bowel gas pattern is normal. Again noted is a deck screw
projected over the right upper quadrant of the abdomen, which is
changed in orientation but without significant change in positioning
in the bowel. No definite pneumoperitoneum on these supine
radiograph.
IMPRESSION: Deck screw projecting over the right upper quadrant of the abdomen
with change in orientation but no significant change in positioning.

## 2022-07-10 ENCOUNTER — Inpatient Hospital Stay
Admission: EM | Admit: 2022-07-10 | Discharge: 2022-07-15 | DRG: 327 | Disposition: A | Discharge status: Home / Self Care | Payer: Medicare Other | Attending: Internal Medicine | Admitting: Internal Medicine

## 2022-07-10 ENCOUNTER — Emergency Department: Payer: Medicare Other

## 2022-07-10 ENCOUNTER — Other Ambulatory Visit: Payer: Self-pay

## 2022-07-10 DIAGNOSIS — T182XXA Foreign body in stomach, initial encounter: Secondary | ICD-10-CM | POA: Diagnosis not present

## 2022-07-10 DIAGNOSIS — F250 Schizoaffective disorder, bipolar type: Secondary | ICD-10-CM | POA: Diagnosis not present

## 2022-07-10 DIAGNOSIS — T189XXA Foreign body of alimentary tract, part unspecified, initial encounter: Secondary | ICD-10-CM | POA: Diagnosis not present

## 2022-07-10 DIAGNOSIS — F609 Personality disorder, unspecified: Secondary | ICD-10-CM | POA: Diagnosis present

## 2022-07-10 DIAGNOSIS — F79 Unspecified intellectual disabilities: Secondary | ICD-10-CM | POA: Diagnosis present

## 2022-07-10 DIAGNOSIS — F419 Anxiety disorder, unspecified: Secondary | ICD-10-CM | POA: Diagnosis present

## 2022-07-10 DIAGNOSIS — F17210 Nicotine dependence, cigarettes, uncomplicated: Secondary | ICD-10-CM | POA: Diagnosis present

## 2022-07-10 DIAGNOSIS — R1033 Periumbilical pain: Secondary | ICD-10-CM | POA: Diagnosis not present

## 2022-07-10 DIAGNOSIS — K259 Gastric ulcer, unspecified as acute or chronic, without hemorrhage or perforation: Secondary | ICD-10-CM | POA: Diagnosis present

## 2022-07-10 DIAGNOSIS — Z91013 Allergy to seafood: Secondary | ICD-10-CM

## 2022-07-10 DIAGNOSIS — F32A Depression, unspecified: Secondary | ICD-10-CM | POA: Diagnosis present

## 2022-07-10 DIAGNOSIS — K219 Gastro-esophageal reflux disease without esophagitis: Secondary | ICD-10-CM | POA: Diagnosis present

## 2022-07-10 DIAGNOSIS — F4310 Post-traumatic stress disorder, unspecified: Secondary | ICD-10-CM | POA: Diagnosis present

## 2022-07-10 DIAGNOSIS — E785 Hyperlipidemia, unspecified: Secondary | ICD-10-CM | POA: Diagnosis present

## 2022-07-10 DIAGNOSIS — T1491XA Suicide attempt, initial encounter: Secondary | ICD-10-CM | POA: Diagnosis present

## 2022-07-10 DIAGNOSIS — R400 Somnolence: Secondary | ICD-10-CM | POA: Diagnosis present

## 2022-07-10 DIAGNOSIS — Z79899 Other long term (current) drug therapy: Secondary | ICD-10-CM

## 2022-07-10 DIAGNOSIS — Z1152 Encounter for screening for COVID-19: Secondary | ICD-10-CM

## 2022-07-10 DIAGNOSIS — R45851 Suicidal ideations: Secondary | ICD-10-CM | POA: Diagnosis present

## 2022-07-10 DIAGNOSIS — E039 Hypothyroidism, unspecified: Secondary | ICD-10-CM | POA: Diagnosis present

## 2022-07-10 DIAGNOSIS — W44A0XA Battery unspecified, entering into or through a natural orifice, initial encounter: Secondary | ICD-10-CM | POA: Diagnosis present

## 2022-07-10 DIAGNOSIS — Z888 Allergy status to other drugs, medicaments and biological substances status: Secondary | ICD-10-CM

## 2022-07-10 LAB — CBC
HCT: 41.3 % (ref 39.0–52.0)
Hemoglobin: 13 g/dL (ref 13.0–17.0)
MCH: 26.5 pg (ref 26.0–34.0)
MCHC: 31.5 g/dL (ref 30.0–36.0)
MCV: 84.3 fL (ref 80.0–100.0)
Platelets: 235 10*3/uL (ref 150–400)
RBC: 4.9 MIL/uL (ref 4.22–5.81)
RDW: 13.2 % (ref 11.5–15.5)
WBC: 11.9 10*3/uL — ABNORMAL HIGH (ref 4.0–10.5)
nRBC: 0 % (ref 0.0–0.2)

## 2022-07-10 NOTE — ED Triage Notes (Addendum)
Pt arrives with group home assistant, pt c/o abdominal pain. States he is tired of constant pain, says he swallowed 4 9-volt batteries a 3 days ago, thinks he has passed 2 of them. Pt in no acute distress presently, does endorse SI and AH/VH. Legal guardian: Audry Pili [phone])  Contact for group home staff below:  Assistant: Merlene Morse [phone]) Owner of [place]: Edd Arbour [phone])

## 2022-07-10 NOTE — ED Notes (Signed)
Black tobagan Red shirt Black pants White socks [employer]

## 2022-07-10 NOTE — ED Provider Notes (Signed)
[facility] Provider Note   Event Date/Time   First MD Initiated Contact with Patient 07/10/22 2335     (approximate) History  Abdominal Pain  HPI Lilton Nolasco is a 42 y.o. male with a past medical history of schizoaffective disorder and PTSD who presents from a group home with his assistant complaining of abdominal pain after ingesting four 9 V batteries.  Patient states that he ingested these 3 days ago but is presenting today due to constant pain in the periumbilical region. ROS: Patient currently denies any vision changes, tinnitus, difficulty speaking, facial droop, sore throat, chest pain, shortness of breath, nausea/vomiting/diarrhea, dysuria, or weakness/numbness/paresthesias in any extremity   Physical Exam  Triage Vital Signs: ED Triage Vitals  Enc Vitals Group     BP 07/10/22 2329 118/62     Pulse Rate 07/10/22 2329 83     Resp 07/10/22 2329 18     Temp 07/10/22 2327 98.1 F (36.7 C)     Temp Source 07/10/22 2327 Oral     SpO2 07/10/22 2329 98 %     Weight 07/10/22 2328 229 lb 4.5 oz (104 kg)     Height --      Head Circumference --      Peak Flow --      Pain Score --      Pain Loc --      Pain Edu? --      Excl. in [place]? --    Most recent vital signs: Vitals:   07/10/22 2329 07/11/22 0333  BP: 118/62 116/71  Pulse: 83 86  Resp: 18 18  Temp:  98.7 F (37.1 C)  SpO2: 98% 97%   General: Awake, oriented x4. CV:  Good peripheral perfusion.  Resp:  Normal effort.  Abd:  No distention.  Other:  Middle aged African-American male laying in bed in no acute distress ED Results / Procedures / Treatments  Labs (all labs ordered are listed, but only abnormal results are displayed) Labs Reviewed  SALICYLATE LEVEL - Abnormal; Notable for the following components:      Result Value   Salicylate Lvl [id number] (*)    All other components within normal limits  ACETAMINOPHEN LEVEL - Abnormal; Notable for the following components:   Acetaminophen  (Tylenol), Serum <10 (*)    All other components within normal limits  CBC - Abnormal; Notable for the following components:   WBC 11.9 (*)    All other components within normal limits  RESP PANEL BY RT-PCR (RSV, FLU A&B, COVID)  [id number]  COMPREHENSIVE METABOLIC PANEL  ETHANOL  URINE DRUG SCREEN, QUALITATIVE ([facility] ONLY)  HIV ANTIBODY (ROUTINE TESTING W REFLEX)  BASIC METABOLIC PANEL  CBC   RADIOLOGY ED MD interpretation: CT of the abdomen and pelvis without IV contrast interpreted independently by me and shows three 5 x 2 and half centimeter reticular batteries within the gastric lumen as well as three 3 cm screws within the cecal lumen without any evidence of bowel perforation/free air -Agree with radiology assessment Official radiology report(s): CT ABDOMEN PELVIS WO CONTRAST  Result Date: 07/11/2022 CLINICAL DATA:  Abdominal pain, acute, nonlocalized 3 batteries and 3 screws EXAM: CT ABDOMEN AND PELVIS WITHOUT CONTRAST TECHNIQUE: Multidetector CT imaging of the abdomen and pelvis was performed following the standard protocol without IV contrast. RADIATION DOSE REDUCTION: This exam was performed according to the departmental dose-optimization program which includes automated exposure control, adjustment of the mA and/or kV according to patient size and/or use  of iterative reconstruction technique. COMPARISON:  X-ray abdomen 07/11/2022 FINDINGS: Lower chest: Bilateral trace pleural effusions. Trace hiatal hernia. Hepatobiliary: No focal liver abnormality. No gallstones, gallbladder wall thickening, or pericholecystic fluid. No biliary dilatation. Pancreas: No focal lesion. Normal pancreatic contour. No surrounding inflammatory changes. No main pancreatic ductal dilatation. Spleen: Normal in size without focal abnormality. Adrenals/Urinary Tract: No adrenal nodule bilaterally. No nephrolithiasis and no hydronephrosis. No definite contour-deforming renal mass. No ureterolithiasis or hydroureter. The  urinary bladder is unremarkable. Stomach/Bowel: Three 5 x 2.5 cm rectangular batteries within the gastric lumen. Three 3 cm screws within the cecal lumen. Stomach is within normal limits. No evidence of bowel wall thickening or dilatation. Appendix appears normal. Vascular/Lymphatic: No abdominal aorta or iliac aneurysm. No abdominal, pelvic, or inguinal lymphadenopathy. Reproductive: Prostate is unremarkable. Other: No intraperitoneal free fluid. No intraperitoneal free gas. No organized fluid collection. Musculoskeletal: No abdominal wall hernia or abnormality. No suspicious lytic or blastic osseous lesions. No acute displaced fracture. Multilevel degenerative changes of the spine. IMPRESSION: 1. Three 5 x 2.5 cm rectangular batteries within the gastric lumen. 2. Three 3 cm screws within the cecal lumen. 3. No bowel perforation. 4. Bilateral trace pleural effusions. Electronically Signed   By: Iven Finn M.D.   On: 07/11/2022 01:12   DG Abdomen 1 View  Result Date: 07/11/2022 CLINICAL DATA:  ingested 4 9v batteries 2 days ago and only passed 2 EXAM: ABDOMEN - 1 VIEW COMPARISON:  X-ray abdomen 07/21/2021 FINDINGS: Total of 3 screws overlying the right lower abdomen are noted (measuring approximately 4 cm). Total of 3 rectangular batteries (measuring approximately 6.5 x 3.5 cm) overlying the mid abdomen. Previously identified longer screw measuring 7.5 cm on radiograph 02/29/2023 no longer visualized. The bowel gas pattern is normal. No findings to suggest pneumoperitoneum; however, limited evaluation on this supine radiograph. IMPRESSION: 1. Total of 3 screws overlying the right lower abdomen are noted (measuring approximately 4 cm). 2. Total of 3 rectangular batteries (measuring approximately 6.5 x 3.5 cm) overlying the mid abdomen. 3. Nonobstructive bowel gas pattern. 4. No findings to suggest pneumoperitoneum/bowel perforation with markedly limited evaluation on this supine radiograph. Recommend CT  abdomen/pelvis with intravenous contrast for further evaluation. These results were called by telephone at the time of interpretation on 07/11/2022 at 12:09 am to provider [facility] , who verbally acknowledged these results. Electronically Signed   By: Iven Finn M.D.   On: 07/11/2022 00:10   PROCEDURES: Critical Care performed: No Procedures MEDICATIONS ORDERED IN ED: Medications  lithium carbonate (ESKALITH) ER tablet 450 mg (has no administration in time range)  OLANZapine zydis (ZYPREXA) disintegrating tablet 20 mg (has no administration in time range)  lithium carbonate capsule 300 mg (has no administration in time range)  sertraline (ZOLOFT) tablet 100 mg (has no administration in time range)  traZODone (DESYREL) tablet 100 mg (has no administration in time range)  ziprasidone (GEODON) injection 10 mg (has no administration in time range)  docusate sodium (COLACE) capsule 200 mg (has no administration in time range)  pantoprazole (PROTONIX) EC tablet 40 mg (has no administration in time range)  hydrOXYzine (ATARAX) tablet 50 mg (has no administration in time range)  polyethylene glycol (MIRALAX / GLYCOLAX) packet 17 g (has no administration in time range)  oxybutynin (DITROPAN-XL) 24 hr tablet 10 mg (has no administration in time range)  ferrous sulfate tablet 325 mg (has no administration in time range)  benztropine (COGENTIN) tablet 0.5 mg (has no administration in time range)  cholecalciferol (  VITAMIN D3) 25 MCG (1000 UNIT) tablet 2,000 Units (has no administration in time range)  enoxaparin (LOVENOX) injection 52.5 mg (has no administration in time range)  0.9 %  sodium chloride infusion ( Intravenous New Bag/Given 07/11/22 0335)  acetaminophen (TYLENOL) tablet 650 mg (has no administration in time range)    Or  acetaminophen (TYLENOL) suppository 650 mg (has no administration in time range)  traZODone (DESYREL) tablet 25 mg (has no administration in time range)  ondansetron  (ZOFRAN) tablet 4 mg (has no administration in time range)    Or  ondansetron (ZOFRAN) injection 4 mg (has no administration in time range)  albuterol (PROVENTIL) (2.5 MG/3ML) 0.083% nebulizer solution 2.5 mg (has no administration in time range)   IMPRESSION / MDM / ASSESSMENT AND PLAN / ED COURSE  I reviewed the triage vital signs and the nursing notes.                             The patient is on the cardiac monitor to evaluate for evidence of arrhythmia and/or significant heart rate changes. Patient's presentation is most consistent with acute presentation with potential threat to life or bodily function. Thoughts are linear and organized, and patient has no AH, VH, or HI. Prior suicide attempt by ingestion Prior Psychiatric Hospitalizations: Multiple  Clinically patient displays no overt toxidrome; they are well appearing, with low suspicion for toxic ingestion given history and exam. Thoughts unlikely 2/2 anemia, hypothyroidism, infection, or ICH.  Consult: Spoke to Dr. Marius Ditch in gastroenterology who plans on an upper endoscopy to remove these batteries. Psychiatry to evaluate patient for potential hold for danger to self. Disposition: Plan admit to medicine for further management of symptoms.   FINAL CLINICAL IMPRESSION(S) / ED DIAGNOSES   Final diagnoses:  Periumbilical abdominal pain  Foreign body ingestion, initial encounter   Rx / DC Orders   ED Discharge Orders     None      Note:  This document was prepared using Dragon voice recognition software and may include unintentional dictation errors.   Naaman Plummer, MD 07/11/22 [phone]

## 2022-07-10 NOTE — ED Notes (Addendum)
Pt dressed out into wine scrubs, belongings placed into 2 bags contain: [place] by security

## 2022-07-10 NOTE — ED Notes (Signed)
Patient transported to X-ray 

## 2022-07-10 NOTE — ED Notes (Signed)
Pt told this RN he is hallucinating as well and his meds are messing with him.

## 2022-07-11 ENCOUNTER — Observation Stay: Payer: Medicare Other | Admitting: Anesthesiology

## 2022-07-11 ENCOUNTER — Other Ambulatory Visit: Payer: Self-pay

## 2022-07-11 ENCOUNTER — Emergency Department: Payer: Medicare Other

## 2022-07-11 ENCOUNTER — Encounter: Payer: Self-pay | Admitting: Family Medicine

## 2022-07-11 ENCOUNTER — Encounter
Admission: EM | Disposition: A | Discharge status: Home / Self Care | Payer: Self-pay | Source: Home / Self Care | Attending: Internal Medicine

## 2022-07-11 DIAGNOSIS — F609 Personality disorder, unspecified: Secondary | ICD-10-CM | POA: Diagnosis present

## 2022-07-11 DIAGNOSIS — T189XXA Foreign body of alimentary tract, part unspecified, initial encounter: Secondary | ICD-10-CM | POA: Diagnosis present

## 2022-07-11 DIAGNOSIS — F4310 Post-traumatic stress disorder, unspecified: Secondary | ICD-10-CM | POA: Diagnosis present

## 2022-07-11 DIAGNOSIS — Z888 Allergy status to other drugs, medicaments and biological substances status: Secondary | ICD-10-CM | POA: Diagnosis not present

## 2022-07-11 DIAGNOSIS — Z91013 Allergy to seafood: Secondary | ICD-10-CM | POA: Diagnosis not present

## 2022-07-11 DIAGNOSIS — E785 Hyperlipidemia, unspecified: Secondary | ICD-10-CM | POA: Diagnosis not present

## 2022-07-11 DIAGNOSIS — K219 Gastro-esophageal reflux disease without esophagitis: Secondary | ICD-10-CM | POA: Diagnosis present

## 2022-07-11 DIAGNOSIS — F32A Depression, unspecified: Secondary | ICD-10-CM | POA: Diagnosis not present

## 2022-07-11 DIAGNOSIS — R1033 Periumbilical pain: Secondary | ICD-10-CM | POA: Diagnosis not present

## 2022-07-11 DIAGNOSIS — T1491XA Suicide attempt, initial encounter: Secondary | ICD-10-CM | POA: Diagnosis not present

## 2022-07-11 DIAGNOSIS — E039 Hypothyroidism, unspecified: Secondary | ICD-10-CM | POA: Diagnosis present

## 2022-07-11 DIAGNOSIS — F79 Unspecified intellectual disabilities: Secondary | ICD-10-CM | POA: Diagnosis present

## 2022-07-11 DIAGNOSIS — F250 Schizoaffective disorder, bipolar type: Secondary | ICD-10-CM | POA: Diagnosis not present

## 2022-07-11 DIAGNOSIS — F419 Anxiety disorder, unspecified: Secondary | ICD-10-CM | POA: Diagnosis present

## 2022-07-11 DIAGNOSIS — Z79899 Other long term (current) drug therapy: Secondary | ICD-10-CM | POA: Diagnosis not present

## 2022-07-11 DIAGNOSIS — Z1152 Encounter for screening for COVID-19: Secondary | ICD-10-CM | POA: Diagnosis not present

## 2022-07-11 DIAGNOSIS — R400 Somnolence: Secondary | ICD-10-CM | POA: Diagnosis present

## 2022-07-11 DIAGNOSIS — F17210 Nicotine dependence, cigarettes, uncomplicated: Secondary | ICD-10-CM | POA: Diagnosis present

## 2022-07-11 DIAGNOSIS — W44A0XA Battery unspecified, entering into or through a natural orifice, initial encounter: Secondary | ICD-10-CM | POA: Diagnosis present

## 2022-07-11 DIAGNOSIS — K259 Gastric ulcer, unspecified as acute or chronic, without hemorrhage or perforation: Secondary | ICD-10-CM | POA: Diagnosis present

## 2022-07-11 DIAGNOSIS — R45851 Suicidal ideations: Secondary | ICD-10-CM | POA: Diagnosis present

## 2022-07-11 DIAGNOSIS — T182XXA Foreign body in stomach, initial encounter: Secondary | ICD-10-CM | POA: Diagnosis present

## 2022-07-11 HISTORY — PX: LAPAROTOMY: SHX154

## 2022-07-11 HISTORY — PX: ESOPHAGOGASTRODUODENOSCOPY: SHX5428

## 2022-07-11 LAB — URINE DRUG SCREEN, QUALITATIVE (ARMC ONLY)
Amphetamines, Ur Screen: NOT DETECTED
Barbiturates, Ur Screen: NOT DETECTED
Benzodiazepine, Ur Scrn: NOT DETECTED
Cannabinoid 50 Ng, Ur ~~LOC~~: NOT DETECTED
Cocaine Metabolite,Ur ~~LOC~~: NOT DETECTED
MDMA (Ecstasy)Ur Screen: NOT DETECTED
Methadone Scn, Ur: NOT DETECTED
Opiate, Ur Screen: NOT DETECTED
Phencyclidine (PCP) Ur S: NOT DETECTED
Tricyclic, Ur Screen: NOT DETECTED

## 2022-07-11 LAB — CBC
HCT: 37.9 % — ABNORMAL LOW (ref 39.0–52.0)
Hemoglobin: 11.7 g/dL — ABNORMAL LOW (ref 13.0–17.0)
MCH: 26.8 pg (ref 26.0–34.0)
MCHC: 30.9 g/dL (ref 30.0–36.0)
MCV: 86.7 fL (ref 80.0–100.0)
Platelets: 210 10*3/uL (ref 150–400)
RBC: 4.37 MIL/uL (ref 4.22–5.81)
RDW: 13.2 % (ref 11.5–15.5)
WBC: 8.9 10*3/uL (ref 4.0–10.5)
nRBC: 0 % (ref 0.0–0.2)

## 2022-07-11 LAB — RESP PANEL BY RT-PCR (RSV, FLU A&B, COVID)  RVPGX2
Influenza A by PCR: NEGATIVE
Influenza B by PCR: NEGATIVE
Resp Syncytial Virus by PCR: NEGATIVE
SARS Coronavirus 2 by RT PCR: NEGATIVE

## 2022-07-11 LAB — HIV ANTIBODY (ROUTINE TESTING W REFLEX): HIV Screen 4th Generation wRfx: NONREACTIVE

## 2022-07-11 LAB — COMPREHENSIVE METABOLIC PANEL
ALT: 24 U/L (ref 0–44)
AST: 24 U/L (ref 15–41)
Albumin: 4.6 g/dL (ref 3.5–5.0)
Alkaline Phosphatase: 78 U/L (ref 38–126)
Anion gap: 7 (ref 5–15)
BUN: 11 mg/dL (ref 6–20)
CO2: 24 mmol/L (ref 22–32)
Calcium: 9.5 mg/dL (ref 8.9–10.3)
Chloride: 107 mmol/L (ref 98–111)
Creatinine, Ser: 1.01 mg/dL (ref 0.61–1.24)
GFR, Estimated: >60 mL/min (ref >60)
Glucose, Bld: 93 mg/dL (ref 70–99)
Potassium: 3.7 mmol/L (ref 3.5–5.1)
Sodium: 138 mmol/L (ref 135–145)
Total Bilirubin: 0.6 mg/dL (ref 0.3–1.2)
Total Protein: 7.6 g/dL (ref 6.5–8.1)

## 2022-07-11 LAB — BASIC METABOLIC PANEL
Anion gap: 4 — ABNORMAL LOW (ref 5–15)
BUN: 10 mg/dL (ref 6–20)
CO2: 25 mmol/L (ref 22–32)
Calcium: 8.9 mg/dL (ref 8.9–10.3)
Chloride: 108 mmol/L (ref 98–111)
Creatinine, Ser: 0.9 mg/dL (ref 0.61–1.24)
GFR, Estimated: >60 mL/min (ref >60)
Glucose, Bld: 113 mg/dL — ABNORMAL HIGH (ref 70–99)
Potassium: 3.8 mmol/L (ref 3.5–5.1)
Sodium: 137 mmol/L (ref 135–145)

## 2022-07-11 LAB — GLUCOSE, CAPILLARY: Glucose-Capillary: 153 mg/dL — ABNORMAL HIGH (ref 70–99)

## 2022-07-11 LAB — SALICYLATE LEVEL: Salicylate Lvl: <7 mg/dL — ABNORMAL LOW (ref 7.0–30.0)

## 2022-07-11 LAB — MRSA NEXT GEN BY PCR, NASAL: MRSA by PCR Next Gen: NOT DETECTED

## 2022-07-11 LAB — ETHANOL: Alcohol, Ethyl (B): <10 mg/dL (ref <10)

## 2022-07-11 LAB — ACETAMINOPHEN LEVEL: Acetaminophen (Tylenol), Serum: <10 ug/mL — ABNORMAL LOW (ref 10–30)

## 2022-07-11 SURGERY — EGD (ESOPHAGOGASTRODUODENOSCOPY)
Anesthesia: General

## 2022-07-11 SURGERY — LAPAROTOMY, EXPLORATORY
Anesthesia: Choice | Site: Abdomen

## 2022-07-11 MED ORDER — LITHIUM CARBONATE 300 MG PO CAPS
300.0000 mg | ORAL_CAPSULE | Freq: Two times a day (BID) | ORAL | Status: DC
  Administered 2022-07-12 – 2022-07-15 (×4): 300 mg via ORAL
  Filled 2022-07-11 (×10): qty 1

## 2022-07-11 MED ORDER — ACETAMINOPHEN 650 MG RE SUPP: 650.0000 mg | Freq: Four times a day (QID) | RECTAL | Status: DC | PRN

## 2022-07-11 MED ORDER — SERTRALINE HCL 50 MG PO TABS
100.0000 mg | ORAL_TABLET | Freq: Every day | ORAL | Status: DC
  Administered 2022-07-12 – 2022-07-15 (×3): 100 mg via ORAL
  Filled 2022-07-11 (×4): qty 2

## 2022-07-11 MED ORDER — BENZTROPINE MESYLATE 0.5 MG PO TABS
0.5000 mg | ORAL_TABLET | Freq: Two times a day (BID) | ORAL | Status: DC
  Administered 2022-07-11 – 2022-07-15 (×6): 0.5 mg via ORAL
  Filled 2022-07-11 (×8): qty 1

## 2022-07-11 MED ORDER — ONDANSETRON HCL 4 MG/2ML IJ SOLN
INTRAMUSCULAR | Status: AC
  Filled 2022-07-11: qty 2

## 2022-07-11 MED ORDER — ACETAMINOPHEN 10 MG/ML IV SOLN
INTRAVENOUS | Status: DC | PRN
  Administered 2022-07-11: 1000 mg via INTRAVENOUS

## 2022-07-11 MED ORDER — KETOROLAC TROMETHAMINE 30 MG/ML IJ SOLN
INTRAMUSCULAR | Status: AC
  Filled 2022-07-11: qty 1

## 2022-07-11 MED ORDER — PHENYLEPHRINE HCL (PRESSORS) 10 MG/ML IV SOLN
INTRAVENOUS | Status: DC | PRN
  Administered 2022-07-11: 80 ug via INTRAVENOUS
  Administered 2022-07-11: 160 ug via INTRAVENOUS

## 2022-07-11 MED ORDER — MORPHINE SULFATE (PF) 2 MG/ML IV SOLN: 2.0000 mg | INTRAVENOUS | Status: DC | PRN

## 2022-07-11 MED ORDER — LIDOCAINE HCL (PF) 2 % IJ SOLN
INTRAMUSCULAR | Status: AC
  Filled 2022-07-11: qty 5

## 2022-07-11 MED ORDER — ENOXAPARIN SODIUM 60 MG/0.6ML IJ SOSY
0.5000 mg/kg | PREFILLED_SYRINGE | INTRAMUSCULAR | Status: DC
  Administered 2022-07-12 – 2022-07-15 (×3): 52.5 mg via SUBCUTANEOUS
  Filled 2022-07-11 (×4): qty 0.6

## 2022-07-11 MED ORDER — LIDOCAINE HCL (CARDIAC) PF 100 MG/5ML IV SOSY
PREFILLED_SYRINGE | INTRAVENOUS | Status: DC | PRN
  Administered 2022-07-11: 100 mg via INTRAVENOUS

## 2022-07-11 MED ORDER — CEFAZOLIN SODIUM-DEXTROSE 2-3 GM-%(50ML) IV SOLR
INTRAVENOUS | Status: DC | PRN
  Administered 2022-07-11: 2 g via INTRAVENOUS

## 2022-07-11 MED ORDER — ACETAMINOPHEN 325 MG PO TABS
650.0000 mg | ORAL_TABLET | Freq: Four times a day (QID) | ORAL | Status: DC | PRN
  Administered 2022-07-15: 650 mg via ORAL
  Filled 2022-07-11: qty 2

## 2022-07-11 MED ORDER — SODIUM CHLORIDE 0.9 % IV SOLN
INTRAVENOUS | Status: DC | PRN
  Administered 2022-07-11: 20 mL

## 2022-07-11 MED ORDER — DEXAMETHASONE SODIUM PHOSPHATE 10 MG/ML IJ SOLN
INTRAMUSCULAR | Status: AC
  Filled 2022-07-11: qty 1

## 2022-07-11 MED ORDER — HYDROXYZINE HCL 25 MG PO TABS
50.0000 mg | ORAL_TABLET | Freq: Four times a day (QID) | ORAL | Status: DC | PRN
  Administered 2022-07-14: 50 mg via ORAL
  Filled 2022-07-11: qty 2

## 2022-07-11 MED ORDER — ONDANSETRON HCL 4 MG/2ML IJ SOLN
INTRAMUSCULAR | Status: DC | PRN
  Administered 2022-07-11: 4 mg via INTRAVENOUS

## 2022-07-11 MED ORDER — LITHIUM CARBONATE ER 450 MG PO TBCR
450.0000 mg | EXTENDED_RELEASE_TABLET | Freq: Two times a day (BID) | ORAL | Status: DC
  Filled 2022-07-11: qty 1

## 2022-07-11 MED ORDER — PROPOFOL 10 MG/ML IV BOLUS
INTRAVENOUS | Status: DC | PRN
  Administered 2022-07-11: 200 mg via INTRAVENOUS
  Administered 2022-07-11: 100 mg via INTRAVENOUS

## 2022-07-11 MED ORDER — OXYBUTYNIN CHLORIDE ER 10 MG PO TB24
10.0000 mg | ORAL_TABLET | Freq: Every day | ORAL | Status: DC
  Administered 2022-07-11 – 2022-07-14 (×4): 10 mg via ORAL
  Filled 2022-07-11 (×5): qty 1

## 2022-07-11 MED ORDER — MORPHINE SULFATE (PF) 4 MG/ML IV SOLN
4.0000 mg | INTRAVENOUS | Status: DC | PRN
  Administered 2022-07-12 (×2): 2 mg via INTRAVENOUS
  Administered 2022-07-13 – 2022-07-14 (×4): 4 mg via INTRAVENOUS
  Filled 2022-07-11 (×7): qty 1

## 2022-07-11 MED ORDER — ACETAMINOPHEN 10 MG/ML IV SOLN
INTRAVENOUS | Status: AC
  Filled 2022-07-11: qty 100

## 2022-07-11 MED ORDER — BUPIVACAINE HCL (PF) 0.5 % IJ SOLN
INTRAMUSCULAR | Status: AC
  Filled 2022-07-11: qty 30

## 2022-07-11 MED ORDER — MIDAZOLAM HCL 2 MG/2ML IJ SOLN
INTRAMUSCULAR | Status: AC
  Filled 2022-07-11: qty 2

## 2022-07-11 MED ORDER — FENTANYL CITRATE (PF) 100 MCG/2ML IJ SOLN
INTRAMUSCULAR | Status: AC
  Filled 2022-07-11: qty 2

## 2022-07-11 MED ORDER — LITHIUM CARBONATE ER 450 MG PO TBCR
900.0000 mg | EXTENDED_RELEASE_TABLET | Freq: Every day | ORAL | Status: DC
  Administered 2022-07-11 – 2022-07-14 (×4): 900 mg via ORAL
  Filled 2022-07-11 (×5): qty 2

## 2022-07-11 MED ORDER — BUPIVACAINE-EPINEPHRINE (PF) 0.5% -1:200000 IJ SOLN
INTRAMUSCULAR | Status: DC | PRN
  Administered 2022-07-11: 30 mL

## 2022-07-11 MED ORDER — SODIUM CHLORIDE 0.9 % IV SOLN: INTRAVENOUS | Status: DC

## 2022-07-11 MED ORDER — VITAMIN D 25 MCG (1000 UNIT) PO TABS
2000.0000 [IU] | ORAL_TABLET | Freq: Every day | ORAL | Status: DC
  Administered 2022-07-13 – 2022-07-15 (×3): 2000 [IU] via ORAL
  Filled 2022-07-11 (×3): qty 2

## 2022-07-11 MED ORDER — ZIPRASIDONE MESYLATE 20 MG IM SOLR: 10.0000 mg | Freq: Four times a day (QID) | INTRAMUSCULAR | Status: DC | PRN

## 2022-07-11 MED ORDER — TRAZODONE HCL 50 MG PO TABS: 25.0000 mg | ORAL_TABLET | Freq: Every evening | ORAL | Status: DC | PRN

## 2022-07-11 MED ORDER — LACTATED RINGERS IV SOLN: INTRAVENOUS | Status: DC | PRN

## 2022-07-11 MED ORDER — SUCCINYLCHOLINE CHLORIDE 200 MG/10ML IV SOSY
PREFILLED_SYRINGE | INTRAVENOUS | Status: DC | PRN
  Administered 2022-07-11: 120 mg via INTRAVENOUS

## 2022-07-11 MED ORDER — ONDANSETRON HCL 4 MG PO TABS: 4.0000 mg | ORAL_TABLET | Freq: Four times a day (QID) | ORAL | Status: DC | PRN

## 2022-07-11 MED ORDER — POLYETHYLENE GLYCOL 3350 17 G PO PACK
17.0000 g | PACK | Freq: Every day | ORAL | Status: DC
  Administered 2022-07-15: 17 g via ORAL
  Filled 2022-07-11 (×3): qty 1

## 2022-07-11 MED ORDER — OLANZAPINE 10 MG PO TBDP
20.0000 mg | ORAL_TABLET | Freq: Every day | ORAL | Status: DC
  Administered 2022-07-11 – 2022-07-14 (×4): 20 mg via ORAL
  Filled 2022-07-11 (×5): qty 2

## 2022-07-11 MED ORDER — PANTOPRAZOLE SODIUM 40 MG PO TBEC
40.0000 mg | DELAYED_RELEASE_TABLET | Freq: Every day | ORAL | Status: DC
  Administered 2022-07-13 – 2022-07-15 (×3): 40 mg via ORAL
  Filled 2022-07-11 (×3): qty 1

## 2022-07-11 MED ORDER — BUPIVACAINE LIPOSOME 1.3 % IJ SUSP
INTRAMUSCULAR | Status: AC
  Filled 2022-07-11: qty 20

## 2022-07-11 MED ORDER — ALBUTEROL SULFATE (2.5 MG/3ML) 0.083% IN NEBU
2.5000 mg | INHALATION_SOLUTION | Freq: Four times a day (QID) | RESPIRATORY_TRACT | Status: DC | PRN
  Administered 2022-07-14: 2.5 mg via RESPIRATORY_TRACT
  Filled 2022-07-11: qty 3

## 2022-07-11 MED ORDER — DOCUSATE SODIUM 100 MG PO CAPS
200.0000 mg | ORAL_CAPSULE | Freq: Two times a day (BID) | ORAL | Status: DC
  Administered 2022-07-11 – 2022-07-15 (×6): 200 mg via ORAL
  Filled 2022-07-11 (×7): qty 2

## 2022-07-11 MED ORDER — ONDANSETRON HCL 4 MG/2ML IJ SOLN: 4.0000 mg | Freq: Four times a day (QID) | INTRAMUSCULAR | Status: DC | PRN

## 2022-07-11 MED ORDER — SUGAMMADEX SODIUM 200 MG/2ML IV SOLN
INTRAVENOUS | Status: DC | PRN
  Administered 2022-07-11: 200 mg via INTRAVENOUS

## 2022-07-11 MED ORDER — ALBUTEROL SULFATE HFA 108 (90 BASE) MCG/ACT IN AERS: 2.0000 | INHALATION_SPRAY | Freq: Four times a day (QID) | RESPIRATORY_TRACT | Status: DC | PRN

## 2022-07-11 MED ORDER — OXYCODONE HCL 5 MG PO TABS: 5.0000 mg | ORAL_TABLET | Freq: Once | ORAL | Status: DC | PRN

## 2022-07-11 MED ORDER — HYDROMORPHONE HCL 1 MG/ML IJ SOLN: 0.2500 mg | INTRAMUSCULAR | Status: DC | PRN

## 2022-07-11 MED ORDER — EPINEPHRINE PF 1 MG/ML IJ SOLN
INTRAMUSCULAR | Status: AC
  Filled 2022-07-11: qty 1

## 2022-07-11 MED ORDER — OXYCODONE HCL 5 MG/5ML PO SOLN: 5.0000 mg | Freq: Once | ORAL | Status: DC | PRN

## 2022-07-11 MED ORDER — FERROUS SULFATE 325 (65 FE) MG PO TABS
325.0000 mg | ORAL_TABLET | Freq: Every day | ORAL | Status: DC
  Administered 2022-07-13 – 2022-07-15 (×2): 325 mg via ORAL
  Filled 2022-07-11 (×2): qty 1

## 2022-07-11 MED ORDER — KETOROLAC TROMETHAMINE 30 MG/ML IJ SOLN
INTRAMUSCULAR | Status: DC | PRN
  Administered 2022-07-11: 30 mg via INTRAVENOUS

## 2022-07-11 MED ORDER — FENTANYL CITRATE (PF) 100 MCG/2ML IJ SOLN
INTRAMUSCULAR | Status: DC | PRN
  Administered 2022-07-11 (×2): 50 ug via INTRAVENOUS

## 2022-07-11 MED ORDER — LITHIUM CARBONATE 300 MG PO CAPS
300.0000 mg | ORAL_CAPSULE | Freq: Two times a day (BID) | ORAL | Status: DC
  Filled 2022-07-11: qty 1

## 2022-07-11 MED ORDER — 0.9 % SODIUM CHLORIDE (POUR BTL) OPTIME
TOPICAL | Status: DC | PRN
  Administered 2022-07-11: 200 mL

## 2022-07-11 MED ORDER — CHLORHEXIDINE GLUCONATE CLOTH 2 % EX PADS
6.0000 | MEDICATED_PAD | Freq: Every day | CUTANEOUS | Status: DC
  Administered 2022-07-12 – 2022-07-15 (×4): 6 via TOPICAL

## 2022-07-11 MED ORDER — DEXAMETHASONE SODIUM PHOSPHATE 10 MG/ML IJ SOLN
INTRAMUSCULAR | Status: DC | PRN
  Administered 2022-07-11: 10 mg via INTRAVENOUS

## 2022-07-11 MED ORDER — MIDAZOLAM HCL 2 MG/2ML IJ SOLN
INTRAMUSCULAR | Status: DC | PRN
  Administered 2022-07-11: 2 mg via INTRAVENOUS

## 2022-07-11 MED ORDER — PHENYLEPHRINE HCL (PRESSORS) 10 MG/ML IV SOLN
INTRAVENOUS | Status: DC | PRN
  Administered 2022-07-11: 80 ug via INTRAVENOUS
  Administered 2022-07-11: 160 ug via INTRAVENOUS
  Administered 2022-07-11: 80 ug via INTRAVENOUS

## 2022-07-11 MED ORDER — TRAZODONE HCL 100 MG PO TABS
100.0000 mg | ORAL_TABLET | Freq: Every day | ORAL | Status: DC
  Administered 2022-07-11 – 2022-07-14 (×4): 100 mg via ORAL
  Filled 2022-07-11 (×4): qty 1

## 2022-07-11 MED ORDER — ROCURONIUM BROMIDE 100 MG/10ML IV SOLN
INTRAVENOUS | Status: DC | PRN
  Administered 2022-07-11: 50 mg via INTRAVENOUS
  Administered 2022-07-11: 30 mg via INTRAVENOUS
  Administered 2022-07-11: 20 mg via INTRAVENOUS

## 2022-07-11 SURGICAL SUPPLY — 54 items
ADH SKN CLS APL DERMABOND .7 (GAUZE/BANDAGES/DRESSINGS) ×1
APL PRP STRL LF DISP 70% ISPRP (MISCELLANEOUS) ×1
CHLORAPREP W/TINT 26 (MISCELLANEOUS) ×1 IMPLANT
DERMABOND ADVANCED .7 DNX12 (GAUZE/BANDAGES/DRESSINGS) IMPLANT
DRAPE LAPAROTOMY 100X77 ABD (DRAPES) ×1 IMPLANT
DRSG OPSITE POSTOP 4X10 (GAUZE/BANDAGES/DRESSINGS) IMPLANT
DRSG OPSITE POSTOP 4X8 (GAUZE/BANDAGES/DRESSINGS) IMPLANT
ELECT BLADE 6.5 EXT (BLADE) ×1 IMPLANT
ELECT CAUTERY BLADE 6.4 (BLADE) ×1 IMPLANT
ELECT REM PT RETURN 9FT ADLT (ELECTROSURGICAL) ×1
ELECTRODE REM PT RTRN 9FT ADLT (ELECTROSURGICAL) ×1 IMPLANT
GAUZE 4X4 16PLY ~~LOC~~+RFID DBL (SPONGE) ×1 IMPLANT
GLOVE BIO SURGEON STRL SZ 6.5 (GLOVE) ×1 IMPLANT
GLOVE BIOGEL PI IND STRL 6.5 (GLOVE) ×1 IMPLANT
GOWN STRL REUS W/ TWL LRG LVL3 (GOWN DISPOSABLE) ×2 IMPLANT
GOWN STRL REUS W/TWL LRG LVL3 (GOWN DISPOSABLE) ×2
JET LAVAGE IRRISEPT WOUND (IRRIGATION / IRRIGATOR) ×1
KIT TURNOVER KIT A (KITS) ×1 IMPLANT
LABEL OR SOLS (LABEL) ×1 IMPLANT
LAVAGE JET IRRISEPT WOUND (IRRIGATION / IRRIGATOR) IMPLANT
LIGASURE IMPACT 36 18CM CVD LR (INSTRUMENTS) IMPLANT
MANIFOLD NEPTUNE II (INSTRUMENTS) ×1 IMPLANT
NDL HYPO 25X1 1.5 SAFETY (NEEDLE) IMPLANT
NEEDLE HYPO 22GX1.5 SAFETY (NEEDLE) ×1 IMPLANT
NEEDLE HYPO 25X1 1.5 SAFETY (NEEDLE) ×1 IMPLANT
NS IRRIG 1000ML POUR BTL (IV SOLUTION) ×1 IMPLANT
PACK BASIN MAJOR ARMC (MISCELLANEOUS) ×1 IMPLANT
PACK COLON CLEAN CLOSURE (MISCELLANEOUS) ×1 IMPLANT
RELOAD LINEAR CUT PROX 55 BLUE (ENDOMECHANICALS) IMPLANT
RELOAD PROXIMATE 75MM BLUE (ENDOMECHANICALS) IMPLANT
RELOAD STAPLE 55 3.8 BLU REG (ENDOMECHANICALS) IMPLANT
RELOAD STAPLE 75 3.8 BLU REG (ENDOMECHANICALS) IMPLANT
RETRACTOR WOUND ALXS 18CM MED (MISCELLANEOUS) IMPLANT
RTRCTR WOUND ALEXIS O 18CM MED (MISCELLANEOUS) ×1
SPONGE T-LAP 18X18 ~~LOC~~+RFID (SPONGE) ×4 IMPLANT
STAPLER GUN LINEAR PROX 60 (STAPLE) IMPLANT
STAPLER PROXIMATE 55 BLUE (STAPLE) IMPLANT
STAPLER PROXIMATE 75MM BLUE (STAPLE) IMPLANT
STAPLER SKIN PROX 35W (STAPLE) ×1 IMPLANT
SUT MNCRL 4-0 (SUTURE) ×1
SUT MNCRL 4-0 27XMFL (SUTURE) ×1
SUT SILK 2 0 (SUTURE) ×1
SUT SILK 2-0 18XBRD TIE 12 (SUTURE) ×1 IMPLANT
SUT SILK 3-0 (SUTURE) IMPLANT
SUT STRATAFIX PDS 30 CT-1 (SUTURE) IMPLANT
SUT VIC AB 3-0 SH 27 (SUTURE) ×1
SUT VIC AB 3-0 SH 27X BRD (SUTURE) ×1 IMPLANT
SUT VLOC 90 6 CV-15 VIOLET (SUTURE) IMPLANT
SUTURE MNCRL 4-0 27XMF (SUTURE) IMPLANT
SYR 10ML LL (SYRINGE) IMPLANT
SYR 20ML LL LF (SYRINGE) ×2 IMPLANT
TRAP FLUID SMOKE EVACUATOR (MISCELLANEOUS) ×1 IMPLANT
TRAY FOLEY MTR SLVR 16FR STAT (SET/KITS/TRAYS/PACK) ×1 IMPLANT
WATER STERILE IRR 500ML POUR (IV SOLUTION) ×1 IMPLANT

## 2022-07-11 NOTE — Consult Note (Signed)
SURGICAL CONSULTATION NOTE   HISTORY OF PRESENT ILLNESS (HPI):  42 y.o. male presented to [facility] ED for evaluation of abdominal pain.   History was taken from admitted physician, GI physician and chart since patient was found intubated under anesthesia after upper endoscopy.  I received a call from anesthesiologist for urgent consult on the patient that was under anesthesia for endoscopic retrieval of stomach foreign object.  As per history patient swallowed 9 V batteries.  He was having abdominal pain.  He was brought from his group home.  At the ED he had abdominal x-ray that shows 3 9 V batteries in the stomach.  There is also screws in the ascending colon.  CT scan shows same findings without any sign of perforation.  Today I attempted to remove the batteries but due to the MI was unsuccessful.  Surgery is consulted by Dr. Marius Ditch in this context for evaluation and management of foreign object in the stomach.  PAST MEDICAL HISTORY (PMH):  Past Medical History:  Diagnosis Date   Intellectual disability    mild   Marijuana use    Personality disorder ([place])    PTSD (post-traumatic stress disorder)    Schizo-affective schizophrenia ([place]) 04/29/2018   Schizoaffective disorder, bipolar type ([place])      PAST SURGICAL HISTORY ([place]):  Past Surgical History:  Procedure Laterality Date   ESOPHAGOGASTRODUODENOSCOPY (EGD) WITH PROPOFOL N/A 04/30/2018   Procedure: ESOPHAGOGASTRODUODENOSCOPY (EGD) WITH PROPOFOL;  Surgeon: Otis Brace, MD;  Location: [place];  Service: Gastroenterology;  Laterality: N/A;   ESOPHAGOGASTRODUODENOSCOPY (EGD) WITH PROPOFOL N/A 01/20/2021   Procedure: ESOPHAGOGASTRODUODENOSCOPY (EGD) WITH PROPOFOL;  Surgeon: Lucilla Lame, MD;  [facility] ENDOSCOPY;  Service: Endoscopy;  Laterality: N/A;   FOREIGN BODY REMOVAL N/A 04/30/2018   Procedure: FOREIGN BODY REMOVAL;  Surgeon: Otis Brace, MD;  Location: [place];  Service: Gastroenterology;  Laterality:  N/A;   FRACTURE SURGERY      r ankle      MEDICATIONS:  Prior to Admission medications   Medication Sig Start Date End Date Taking? Authorizing Provider  benztropine (COGENTIN) 0.5 MG tablet Take 1 tablet (0.5 mg total) by mouth 2 (two) times daily. 02/17/21  Yes [name], Madie Reno, MD  Cholecalciferol (VITAMIN D3) 50 MCG (2000 UT) capsule Take 2,000 Units by mouth daily.   Yes [provider]  cloZAPine (CLOZARIL) 100 MG tablet Take 2.5 tablets (250 mg total) by mouth at bedtime. 05/10/18  Yes Oretha Milch D, MD  levothyroxine (SYNTHROID) 25 MCG tablet Take 25 mcg by mouth daily. 06/18/22  Yes [provider]  lithium 300 MG tablet Take 300 mg by mouth 2 (two) times daily. 06/18/22  Yes [provider]  lithium carbonate (ESKALITH) 450 MG CR tablet Take 2 tablets (900 mg total) by mouth at bedtime. 02/17/21  Yes [name], Madie Reno, MD  OLANZapine zydis (ZYPREXA) 20 MG disintegrating tablet Take 1 tablet (20 mg total) by mouth at bedtime. 02/17/21  Yes [name], Madie Reno, MD  oxybutynin (DITROPAN-XL) 10 MG 24 hr tablet Take 1 tablet (10 mg total) by mouth at bedtime. 02/17/21  Yes [name], Madie Reno, MD  paliperidone (INVEGA SUSTENNA) 234 MG/1.5ML SUSY injection Inject 234 mg into the muscle every 28 (twenty-eight) days. 02/26/21  Yes [name], Madie Reno, MD  pantoprazole (PROTONIX) 40 MG tablet Take 1 tablet (40 mg total) by mouth daily. 02/18/21  Yes [name], Madie Reno, MD  rosuvastatin (CRESTOR) 20 MG tablet Take 20 mg by mouth at bedtime. 06/18/22  Yes [provider]  sertraline (ZOLOFT) 100 MG tablet Take 1 tablet (100 mg total) by mouth daily. 02/18/21  Yes [name], Madie Reno, MD  traZODone (DESYREL) 100 MG tablet Take 1 tablet (100 mg total) by mouth at bedtime. 02/17/21  Yes [name], Madie Reno, MD  albuterol (PROVENTIL HFA;VENTOLIN HFA) 108 (90 Base) MCG/ACT inhaler Inhale 2 puffs into the lungs every 6 (six) hours as needed for wheezing or shortness of breath. Patient not taking:  Reported on 07/20/2021    [provider]  cetirizine (ZYRTEC) 10 MG tablet Take 10 mg by mouth daily. Patient not taking: Reported on 07/20/2021    [provider]     ALLERGIES:  Allergies  Allergen Reactions   Haldol [Haloperidol Lactate]    Haldol [Haloperidol Lactate] Other (See Comments)    Unknown   Shellfish Allergy Itching and Rash     SOCIAL HISTORY:  Social History   Socioeconomic History   Marital status: Single    Spouse name: Not on file   Number of children: Not on file   Years of education: Not on file   Highest education level: Not on file  Occupational History   Not on file  Tobacco Use   Smoking status: Every Day    Packs/day: 5.00    Types: Cigarettes, Cigars   Smokeless tobacco: Never  Vaping Use   Vaping Use: Some days   Substances: Nicotine  Substance and Sexual Activity   Alcohol use: Never   Drug use: Never    Types: Marijuana    Comment: 3 to 4 months ago stated 07/11/22   Sexual activity: Not Currently  Other Topics Concern   Not on file  Social History Narrative   ** Merged History Encounter **       Social Determinants of Health   Financial Resource Strain: Not on file  Food Insecurity: Not on file  Transportation Needs: Not on file  Physical Activity: Not on file  Stress: Not on file  Social Connections: Not on file  Intimate Partner Violence: Not on file      FAMILY HISTORY:  History reviewed. No pertinent family history.   REVIEW OF SYSTEMS: As per history on chart Constitutional: denies weight loss, fever, chills, or sweats  Eyes: denies any other vision changes, history of eye injury  ENT: denies sore throat, hearing problems  Respiratory: denies shortness of breath, wheezing  Cardiovascular: denies chest pain, palpitations  Gastrointestinal: Positive abdominal pain Genitourinary: denies burning with urination or urinary frequency Musculoskeletal: denies any other joint pains or cramps  Skin: denies  any other rashes or skin discolorations  Neurological: denies any other headache, dizziness, weakness  Psychiatric: denies any other depression, anxiety   All other review of systems were negative   VITAL SIGNS:  Temp:  [96.9 F (36.1 C)-98.9 F (37.2 C)] 96.9 F (36.1 C) (02/11 1258) Pulse Rate:  [82-94] 94 (02/11 1258) Resp:  [13-18] 13 (02/11 1258) BP: (103-124)/(62-79) 124/79 (02/11 1258) SpO2:  [97 %-100 %] 100 % (02/11 1258) Weight:  [104 kg] 104 kg (02/11 1258)     Height: 5' 10"$  (177.8 cm) Weight: 104 kg BMI (Calculated): 32.9   INTAKE/OUTPUT:  This shift: No intake/output data recorded.  Last 2 shifts: @IOLAST2SHIFTS$ @   PHYSICAL EXAM:  Constitutional:  -- Normal body habitus  -- Under anesthesia Eyes:  -- Pupils equally round and reactive to light  -- No scleral icterus  Ear, nose, and throat:  -- No jugular venous distension  Pulmonary:  --  No crackles  -- Equal breath sounds bilaterally -- Breathing non-labored at rest Cardiovascular:  -- S1, S2 present  -- No pericardial rubs Gastrointestinal:  -- Abdomen soft, nontender, non-distended, no guarding or rebound tenderness -- No abdominal masses appreciated, pulsatile or otherwise  Musculoskeletal and Integumentary:  -- Wounds: None appreciated -- Extremities: B/L UE and LE FROM, hands and feet warm, no edema  Neurologic:  -- Motor function: intact and symmetric -- Sensation: intact and symmetric   Labs:     Latest Ref Rng & Units 07/11/2022    4:36 AM 07/10/2022   11:34 PM 07/21/2021   12:49 PM  CBC  WBC 4.0 - 10.5 K/uL 8.9  11.9  5.1   Hemoglobin 13.0 - 17.0 g/dL 11.7  13.0  14.3   Hematocrit 39.0 - 52.0 % 37.9  41.3  45.3   Platelets 150 - 400 K/uL 210  235  228       Latest Ref Rng & Units 07/11/2022    4:36 AM 07/10/2022   11:34 PM 07/19/2021   10:40 PM  CMP  Glucose 70 - 99 mg/dL 113  93  96   BUN 6 - 20 mg/dL 10  11  16   $ Creatinine 0.61 - 1.24 mg/dL 0.90  1.01  0.98   Sodium 135 - 145  mmol/L 137  138  137   Potassium 3.5 - 5.1 mmol/L 3.8  3.7  4.0   Chloride 98 - 111 mmol/L 108  107  104   CO2 22 - 32 mmol/L 25  24  28   $ Calcium 8.9 - 10.3 mg/dL 8.9  9.5  9.1   Total Protein 6.5 - 8.1 g/dL  7.6  7.2   Total Bilirubin 0.3 - 1.2 mg/dL  0.6  0.5   Alkaline Phos 38 - 126 U/L  78  65   AST 15 - 41 U/L  24  16   ALT 0 - 44 U/L  24  14     Imaging studies:  EXAM: CT ABDOMEN AND PELVIS WITHOUT CONTRAST   TECHNIQUE: Multidetector CT imaging of the abdomen and pelvis was performed following the standard protocol without IV contrast.   RADIATION DOSE REDUCTION: This exam was performed according to the departmental dose-optimization program which includes automated exposure control, adjustment of the mA and/or kV according to patient size and/or use of iterative reconstruction technique.   COMPARISON:  X-ray abdomen 07/11/2022   FINDINGS: Lower chest: Bilateral trace pleural effusions. Trace hiatal hernia.   Hepatobiliary: No focal liver abnormality. No gallstones, gallbladder wall thickening, or pericholecystic fluid. No biliary dilatation.   Pancreas: No focal lesion. Normal pancreatic contour. No surrounding inflammatory changes. No main pancreatic ductal dilatation.   Spleen: Normal in size without focal abnormality.   Adrenals/Urinary Tract:   No adrenal nodule bilaterally.   No nephrolithiasis and no hydronephrosis. No definite contour-deforming renal mass.   No ureterolithiasis or hydroureter.   The urinary bladder is unremarkable.   Stomach/Bowel:   Three 5 x 2.5 cm rectangular batteries within the gastric lumen. Three 3 cm screws within the cecal lumen. Stomach is within normal limits. No evidence of bowel wall thickening or dilatation. Appendix appears normal.   Vascular/Lymphatic: No abdominal aorta or iliac aneurysm. No abdominal, pelvic, or inguinal lymphadenopathy.   Reproductive: Prostate is unremarkable.   Other: No intraperitoneal  free fluid. No intraperitoneal free gas. No organized fluid collection.   Musculoskeletal:   No abdominal wall hernia or abnormality.   No  suspicious lytic or blastic osseous lesions. No acute displaced fracture. Multilevel degenerative changes of the spine.   IMPRESSION: 1. Three 5 x 2.5 cm rectangular batteries within the gastric lumen. 2. Three 3 cm screws within the cecal lumen. 3. No bowel perforation. 4. Bilateral trace pleural effusions.     Electronically Signed   By: Iven Finn M.D.   On: 07/11/2022 01:12  Assessment/Plan:  42 y.o. male with foreign object in the stomach, complicated by pertinent comorbidities including acute schizoaffective disorder, bipolar type.  Patient with 3 9 V batteries in the stomach.  Unsuccessful endoscopic retrieval.  Patient was under anesthesia so unable to discussed with patient.  Also patient psychiatric status, he is unable to make his own decision.  Legal guardian was contacted, Lawrence Morgan, and he was oriented about the situation.  I discussed with legal guardian about the recommendation of surgical removal of the batteries.  These are large batteries that has been there for at least 3 to 4 days.  Patient rated with an ulcer in the stomach.  If they are not unable to pass which they have been passing at least 3 to 4 days they can the road and late acid content from the battery and cause perforation.  Lawrence Morgan, the legal guardian, reported he understood and agreed to proceed.  There was discussed with anesthesia that also discussed with regarding about the risk of surgery and anesthesia and consent was taken on the phone.   Arnold Long, MD

## 2022-07-11 NOTE — Assessment & Plan Note (Addendum)
-   This is considered a suicidal attempt swallowing 9Vl batteries and screws. Unsuccessful attempt to remove batteries which were found in stomach on EGD. -Will be going for surgical intervention. -Patient did admit to suicidal attempt-will be going to behavioral health after medical clearance

## 2022-07-11 NOTE — Assessment & Plan Note (Signed)
-   We will continue Synthroid. 

## 2022-07-11 NOTE — ED Notes (Signed)
EDP, Bradler at bedside; update provided to patient.

## 2022-07-11 NOTE — ED Notes (Addendum)
Pt ambulatory to bathroom at this time. Urine specimen given. Pt asked if he had "surgery" last night. Explained to pt that we are waiting on surgery to come see the pt to figure out a plan. Pt verbalized understanding at this time.

## 2022-07-11 NOTE — Consult Note (Signed)
Lawrence Darby, MD [address]  [place]  [place], [place] [address]  Main: [phone]  Fax: [phone] Pager: [phone]   Consultation  Referring Provider:     No ref. provider found Primary Care Physician:  Pcp, No Primary Gastroenterologist: Althia Forts       Reason for Consultation: Foreign body in stomach  Date of Admission:  07/10/2022 Date of Consultation:  07/11/2022         HPI:   Lawrence Morgan is a 42 y.o. male history of schizoaffective disorder, bipolar, PTSD, intellectual disability, history of marijuana use presented to ER from group home last night with his assistant because of acute onset of upper abdominal pain after ingesting 2 9V batteries.  Patient stated that he ingested the battery 3 days ago and he has pain in the periumbilical area.  He also swallowed 3 screws as well which is evident on the CT scan of the abdomen and pelvis that was performed in the ER.  Patient denies any nausea or vomiting.  Denies any other symptoms.  His vitals are stable, labs revealed mild leukocytosis.  Patient had similar presentation in 12/2020, underwent upper endoscopy by Dr. Allen Norris, battery was removed using an overtube  Patient also swallowed stone in 04/2018 and upper endoscopy was performed but not successfully removed  NSAIDs: None  Antiplts/Anticoagulants/Anti thrombotics: None   Past Medical History:  Diagnosis Date   Intellectual disability    mild   Marijuana use    Personality disorder (HCC)    PTSD (post-traumatic stress disorder)    Schizo-affective schizophrenia ([facility]) 04/29/2018   Schizoaffective disorder, bipolar type ([place])     Past Surgical History:  Procedure Laterality Date   ESOPHAGOGASTRODUODENOSCOPY (EGD) WITH PROPOFOL N/A 04/30/2018   Procedure: ESOPHAGOGASTRODUODENOSCOPY (EGD) WITH PROPOFOL;  Surgeon: Otis Brace, MD;  Location: [facility];  Service: Gastroenterology;  Laterality: N/A;   ESOPHAGOGASTRODUODENOSCOPY (EGD) WITH  PROPOFOL N/A 01/20/2021   Procedure: ESOPHAGOGASTRODUODENOSCOPY (EGD) WITH PROPOFOL;  Surgeon: Lucilla Lame, MD;  Location: [facility] ENDOSCOPY;  Service: Endoscopy;  Laterality: N/A;   FOREIGN BODY REMOVAL N/A 04/30/2018   Procedure: FOREIGN BODY REMOVAL;  Surgeon: Otis Brace, MD;  Location: [facility];  Service: Gastroenterology;  Laterality: N/A;   FRACTURE SURGERY      r ankle      Current Facility-Administered Medications:    0.9 %  sodium chloride infusion, , Intravenous, Continuous, Mansy, Jan A, MD, Last Rate: 100 mL/hr at 07/11/22 1258, Continued from Pre-op at 07/11/22 1258   0.9 %  sodium chloride infusion, , Intravenous, Continuous, , Tally Due, MD   [facility] Hold] acetaminophen (TYLENOL) tablet 650 mg, 650 mg, Oral, Q6H PRN **OR** [MAR Hold] acetaminophen (TYLENOL) suppository 650 mg, 650 mg, Rectal, Q6H PRN, Mansy, Arvella Merles, MD   [MAR Hold] albuterol (PROVENTIL) (2.5 MG/3ML) 0.083% nebulizer solution 2.5 mg, 2.5 mg, Nebulization, Q6H PRN, Renda Rolls, RPH   [MAR Hold] benztropine (COGENTIN) tablet 0.5 mg, 0.5 mg, Oral, BID, Mansy, Arvella Merles, MD   [MAR Hold] cholecalciferol (VITAMIN D3) 25 MCG (1000 UNIT) tablet 2,000 Units, 2,000 Units, Oral, Daily, Mansy, Jan A, MD   [facility] Hold] docusate sodium (COLACE) capsule 200 mg, 200 mg, Oral, BID, Mansy, Jan A, MD   [facility] Hold] enoxaparin (LOVENOX) injection 52.5 mg, 0.5 mg/kg, Subcutaneous, Q24H, Mansy, Jan A, MD   [facility] Hold] ferrous sulfate tablet 325 mg, 325 mg, Oral, Q1200, Mansy, Arvella Merles, MD   [facility] Hold] hydrOXYzine (ATARAX) tablet 50 mg, 50 mg,  Oral, Q6H PRN, Mansy, Arvella Merles, MD   [MAR Hold] lithium carbonate (ESKALITH) ER tablet 900 mg, 900 mg, Oral, QHS, Belue, Alver Sorrow, RPH   [facility] Hold] lithium carbonate capsule 300 mg, 300 mg, Oral, BID, Renda Rolls, RPH   [MAR Hold] morphine (PF) 2 MG/ML injection 2 mg, 2 mg, Intravenous, Q4H PRN, Mansy, Arvella Merles, MD   [MAR Hold] OLANZapine zydis (ZYPREXA) disintegrating tablet 20 mg, 20 mg, Oral,  QHS, Mansy, Arvella Merles, MD   [MAR Hold] ondansetron (ZOFRAN) tablet 4 mg, 4 mg, Oral, Q6H PRN **OR** [MAR Hold] ondansetron (ZOFRAN) injection 4 mg, 4 mg, Intravenous, Q6H PRN, Mansy, Arvella Merles, MD   [[facility] Hold] oxybutynin (DITROPAN-XL) 24 hr tablet 10 mg, 10 mg, Oral, QHS, Mansy, Jan A, MD   [MAR Hold] pantoprazole (PROTONIX) EC tablet 40 mg, 40 mg, Oral, Daily, Mansy, Jan A, MD   [facility] Hold] polyethylene glycol (MIRALAX / GLYCOLAX) packet 17 g, 17 g, Oral, Daily, Mansy, Jan A, MD   [facility] Hold] sertraline (ZOLOFT) tablet 100 mg, 100 mg, Oral, Daily, Mansy, Jan A, MD   [MAR Hold] traZODone (DESYREL) tablet 100 mg, 100 mg, Oral, QHS, Mansy, Jan A, MD   [facility] Hold] traZODone (DESYREL) tablet 25 mg, 25 mg, Oral, QHS PRN, Mansy, Arvella Merles, MD   [facility] Hold] ziprasidone (GEODON) injection 10 mg, 10 mg, Intramuscular, Q6H PRN, Mansy, Arvella Merles, MD   History reviewed. No pertinent family history.   Social History   Tobacco Use   Smoking status: Every Day    Packs/day: 5.00    Types: Cigarettes, Cigars   Smokeless tobacco: Never  Vaping Use   Vaping Use: Some days   Substances: Nicotine  Substance Use Topics   Alcohol use: Never   Drug use: Never    Types: Marijuana    Comment: 3 to 4 months ago stated 07/11/22    Allergies as of 07/10/2022 - Review Complete 07/10/2022  Allergen Reaction Noted   Haldol [haloperidol lactate]  02/02/2017   Haldol [haloperidol lactate] Other (See Comments) 01/21/2021   Shellfish allergy Itching and Rash 04/29/2018    Review of Systems:    All systems reviewed and negative except where noted in HPI.   Physical Exam:  Vital signs in last 24 hours: Temp:  [96.9 F (36.1 C)-98.9 F (37.2 C)] 96.9 F (36.1 C) (02/11 1258) Pulse Rate:  [82-94] 94 (02/11 1258) Resp:  [13-18] 13 (02/11 1258) BP: (103-124)/(62-79) 124/79 (02/11 1258) SpO2:  [97 %-100 %] 100 % (02/11 1258) Weight:  [104 kg] 104 kg (02/11 1258)   General:   Pleasant, cooperative in NAD Head:  Normocephalic  and atraumatic. Eyes:   No icterus.   Conjunctiva pink. PERRLA. Ears:  Normal auditory acuity. Neck:  Supple; no masses or thyroidomegaly Lungs: Respirations even and unlabored. Lungs clear to auscultation bilaterally.   No wheezes, crackles, or rhonchi.  Heart:  Regular rate and rhythm;  Without murmur, clicks, rubs or gallops Abdomen:  Soft, nondistended, nontender. Normal bowel sounds. No appreciable masses or hepatomegaly.  No rebound or guarding.  Rectal:  Not performed. Msk:  Symmetrical without gross deformities.  Strength normal Extremities:  Without edema, cyanosis or clubbing. Neurologic:  Alert and oriented x3;  grossly normal neurologically. Skin:  Intact without significant lesions or rashes. Psych:  Alert and cooperative. Normal affect.  LAB RESULTS:    Latest Ref Rng & Units 07/11/2022    4:36 AM 07/10/2022   11:34 PM 07/21/2021   12:49 PM  CBC  WBC 4.0 - 10.5 K/uL 8.9  11.9  5.1   Hemoglobin 13.0 - 17.0 g/dL 11.7  13.0  14.3   Hematocrit 39.0 - 52.0 % 37.9  41.3  45.3   Platelets 150 - 400 K/uL 210  235  228     BMET    Latest Ref Rng & Units 07/11/2022    4:36 AM 07/10/2022   11:34 PM 07/19/2021   10:40 PM  BMP  Glucose 70 - 99 mg/dL 113  93  96   BUN 6 - 20 mg/dL 10  11  16   $ Creatinine 0.61 - 1.24 mg/dL 0.90  1.01  0.98   Sodium 135 - 145 mmol/L 137  138  137   Potassium 3.5 - 5.1 mmol/L 3.8  3.7  4.0   Chloride 98 - 111 mmol/L 108  107  104   CO2 22 - 32 mmol/L 25  24  28   $ Calcium 8.9 - 10.3 mg/dL 8.9  9.5  9.1     LFT    Latest Ref Rng & Units 07/10/2022   11:34 PM 07/19/2021   10:40 PM 01/20/2021    2:04 PM  Hepatic Function  Total Protein 6.5 - 8.1 g/dL 7.6  7.2  7.4   Albumin 3.5 - 5.0 g/dL 4.6  4.1  4.7   AST 15 - 41 U/L 24  16  19   $ ALT 0 - 44 U/L 24  14  14   $ Alk Phosphatase 38 - 126 U/L 78  65  73   Total Bilirubin 0.3 - 1.2 mg/dL 0.6  0.5  0.6      STUDIES: CT ABDOMEN PELVIS WO CONTRAST  Result Date: 07/11/2022 CLINICAL DATA:   Abdominal pain, acute, nonlocalized 3 batteries and 3 screws EXAM: CT ABDOMEN AND PELVIS WITHOUT CONTRAST TECHNIQUE: Multidetector CT imaging of the abdomen and pelvis was performed following the standard protocol without IV contrast. RADIATION DOSE REDUCTION: This exam was performed according to the departmental dose-optimization program which includes automated exposure control, adjustment of the mA and/or kV according to patient size and/or use of iterative reconstruction technique. COMPARISON:  X-ray abdomen 07/11/2022 FINDINGS: Lower chest: Bilateral trace pleural effusions. Trace hiatal hernia. Hepatobiliary: No focal liver abnormality. No gallstones, gallbladder wall thickening, or pericholecystic fluid. No biliary dilatation. Pancreas: No focal lesion. Normal pancreatic contour. No surrounding inflammatory changes. No main pancreatic ductal dilatation. Spleen: Normal in size without focal abnormality. Adrenals/Urinary Tract: No adrenal nodule bilaterally. No nephrolithiasis and no hydronephrosis. No definite contour-deforming renal mass. No ureterolithiasis or hydroureter. The urinary bladder is unremarkable. Stomach/Bowel: Three 5 x 2.5 cm rectangular batteries within the gastric lumen. Three 3 cm screws within the cecal lumen. Stomach is within normal limits. No evidence of bowel wall thickening or dilatation. Appendix appears normal. Vascular/Lymphatic: No abdominal aorta or iliac aneurysm. No abdominal, pelvic, or inguinal lymphadenopathy. Reproductive: Prostate is unremarkable. Other: No intraperitoneal free fluid. No intraperitoneal free gas. No organized fluid collection. Musculoskeletal: No abdominal wall hernia or abnormality. No suspicious lytic or blastic osseous lesions. No acute displaced fracture. Multilevel degenerative changes of the spine. IMPRESSION: 1. Three 5 x 2.5 cm rectangular batteries within the gastric lumen. 2. Three 3 cm screws within the cecal lumen. 3. No bowel perforation. 4.  Bilateral trace pleural effusions. Electronically Signed   By: Iven Finn M.D.   On: 07/11/2022 01:12   DG Abdomen 1 View  Result Date: 07/11/2022 CLINICAL DATA:  ingested 4 9v batteries 2  days ago and only passed 2 EXAM: ABDOMEN - 1 VIEW COMPARISON:  X-ray abdomen 07/21/2021 FINDINGS: Total of 3 screws overlying the right lower abdomen are noted (measuring approximately 4 cm). Total of 3 rectangular batteries (measuring approximately 6.5 x 3.5 cm) overlying the mid abdomen. Previously identified longer screw measuring 7.5 cm on radiograph 02/29/2023 no longer visualized. The bowel gas pattern is normal. No findings to suggest pneumoperitoneum; however, limited evaluation on this supine radiograph. IMPRESSION: 1. Total of 3 screws overlying the right lower abdomen are noted (measuring approximately 4 cm). 2. Total of 3 rectangular batteries (measuring approximately 6.5 x 3.5 cm) overlying the mid abdomen. 3. Nonobstructive bowel gas pattern. 4. No findings to suggest pneumoperitoneum/bowel perforation with markedly limited evaluation on this supine radiograph. Recommend CT abdomen/pelvis with intravenous contrast for further evaluation. These results were called by telephone at the time of interpretation on 07/11/2022 at 12:09 am to provider [facility] , who verbally acknowledged these results. Electronically Signed   By: Iven Finn M.D.   On: 07/11/2022 00:10      Impression / Plan:   Lawrence Morgan is a 42 y.o. male with history of schizoaffective disorder, bipolar, known history of foreign body ingestion presents with upper abdominal discomfort after ingesting batteries 3 days ago.  He also ingested screws.  Cross-sectional imaging revealed batteries in the stomach and screws in the cecum.  Patient denies any abdominal pain.  Patient is n.p.o., recommend EGD for foreign body removal  I have discussed alternative options, risks & benefits,  which include, but are not limited to, bleeding,  infection, perforation,respiratory complication & drug reaction.  The patient agrees with this plan & written consent will be obtained.     Thank you for involving me in the care of this patient.      LOS: 0 days   Sherri Sear, MD  07/11/2022, 1:32 PM    Note: This dictation was prepared with Dragon dictation along with smaller phrase technology. Any transcriptional errors that result from this process are unintentional.

## 2022-07-11 NOTE — ED Notes (Signed)
Pt requesting something to eat, explained to pt that he is NPO pending a procedure. Pt verbalized understanding at this time.

## 2022-07-11 NOTE — Op Note (Addendum)
[facility] Gastroenterology Patient Name: Lawrence Morgan Procedure Date: 07/11/2022 11:59 AM MRN: [id number] Account #: [id number] Date of Birth: 08/15/1980 Admit Type: Inpatient Age: 42 Room: [facility] ENDO ROOM 4 Gender: Male Note Status: Finalized Instrument Name: Upper Endoscope [id number] Procedure:             Upper GI endoscopy Indications:           Foreign body in the stomach Providers:             Lin Landsman MD, MD Referring MD:          no PCP Medicines:             General Anesthesia Complications:         No immediate complications. Estimated blood loss: None. Procedure:             Pre-Anesthesia Assessment:                        - Prior to the procedure, a History and Physical was                         performed, and patient medications and allergies were                         reviewed. The patient is competent. The risks and                         benefits of the procedure and the sedation options and                         risks were discussed with the patient. All questions                         were answered and informed consent was obtained.                         Patient identification and proposed procedure were                         verified by the physician, the nurse, the                         anesthesiologist, the anesthetist and the technician                         in the pre-procedure area in the procedure room in the                         endoscopy suite. Mental Status Examination: alert and                         oriented. Airway Examination: normal oropharyngeal                         airway and neck mobility. Respiratory Examination:                         clear to auscultation. CV Examination: normal.  Prophylactic Antibiotics: The patient does not require                         prophylactic antibiotics. Prior Anticoagulants: The                         patient has taken no anticoagulant  or antiplatelet                         agents. ASA Grade Assessment: II - A patient with mild                         systemic disease. After reviewing the risks and                         benefits, the patient was deemed in satisfactory                         condition to undergo the procedure. The anesthesia                         plan was to use general anesthesia. Immediately prior                         to administration of medications, the patient was                         re-assessed for adequacy to receive sedatives. The                         heart rate, respiratory rate, oxygen saturations,                         blood pressure, adequacy of pulmonary ventilation, and                         response to care were monitored throughout the                         procedure. The physical status of the patient was                         re-assessed after the procedure.                        After obtaining informed consent, the endoscope was                         passed under direct vision. Throughout the procedure,                         the patient's blood pressure, pulse, and oxygen                         saturations were monitored continuously. The Endoscope                         was introduced through the mouth, and advanced to the  second part of duodenum. The upper GI endoscopy was                         accomplished without difficulty. The patient tolerated                         the procedure well. Findings:      The duodenal bulb and second portion of the duodenum were normal.      Three 9V batteries were found in the gastric body. Removal was not       accomplished with an overtube, Roth net and snare despite several       attempts. Estimated blood loss was minimal. Atleast 84mn was spent       trying to remove the battery      One non-bleeding cratered gastric ulcer with a clean ulcer base (Forrest       Class III) was found on  the greater curvature of the gastric body. The       lesion was 15 mm in largest dimension.      The cardia and gastric fundus were normal on retroflexion.      The gastroesophageal junction and examined esophagus were normal. Impression:            - Normal duodenal bulb and second portion of the                         duodenum.                        - Three 9V batteries were found in the stomach.                         Removal was not successful.                        - Non-bleeding gastric ulcer with a clean ulcer base                         (Forrest Class III).                        - Normal gastroesophageal junction and esophagus. Recommendation:        - Return patient to hospital ward for ongoing care.                        - NPO today.                        - Consult surgeon for surgical removal of batteries. Procedure Code(s):     --- Professional ---                        [phone] Esophagogastroduodenoscopy, flexible,                         transoral; with removal of foreign body(s) Diagnosis Code(s):     --- Professional ---                        [id number] Foreign body in stomach, initial encounter  K25.9, Gastric ulcer, unspecified as acute or chronic,                         without hemorrhage or perforation CPT copyright 2022 American Medical Association. All rights reserved. The codes documented in this report are preliminary and upon coder review may  be revised to meet current compliance requirements. Dr. Ulyess Mort Lin Landsman MD, MD 07/11/2022 3:03:09 PM This report has been signed electronically. Number of Addenda: 0 Note Initiated On: 07/11/2022 11:59 AM Estimated Blood Loss:  Estimated blood loss: none.      [facility]

## 2022-07-11 NOTE — Op Note (Signed)
Preoperative diagnosis: Foreign object in the stomach  Postoperative diagnosis: Same.   Procedure: Exploratory laparotomy                     Gastrotomy with removal of 3 ingested foreign object.  Anesthesia: GETA  Surgeon: Dr. Windell Moment, MD  Wound Classification: Clean Contaminated  Indications: Patient is a 42 y.o. male who ingested 3 9 V batteries.  Unable to be removed endoscopically.  Due to high risk of perforation decision was to proceed with surgical removal.  Findings: 1. Three 9 V batteries were removed no other foreign objects were able to be palpated 2. Normal gastric anatomy 3. Adequate hemostasis    Description of procedure: The patient was placed in the supine position and general endotracheal anesthesia was already induced induced, patient coming from endoscopy. A time-out was completed verifying correct patient, procedure, site, positioning, and implant(s) and/or special equipment prior to beginning this procedure. Preoperative antibiotics were given. The abdomen was prepped and draped in the usual sterile fashion. A short upper midline incision was made and deepened through the subcutaneous tissues with electrocautery. Hemostasis was assured. The linea alba was incised and the peritoneal cavity entered.  The stomach was identified and a location on the anterior wall near the greater curvature was selected.  At 3 cm gastrostomy was performed in the anterior wall.  3 9 V batteries were able to be removed.  The gastrotomy was closed in a 2 layer fashion.  First layer was closed with a 3 oh V-Loc.  Second liter was close using a Lembert fashion sutures with 3-0 Vicryl.  Hemostasis was checked and omentum was brought adjacent to the surgical field. The fascia was closed with a running suture of 0-Stratafix.  The skin was closed with subcuticular sutures of Monocryl 3-0.   The patient tolerated the procedure well and was taken to the postanesthesia care unit in stable  condition  Specimen: 9V batteries  Complications: None  EBL: 5 mL

## 2022-07-11 NOTE — ED Notes (Signed)
VOL/Pending psych consult

## 2022-07-11 NOTE — Anesthesia Procedure Notes (Signed)
Procedure Name: Intubation Date/Time: 07/11/2022 1:40 PM  Performed by: Aline Brochure, CRNAPre-anesthesia Checklist: Patient identified, Patient being monitored, Timeout performed, Emergency Drugs available and Suction available Patient Re-evaluated:Patient Re-evaluated prior to induction Oxygen Delivery Method: Circle system utilized Preoxygenation: Pre-oxygenation with 100% oxygen Induction Type: IV induction Ventilation: Mask ventilation without difficulty Laryngoscope Size: McGraph and 4 Grade View: Grade I Tube type: Oral Tube size: 7.0 mm Number of attempts: 1 Airway Equipment and Method: Stylet and Video-laryngoscopy Placement Confirmation: ETT inserted through vocal cords under direct vision, positive ETCO2 and breath sounds checked- equal and bilateral Secured at: 23 cm Tube secured with: Tape Dental Injury: Teeth and Oropharynx as per pre-operative assessment

## 2022-07-11 NOTE — H&P (Signed)
[place]   PATIENT NAME: Lawrence Morgan    MR#:  [id number]  DATE OF BIRTH:  [birth year]  DATE OF ADMISSION:  07/10/2022  PRIMARY CARE PHYSICIAN: Pcp, No   Patient is coming from: [place]  REQUESTING/REFERRING PHYSICIAN: Valora Piccolo, MD  CHIEF COMPLAINT:   Chief Complaint  Patient presents with   Abdominal Pain    HISTORY OF PRESENT ILLNESS:  Maykol Sporn is a 42 y.o. male with medical history significant for schizoaffective disorder bipolar type, PTSD, marijuana abuse and intellectual disability, who presented to the emergency room from his group home with his assistant with acute onset of abdominal pain after ingesting 49V batteries.  He stated that he had at that 3 days ago and presents today with constant pain in the periumbilical area.  He apparently swallowed 3 screws as well.  He denies any nausea or vomiting or diarrhea.  No chest pain or dyspnea or cough.  No headache or dizziness or blurred vision.  No bleeding diathesis.  No dysuria, oliguria or hematuria or flank pain.  ED Course: When he came to the ER vital signs were within normal.  Labs revealed normal CMP and CBC showed no leukocytosis of 11.9.  Tylenol level was less than 10 and salicylate less than 7.  His influenza COVID-19 and RSV PCR came back negative  EKG as reviewed by me :  EKG showed normal sinus rhythm with a rate of 85 Imaging: 1 abdomen x-ray revealed the following: 1. Total of 3 screws overlying the right lower abdomen are noted (measuring approximately 4 cm). 2. Total of 3 rectangular batteries (measuring approximately 6.5 x 3.5 cm) overlying the mid abdomen. 3. Nonobstructive bowel gas pattern. 4. No findings to suggest pneumoperitoneum/bowel perforation with markedly limited evaluation on this supine radiograph. Recommend CT abdomen/pelvis with intravenous contrast for further evaluation.  Abdominal and pelvic CT scan without contrast revealed the following: 1. Three 5 x 2.5 cm  rectangular batteries within the gastric lumen. 2. Three 3 cm screws within the cecal lumen. 3. No bowel perforation. 4. Bilateral trace pleural effusions.  Dr. Marius Ditch was contacted about the patient and will plan for EGD in a.m.  The patient will be admitted to an observation medical bed for further evaluation and management. PAST MEDICAL HISTORY:   Past Medical History:  Diagnosis Date   Intellectual disability    mild   Marijuana use    Personality disorder ([place])    PTSD (post-traumatic stress disorder)    Schizo-affective schizophrenia ([place]) 04/29/2018   Schizoaffective disorder, bipolar type ([place])     PAST SURGICAL HISTORY:   Past Surgical History:  Procedure Laterality Date   ESOPHAGOGASTRODUODENOSCOPY (EGD) WITH PROPOFOL N/A 04/30/2018   Procedure: ESOPHAGOGASTRODUODENOSCOPY (EGD) WITH PROPOFOL;  Surgeon: Otis Brace, MD;  Location: [facility];  Service: Gastroenterology;  Laterality: N/A;   ESOPHAGOGASTRODUODENOSCOPY (EGD) WITH PROPOFOL N/A 01/20/2021   Procedure: ESOPHAGOGASTRODUODENOSCOPY (EGD) WITH PROPOFOL;  Surgeon: Lucilla Lame, MD;  Location: [facility] ENDOSCOPY;  Service: Endoscopy;  Laterality: N/A;   FOREIGN BODY REMOVAL N/A 04/30/2018   Procedure: FOREIGN BODY REMOVAL;  Surgeon: Otis Brace, MD;  Location: [facility];  Service: Gastroenterology;  Laterality: N/A;   FRACTURE SURGERY      r ankle     SOCIAL HISTORY:   Social History   Tobacco Use   Smoking status: Every Day    Packs/day: 1.50    Types: Cigarettes, Cigars   Smokeless tobacco: Never  Substance Use Topics   Alcohol  use: Never    FAMILY HISTORY:  History reviewed. No pertinent family history.  DRUG ALLERGIES:   Allergies  Allergen Reactions   Haldol [Haloperidol Lactate]    Haldol [Haloperidol Lactate] Other (See Comments)    Unknown   Shellfish Allergy Itching and Rash    REVIEW OF SYSTEMS:   ROS As per history of present illness. All pertinent systems were reviewed  above. Constitutional, HEENT, cardiovascular, respiratory, GI, GU, musculoskeletal, neuro, psychiatric, endocrine, integumentary and hematologic systems were reviewed and are otherwise negative/unremarkable except for positive findings mentioned above in the HPI.   MEDICATIONS AT HOME:   Prior to Admission medications   Medication Sig Start Date End Date Taking? Authorizing Provider  albuterol (PROVENTIL HFA;VENTOLIN HFA) 108 (90 Base) MCG/ACT inhaler Inhale 2 puffs into the lungs every 6 (six) hours as needed for wheezing or shortness of breath. Patient not taking: Reported on 07/20/2021    [provider]  atomoxetine (STRATTERA) 25 MG capsule Take 25 mg by mouth daily. Patient not taking: Reported on 07/20/2021    [provider]  benztropine (COGENTIN) 0.5 MG tablet Take 0.5 mg by mouth daily. Patient not taking: Reported on 07/20/2021    [provider]  benztropine (COGENTIN) 0.5 MG tablet Take 1 tablet (0.5 mg total) by mouth 2 (two) times daily. 02/17/21   [name], Madie Reno, MD  cetirizine (ZYRTEC) 10 MG tablet Take 10 mg by mouth daily. Patient not taking: Reported on 07/20/2021    [provider]  Cholecalciferol (VITAMIN D3) 50 MCG (2000 UT) capsule Take 2,000 Units by mouth daily.    [provider]  cloZAPine (CLOZARIL) 100 MG tablet Take 2.5 tablets (250 mg total) by mouth at bedtime. Patient not taking: Reported on 07/20/2021 05/10/18   Desiree Hane, MD  cloZAPine (CLOZARIL) 50 MG tablet Take 3 tablets (150 mg total) by mouth at bedtime. Patient taking differently: Take 50 mg by mouth at bedtime. 02/17/21   [name], Madie Reno, MD  docusate sodium (COLACE) 100 MG capsule Take 2 capsules (200 mg total) by mouth 2 (two) times daily. Patient taking differently: Take 100 mg by mouth 2 (two) times daily. 02/17/21   [name], Madie Reno, MD  Docusate Sodium 100 MG capsule Take 200 mg by mouth 2 (two) times daily.    [provider]  ferrous  sulfate 325 (65 FE) MG tablet Take 325 mg by mouth daily with breakfast.    [provider]  hydrOXYzine (ATARAX/VISTARIL) 25 MG tablet Take 1 tablet (25 mg total) by mouth 3 (three) times daily. Patient not taking: Reported on 07/20/2021 06/05/18   Patrecia Pour, NP  hydrOXYzine (ATARAX/VISTARIL) 50 MG tablet Take 1 tablet (50 mg total) by mouth every 6 (six) hours as needed for anxiety. 02/17/21   [name], Madie Reno, MD  ibuprofen (ADVIL) 600 MG tablet Take 600 mg by mouth every 6 (six) hours as needed. 02/18/21   [provider]  lidocaine (LIDODERM) 5 % Place 1 patch onto the skin daily. Remove & Discard patch within 12 hours or as directed by MD 02/17/21   [name], Madie Reno, MD  lithium carbonate (ESKALITH) 450 MG CR tablet Take 2 tablets (900 mg total) by mouth at bedtime. 02/17/21   [name], Madie Reno, MD  lithium carbonate (LITHOBID) 300 MG CR tablet Take 1 tablet (300 mg total) by mouth daily with breakfast. Patient not taking: Reported on 07/20/2021 06/06/18   Patrecia Pour, NP  lithium carbonate (LITHOBID) 300 MG  CR tablet Take 2 tablets (600 mg total) by mouth at bedtime. Patient not taking: Reported on 07/20/2021 06/05/18   Patrecia Pour, NP  lithium carbonate (LITHOBID) 300 MG CR tablet Take 2 tablets (600 mg total) by mouth in the morning. 02/18/21   [name], Madie Reno, MD  OLANZapine zydis (ZYPREXA) 20 MG disintegrating tablet Take 1 tablet (20 mg total) by mouth at bedtime. 02/17/21   [name], Madie Reno, MD  omeprazole (PRILOSEC) 20 MG capsule Take 20 mg by mouth 2 (two) times daily before a meal. Patient not taking: Reported on 07/20/2021    [provider]  oxybutynin (DITROPAN-XL) 10 MG 24 hr tablet Take 1 tablet (10 mg total) by mouth at bedtime. 02/17/21   [name], Madie Reno, MD  paliperidone (INVEGA SUSTENNA) 234 MG/1.5ML SUSY injection Inject 234 mg into the muscle every 30 (thirty) days. Patient not taking: Reported on 07/20/2021    [provider]  paliperidone  (INVEGA SUSTENNA) 234 MG/1.5ML SUSY injection Inject 234 mg into the muscle every 28 (twenty-eight) days. 02/26/21   [name], Madie Reno, MD  pantoprazole (PROTONIX) 40 MG tablet Take 1 tablet (40 mg total) by mouth daily. 02/18/21   [name], Madie Reno, MD  polyethylene glycol (MIRALAX / GLYCOLAX) 17 g packet Take 17 g by mouth daily. 02/18/21   [name], Madie Reno, MD  sertraline (ZOLOFT) 100 MG tablet Take 100 mg by mouth daily. Patient not taking: Reported on 07/20/2021    [provider]  sertraline (ZOLOFT) 100 MG tablet Take 1 tablet (100 mg total) by mouth daily. 02/18/21   [name], Madie Reno, MD  traZODone (DESYREL) 100 MG tablet Take 1 tablet (100 mg total) by mouth at bedtime. 02/17/21   [name], Madie Reno, MD  ziprasidone (GEODON) 20 MG injection Inject into the muscle. Patient not taking: Reported on 07/20/2021 02/19/21   [provider]      VITAL SIGNS:  Blood pressure 116/71, pulse 86, temperature 98.7 F (37.1 C), temperature source Oral, resp. rate 18, weight 104 kg, SpO2 97 %.  PHYSICAL EXAMINATION:  Physical Exam  GENERAL:  42 y.o.-year-old patient lying in the bed with no acute distress.  EYES: Pupils equal, round, reactive to light and accommodation. No scleral icterus. Extraocular muscles intact.  HEENT: Head atraumatic, normocephalic. Oropharynx and nasopharynx clear.  NECK:  Supple, no jugular venous distention. No thyroid enlargement, no tenderness.  LUNGS: Normal breath sounds bilaterally, no wheezing, rales,rhonchi or crepitation. No use of accessory muscles of respiration.  CARDIOVASCULAR: Regular rate and rhythm, S1, S2 normal. No murmurs, rubs, or gallops.  ABDOMEN: Soft, nondistended, nontender. Bowel sounds present. No organomegaly or mass.  EXTREMITIES: No pedal edema, cyanosis, or clubbing.  NEUROLOGIC: Cranial nerves II through XII are intact. Muscle strength 5/5 in all extremities. Sensation intact. Gait not checked.  PSYCHIATRIC: The patient is alert and  oriented x 3.  Normal affect and good eye contact. SKIN: No obvious rash, lesion, or ulcer.   LABORATORY PANEL:   CBC Recent Labs  Lab 07/10/22 2334  WBC 11.9*  HGB 13.0  HCT 41.3  PLT 235   ------------------------------------------------------------------------------------------------------------------  Chemistries  Recent Labs  Lab 07/10/22 2334  NA 138  K 3.7  CL 107  CO2 24  GLUCOSE 93  BUN 11  CREATININE 1.01  CALCIUM 9.5  AST 24  ALT 24  ALKPHOS 78  BILITOT 0.6   ------------------------------------------------------------------------------------------------------------------  Cardiac Enzymes No results for input(s): "TROPONINI" in the last 168 hours. ------------------------------------------------------------------------------------------------------------------  RADIOLOGY:  CT ABDOMEN PELVIS WO CONTRAST  Result Date: 07/11/2022 CLINICAL DATA:  Abdominal pain, acute, nonlocalized 3 batteries and 3 screws EXAM: CT ABDOMEN AND PELVIS WITHOUT CONTRAST TECHNIQUE: Multidetector CT imaging of the abdomen and pelvis was performed following the standard protocol without IV contrast. RADIATION DOSE REDUCTION: This exam was performed according to the departmental dose-optimization program which includes automated exposure control, adjustment of the mA and/or kV according to patient size and/or use of iterative reconstruction technique. COMPARISON:  X-ray abdomen 07/11/2022 FINDINGS: Lower chest: Bilateral trace pleural effusions. Trace hiatal hernia. Hepatobiliary: No focal liver abnormality. No gallstones, gallbladder wall thickening, or pericholecystic fluid. No biliary dilatation. Pancreas: No focal lesion. Normal pancreatic contour. No surrounding inflammatory changes. No main pancreatic ductal dilatation. Spleen: Normal in size without focal abnormality. Adrenals/Urinary Tract: No adrenal nodule bilaterally. No nephrolithiasis and no hydronephrosis. No definite  contour-deforming renal mass. No ureterolithiasis or hydroureter. The urinary bladder is unremarkable. Stomach/Bowel: Three 5 x 2.5 cm rectangular batteries within the gastric lumen. Three 3 cm screws within the cecal lumen. Stomach is within normal limits. No evidence of bowel wall thickening or dilatation. Appendix appears normal. Vascular/Lymphatic: No abdominal aorta or iliac aneurysm. No abdominal, pelvic, or inguinal lymphadenopathy. Reproductive: Prostate is unremarkable. Other: No intraperitoneal free fluid. No intraperitoneal free gas. No organized fluid collection. Musculoskeletal: No abdominal wall hernia or abnormality. No suspicious lytic or blastic osseous lesions. No acute displaced fracture. Multilevel degenerative changes of the spine. IMPRESSION: 1. Three 5 x 2.5 cm rectangular batteries within the gastric lumen. 2. Three 3 cm screws within the cecal lumen. 3. No bowel perforation. 4. Bilateral trace pleural effusions. Electronically Signed   By: Iven Finn M.D.   On: 07/11/2022 01:12   DG Abdomen 1 View  Result Date: 07/11/2022 CLINICAL DATA:  ingested 4 9v batteries 2 days ago and only passed 2 EXAM: ABDOMEN - 1 VIEW COMPARISON:  X-ray abdomen 07/21/2021 FINDINGS: Total of 3 screws overlying the right lower abdomen are noted (measuring approximately 4 cm). Total of 3 rectangular batteries (measuring approximately 6.5 x 3.5 cm) overlying the mid abdomen. Previously identified longer screw measuring 7.5 cm on radiograph 02/29/2023 no longer visualized. The bowel gas pattern is normal. No findings to suggest pneumoperitoneum; however, limited evaluation on this supine radiograph. IMPRESSION: 1. Total of 3 screws overlying the right lower abdomen are noted (measuring approximately 4 cm). 2. Total of 3 rectangular batteries (measuring approximately 6.5 x 3.5 cm) overlying the mid abdomen. 3. Nonobstructive bowel gas pattern. 4. No findings to suggest pneumoperitoneum/bowel perforation with  markedly limited evaluation on this supine radiograph. Recommend CT abdomen/pelvis with intravenous contrast for further evaluation. These results were called by telephone at the time of interpretation on 07/11/2022 at 12:09 am to provider [facility] , who verbally acknowledged these results. Electronically Signed   By: Iven Finn M.D.   On: 07/11/2022 00:10      IMPRESSION AND PLAN:  Assessment and Plan: Foreign body ingestion, initial encounter - This is considered a suicidal attempt swallowing nonverbal batteries and screws. - She will be admitted to an observation medical bed. - GI consult to be obtained for EGD. - We will allow his close you are currently in the cecum to pass. - Psychiatry consult will be obtained. - I notified Dr. Louis Meckel about the patient. - Pain management to be provided.  Dyslipidemia - We will continue statin therapy.  Hypothyroidism - We will continue Synthroid.  Schizoaffective disorder, bipolar type ([place]) - We will continue  his psychotropic medications. - Psychiatry consult will be obtained.  Depression - We will continue his Zoloft and trazodone as well as his clozapine with Cogentin.Marland Kitchen  GERD without esophagitis - We will continue his PPI therapy.   DVT prophylaxis: Lovenox.  Advanced Care Planning:  Code Status: full code.  Family Communication:  The plan of care was discussed in details with the patient (and family). I answered all questions. The patient agreed to proceed with the above mentioned plan. Further management will depend upon hospital course. Disposition Plan: Back to previous home environment Consults called: GI and psychiatry All the records are reviewed and case discussed with ED provider.  Status is: Observation  I certify that at the time of admission, it is my clinical judgment that the patient will require hospital care extending less than 2 midnights.                            Dispo: The patient is from: Home               Anticipated d/c is to: Home              Patient currently is not medically stable to d/c.              Difficult to place patient: No  Christel Mormon M.D on 07/11/2022 at 4:07 AM  [facility]   From 7 PM-7 AM, contact night-coverage [email or web address]  CC: Primary care physician; Pcp, No

## 2022-07-11 NOTE — Progress Notes (Signed)
PHARMACIST - PHYSICIAN COMMUNICATION  CONCERNING:  Enoxaparin (Lovenox) for DVT Prophylaxis    RECOMMENDATION: Patient was prescribed enoxaprin 71m q24 hours for VTE prophylaxis.   Filed Weights   07/10/22 2328  Weight: 104 kg (229 lb 4.5 oz)    Body mass index is 33.86 kg/m.  CrCl cannot be calculated (Unknown ideal weight.).   Based on CGambierpatient is candidate for enoxaparin 0.528mkg TBW SQ every 24 hours based on BMI being >30.  DESCRIPTION: [facility]  Patient is now receiving enoxaparin 0.5 mg/kg every 24 hours   NaRenda RollsPharmD, MBClinica Espanola Inc/04/2023 2:50 AM

## 2022-07-11 NOTE — Assessment & Plan Note (Signed)
-   We will continue his psychotropic medications. - Psychiatry consult will be obtained.

## 2022-07-11 NOTE — ED Notes (Signed)
Patient transported to CT 

## 2022-07-11 NOTE — Progress Notes (Signed)
Progress Note   Patient: Lawrence Morgan [id number] DOB: Oct 13, 1980 DOA: 07/10/2022     0 DOS: the patient was seen and examined on 07/11/2022   Brief hospital course: Taken from H&P.  Lawrence Morgan is a 42 y.o. male with medical history significant for schizoaffective disorder bipolar type, PTSD, marijuana abuse and intellectual disability, who presented to the emergency room from his group home with his assistant with acute onset of abdominal pain after ingesting 4-9V batteries 3 days ago.  He he apparently also swallowed 3 screws.  Patient also endorsed suicidal thoughts.  ED Course: When he came to the ER vital signs were within normal.  Labs revealed normal CMP and CBC showed no leukocytosis of 11.9.  Tylenol level was less than 10 and salicylate less than 7.  His influenza COVID-19 and RSV PCR came back negative   EKG as reviewed by me :  EKG showed normal sinus rhythm with a rate of 85 Imaging: 1 abdomen x-ray revealed the following: 1. Total of 3 screws overlying the right lower abdomen are noted (measuring approximately 4 cm). 2. Total of 3 rectangular batteries (measuring approximately 6.5 x 3.5 cm) overlying the mid abdomen. 3. Nonobstructive bowel gas pattern. 4. No findings to suggest pneumoperitoneum/bowel perforation with markedly limited evaluation on this supine radiograph. Recommend CT abdomen/pelvis with intravenous contrast for further evaluation.  Abdominal and pelvic CT scan without contrast revealed the following: 1. Three 5 x 2.5 cm rectangular batteries within the gastric lumen. 2. Three 3 cm screws within the cecal lumen. 3. No bowel perforation. 4. Bilateral trace pleural effusions.   Dr. Marius Ditch was contacted about the patient and will plan for EGD in a.m.   Psych was also consulted and patient will be transferred to behavioral health after medical clearance.  2/11: Vitals and labs stable.  Leukocytosis resolved.  EGD did show 3 9V batteries in the stomach,  unsuccessful attempt to remove them.  Please see the report.  General surgery was consulted for surgical removal.  Also found to have a small gastric ulcer with clean base.    Assessment and Plan: * Foreign body ingestion, initial encounter - This is considered a suicidal attempt swallowing 9Vl batteries and screws. Unsuccessful attempt to remove batteries which were found in stomach on EGD. -Will be going for surgical intervention. -Patient did admit to suicidal attempt-will be going to behavioral health after medical clearance  Dyslipidemia - We will continue statin therapy.  Hypothyroidism - We will continue Synthroid.  Schizoaffective disorder, bipolar type ([place]) - We will continue his psychotropic medications. - Psychiatry consult will be obtained.  GERD without esophagitis - We will continue his PPI therapy.  Suicide attempt [facility]) Was evaluated by psychiatry.  Did admit about suicidal thought and attempt. -Going to behavioral health after medical clearance  Depression-resolved as of 07/11/2022 - We will continue his Zoloft and trazodone as well as his clozapine with Cogentin..   Subjective: Patient was seen and examined before EGD today.  Continued to complain about belly pain.  No nausea or vomiting.  Physical Exam: Vitals:   07/10/22 2329 07/11/22 0333 07/11/22 0928 07/11/22 1258  BP: 118/62 116/71 103/62 124/79  Pulse: 83 86 82 94  Resp: 18 18 18 13  $ Temp:  98.7 F (37.1 C) 98.9 F (37.2 C) (!) 96.9 F (36.1 C)  TempSrc:  Oral Oral Temporal  SpO2: 98% 97% 97% 100%  Weight:    104 kg  Height:    5' 10"$  (1.778 m)  General.     In no acute distress. Pulmonary.  Lungs clear bilaterally, normal respiratory effort. CV.  Regular rate and rhythm, no JVD, rub or murmur. Abdomen.  Soft, nontender, nondistended, BS positive. CNS.  Alert and oriented .  No focal neurologic deficit. Extremities.  No edema, no cyanosis, pulses intact and symmetrical. Psychiatry.   Judgment and insight appears normal.   Data Reviewed: Prior data reviewed  Family Communication: Discussed with patient  Disposition: Status is: Observation The patient will require care spanning > 2 midnights and should be moved to inpatient because: Severity of illness  Planned Discharge Destination:  Behavioral health  Time spent:  minutes  This record has been created using Systems analyst. Errors have been sought and corrected,but may not always be located. Such creation errors do not reflect on the standard of care.   Author: Lorella Nimrod, MD 07/11/2022 3:46 PM  For on call review [email or web address].

## 2022-07-11 NOTE — Anesthesia Preprocedure Evaluation (Signed)
Anesthesia Evaluation  Patient identified by MRN, date of birth, ID band Patient awake    Reviewed: Allergy & Precautions, NPO status , Patient's Chart, lab work & pertinent test results  History of Anesthesia Complications Negative for: history of anesthetic complications  Airway Mallampati: III  TM Distance: >3 FB Neck ROM: full    Dental no notable dental hx.    Pulmonary asthma , Current Smoker   Pulmonary exam normal        Cardiovascular negative cardio ROS Normal cardiovascular exam     Neuro/Psych  PSYCHIATRIC DISORDERS Anxiety Depression Bipolar Disorder Schizophrenia  negative neurological ROS     GI/Hepatic ,GERD  Medicated,,(+)     substance abuse  marijuana use  Endo/Other  Hypothyroidism    Renal/GU negative Renal ROS  negative genitourinary   Musculoskeletal   Abdominal   Peds  Hematology negative hematology ROS (+)   Anesthesia Other Findings Past Medical History: No date: Intellectual disability     Comment:  mild No date: Marijuana use No date: Personality disorder ([place]) No date: PTSD (post-traumatic stress disorder) 04/29/2018: Schizo-affective schizophrenia ([place]) No date: Schizoaffective disorder, bipolar type ([place])  Past Surgical History: 04/30/2018: ESOPHAGOGASTRODUODENOSCOPY (EGD) WITH PROPOFOL; N/A     Comment:  Procedure: ESOPHAGOGASTRODUODENOSCOPY (EGD) WITH               PROPOFOL;  Surgeon: Otis Brace, MD;  Location: [facility]               ENDOSCOPY;  Service: Gastroenterology;  Laterality: N/A; 01/20/2021: ESOPHAGOGASTRODUODENOSCOPY (EGD) WITH PROPOFOL; N/A     Comment:  Procedure: ESOPHAGOGASTRODUODENOSCOPY (EGD) WITH               PROPOFOL;  Surgeon: Lucilla Lame, MD;  Location: [facility]               ENDOSCOPY;  Service: Endoscopy;  Laterality: N/A; 04/30/2018: FOREIGN BODY REMOVAL; N/A     Comment:  Procedure: FOREIGN BODY REMOVAL;  Surgeon: Otis Brace,  MD;  Location: [place];  Service:               Gastroenterology;  Laterality: N/A; No date: FRACTURE SURGERY     Comment:   r ankle   BMI    Body Mass Index: 32.90 kg/m      Reproductive/Obstetrics negative OB ROS                             Anesthesia Physical Anesthesia Plan  ASA: 2  Anesthesia Plan: General   Post-op Pain Management:    Induction: Intravenous  PONV Risk Score and Plan: 2 and Dexamethasone, Ondansetron and Treatment may vary due to age or medical condition  Airway Management Planned: Oral ETT  Additional Equipment:   Intra-op Plan:   Post-operative Plan: Extubation in OR  Informed Consent: I have reviewed the patients History and Physical, chart, labs and discussed the procedure including the risks, benefits and alternatives for the proposed anesthesia with the patient or authorized representative who has indicated his/her understanding and acceptance.     Dental Advisory Given  Plan Discussed with: Anesthesiologist, CRNA and Surgeon  Anesthesia Plan Comments: (Patient consented for risks of anesthesia including but not limited to:  - adverse reactions to medications - risk of airway placement if required - damage to eyes, teeth, lips or other oral mucosa - nerve damage due  to positioning  - sore throat or hoarseness - Damage to heart, brain, nerves, lungs, other parts of body or loss of life  Patient voiced understanding.)        Anesthesia Quick Evaluation

## 2022-07-11 NOTE — Assessment & Plan Note (Signed)
-   We will continue statin therapy. 

## 2022-07-11 NOTE — Anesthesia Postprocedure Evaluation (Signed)
Anesthesia Post Note  Patient: Lawrence Morgan  Procedure(s) Performed: ESOPHAGOGASTRODUODENOSCOPY (EGD)  Patient location: transported to OR 7. Anesthesia Type: General Level of consciousness: patient remains intubated per anesthesia plan Pain management: pain level controlled Vital Signs Assessment: post-procedure vital signs reviewed and stable Respiratory status: patient remains intubated per anesthesia plan Cardiovascular status: stable Anesthetic complications: no   No notable events documented.   Last Vitals:  Vitals:   07/11/22 0928 07/11/22 1258  BP: 103/62 124/79  Pulse: 82 94  Resp: 18 13  Temp: 37.2 C (!) 36.1 C  SpO2: 97% 100%    Last Pain:  Vitals:   07/11/22 1258  TempSrc: Temporal  PainSc: 0-No pain                 Ilene Qua

## 2022-07-11 NOTE — ED Notes (Signed)
Dr. Reesa Chew, attending MD at bedside at this time.

## 2022-07-11 NOTE — ED Notes (Signed)
Psychiatric team at bedside at this time. Including psych NP and TTS.

## 2022-07-11 NOTE — Transfer of Care (Signed)
Immediate Anesthesia Transfer of Care Note  Patient: Orion Klopf  Procedure(s) Performed: EXPLORATORY LAPAROTOMY (Abdomen)  Patient Location: PACU  Anesthesia Type:General  Level of Consciousness: drowsy  Airway & Oxygen Therapy: Patient Spontanous Breathing and Patient connected to face mask oxygen  Post-op Assessment: Report given to RN and Post -op Vital signs reviewed and stable  Post vital signs: Reviewed and stable  Last Vitals:  Vitals Value Taken Time  BP 125/78 07/11/22 1738  Temp    Pulse 84 07/11/22 1740  Resp 28 07/11/22 1740  SpO2 99 % 07/11/22 1740  Vitals shown include unvalidated device data.  Last Pain:  Vitals:   07/11/22 1258  TempSrc: Temporal  PainSc: 0-No pain         Complications: No notable events documented.

## 2022-07-11 NOTE — BH Assessment (Signed)
Patient minimally participated in assessment.  Patient will be admitted to medical floor.  He admitted to being suicidal but refused to answer any additional questions.  Mostly due to being lethargic.   Anticipate patient being admitted to psychiatric unit when medically cleared.   Per chart review, Pt arrives with group home assistant, pt c/o abdominal pain. States he is tired of constant pain, says he swallowed 4 9-volt batteries a 3 days ago, thinks he has passed 2 of them. Pt in no acute distress presently, does endorse SI and AH/VH. Legal guardian: Audry Pili [phone])   Contact for group home staff below:  Assistant: Merlene Morse [phone]) Owner of [employer]: Edd Arbour [phone])

## 2022-07-11 NOTE — Assessment & Plan Note (Signed)
-   We will continue his Zoloft and trazodone as well as his clozapine with Cogentin.Marland Kitchen

## 2022-07-11 NOTE — Assessment & Plan Note (Signed)
-   We will continue his PPI therapy. 

## 2022-07-11 NOTE — Consult Note (Signed)
[facility] Consult   Reason for Consult:  does not like his group home, swallowed batteries and screws Referring Physician:  EDP Patient Identification: Ezrah Karney MRN:  [id number] Principal Diagnosis: schizoaffective disorder, bipolar type Diagnosis:  Active Problems:   Schizoaffective disorder, bipolar type ([place])   Foreign body ingestion, initial encounter   Suicide attempt ([place])   Hypothyroidism   Dyslipidemia   GERD without esophagitis   Total Time spent with patient: 45 minutes  Subjective:   Rajveer Hanlin is a 42 y.o. male patient admitted with ingestion of batteries and screws.  HPI:  42 yo male presented from his group home after swallowing batteries and screws, history of ingestion batteries and screws.  He does not like his group home and upset that his guardian did not agree to move group homes.  Similar incident last year on 2/9, both incidents around his birthday which may be related.  No suicidal/homicidal ideations or psychosis or substance abuse.  Discussed the need to work with his guardian on relocating as the hospital does not provide these services as he requested to go "downstairs" and get a new group home.  Psych cleared to follow up outpatient.  Caveat:  client has IDD and difficult to rationalize with at times.  Past Psychiatric History: schizoaffective disorder, bipolar type  Risk to Self:  none Risk to Others:  none Prior Inpatient Therapy:  yes Prior Outpatient Therapy:  in place per group home  Past Medical History:  Past Medical History:  Diagnosis Date   Intellectual disability    mild   Marijuana use    Personality disorder ([place])    PTSD (post-traumatic stress disorder)    Schizo-affective schizophrenia ([place]) 04/29/2018   Schizoaffective disorder, bipolar type ([place])     Past Surgical History:  Procedure Laterality Date   ESOPHAGOGASTRODUODENOSCOPY (EGD) WITH PROPOFOL N/A 04/30/2018   Procedure: ESOPHAGOGASTRODUODENOSCOPY  (EGD) WITH PROPOFOL;  Surgeon: Otis Brace, MD;  Location: [facility];  Service: Gastroenterology;  Laterality: N/A;   ESOPHAGOGASTRODUODENOSCOPY (EGD) WITH PROPOFOL N/A 01/20/2021   Procedure: ESOPHAGOGASTRODUODENOSCOPY (EGD) WITH PROPOFOL;  Surgeon: Lucilla Lame, MD;  Location[facility] ENDOSCOPY;  Service: Endoscopy;  Laterality: N/A;   FOREIGN BODY REMOVAL N/A 04/30/2018   Procedure: FOREIGN BODY REMOVAL;  Surgeon: Otis Brace, MD;  Location: [facility];  Service: Gastroenterology;  Laterality: N/A;   FRACTURE SURGERY      r ankle    Family History: History reviewed. No pertinent family history. Family Psychiatric  History: unknown  Social History:  Social History   Substance and Sexual Activity  Alcohol Use Never     Social History   Substance and Sexual Activity  Drug Use Never   Frequency: 3.0 times per week   Types: Marijuana    Social History   Socioeconomic History   Marital status: Single    Spouse name: Not on file   Number of children: Not on file   Years of education: Not on file   Highest education level: Not on file  Occupational History   Not on file  Tobacco Use   Smoking status: Every Day    Packs/day: 1.50    Types: Cigarettes, Cigars   Smokeless tobacco: Never  Vaping Use   Vaping Use: Some days   Substances: Nicotine  Substance and Sexual Activity   Alcohol use: Never   Drug use: Never    Frequency: 3.0 times per week    Types: Marijuana   Sexual activity: Not Currently  Other Topics Concern  Not on file  Social History Narrative   ** Merged History Encounter **       Social Determinants of Health   Financial Resource Strain: Not on file  Food Insecurity: Not on file  Transportation Needs: Not on file  Physical Activity: Not on file  Stress: Not on file  Social Connections: Not on file   Additional Social History:    Allergies:   Allergies  Allergen Reactions   Haldol [Haloperidol Lactate]    Haldol [Haloperidol  Lactate] Other (See Comments)    Unknown   Shellfish Allergy Itching and Rash    Labs:  Results for orders placed or performed during the hospital encounter of 07/10/22 (from the past 48 hour(s))  Comprehensive metabolic panel     Status: None   Collection Time: 07/10/22 11:34 PM  Result Value Ref Range   Sodium 138 135 - 145 mmol/L   Potassium 3.7 3.5 - 5.1 mmol/L   Chloride 107 98 - 111 mmol/L   CO2 24 22 - 32 mmol/L   Glucose, Bld 93 70 - 99 mg/dL    Comment: Glucose reference range applies only to samples taken after fasting for at least 8 hours.   BUN 11 6 - 20 mg/dL   Creatinine, Ser 1.01 0.61 - 1.24 mg/dL   Calcium 9.5 8.9 - 10.3 mg/dL   Total Protein 7.6 6.5 - 8.1 g/dL   Albumin 4.6 3.5 - 5.0 g/dL   AST 24 15 - 41 U/L   ALT 24 0 - 44 U/L   Alkaline Phosphatase 78 38 - 126 U/L   Total Bilirubin 0.6 0.3 - 1.2 mg/dL   GFR, Estimated >60 >60 mL/min    Comment: (NOTE) Calculated using the CKD-EPI Creatinine Equation (2021)    Anion gap 7 5 - 15    Comment: Performed at [facility], [address] [place], [place] [address]  Ethanol     Status: None   Collection Time: 07/10/22 11:34 PM  Result Value Ref Range   Alcohol, Ethyl (B) <10 <10 mg/dL    Comment: (NOTE) Lowest detectable limit for serum alcohol is 10 mg/dL.  For medical purposes only. Performed at [facility], [address] [place], [place] [id number]   Salicylate level     Status: Abnormal   Collection Time: 07/10/22 11:34 PM  Result Value Ref Range   Salicylate Lvl Q000111Q (L) 7.0 - 30.0 mg/dL    Comment: Performed at [facility], [address] [place], [place] [address]  Acetaminophen level     Status: Abnormal   Collection Time: 07/10/22 11:34 PM  Result Value Ref Range   Acetaminophen (Tylenol), Serum <10 (L) 10 - 30 ug/mL    Comment: (NOTE) Therapeutic concentrations vary significantly. A range of 10-30 ug/mL  may be an effective concentration for many patients.  However, some  are best treated at concentrations outside of this range. Acetaminophen concentrations >150 ug/mL at 4 hours after ingestion  and >50 ug/mL at 12 hours after ingestion are often associated with  toxic reactions.  Performed at [facility], [address] [place], [place] [address]   cbc     Status: Abnormal   Collection Time: 07/10/22 11:34 PM  Result Value Ref Range   WBC 11.9 (H) 4.0 - 10.5 K/uL   RBC 4.90 4.22 - 5.81 MIL/uL   Hemoglobin 13.0 13.0 - 17.0 g/dL   HCT 41.3 39.0 - 52.0 %   MCV 84.3 80.0 - 100.0  fL   MCH 26.5 26.0 - 34.0 pg   MCHC 31.5 30.0 - 36.0 g/dL   RDW 13.2 11.5 - 15.5 %   Platelets 235 150 - 400 K/uL   nRBC 0.0 0.0 - 0.2 %    Comment: Performed at [facility], [address] [place], [place] [address]  Resp panel by RT-PCR (RSV, Flu A&B, Covid) Anterior Nasal Swab     Status: None   Collection Time: 07/10/22 11:44 PM   Specimen: Anterior Nasal Swab  Result Value Ref Range   SARS Coronavirus 2 by RT PCR NEGATIVE NEGATIVE    Comment: (NOTE) SARS-CoV-2 target nucleic acids are NOT DETECTED.  The SARS-CoV-2 RNA is generally detectable in upper respiratory specimens during the acute phase of infection. The lowest concentration of SARS-CoV-2 viral copies this assay can detect is 138 copies/mL. A negative result does not preclude SARS-Cov-2 infection and should not be used as the sole basis for treatment or other patient management decisions. A negative result may occur with  improper specimen collection/handling, submission of specimen other than nasopharyngeal swab, presence of viral mutation(s) within the areas targeted by this assay, and inadequate number of viral copies(<138 copies/mL). A negative result must be combined with clinical observations, patient history, and epidemiological information. The expected result is Negative.  Fact Sheet for Patients:  EntrepreneurPulse.com.au  Fact Sheet for  Healthcare Providers:  IncredibleEmployment.be  This test is no t yet approved or cleared by the [place] FDA and  has been authorized for detection and/or diagnosis of SARS-CoV-2 by FDA under an Emergency Use Authorization (EUA). This EUA will remain  in effect (meaning this test can be used) for the duration of the COVID-19 declaration under Section 564(b)(1) of the Act, 21 U.S.C.section 360bbb-3(b)(1), unless the authorization is terminated  or revoked sooner.       Influenza A by PCR NEGATIVE NEGATIVE   Influenza B by PCR NEGATIVE NEGATIVE    Comment: (NOTE) The Xpert Xpress SARS-CoV-2/FLU/RSV plus assay is intended as an aid in the diagnosis of influenza from Nasopharyngeal swab specimens and should not be used as a sole basis for treatment. Nasal washings and aspirates are unacceptable for Xpert Xpress SARS-CoV-2/FLU/RSV testing.  Fact Sheet for Patients: EntrepreneurPulse.com.au  Fact Sheet for Healthcare Providers: IncredibleEmployment.be  This test is not yet approved or cleared by the [place] FDA and has been authorized for detection and/or diagnosis of SARS-CoV-2 by FDA under an Emergency Use Authorization (EUA). This EUA will remain in effect (meaning this test can be used) for the duration of the COVID-19 declaration under Section 564(b)(1) of the Act, 21 U.S.C. section 360bbb-3(b)(1), unless the authorization is terminated or revoked.     Resp Syncytial Virus by PCR NEGATIVE NEGATIVE    Comment: (NOTE) Fact Sheet for Patients: EntrepreneurPulse.com.au  Fact Sheet for Healthcare Providers: IncredibleEmployment.be  This test is not yet approved or cleared by the [place] FDA and has been authorized for detection and/or diagnosis of SARS-CoV-2 by FDA under an Emergency Use Authorization (EUA). This EUA will remain in effect (meaning this test can be used)  for the duration of the COVID-19 declaration under Section 564(b)(1) of the Act, 21 U.S.C. section 360bbb-3(b)(1), unless the authorization is terminated or revoked.  Performed at [facility], [address], [place], [place] [id number]   Basic metabolic panel     Status: Abnormal   Collection Time: 07/11/22  4:36 AM  Result Value Ref Range   Sodium 137 135 -  145 mmol/L   Potassium 3.8 3.5 - 5.1 mmol/L   Chloride 108 98 - 111 mmol/L   CO2 25 22 - 32 mmol/L   Glucose, Bld 113 (H) 70 - 99 mg/dL    Comment: Glucose reference range applies only to samples taken after fasting for at least 8 hours.   BUN 10 6 - 20 mg/dL   Creatinine, Ser 0.90 0.61 - 1.24 mg/dL   Calcium 8.9 8.9 - 10.3 mg/dL   GFR, Estimated >60 >60 mL/min    Comment: (NOTE) Calculated using the CKD-EPI Creatinine Equation (2021)    Anion gap 4 (L) 5 - 15    Comment: Performed at [facility], [address] [place], [place] [address]  CBC     Status: Abnormal   Collection Time: 07/11/22  4:36 AM  Result Value Ref Range   WBC 8.9 4.0 - 10.5 K/uL   RBC 4.37 4.22 - 5.81 MIL/uL   Hemoglobin 11.7 (L) 13.0 - 17.0 g/dL   HCT 37.9 (L) 39.0 - 52.0 %   MCV 86.7 80.0 - 100.0 fL   MCH 26.8 26.0 - 34.0 pg   MCHC 30.9 30.0 - 36.0 g/dL   RDW 13.2 11.5 - 15.5 %   Platelets 210 150 - 400 K/uL   nRBC 0.0 0.0 - 0.2 %    Comment: Performed at [facility], [address] [place], [place] [address]  Urine Drug Screen, Qualitative     Status: None   Collection Time: 07/11/22  7:57 AM  Result Value Ref Range   Tricyclic, Ur Screen NONE DETECTED NONE DETECTED   Amphetamines, Ur Screen NONE DETECTED NONE DETECTED   MDMA (Ecstasy)Ur Screen NONE DETECTED NONE DETECTED   Cocaine Metabolite,Ur [place] NONE DETECTED NONE DETECTED   Opiate, Ur Screen NONE DETECTED NONE DETECTED   Phencyclidine (PCP) Ur S NONE DETECTED NONE DETECTED   Cannabinoid 50 Ng, Ur [place] NONE DETECTED NONE DETECTED   Barbiturates, Ur  Screen NONE DETECTED NONE DETECTED   Benzodiazepine, Ur Scrn NONE DETECTED NONE DETECTED   Methadone Scn, Ur NONE DETECTED NONE DETECTED    Comment: (NOTE) Tricyclics + metabolites, urine    Cutoff 1000 ng/mL Amphetamines + metabolites, urine  Cutoff 1000 ng/mL MDMA (Ecstasy), urine              Cutoff 500 ng/mL Cocaine Metabolite, urine          Cutoff 300 ng/mL Opiate + metabolites, urine        Cutoff 300 ng/mL Phencyclidine (PCP), urine         Cutoff 25 ng/mL Cannabinoid, urine                 Cutoff 50 ng/mL Barbiturates + metabolites, urine  Cutoff 200 ng/mL Benzodiazepine, urine              Cutoff 200 ng/mL Methadone, urine                   Cutoff 300 ng/mL  The urine drug screen provides only a preliminary, unconfirmed analytical test result and should not be used for non-medical purposes. Clinical consideration and professional judgment should be applied to any positive drug screen result due to possible interfering substances. A more specific alternate chemical method must be used in order to obtain a confirmed analytical result. Gas chromatography / mass spectrometry (GC/MS) is the preferred confirm atory method. Performed at [facility], [address] [place], [place] [address]  Current Facility-Administered Medications  Medication Dose Route Frequency Provider Last Rate Last Admin   0.9 %  sodium chloride infusion   Intravenous Continuous Mansy, Jan A, MD 100 mL/hr at 07/11/22 0335 New Bag at 07/11/22 0335   acetaminophen (TYLENOL) tablet 650 mg  650 mg Oral Q6H PRN Mansy, Jan A, MD       Or   acetaminophen (TYLENOL) suppository 650 mg  650 mg Rectal Q6H PRN Mansy, Jan A, MD       albuterol (PROVENTIL) (2.5 MG/3ML) 0.083% nebulizer solution 2.5 mg  2.5 mg Nebulization Q6H PRN Renda Rolls, RPH       benztropine (COGENTIN) tablet 0.5 mg  0.5 mg Oral BID Mansy, Jan A, MD       cholecalciferol (VITAMIN D3) 25 MCG (1000 UNIT) tablet 2,000 Units   2,000 Units Oral Daily Mansy, Jan A, MD       docusate sodium (COLACE) capsule 200 mg  200 mg Oral BID Mansy, Jan A, MD       enoxaparin (LOVENOX) injection 52.5 mg  0.5 mg/kg Subcutaneous Q24H Mansy, Jan A, MD       ferrous sulfate tablet 325 mg  325 mg Oral Q1200 Mansy, Jan A, MD       hydrOXYzine (ATARAX) tablet 50 mg  50 mg Oral Q6H PRN Mansy, Jan A, MD       lithium carbonate (ESKALITH) ER tablet 900 mg  900 mg Oral QHS Renda Rolls, RPH       lithium carbonate capsule 300 mg  300 mg Oral BID Renda Rolls, RPH       morphine (PF) 2 MG/ML injection 2 mg  2 mg Intravenous Q4H PRN Mansy, Jan A, MD       OLANZapine zydis (ZYPREXA) disintegrating tablet 20 mg  20 mg Oral QHS Mansy, Jan A, MD       ondansetron [facility]) tablet 4 mg  4 mg Oral Q6H PRN Mansy, Jan A, MD       Or   ondansetron [facility]) injection 4 mg  4 mg Intravenous Q6H PRN Mansy, Jan A, MD       oxybutynin (DITROPAN-XL) 24 hr tablet 10 mg  10 mg Oral QHS Mansy, Jan A, MD       pantoprazole (PROTONIX) EC tablet 40 mg  40 mg Oral Daily Mansy, Jan A, MD       polyethylene glycol (MIRALAX / GLYCOLAX) packet 17 g  17 g Oral Daily Mansy, Jan A, MD       sertraline (ZOLOFT) tablet 100 mg  100 mg Oral Daily Mansy, Jan A, MD       traZODone (DESYREL) tablet 100 mg  100 mg Oral QHS Mansy, Jan A, MD       traZODone (DESYREL) tablet 25 mg  25 mg Oral QHS PRN Mansy, Jan A, MD       ziprasidone (GEODON) injection 10 mg  10 mg Intramuscular Q6H PRN Mansy, Arvella Merles, MD       Current Outpatient Medications  Medication Sig Dispense Refill   benztropine (COGENTIN) 0.5 MG tablet Take 1 tablet (0.5 mg total) by mouth 2 (two) times daily. 60 tablet 1   Cholecalciferol (VITAMIN D3) 50 MCG (2000 UT) capsule Take 2,000 Units by mouth daily.     cloZAPine (CLOZARIL) 100 MG tablet Take 2.5 tablets (250 mg total) by mouth at bedtime. 90 tablet 0   levothyroxine (SYNTHROID) 25 MCG tablet Take 25 mcg by mouth daily.  lithium 300 MG tablet Take 300 mg  by mouth 2 (two) times daily.     lithium carbonate (ESKALITH) 450 MG CR tablet Take 2 tablets (900 mg total) by mouth at bedtime. 60 tablet 1   OLANZapine zydis (ZYPREXA) 20 MG disintegrating tablet Take 1 tablet (20 mg total) by mouth at bedtime. 30 tablet 1   oxybutynin (DITROPAN-XL) 10 MG 24 hr tablet Take 1 tablet (10 mg total) by mouth at bedtime. 30 tablet 1   paliperidone (INVEGA SUSTENNA) 234 MG/1.5ML SUSY injection Inject 234 mg into the muscle every 28 (twenty-eight) days. 1.8 mL 1   pantoprazole (PROTONIX) 40 MG tablet Take 1 tablet (40 mg total) by mouth daily. 30 tablet 1   rosuvastatin (CRESTOR) 20 MG tablet Take 20 mg by mouth at bedtime.     sertraline (ZOLOFT) 100 MG tablet Take 1 tablet (100 mg total) by mouth daily. 30 tablet 1   traZODone (DESYREL) 100 MG tablet Take 1 tablet (100 mg total) by mouth at bedtime. 30 tablet 1   albuterol (PROVENTIL HFA;VENTOLIN HFA) 108 (90 Base) MCG/ACT inhaler Inhale 2 puffs into the lungs every 6 (six) hours as needed for wheezing or shortness of breath. (Patient not taking: Reported on 07/20/2021)     cetirizine (ZYRTEC) 10 MG tablet Take 10 mg by mouth daily. (Patient not taking: Reported on 07/20/2021)      Musculoskeletal: Strength & Muscle Tone: within normal limits Gait & Station: normal Patient leans: N/A  Psychiatric Specialty Exam: Physical Exam Vitals and nursing note reviewed.  Constitutional:      Appearance: He is well-developed.  HENT:     Head: Normocephalic.  Neurological:     General: No focal deficit present.     Mental Status: He is alert and oriented to person, place, and time.  Psychiatric:        Attention and Perception: Attention and perception normal.        Mood and Affect: Mood is anxious and depressed.        Speech: Speech normal.        Behavior: Behavior normal.        Thought Content: Thought content normal.        Cognition and Memory: Cognition is impaired.        Judgment: Judgment is impulsive.      Review of Systems  Psychiatric/Behavioral:  Positive for depression. The patient is nervous/anxious.   All other systems reviewed and are negative.   Blood pressure 103/62, pulse 82, temperature 98.9 F (37.2 C), temperature source Oral, resp. rate 18, weight 104 kg, SpO2 97 %.Body mass index is 33.86 kg/m.  General Appearance: Casual  Eye Contact:  Good  Speech:  Normal Rate  Volume:  Normal  Mood:  Anxious and Depressed  Affect:  Congruent  Thought Process:  Coherent and Descriptions of Associations: Intact  Orientation:  Full (Time, Place, and Person)  Thought Content:  WDL and Logical  Suicidal Thoughts:  No  Homicidal Thoughts:  No  Memory:  Immediate;   Fair Recent;   Fair Remote;   Fair  Judgement:  Impaired  Insight:  Lacking  Psychomotor Activity:  Normal  Concentration:  Concentration: Fair and Attention Span: Fair  Recall:  [employer] of Knowledge:  Fair  Language:  Good  Akathisia:  No  Handed:  Right  AIMS (if indicated):     Assets:  Housing Leisure Time Physical Health Resilience  ADL's:  Intact  Cognition:  Impaired,  Mild  Sleep:        Physical Exam: Physical Exam Vitals and nursing note reviewed.  Constitutional:      Appearance: He is well-developed.  HENT:     Head: Normocephalic.  Neurological:     General: No focal deficit present.     Mental Status: He is alert and oriented to person, place, and time.  Psychiatric:        Attention and Perception: Attention and perception normal.        Mood and Affect: Mood is anxious and depressed.        Speech: Speech normal.        Behavior: Behavior normal.        Thought Content: Thought content normal.        Cognition and Memory: Cognition is impaired.        Judgment: Judgment is impulsive.    Review of Systems  Psychiatric/Behavioral:  Positive for depression. The patient is nervous/anxious.   All other systems reviewed and are negative.  Blood pressure 103/62, pulse 82,  temperature 98.9 F (37.2 C), temperature source Oral, resp. rate 18, weight 104 kg, SpO2 97 %. Body mass index is 33.86 kg/m.  Treatment Plan Summary: Schizoaffective disorder, bipolar type: Lithium 300 mg BID and 900 mg at bedtime, lithium level ordered Zyprexa 20 mg daily at bedtime Zoloft 100 mg daily  Anxiety: Hydroxyzine 50 mg every six hours PRN  EPS: Cogentin 0.5 mg BID  Insomnia: Trazodone 100 mg daily at bedtime and 25 mg PRN   Disposition: No evidence of imminent risk to self or others at present.   Patient does not meet criteria for psychiatric inpatient admission. Supportive therapy provided about ongoing stressors.  Waylan Boga, NP 07/11/2022 10:48 AM

## 2022-07-11 NOTE — Transfer of Care (Signed)
Immediate Anesthesia Transfer of Care Note  Patient: Lawrence Morgan  Procedure(s) Performed: ESOPHAGOGASTRODUODENOSCOPY (EGD)  Patient Location:  OR 7  Anesthesia Type:General  Level of Consciousness: Patient remains intubated per anesthesia plan  Airway & Oxygen Therapy: Patient remains intubated per anesthesia plan and Patient placed on Ventilator (see vital sign flow sheet for setting)  Post-op Assessment: Post -op Vital signs reviewed and stable  Post vital signs: Reviewed and stable  Last Vitals:  Vitals Value Taken Time  BP    Temp    Pulse    Resp    SpO2      Last Pain:  Vitals:   07/11/22 1258  TempSrc: Temporal  PainSc: 0-No pain         Complications: No notable events documented.

## 2022-07-11 NOTE — Assessment & Plan Note (Signed)
Was evaluated by psychiatry.  Did admit about suicidal thought and attempt. -Going to behavioral health after medical clearance

## 2022-07-11 NOTE — Hospital Course (Addendum)
Taken from H&P.  Lawrence Morgan is a 42 y.o. male with medical history significant for schizoaffective disorder bipolar type, PTSD, marijuana abuse and intellectual disability, who presented to the emergency room from his group home with his assistant with acute onset of abdominal pain after ingesting 4-9V batteries 3 days ago.  He he apparently also swallowed 3 screws.  Patient also endorsed suicidal thoughts.  ED Course: When he came to the ER vital signs were within normal.  Labs revealed normal CMP and CBC showed no leukocytosis of 11.9.  Tylenol level was less than 10 and salicylate less than 7.  His influenza COVID-19 and RSV PCR came back negative   EKG as reviewed by me :  EKG showed normal sinus rhythm with a rate of 85 Imaging: 1 abdomen x-ray revealed the following: 1. Total of 3 screws overlying the right lower abdomen are noted (measuring approximately 4 cm). 2. Total of 3 rectangular batteries (measuring approximately 6.5 x 3.5 cm) overlying the mid abdomen. 3. Nonobstructive bowel gas pattern. 4. No findings to suggest pneumoperitoneum/bowel perforation with markedly limited evaluation on this supine radiograph. Recommend CT abdomen/pelvis with intravenous contrast for further evaluation.  Abdominal and pelvic CT scan without contrast revealed the following: 1. Three 5 x 2.5 cm rectangular batteries within the gastric lumen. 2. Three 3 cm screws within the cecal lumen. 3. No bowel perforation. 4. Bilateral trace pleural effusions.   Dr. Marius Ditch was contacted about the patient and will plan for EGD in a.m.   Psych was also consulted and patient will be transferred to behavioral health after medical clearance.  2/11: Vitals and labs stable.  Leukocytosis resolved.  EGD did show 3 9V batteries in the stomach, unsuccessful attempt to remove them.  Please see the report.  General surgery was consulted for surgical removal.  Also found to have a small gastric ulcer with clean  base.  2/12: Mildly elevated blood pressure at 143/90.  He was emergently taken to the OR after unsuccessful EGD due to increased risk of perforation s/p gastrostomy with removal of 3 9 V batteries.  Out of proportion pain this morning so surgical team ordered upper GI series to rule out any leakage from gastrostomy lesion. 2 separate notes from psych, initial one saying that he will go to behavioral health due to suicidal intention after medical clearance and treatment, followed by a second note saying that he is not a threat and does not need inpatient psych.  This is his second incidence and he was very clear initially that he did swallowed those things with intention to harm himself. Patient has a very extensive psych history.  Message sent to Dr. Weber Cooks for clarification, according to his secure chat patient has an habit of swallowing different objects due to his underlying psych illness.  No concern of suicide. CBC with worsening leukocytosis, most likely reactive with surgery.  Hemoglobin stable.  2/13: Mildly elevated blood pressure at 153/84, upper GI series done yesterday was limited as patient was unable to tolerate contrast and difficulty in positioning but did not show any obvious gastric perforation along the fundus.  Today pain is much improved and tolerating soft diet.  Labs with improving leukocytosis.  Surgery cleared him for discharge. PT ordered and message sent to psych as he will be transferred to behavioral health.  2/14: Hemodynamically stable.  CT abdomen was repeated by general surgery today which was negative for any leak or perforation.  Did show 3 screws in the ascending colon.  Patient did not had any bowel movement yet and refusing to take MiraLAX.  Per general surgery he is ready to be transferred to behavioral health.  Another message sent to Dr. Weber Cooks.

## 2022-07-12 ENCOUNTER — Inpatient Hospital Stay: Payer: Medicare Other

## 2022-07-12 ENCOUNTER — Encounter: Payer: Self-pay | Admitting: General Surgery

## 2022-07-12 DIAGNOSIS — E785 Hyperlipidemia, unspecified: Secondary | ICD-10-CM | POA: Diagnosis not present

## 2022-07-12 DIAGNOSIS — E039 Hypothyroidism, unspecified: Secondary | ICD-10-CM | POA: Diagnosis not present

## 2022-07-12 DIAGNOSIS — F32A Depression, unspecified: Secondary | ICD-10-CM

## 2022-07-12 DIAGNOSIS — K219 Gastro-esophageal reflux disease without esophagitis: Secondary | ICD-10-CM

## 2022-07-12 DIAGNOSIS — T189XXA Foreign body of alimentary tract, part unspecified, initial encounter: Secondary | ICD-10-CM | POA: Diagnosis not present

## 2022-07-12 LAB — BASIC METABOLIC PANEL
Anion gap: 7 (ref 5–15)
BUN: 11 mg/dL (ref 6–20)
CO2: 22 mmol/L (ref 22–32)
Calcium: 8.9 mg/dL (ref 8.9–10.3)
Chloride: 109 mmol/L (ref 98–111)
Creatinine, Ser: 0.97 mg/dL (ref 0.61–1.24)
GFR, Estimated: >60 mL/min (ref >60)
Glucose, Bld: 107 mg/dL — ABNORMAL HIGH (ref 70–99)
Potassium: 3.8 mmol/L (ref 3.5–5.1)
Sodium: 138 mmol/L (ref 135–145)

## 2022-07-12 LAB — CBC WITH DIFFERENTIAL/PLATELET
Abs Immature Granulocytes: 0.08 10*3/uL — ABNORMAL HIGH (ref 0.00–0.07)
Basophils Absolute: 0 10*3/uL (ref 0.0–0.1)
Basophils Relative: 0 %
Eosinophils Absolute: 0 10*3/uL (ref 0.0–0.5)
Eosinophils Relative: 0 %
HCT: 38.2 % — ABNORMAL LOW (ref 39.0–52.0)
Hemoglobin: 12.1 g/dL — ABNORMAL LOW (ref 13.0–17.0)
Immature Granulocytes: 1 %
Lymphocytes Relative: 12 %
Lymphs Abs: 2.1 10*3/uL (ref 0.7–4.0)
MCH: 26.9 pg (ref 26.0–34.0)
MCHC: 31.7 g/dL (ref 30.0–36.0)
MCV: 85.1 fL (ref 80.0–100.0)
Monocytes Absolute: 1.3 10*3/uL — ABNORMAL HIGH (ref 0.1–1.0)
Monocytes Relative: 8 %
Neutro Abs: 14.2 10*3/uL — ABNORMAL HIGH (ref 1.7–7.7)
Neutrophils Relative %: 79 %
Platelets: 209 10*3/uL (ref 150–400)
RBC: 4.49 MIL/uL (ref 4.22–5.81)
RDW: 13.3 % (ref 11.5–15.5)
WBC: 17.7 10*3/uL — ABNORMAL HIGH (ref 4.0–10.5)
nRBC: 0 % (ref 0.0–0.2)

## 2022-07-12 LAB — CBC
HCT: 38.4 % — ABNORMAL LOW (ref 39.0–52.0)
Hemoglobin: 12 g/dL — ABNORMAL LOW (ref 13.0–17.0)
MCH: 26.7 pg (ref 26.0–34.0)
MCHC: 31.3 g/dL (ref 30.0–36.0)
MCV: 85.5 fL (ref 80.0–100.0)
Platelets: 213 10*3/uL (ref 150–400)
RBC: 4.49 MIL/uL (ref 4.22–5.81)
RDW: 13.3 % (ref 11.5–15.5)
WBC: 17.7 10*3/uL — ABNORMAL HIGH (ref 4.0–10.5)
nRBC: 0 % (ref 0.0–0.2)

## 2022-07-12 LAB — SURGICAL PATHOLOGY

## 2022-07-12 MED ORDER — CLOZAPINE 25 MG PO TABS
50.0000 mg | ORAL_TABLET | Freq: Every day | ORAL | Status: DC
  Administered 2022-07-12 – 2022-07-14 (×3): 50 mg via ORAL
  Filled 2022-07-12 (×4): qty 2

## 2022-07-12 MED ORDER — IOHEXOL 300 MG/ML  SOLN
25.0000 mL | Freq: Once | INTRAMUSCULAR | Status: AC | PRN
  Administered 2022-07-12: 25 mL via ORAL

## 2022-07-12 MED ORDER — ORAL CARE MOUTH RINSE: 15.0000 mL | OROMUCOSAL | Status: DC | PRN

## 2022-07-12 NOTE — Assessment & Plan Note (Signed)
Per psych this is not a suicidal attempt as patient has an habit of swallowing unusual objects.  S/p gastrotomy with removal of 3 9V batteries from his stomach by general surgery after a failed attempt via EGD.  Now tolerating soft diet, no bowel movements yet.  Upper GI series with difficult study but did not show any leak. CT abdomen today with no anastomosis leak or perforation, did show 3 screws in ascending colon. -Surgery cleared him for discharge. -Will be going to behavioral health -PT recommending home health -Encourage ambulation

## 2022-07-12 NOTE — Assessment & Plan Note (Signed)
-   We will continue his psychotropic medications. - Psychiatry consult , 2 different notes 1 stating that he will go to behavioral health and other stating no need for inpatient BH. -Psych to clarify intention and recommendations
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
## 2022-07-12 NOTE — Anesthesia Postprocedure Evaluation (Signed)
Anesthesia Post Note  Patient: Lawrence Morgan  Procedure(s) Performed: EXPLORATORY LAPAROTOMY (Abdomen)  Patient location during evaluation: PACU Anesthesia Type: General Post-procedure mental status: Patient asleep at present. NADnoted. Pain management: pain level controlled Vital Signs Assessment: post-procedure vital signs reviewed and stable Respiratory status: spontaneous breathing, nonlabored ventilation, respiratory function stable and patient connected to nasal cannula oxygen Cardiovascular status: blood pressure returned to baseline and stable Postop Assessment: no apparent nausea or vomiting Anesthetic complications: no  No notable events documented.   Last Vitals:  Vitals:   07/12/22 0600 07/12/22 0700  BP: (!) 151/86 (!) 143/90  Pulse: 96 100  Resp: (!) 35 20  Temp:  37.2 C  SpO2: 98% 98%    Last Pain:  Vitals:   07/12/22 0732  TempSrc:   PainSc: Asleep                 Alison Stalling

## 2022-07-12 NOTE — TOC Initial Note (Signed)
Transition of Care [facility]) - Initial/Assessment Note    Patient Details  Name: Lawrence Morgan MRN: [id number] Date of Birth: 1981/02/27  [facility] CM/SW Contact:    Shelbie Hutching, RN Phone Number: 07/12/2022, 3:12 PM  Clinical Narrative:                 Patient admitted to the hospital after swallowing several 9 V batteries, per patient's father he was upset about being at the group home and didn't want to be there anymore.  Patient has swallowed things in the past, batteries and nails.  S/P exlap where surgeon removed 3 9V batteries.   Patient lives at [facility] group home, per his father/legal guardian he has been here for a couple of years.  Patient can return at discharge, they will accept him back.  RNCM spoke with Group home owner, Chase [phone],  They can pick him up at discharge.   Patient is being evaluated by psychiatry here at the hospital, unsure if he will need inpatient psych treatment at this time, he may be able to go back to the group home per Dr. Weber Cooks and follow up outpatient.    Group home is unsure where the patient go the batteries, he had been on an outing with his father for his birthday and he could have come across them at that time.    TOC will cont to follow.    Expected Discharge Plan: Group Home Barriers to Discharge: Continued Medical Work up   Patient Goals and CMS Choice Patient states their goals for this hospitalization and ongoing recovery are:: Guardian says that the group home will accept patient back          Expected Discharge Plan and Services   Discharge Planning Services: CM Consult   Living arrangements for the past 2 months: Group Home                 DME Arranged: N/A DME Agency: NA       HH Arranged: NA          Prior Living Arrangements/Services Living arrangements for the past 2 months: Group Home Lives with:: Facility Resident Patient language and need for interpreter  reviewed:: No Do you feel safe going back to the place where you live?: Yes      Need for Family Participation in Patient Care: Yes (Comment) Care giver support system in place?: Yes (comment)   Criminal Activity/Legal Involvement Pertinent to Current Situation/Hospitalization: No - Comment as needed  Activities of Daily Living Home Assistive Devices/Equipment: None ADL Screening (condition at time of admission) Patient's cognitive ability adequate to safely complete daily activities?: Yes Is the patient deaf or have difficulty hearing?: No Does the patient have difficulty seeing, even when wearing glasses/contacts?: Yes Does the patient have difficulty concentrating, remembering, or making decisions?: No Patient able to express need for assistance with ADLs?: Yes Does the patient have difficulty dressing or bathing?: No Independently performs ADLs?: Yes (appropriate for developmental age) Does the patient have difficulty walking or climbing stairs?: No Weakness of Legs: None Weakness of Arms/Hands: None  Permission Sought/Granted Permission sought to share information with : Guardian, Chartered certified accountant granted to share information with : Yes, Verbal Permission Granted  Share Information with NAME: Rob Hickman  Permission granted to share info w AGENCY: [facility]  Permission granted to share info w Relationship: father and legal guardian  Permission granted to share info  w Contact Information: [phone]  Emotional Assessment         Alcohol / Substance Use: Not Applicable Psych Involvement: Yes (comment), Outpatient Provider  Admission diagnosis:  Periumbilical abdominal pain [R10.33] Foreign body ingestion, initial encounter [T18.9XXA] Patient Active Problem List   Diagnosis Date Noted   Foreign body ingestion, initial encounter 07/11/2022   Suicide attempt ([place]) 07/11/2022   Hypothyroidism 07/11/2022    Dyslipidemia 07/11/2022   GERD without esophagitis [id number]   Periumbilical abdominal pain 07/11/2022   Schizoaffective disorder, bipolar type ([place]) 01/21/2021   Chronic constipation 01/20/2021   GERD (gastroesophageal reflux disease) 01/20/2021   Ingestion of foreign body 01/20/2021   Ingestion of foreign body, subsequent encounter 09/08/2019   Ingestion of foreign body, initial encounter 04/29/2018   Suicide ideation 04/29/2018   Schizoaffective disorder, bipolar type ([place])    PTSD (post-traumatic stress disorder)    PCP:  Pcp, No Pharmacy:   [place], [place] - [place] [address] [place] [address] Phone: [phone] Fax: [phone]     Social Determinants of Health (SDOH) Social History: SDOH Screenings   Food Insecurity: Food Insecurity Present (07/12/2022)  Housing: Medium Risk (07/12/2022)  Transportation Needs: No Transportation Needs (07/12/2022)  Utilities: Not At Risk (07/12/2022)  Alcohol Screen: Low Risk  (01/21/2021)  Tobacco Use: High Risk (07/12/2022)   SDOH Interventions:     Readmission Risk Interventions     No data to display

## 2022-07-12 NOTE — Progress Notes (Signed)
PHARMACIST - PHYSICIAN ORDER COMMUNICATION  Ever Colombe is a 42 y.o. year old male with a history of schizo-affective disorder on Clozapine PTA. Continuing this medication order as an inpatient requires that monitoring parameters per REMS requirements must be met.   Clozapine REMS Dispense Authorization was obtained, and will dispense inpatient.  RDA code [id number].  Verified Clozapine dose: restarting at 18m at bedtime (unclear how long he has been off)  Last ANC value and date reported on the Clozapine REMS website: 2/12 14,200 ANC monitoring frequency: weekly Next ANC reporting is due on (date) 2/19.  LPaulina Fusi PharmD, BCPS 07/12/2022 2:50 PM

## 2022-07-12 NOTE — Progress Notes (Signed)
Progress Note   Patient: Lawrence Morgan [id number] DOB: January 01, 1981 DOA: 07/10/2022     1 DOS: the patient was seen and examined on 07/12/2022   Brief hospital course: Taken from H&P.  [name] is a 42 y.o. male with medical history significant for schizoaffective disorder bipolar type, PTSD, marijuana abuse and intellectual disability, who presented to the emergency room from his group home with his assistant with acute onset of abdominal pain after ingesting 4-9V batteries 3 days ago.  He he apparently also swallowed 3 screws.  Patient also endorsed suicidal thoughts.  ED Course: When he came to the ER vital signs were within normal.  Labs revealed normal CMP and CBC showed no leukocytosis of 11.9.  Tylenol level was less than 10 and salicylate less than 7.  His influenza COVID-19 and RSV PCR came back negative   EKG as reviewed by me :  EKG showed normal sinus rhythm with a rate of 85 Imaging: 1 abdomen x-ray revealed the following: 1. Total of 3 screws overlying the right lower abdomen are noted (measuring approximately 4 cm). 2. Total of 3 rectangular batteries (measuring approximately 6.5 x 3.5 cm) overlying the mid abdomen. 3. Nonobstructive bowel gas pattern. 4. No findings to suggest pneumoperitoneum/bowel perforation with markedly limited evaluation on this supine radiograph. Recommend CT abdomen/pelvis with intravenous contrast for further evaluation.  Abdominal and pelvic CT scan without contrast revealed the following: 1. Three 5 x 2.5 cm rectangular batteries within the gastric lumen. 2. Three 3 cm screws within the cecal lumen. 3. No bowel perforation. 4. Bilateral trace pleural effusions.   Dr. Marius Ditch was contacted about the patient and will plan for EGD in a.m.   Psych was also consulted and patient will be transferred to behavioral health after medical clearance.  2/11: Vitals and labs stable.  Leukocytosis resolved.  EGD did show 3 9V batteries in the stomach,  unsuccessful attempt to remove them.  Please see the report.  General surgery was consulted for surgical removal.  Also found to have a small gastric ulcer with clean base.  2/12: Mildly elevated blood pressure at 143/90.  He was emergently taken to the OR after unsuccessful EGD due to increased risk of perforation s/p gastrostomy with removal of 3 9 V batteries.  Out of proportion pain this morning so surgical team ordered upper GI series to rule out any leakage from gastrostomy lesion. 2 separate notes from psych, initial one saying that he will go to behavioral health due to suicidal intention after medical clearance and treatment, followed by a second note saying that he is not a threat and does not need inpatient psych.  This is his second incidence and he was very clear initially that he did swallowed those things with intention to harm himself. Patient has a very extensive psych history.  Message sent to Dr. Weber Cooks for clarification, according to his secure chat patient has an habit of swallowing different objects due to his underlying psych illness.  No concern of suicide. CBC with worsening leukocytosis, most likely reactive with surgery.  Hemoglobin stable.   Assessment and Plan: * Foreign body ingestion, initial encounter Per psych this is not a suicidal attempt as patient has an habit of swallowing unusual objects.  S/p gastrostomy with removal of 3 9V batteries from his stomach by general surgery after a failed attempt via EGD. Out of proportion pain this morning. -Surgery ordered upper GI series to rule out any leakage. -Being managed by general surgery now  Dyslipidemia - We will continue statin therapy.  Hypothyroidism - We will continue Synthroid.  Schizoaffective disorder, bipolar type ([place]) - We will continue his psychotropic medications. - Psychiatry consult , 2 different notes 1 stating that he will go to behavioral health and other stating no need for inpatient  BH. -Psych to clarify intention and recommendations  GERD without esophagitis - We will continue his PPI therapy.  Suicide attempt [facility]) Was evaluated by psychiatry, with 2 different notes in ED. -Message sent to Dr. Weber Cooks to clarify about disposition  Depression-resolved as of 07/11/2022 - We will continue his Zoloft and trazodone as well as his clozapine with Cogentin..   Subjective: Patient seems calm learnt after getting morphine earlier.  Complaining of a lot of upper abdominal pain.  Physical Exam: Vitals:   07/12/22 0800 07/12/22 0900 07/12/22 1000 07/12/22 1100  BP: (!) 140/84 139/87 135/82 (!) 145/82  Pulse: 94 92 92 92  Resp: (!) 37 (!) 28 17 (!) 23  Temp:      TempSrc:      SpO2: 95% 99% 98% 97%  Weight:      Height:       General.  Obese gentleman, in no acute distress. Pulmonary.  Lungs clear bilaterally, normal respiratory effort. CV.  Regular rate and rhythm, no JVD, rub or murmur. Abdomen.  Soft, diffuse tenderness, mildly distended, BS hypoactive.   CNS.  Somnolent.  No focal neurologic deficit. Extremities.  No edema, no cyanosis, pulses intact and symmetrical.  Data Reviewed: Prior data reviewed  Family Communication: Tried calling father with no success  Disposition: Status is: Inpatient   Planned Discharge Destination: To be determined Time spent:  50 minutes  This record has been created using Systems analyst. Errors have been sought and corrected,but may not always be located. Such creation errors do not reflect on the standard of care.   Author: Lorella Nimrod, MD 07/12/2022 1:05 PM  For on call review [email or web address].

## 2022-07-12 NOTE — Progress Notes (Signed)
Addendum to my previous note:  I reviewed the upper GI series.  No sign of contrast leak.  Will restart soft diet.  Continue pain management.

## 2022-07-12 NOTE — Assessment & Plan Note (Signed)
Was evaluated by psychiatry, with 2 different notes in ED. -Message sent to Dr. Weber Cooks to clarify about disposition

## 2022-07-12 NOTE — Progress Notes (Signed)
Patient went down to get upper GI study.

## 2022-07-12 NOTE — Progress Notes (Signed)
Patient ID: Lawrence Morgan, male   DOB: [birth year], 42 y.o.   MRN: [id number]     [facility] [place] Hospital Day(s): 1.   Interval History: Patient seen and examined, no acute events or new complaints overnight. Patient reports having a lot of pain. Pain exacerbated by breathing. No nausea or vomiting.   Vital signs in last 24 hours: [min-max] current  Temp:  [96.9 F (36.1 C)-98.9 F (37.2 C)] 98.9 F (37.2 C) (02/12 0700) Pulse Rate:  [80-108] 100 (02/12 0700) Resp:  [13-36] 20 (02/12 0700) BP: (103-151)/(59-96) 143/90 (02/12 0700) SpO2:  [95 %-100 %] 98 % (02/12 0700) Weight:  [104 kg-116.2 kg] 116.2 kg (02/11 1835)     Height: 5' 10"$  (177.8 cm) Weight: 116.2 kg BMI (Calculated): 36.76   Physical Exam:  Constitutional: alert, cooperative and no distress  Respiratory: breathing non-labored at rest  Cardiovascular: regular rate and sinus rhythm  Gastrointestinal: soft, tender in the upper abdomen, and non-distended  Labs:     Latest Ref Rng & Units 07/11/2022    4:36 AM 07/10/2022   11:34 PM 07/21/2021   12:49 PM  CBC  WBC 4.0 - 10.5 K/uL 8.9  11.9  5.1   Hemoglobin 13.0 - 17.0 g/dL 11.7  13.0  14.3   Hematocrit 39.0 - 52.0 % 37.9  41.3  45.3   Platelets 150 - 400 K/uL 210  235  228       Latest Ref Rng & Units 07/11/2022    4:36 AM 07/10/2022   11:34 PM 07/19/2021   10:40 PM  CMP  Glucose 70 - 99 mg/dL 113  93  96   BUN 6 - 20 mg/dL 10  11  16   $ Creatinine 0.61 - 1.24 mg/dL 0.90  1.01  0.98   Sodium 135 - 145 mmol/L 137  138  137   Potassium 3.5 - 5.1 mmol/L 3.8  3.7  4.0   Chloride 98 - 111 mmol/L 108  107  104   CO2 22 - 32 mmol/L 25  24  28   $ Calcium 8.9 - 10.3 mg/dL 8.9  9.5  9.1   Total Protein 6.5 - 8.1 g/dL  7.6  7.2   Total Bilirubin 0.3 - 1.2 mg/dL  0.6  0.5   Alkaline Phos 38 - 126 U/L  78  65   AST 15 - 41 U/L  24  16   ALT 0 - 44 U/L  24  14     Imaging studies: No new pertinent imaging studies   Assessment/Plan:  42 y.o. male with gastric  foreign object 1 Day Post-Op s/p gastrotomy with removal of foreign object, complicated by pertinent comorbidities including schizoaffective disorder.  Pain today out of proportion. Will order Upper GI series to rule out leak from gastric repair. If negative may resume soft diet.  Continue pain management  Continue psych care by Hospitalist and/or [place] team. First [place] note says that patient was suicidal and will need further treatment. Second [place] note says that patient is not at risk of self harm which swallowing batteries I thought that it was harmful and patient is now after surgery. This is a recurrent issue which does not seem to be controlled. Hopefully he will get better psychiatric care to try to avoid recurrent episode of self harming with swallowing foreign objects.   Arnold Long, MD

## 2022-07-12 NOTE — Progress Notes (Signed)
Spoke with Dr. Reesa Chew regarding patients order for 1 on 1 suicide precautions. Per report from third shift RN they were not active orders but order remains in place and no paperwork in chart. MD to review and come evaluate patient. Spoke with charge nurse regarding needing sitter -active order for 1-1. Continue to assess.

## 2022-07-13 DIAGNOSIS — F250 Schizoaffective disorder, bipolar type: Secondary | ICD-10-CM | POA: Diagnosis not present

## 2022-07-13 DIAGNOSIS — T189XXA Foreign body of alimentary tract, part unspecified, initial encounter: Secondary | ICD-10-CM | POA: Diagnosis not present

## 2022-07-13 DIAGNOSIS — E785 Hyperlipidemia, unspecified: Secondary | ICD-10-CM | POA: Diagnosis not present

## 2022-07-13 DIAGNOSIS — F32A Depression, unspecified: Secondary | ICD-10-CM | POA: Diagnosis not present

## 2022-07-13 LAB — CBC
HCT: 36.2 % — ABNORMAL LOW (ref 39.0–52.0)
Hemoglobin: 11.4 g/dL — ABNORMAL LOW (ref 13.0–17.0)
MCH: 26.9 pg (ref 26.0–34.0)
MCHC: 31.5 g/dL (ref 30.0–36.0)
MCV: 85.4 fL (ref 80.0–100.0)
Platelets: 205 10*3/uL (ref 150–400)
RBC: 4.24 MIL/uL (ref 4.22–5.81)
RDW: 13.6 % (ref 11.5–15.5)
WBC: 11.7 10*3/uL — ABNORMAL HIGH (ref 4.0–10.5)
nRBC: 0 % (ref 0.0–0.2)

## 2022-07-13 MED ORDER — ROSUVASTATIN CALCIUM 10 MG PO TABS
20.0000 mg | ORAL_TABLET | Freq: Every day | ORAL | Status: DC
  Administered 2022-07-13 – 2022-07-14 (×2): 20 mg via ORAL
  Filled 2022-07-13 (×2): qty 2

## 2022-07-13 MED ORDER — LEVOTHYROXINE SODIUM 50 MCG PO TABS
25.0000 ug | ORAL_TABLET | Freq: Every day | ORAL | Status: DC
  Administered 2022-07-14 – 2022-07-15 (×2): 25 ug via ORAL
  Filled 2022-07-13 (×2): qty 1

## 2022-07-13 NOTE — Anesthesia Preprocedure Evaluation (Signed)
Anesthesia Evaluation  Patient identified by MRN, date of birth, ID band Patient awake    Reviewed: Allergy & Precautions, NPO status , Patient's Chart, lab work & pertinent test results  History of Anesthesia Complications Negative for: history of anesthetic complications  Airway Mallampati: III  TM Distance: >3 FB Neck ROM: full    Dental no notable dental hx.    Pulmonary asthma , Current Smoker   Pulmonary exam normal        Cardiovascular negative cardio ROS Normal cardiovascular exam     Neuro/Psych  PSYCHIATRIC DISORDERS Anxiety Depression Bipolar Disorder Schizophrenia  negative neurological ROS     GI/Hepatic ,GERD  Medicated,,(+)     substance abuse  marijuana use  Endo/Other  Hypothyroidism    Renal/GU negative Renal ROS  negative genitourinary   Musculoskeletal   Abdominal   Peds  Hematology negative hematology ROS (+)   Anesthesia Other Findings Past Medical History: No date: Intellectual disability     Comment:  mild No date: Marijuana use No date: Personality disorder ([place]) No date: PTSD (post-traumatic stress disorder) 04/29/2018: Schizo-affective schizophrenia ([place]) No date: Schizoaffective disorder, bipolar type ([place])  Past Surgical History: 04/30/2018: ESOPHAGOGASTRODUODENOSCOPY (EGD) WITH PROPOFOL; N/A     Comment:  Procedure: ESOPHAGOGASTRODUODENOSCOPY (EGD) WITH               PROPOFOL;  Surgeon: Otis Brace, MD;  Location: [facility]               ENDOSCOPY;  Service: Gastroenterology;  Laterality: N/A; 01/20/2021: ESOPHAGOGASTRODUODENOSCOPY (EGD) WITH PROPOFOL; N/A     Comment:  Procedure: ESOPHAGOGASTRODUODENOSCOPY (EGD) WITH               PROPOFOL;  Surgeon: Lucilla Lame, MD;  Location: [facility]               ENDOSCOPY;  Service: Endoscopy;  Laterality: N/A; 04/30/2018: FOREIGN BODY REMOVAL; N/A     Comment:  Procedure: FOREIGN BODY REMOVAL;  Surgeon: Otis Brace,  MD;  Location: [facility];  Service:               Gastroenterology;  Laterality: N/A; No date: FRACTURE SURGERY     Comment:   r ankle   BMI    Body Mass Index: 32.90 kg/m      Reproductive/Obstetrics negative OB ROS                             Anesthesia Physical Anesthesia Plan  ASA: 2  Anesthesia Plan: General   Post-op Pain Management: Toradol IV (intra-op) and Dilaudid IV   Induction: Intravenous  PONV Risk Score and Plan: 2 and Dexamethasone, Ondansetron and Treatment may vary due to age or medical condition  Airway Management Planned: Oral ETT  Additional Equipment:   Intra-op Plan:   Post-operative Plan: Extubation in OR  Informed Consent: I have reviewed the patients History and Physical, chart, labs and discussed the procedure including the risks, benefits and alternatives for the proposed anesthesia with the patient or authorized representative who has indicated his/her understanding and acceptance.     Dental Advisory Given  Plan Discussed with: Anesthesiologist, CRNA and Surgeon  Anesthesia Plan Comments: (Patient transported from GI suite to OR7 intubated with appropriate monitors and ambu bag. )        Anesthesia Quick Evaluation

## 2022-07-13 NOTE — Evaluation (Signed)
Occupational Therapy Evaluation Patient Details Name: Lawrence Morgan MRN: [id number] DOB: 10/18/1980 Today's Date: 07/13/2022   History of Present Illness 42 y.o. male with medical history significant for schizoaffective disorder bipolar type, PTSD, marijuana abuse and intellectual disability, who presented to the emergency room from his group home with his assistant with acute onset of abdominal pain after ingesting 4-9V batteries 3 days ago.  He he apparently also swallowed 3 screws.   Clinical Impression   Patient presenting with decreased Ind in self care,balance, functional mobility/transfers, endurance, and safety awareness. Patient reports living in group home for several year. Pt endorses being Ind at baseline for self care and mobility. He does endorse chore activities such as taking out the trash and sweeping.  Patient endorsing 8/10 pain in abdomen and given pain medication during session. Pt performs bed mobility with min guard and cues for log roll to EOB. Pt stands with supervision and given RW for energy conservation and pain and was able to ambulate 250' with supervision. HR increased to 130's and RR as high as 31. Pt returning to bed at end of session to rest. Call bell and all needed items within reach. Patient will benefit from acute OT to increase overall independence in the areas of ADLs, functional mobility, and safety awareness in order to safely discharge back to group home.      Recommendations for follow up therapy are one component of a multi-disciplinary discharge planning process, led by the attending physician.  Recommendations may be updated based on patient status, additional functional criteria and insurance authorization.   Follow Up Recommendations  No OT follow up     Assistance Recommended at Discharge Intermittent Supervision/Assistance  Patient can return home with the following A little help with bathing/dressing/bathroom;A little help with walking and/or  transfers;Help with stairs or ramp for entrance;Assist for transportation;Assistance with cooking/housework    Functional Status Assessment  Patient has had a recent decline in their functional status and demonstrates the ability to make significant improvements in function in a reasonable and predictable amount of time.  Equipment Recommendations  None recommended by OT       Precautions / Restrictions Precautions Precautions: Fall      Mobility Bed Mobility Overal bed mobility: Needs Assistance Bed Mobility: Supine to Sit, Sit to Supine     Supine to sit: Min guard Sit to supine: Supervision   General bed mobility comments: cuing for technique for pain management    Transfers Overall transfer level: Needs assistance Equipment used: Rolling walker (2 wheels) Transfers: Sit to/from Stand, Bed to chair/wheelchair/BSC Sit to Stand: Supervision     Step pivot transfers: Supervision            Balance Overall balance assessment: Needs assistance Sitting-balance support: Feet supported Sitting balance-Leahy Scale: Good     Standing balance support: During functional activity, Bilateral upper extremity supported, Reliant on assistive device for balance Standing balance-Leahy Scale: Fair                             ADL either performed or assessed with clinical judgement   ADL Overall ADL's : Needs assistance/impaired                                       General ADL Comments: supervision for UB self care with S - min A for LB  self care secondary to increased pain     Vision Patient Visual Report: No change from baseline              Pertinent Vitals/Pain Pain Assessment Pain Assessment: 0-10 Pain Score: 8  Pain Location: abdomen Pain Descriptors / Indicators: Aching, Grimacing, Discomfort Pain Intervention(s): Monitored during session, Repositioned, RN gave pain meds during session     Hand Dominance Right    Extremity/Trunk Assessment Upper Extremity Assessment Upper Extremity Assessment: Overall WFL for tasks assessed;Generalized weakness   Lower Extremity Assessment Lower Extremity Assessment: Overall WFL for tasks assessed;Generalized weakness       Communication Communication Communication: No difficulties   Cognition Arousal/Alertness: Awake/alert Behavior During Therapy: WFL for tasks assessed/performed Overall Cognitive Status: Impaired/Different from baseline                                 General Comments: Pt is pleasant and cooperative and follows 1 step commands with increased time to process                Home Living Family/patient expects to be discharged to:: Group home                                 Additional Comments: Per chart review, pt lives at [facility] group home      Prior Functioning/Environment Prior Level of Function : Independent/Modified Independent               ADLs Comments: Pt reports being Ind in self care and mobility. He has chores at group home such as sweeping and taking the trash out.        OT Problem List: Decreased strength;Decreased activity tolerance;Impaired balance (sitting and/or standing);Decreased knowledge of use of DME or AE;Decreased safety awareness;Pain      OT Treatment/Interventions: Self-care/ADL training;Therapeutic exercise;Therapeutic activities;Energy conservation;DME and/or AE instruction;Patient/family education;Balance training;Manual therapy    OT Goals(Current goals can be found in the care plan section) Acute Rehab OT Goals Patient Stated Goal: to decrease pain OT Goal Formulation: With patient Time For Goal Achievement: 07/27/22 Potential to Achieve Goals: Good ADL Goals Pt Will Perform Grooming: with modified independence;standing Pt Will Perform Lower Body Dressing: with modified independence;sit to/from stand Pt Will Transfer to Toilet: with  modified independence;ambulating Pt Will Perform Toileting - Clothing Manipulation and hygiene: with modified independence;sit to/from stand  OT Frequency: Min 2X/week       AM-PAC OT "6 Clicks" Daily Activity     Outcome Measure Help from another person eating meals?: None Help from another person taking care of personal grooming?: None Help from another person toileting, which includes using toliet, bedpan, or urinal?: A Little Help from another person bathing (including washing, rinsing, drying)?: A Little Help from another person to put on and taking off regular upper body clothing?: None Help from another person to put on and taking off regular lower body clothing?: A Little 6 Click Score: 21   End of Session Equipment Utilized During Treatment: Rolling walker (2 wheels) Nurse Communication: Mobility status  Activity Tolerance: Patient tolerated treatment well Patient left: in bed;with call bell/phone within reach;with bed alarm set  OT Visit Diagnosis: Unsteadiness on feet (R26.81);Repeated falls (R29.6);Muscle weakness (generalized) (M62.81)                Time: [id number] OT Time Calculation (min): 24  min Charges:  OT General Charges $OT Visit: 1 Visit OT Evaluation $OT Eval Low Complexity: 1 Low OT Treatments $Therapeutic Activity: 8-22 mins  Darleen Crocker, MS, OTR/L , CBIS ascom [phone]  07/13/22, 2:49 PM

## 2022-07-13 NOTE — Progress Notes (Signed)
Patient ID: Lawrence Morgan, male   DOB: 1980-06-24, 42 y.o.   MRN: [id number]     [place] Hospital Day(s): 2.   Interval History: Patient seen and examined.  Patient found again sleeping and snoring.  When I woke the patient up and asked about how he was feeling he just said okay.  He immediately went back to sleep.  When I pressed his abdomen there was no grimacing or tenderness.  Upon discussion with nurse there has been no complaint of nausea or vomiting.  Last pain medication was given yesterday at 4 PM.  Vital signs in last 24 hours: [min-max] current  Temp:  [98.3 F (36.8 C)-99.6 F (37.6 C)] 99.6 F (37.6 C) (02/13 0500) Pulse Rate:  [92-100] 93 (02/13 0700) Resp:  [13-38] 28 (02/13 0700) BP: (130-159)/(82-100) 152/95 (02/13 0700) SpO2:  [93 %-99 %] 94 % (02/13 0700)     Height: 5' 10"$  (177.8 cm) Weight: 116.2 kg BMI (Calculated): 36.76   Physical Exam:  Constitutional: Drowsy Respiratory: breathing non-labored at rest  Cardiovascular: regular rate and sinus rhythm  Gastrointestinal: soft, non-tender, and non-distended  Labs:     Latest Ref Rng & Units 07/13/2022    5:39 AM 07/12/2022   11:09 AM 07/11/2022    4:36 AM  CBC  WBC 4.0 - 10.5 K/uL 11.7  17.7    17.7  8.9   Hemoglobin 13.0 - 17.0 g/dL 11.4  12.0    12.1  11.7   Hematocrit 39.0 - 52.0 % 36.2  38.4    38.2  37.9   Platelets 150 - 400 K/uL 205  213    209  210       Latest Ref Rng & Units 07/12/2022   11:09 AM 07/11/2022    4:36 AM 07/10/2022   11:34 PM  CMP  Glucose 70 - 99 mg/dL 107  113  93   BUN 6 - 20 mg/dL 11  10  11   $ Creatinine 0.61 - 1.24 mg/dL 0.97  0.90  1.01   Sodium 135 - 145 mmol/L 138  137  138   Potassium 3.5 - 5.1 mmol/L 3.8  3.8  3.7   Chloride 98 - 111 mmol/L 109  108  107   CO2 22 - 32 mmol/L 22  25  24   $ Calcium 8.9 - 10.3 mg/dL 8.9  8.9  9.5   Total Protein 6.5 - 8.1 g/dL   7.6   Total Bilirubin 0.3 - 1.2 mg/dL   0.6   Alkaline Phos 38 - 126 U/L   78   AST 15 - 41  U/L   24   ALT 0 - 44 U/L   24     Imaging studies: No new pertinent imaging studies   Assessment/Plan:  42 y.o. male with gastric foreign object 2 Day Post-Op s/p gastrotomy with removal of foreign object, complicated by pertinent comorbidities including schizoaffective disorder.   -Adequate vital signs, no fever -Pain seems to be improving -Patient still sleepy, not significantly cooperative with history or physical exam -As per nurse, he tolerated soft diet yesterday.  No nausea or vomiting. -White blood cell count decreasing trend -From the surgical standpoint patient can be discharged once medically and psychiatric stable  Arnold Long, MD

## 2022-07-13 NOTE — Progress Notes (Signed)
Patient transferred to room 14 for maintenance of room 17. Father called and updated within nursing scope of practice. Appreciative for update and care to patient. Will call for another update tomorrow. Patient refused 1800 ordered lithium and 1000 miralax. Poor appetite. Ate 2 vanilla ice creams only.

## 2022-07-13 NOTE — Progress Notes (Signed)
  Chaplain On-Call responded to a call from [place] who reported the patient's request to speak with a Chaplain.  Chaplain met the patient and offered supportive listening as he described his hospitalization and surgery due to swallowing several batteries. He stated that he had done this previously.  Chaplain discussed patient's feelings about himself that prompted this method of self-harm. The patient stated that he remembers being a patient in the [facility], and stated his desire that he could return there.  Chaplain provided spiritual and emotional support and prayer. Chaplain spoke to Nurse Lysbeth Galas, who stated that the plan is for the patient to be admitted to the [facility] when he is medically stable.  Chaplain Charlie  M.Div., [facility]

## 2022-07-13 NOTE — Progress Notes (Signed)
Patient transferred to room 17 from 18 for maintenance of room. All belongings transferred with patient and patient provided education as to why he was being moved. Tele sitter confirmed move of equipment and visualization of patient in room 17. Patient requested to be visited by [place]. NS [employer] and he came to bedside.

## 2022-07-13 NOTE — Progress Notes (Signed)
Progress Note   Patient: Lawrence Morgan [id number] DOB: [birth year] DOA: 07/10/2022     2 DOS: the patient was seen and examined on 07/13/2022   Brief hospital course: Taken from H&P.  Mohammedali Korsak is a 42 y.o. male with medical history significant for schizoaffective disorder bipolar type, PTSD, marijuana abuse and intellectual disability, who presented to the emergency room from his group home with his assistant with acute onset of abdominal pain after ingesting 4-9V batteries 3 days ago.  He he apparently also swallowed 3 screws.  Patient also endorsed suicidal thoughts.  ED Course: When he came to the ER vital signs were within normal.  Labs revealed normal CMP and CBC showed no leukocytosis of 11.9.  Tylenol level was less than 10 and salicylate less than 7.  His influenza COVID-19 and RSV PCR came back negative   EKG as reviewed by me :  EKG showed normal sinus rhythm with a rate of 85 Imaging: 1 abdomen x-ray revealed the following: 1. Total of 3 screws overlying the right lower abdomen are noted (measuring approximately 4 cm). 2. Total of 3 rectangular batteries (measuring approximately 6.5 x 3.5 cm) overlying the mid abdomen. 3. Nonobstructive bowel gas pattern. 4. No findings to suggest pneumoperitoneum/bowel perforation with markedly limited evaluation on this supine radiograph. Recommend CT abdomen/pelvis with intravenous contrast for further evaluation.  Abdominal and pelvic CT scan without contrast revealed the following: 1. Three 5 x 2.5 cm rectangular batteries within the gastric lumen. 2. Three 3 cm screws within the cecal lumen. 3. No bowel perforation. 4. Bilateral trace pleural effusions.   Dr. Marius Ditch was contacted about the patient and will plan for EGD in a.m.   Psych was also consulted and patient will be transferred to behavioral health after medical clearance.  2/11: Vitals and labs stable.  Leukocytosis resolved.  EGD did show 3 9V batteries in the stomach,  unsuccessful attempt to remove them.  Please see the report.  General surgery was consulted for surgical removal.  Also found to have a small gastric ulcer with clean base.  2/12: Mildly elevated blood pressure at 143/90.  He was emergently taken to the OR after unsuccessful EGD due to increased risk of perforation s/p gastrostomy with removal of 3 9 V batteries.  Out of proportion pain this morning so surgical team ordered upper GI series to rule out any leakage from gastrostomy lesion. 2 separate notes from psych, initial one saying that he will go to behavioral health due to suicidal intention after medical clearance and treatment, followed by a second note saying that he is not a threat and does not need inpatient psych.  This is his second incidence and he was very clear initially that he did swallowed those things with intention to harm himself. Patient has a very extensive psych history.  Message sent to Dr. Weber Cooks for clarification, according to his secure chat patient has an habit of swallowing different objects due to his underlying psych illness.  No concern of suicide. CBC with worsening leukocytosis, most likely reactive with surgery.  Hemoglobin stable.  2/13: Mildly elevated blood pressure at 153/84, upper GI series done yesterday was limited as patient was unable to tolerate contrast and difficulty in positioning but did not show any obvious gastric perforation along the fundus.  Today pain is much improved and tolerating soft diet.  Labs with improving leukocytosis.  Surgery cleared him for discharge. PT ordered and message sent to psych as he will be transferred to behavioral  health.   Assessment and Plan: * Foreign body ingestion, initial encounter Per psych this is not a suicidal attempt as patient has an habit of swallowing unusual objects.  S/p gastrotomy with removal of 3 9V batteries from his stomach by general surgery after a failed attempt via EGD.  Not tolerating soft diet,  no bowel movements yet.  Upper GI series with difficult study but did not show any leak. -Surgery cleared him for discharge. -Will be going to behavioral health -PT ordered to encourage ambulation  Dyslipidemia - We will continue statin therapy.  Hypothyroidism - We will continue Synthroid.  Schizoaffective disorder, bipolar type ([place]) - We will continue his psych  medications. - Psychiatry consult , patient will be going to behavioral health once bed is available -General surgery cleared him for discharge  GERD without esophagitis - We will continue his PPI therapy.  Suicide attempt [facility]) Was evaluated by psychiatry, will be going to behavioral health.  Depression-resolved as of 07/11/2022 - We will continue his Zoloft and trazodone as well as his clozapine with Cogentin..   Subjective: Patient was little somnolent when seen today.  Complaining of abdominal pain with movements.  Tolerating soft diet.  No bowel movement yet  Physical Exam: Vitals:   07/13/22 1000 07/13/22 1100 07/13/22 1200 07/13/22 1300  BP: (!) 145/83 (!) 142/93 (!) 140/87 (!) 139/97  Pulse: 91 88 92 90  Resp: (!) 33 (!) 35 (!) 33 (!) 29  Temp:   98.5 F (36.9 C)   TempSrc:   Oral   SpO2: 96% 97% 95% 99%  Weight:      Height:       General.  Obese gentleman, in no acute distress. Pulmonary.  Lungs clear bilaterally, normal respiratory effort. CV.  Regular rate and rhythm, no JVD, rub or murmur. Abdomen.  Soft, nontender, nondistended, BS positive. CNS.  Alert and oriented .  No focal neurologic deficit. Extremities.  No edema, no cyanosis, pulses intact and symmetrical. Psychiatry.  Appears to have some cognitive impairment.  Data Reviewed: Prior data reviewed  Family Communication: Called father on phone  Disposition: Status is: Inpatient   Planned Discharge Destination: To be determined Time spent:  45 minutes  This record has been created using Systems analyst. Errors  have been sought and corrected,but may not always be located. Such creation errors do not reflect on the standard of care.   Author: Lorella Nimrod, MD 07/13/2022 1:59 PM  For on call review [email or web address].

## 2022-07-14 ENCOUNTER — Inpatient Hospital Stay: Payer: Medicare Other

## 2022-07-14 DIAGNOSIS — E785 Hyperlipidemia, unspecified: Secondary | ICD-10-CM | POA: Diagnosis not present

## 2022-07-14 DIAGNOSIS — F32A Depression, unspecified: Secondary | ICD-10-CM | POA: Diagnosis not present

## 2022-07-14 DIAGNOSIS — F250 Schizoaffective disorder, bipolar type: Secondary | ICD-10-CM | POA: Diagnosis not present

## 2022-07-14 DIAGNOSIS — T189XXA Foreign body of alimentary tract, part unspecified, initial encounter: Secondary | ICD-10-CM | POA: Diagnosis not present

## 2022-07-14 MED ORDER — GUAIFENESIN-DM 100-10 MG/5ML PO SYRP
5.0000 mL | ORAL_SOLUTION | ORAL | Status: DC | PRN
  Administered 2022-07-14: 5 mL via ORAL
  Filled 2022-07-14: qty 10

## 2022-07-14 MED ORDER — SALINE SPRAY 0.65 % NA SOLN
1.0000 | NASAL | Status: DC | PRN
  Filled 2022-07-14: qty 44

## 2022-07-14 MED ORDER — LORATADINE 10 MG PO TABS
10.0000 mg | ORAL_TABLET | Freq: Every day | ORAL | Status: DC
  Administered 2022-07-14 – 2022-07-15 (×2): 10 mg via ORAL
  Filled 2022-07-14 (×2): qty 1

## 2022-07-14 MED ORDER — MAGNESIUM HYDROXIDE 400 MG/5ML PO SUSP: 30.0000 mL | Freq: Every day | ORAL | Status: DC | PRN

## 2022-07-14 NOTE — Consult Note (Signed)
[facility] Consult   Reason for Consult: Follow-up consult 42 year old man with a history of intellectual disability and mood disorder and chronic behavior issues.  Follow-up on his swallowing batteries. Referring Physician: Reesa Chew Patient Identification: Lawrence Morgan MRN:  [id number] Principal Diagnosis: Foreign body ingestion, initial encounter Diagnosis:  Principal Problem:   Foreign body ingestion, initial encounter Active Problems:   Schizoaffective disorder, bipolar type ([place])   Suicide attempt ([place])   Hypothyroidism   Dyslipidemia   GERD without esophagitis   Total Time spent with patient: 20 minutes  Subjective:   Lawrence Morgan is a 42 y.o. male patient admitted with "I really do not remember".  HPI: Patient was seen for follow-up.  42 year old man with a history of intellectual disability and a diagnosis of schizoaffective disorder.  Came to the hospital having swallowed inanimate objects including 9 V batteries.  Required endoscopy as well as surgery for ultimately removal.  Patient earlier in his stay had endorsed hallucinations and endorsed nonspecific suicidal thoughts in the context of saying he did not want to go back to his group home.  Here in the hospital he has not displayed any dangerous aggressive violent or suicidal behavior.  He told me that he had learned his lesson about swallowing things.  In conversation today he tells me he is feeling "great".  Denies any suicidal intent or plan.  Does not appear to be responding to internal stimuli.  Has been cooperative with treatment here in the hospital and is able to articulate positive plans for the future.  He is back on most of his normal medication  Past Psychiatric History: History of IDD with a history of swallowing inanimate objects in the past.  Schizoaffective disorder.  Has been on multiple antipsychotics often with endorsement of symptoms even on high dose antipsychotics.  Risk to Self:   Risk to  Others:   Prior Inpatient Therapy:   Prior Outpatient Therapy:    Past Medical History:  Past Medical History:  Diagnosis Date   Intellectual disability    mild   Marijuana use    Personality disorder ([place])    PTSD (post-traumatic stress disorder)    Schizo-affective schizophrenia ([place]) 04/29/2018   Schizoaffective disorder, bipolar type [facility])     Past Surgical History:  Procedure Laterality Date   ESOPHAGOGASTRODUODENOSCOPY N/A 07/11/2022   Procedure: ESOPHAGOGASTRODUODENOSCOPY (EGD);  Surgeon: Lin Landsman, MD;  Location: [facility] ENDOSCOPY;  Service: Gastroenterology;  Laterality: N/A;   ESOPHAGOGASTRODUODENOSCOPY (EGD) WITH PROPOFOL N/A 04/30/2018   Procedure: ESOPHAGOGASTRODUODENOSCOPY (EGD) WITH PROPOFOL;  Surgeon: Otis Brace, MD;  Location: [place];  Service: Gastroenterology;  Laterality: N/A;   ESOPHAGOGASTRODUODENOSCOPY (EGD) WITH PROPOFOL N/A 01/20/2021   Procedure: ESOPHAGOGASTRODUODENOSCOPY (EGD) WITH PROPOFOL;  Surgeon: Lucilla Lame, MD;  Location: [facility] ENDOSCOPY;  Service: Endoscopy;  Laterality: N/A;   FOREIGN BODY REMOVAL N/A 04/30/2018   Procedure: FOREIGN BODY REMOVAL;  Surgeon: Otis Brace, MD;  Location: [place];  Service: Gastroenterology;  Laterality: N/A;   FRACTURE SURGERY      r ankle    LAPAROTOMY N/A 07/11/2022   Procedure: EXPLORATORY LAPAROTOMY;  Surgeon: Herbert Pun, MD;  Location: [facility] ORS;  Service: General;  Laterality: N/A;   Family History: History reviewed. No pertinent family history. Family Psychiatric  History: See previous Social History:  Social History   Substance and Sexual Activity  Alcohol Use Never     Social History   Substance and Sexual Activity  Drug Use Never   Types: Marijuana   Comment: 3  to 4 months ago stated 07/11/22    Social History   Socioeconomic History   Marital status: Single    Spouse name: Not on file   Number of children: Not on file   Years of education: Not on file    Highest education level: Not on file  Occupational History   Not on file  Tobacco Use   Smoking status: Every Day    Packs/day: 5.00    Types: Cigarettes, Cigars   Smokeless tobacco: Never  Vaping Use   Vaping Use: Some days   Substances: Nicotine  Substance and Sexual Activity   Alcohol use: Never   Drug use: Never    Types: Marijuana    Comment: 3 to 4 months ago stated 07/11/22   Sexual activity: Not Currently  Other Topics Concern   Not on file  Social History Narrative   ** Merged History Encounter **       Social Determinants of Health   Financial Resource Strain: Not on file  Food Insecurity: Food Insecurity Present (07/12/2022)   Hunger Vital Sign    Worried About Running Out of Food in the Last Year: Sometimes true    Ran Out of Food in the Last Year: Sometimes true  Transportation Needs: No Transportation Needs (07/12/2022)   PRAPARE - Hydrologist (Medical): No    Lack of Transportation (Non-Medical): No  Physical Activity: Not on file  Stress: Not on file  Social Connections: Not on file   Additional Social History:    Allergies:   Allergies  Allergen Reactions   Haldol [Haloperidol Lactate]    Haldol [Haloperidol Lactate] Other (See Comments)    Unknown   Shellfish Allergy Itching and Rash    Labs:  Results for orders placed or performed during the hospital encounter of 07/10/22 (from the past 48 hour(s))  CBC     Status: Abnormal   Collection Time: 07/13/22  5:39 AM  Result Value Ref Range   WBC 11.7 (H) 4.0 - 10.5 K/uL   RBC 4.24 4.22 - 5.81 MIL/uL   Hemoglobin 11.4 (L) 13.0 - 17.0 g/dL   HCT 36.2 (L) 39.0 - 52.0 %   MCV 85.4 80.0 - 100.0 fL   MCH 26.9 26.0 - 34.0 pg   MCHC 31.5 30.0 - 36.0 g/dL   RDW 13.6 11.5 - 15.5 %   Platelets 205 150 - 400 K/uL   nRBC 0.0 0.0 - 0.2 %    Comment: Performed at [facility], [address] [place],  [address]    Current Facility-Administered Medications   Medication Dose Route Frequency Provider Last Rate Last Admin   0.9 %  sodium chloride infusion   Intravenous Continuous Herbert Pun, MD 100 mL/hr at 07/14/22 1113 Restarted at 07/14/22 1113   acetaminophen (TYLENOL) tablet 650 mg  650 mg Oral Q6H PRN Herbert Pun, MD       Or   acetaminophen (TYLENOL) suppository 650 mg  650 mg Rectal Q6H PRN Herbert Pun, MD       albuterol (PROVENTIL) (2.5 MG/3ML) 0.083% nebulizer solution 2.5 mg  2.5 mg Nebulization Q6H PRN Herbert Pun, MD   2.5 mg at 07/14/22 0828   benztropine (COGENTIN) tablet 0.5 mg  0.5 mg Oral BID Herbert Pun, MD   0.5 mg at 07/13/22 2111   Chlorhexidine Gluconate Cloth 2 % PADS 6 each  6 each Topical Q0600 Lorella Nimrod, MD   6 each at 07/14/22 [phone]  cholecalciferol (VITAMIN D3) 25 MCG (1000 UNIT) tablet 2,000 Units  2,000 Units Oral Daily Herbert Pun, MD   2,000 Units at 07/14/22 1038   cloZAPine (CLOZARIL) tablet 50 mg  50 mg Oral QHS ,  T, MD   50 mg at 07/13/22 2112   docusate sodium (COLACE) capsule 200 mg  200 mg Oral BID Herbert Pun, MD   200 mg at 07/14/22 1038   enoxaparin (LOVENOX) injection 52.5 mg  0.5 mg/kg Subcutaneous Q24H Cintron-Diaz, Reeves Forth, MD   52.5 mg at 07/13/22 1027   ferrous sulfate tablet 325 mg  325 mg Oral Q1200 Herbert Pun, MD   325 mg at 07/13/22 1244   guaiFENesin-dextromethorphan (ROBITUSSIN DM) 100-10 MG/5ML syrup 5 mL  5 mL Oral Q4H PRN Lorella Nimrod, MD   5 mL at 07/14/22 0828   hydrOXYzine (ATARAX) tablet 50 mg  50 mg Oral Q6H PRN Herbert Pun, MD   50 mg at 07/14/22 [id number]   levothyroxine (SYNTHROID) tablet 25 mcg  25 mcg Oral QAC breakfast Lorella Nimrod, MD   25 mcg at 07/14/22 0530   lithium carbonate (ESKALITH) ER tablet 900 mg  900 mg Oral QHS Herbert Pun, MD   900 mg at 07/13/22 2112   lithium carbonate capsule 300 mg  300 mg Oral BID Herbert Pun, MD   300 mg at 07/13/22 1026    loratadine (CLARITIN) tablet 10 mg  10 mg Oral Daily Lorella Nimrod, MD   10 mg at 07/14/22 1038   magnesium hydroxide (MILK OF MAGNESIA) suspension 30 mL  30 mL Oral Daily PRN Lorella Nimrod, MD       morphine (PF) 4 MG/ML injection 4 mg  4 mg Intravenous Q4H PRN Herbert Pun, MD   4 mg at 07/14/22 0846   OLANZapine zydis (ZYPREXA) disintegrating tablet 20 mg  20 mg Oral QHS Herbert Pun, MD   20 mg at 07/13/22 2113   ondansetron (ZOFRAN) tablet 4 mg  4 mg Oral Q6H PRN Herbert Pun, MD       Or   ondansetron (ZOFRAN) injection 4 mg  4 mg Intravenous Q6H PRN Herbert Pun, MD       Oral care mouth rinse  15 mL Mouth Rinse PRN Lorella Nimrod, MD       oxybutynin (DITROPAN-XL) 24 hr tablet 10 mg  10 mg Oral QHS Herbert Pun, MD   10 mg at 07/13/22 2111   pantoprazole (PROTONIX) EC tablet 40 mg  40 mg Oral Daily Herbert Pun, MD   40 mg at 07/14/22 1038   polyethylene glycol (MIRALAX / GLYCOLAX) packet 17 g  17 g Oral Daily Herbert Pun, MD       rosuvastatin (CRESTOR) tablet 20 mg  20 mg Oral QHS Lorella Nimrod, MD   20 mg at 07/13/22 2111   sertraline (ZOLOFT) tablet 100 mg  100 mg Oral Daily Herbert Pun, MD   100 mg at 07/13/22 1027   sodium chloride (OCEAN) 0.65 % nasal spray 1 spray  1 spray Each Nare PRN Lorella Nimrod, MD       traZODone (DESYREL) tablet 100 mg  100 mg Oral QHS Herbert Pun, MD   100 mg at 07/13/22 2110   traZODone (DESYREL) tablet 25 mg  25 mg Oral QHS PRN Herbert Pun, MD       ziprasidone (GEODON) injection 10 mg  10 mg Intramuscular Q6H PRN Herbert Pun, MD        Musculoskeletal: Strength & Muscle Tone: within normal limits  Gait & Station: normal Patient leans: N/A            Psychiatric Specialty Exam:  Presentation  General Appearance:  Appropriate for Environment  Eye Contact: Good  Speech: Clear and Coherent  Speech Volume: Normal  Handedness:No  data recorded  Mood and Affect  Mood: Euthymic  Affect: Appropriate   Thought Process  Thought Processes: Other (comment) (at baseline)  Descriptions of Associations:Loose  Orientation:Partial  Thought Content:-- (at baseline)  History of Schizophrenia/Schizoaffective disorder:No data recorded Duration of Psychotic Symptoms:Greater than six months  Hallucinations:No data recorded Ideas of Reference:-- (denies)  Suicidal Thoughts:No data recorded Homicidal Thoughts:No data recorded  Sensorium  Memory: Immediate Fair  Judgment: Poor  Insight: Lacking   Executive Functions  Concentration: Poor  Attention Span: Fair  Recall: [employer] of Knowledge: Fair  Language: Fair   Psychomotor Activity  Psychomotor Activity:No data recorded  Assets  Assets: Housing; Catering manager; Social Support; Resilience; Physical Health   Sleep  Sleep:No data recorded  Physical Exam: Physical Exam Vitals and nursing note reviewed.  Constitutional:      Appearance: Normal appearance.  HENT:     Head: Normocephalic and atraumatic.     Mouth/Throat:     Pharynx: Oropharynx is clear.  Eyes:     Pupils: Pupils are equal, round, and reactive to light.  Cardiovascular:     Rate and Rhythm: Normal rate and regular rhythm.  Pulmonary:     Effort: Pulmonary effort is normal.     Breath sounds: Normal breath sounds.  Abdominal:     General: Abdomen is flat.     Palpations: Abdomen is soft.  Musculoskeletal:        General: Normal range of motion.  Skin:    General: Skin is warm and dry.  Neurological:     General: No focal deficit present.     Mental Status: He is alert. Mental status is at baseline.  Psychiatric:        Attention and Perception: Attention normal.        Mood and Affect: Mood normal.        Speech: Speech normal.        Behavior: Behavior is cooperative.        Thought Content: Thought content normal.        Cognition and  Memory: Cognition is impaired. Memory is impaired.    Review of Systems  Constitutional: Negative.   HENT: Negative.    Eyes: Negative.   Respiratory: Negative.    Cardiovascular: Negative.   Gastrointestinal:  Positive for abdominal pain.  Musculoskeletal: Negative.   Skin: Negative.   Neurological: Negative.   Psychiatric/Behavioral:  Positive for memory loss. Negative for depression, hallucinations, substance abuse and suicidal ideas.    Blood pressure 139/87, pulse 84, temperature 98.9 F (37.2 C), temperature source Oral, resp. rate 17, height 5' 10"$  (1.778 m), weight 116.2 kg, SpO2 97 %. Body mass index is 36.76 kg/m.  Treatment Plan Summary: Medication management and Plan no change at the moment to medication.  Patient has an outpatient provider.  At this point I believe that he has returned to his usual baseline and would not benefit from inpatient psychiatric treatment.  He has a safe group home to return to.  Patient was counseled about the dangers of swallowing inanimate objects especially things like batteries and it was pointed out to them how this just leads to pain for him.  Strongly encouraged him to try to refrain from  that and let people know if he is feeling more impulsive.  He is not on involuntary commitment.  Do not recommend admission to psychiatric unit he can be discharged back to his usual outpatient treatment plan.  Disposition: Patient does not meet criteria for psychiatric inpatient admission.  Alethia Berthold, MD 07/14/2022 3:41 PM

## 2022-07-14 NOTE — Progress Notes (Signed)
Patient ID: Lawrence Morgan, male   DOB: 13-Sep-1980, 41 y.o.   MRN: [id number]  Addendum to my previous note:  CT scan of the abdomen with oral contras shows not free air or contrast leak. This confirm again that there is no complication from surgery. There is no surgical or medical explanation of why patient does not want to eat. Continue soft diet. No surgical contraindication for behavioral management.   Herbert Pun, MD FACS

## 2022-07-14 NOTE — Progress Notes (Signed)
Patient ID: Lawrence Morgan, male   DOB: 1980/12/26, 42 y.o.   MRN: [id number]     [facility] Day(s): 3.   Interval History: Patient seen and examined, no acute events or new complaints overnight. Patient reports having severe abdominal pain. He endorses that he is not able to eat due to the abdominal pain. He did question about "all the meds that we have put him in".   Vital signs in last 24 hours: [min-max] current  Temp:  [98.2 F (36.8 C)-100 F (37.8 C)] 99.1 F (37.3 C) (02/14 0500) Pulse Rate:  [84-100] 88 (02/14 0600) Resp:  [19-41] 25 (02/14 0600) BP: (127-155)/(79-103) 146/96 (02/14 0600) SpO2:  [94 %-99 %] 95 % (02/14 0600)     Height: 5' 10"$  (177.8 cm) Weight: 116.2 kg BMI (Calculated): 36.76   Physical Exam:  Constitutional: alert, cooperative and no distress  Respiratory: breathing non-labored at rest  Cardiovascular: regular rate and sinus rhythm  Gastrointestinal: soft, tender in the surgical area, and non-distended  Labs:     Latest Ref Rng & Units 07/13/2022    5:39 AM 07/12/2022   11:09 AM 07/11/2022    4:36 AM  CBC  WBC 4.0 - 10.5 K/uL 11.7  17.7    17.7  8.9   Hemoglobin 13.0 - 17.0 g/dL 11.4  12.0    12.1  11.7   Hematocrit 39.0 - 52.0 % 36.2  38.4    38.2  37.9   Platelets 150 - 400 K/uL 205  213    209  210       Latest Ref Rng & Units 07/12/2022   11:09 AM 07/11/2022    4:36 AM 07/10/2022   11:34 PM  CMP  Glucose 70 - 99 mg/dL 107  113  93   BUN 6 - 20 mg/dL 11  10  11   $ Creatinine 0.61 - 1.24 mg/dL 0.97  0.90  1.01   Sodium 135 - 145 mmol/L 138  137  138   Potassium 3.5 - 5.1 mmol/L 3.8  3.8  3.7   Chloride 98 - 111 mmol/L 109  108  107   CO2 22 - 32 mmol/L 22  25  24   $ Calcium 8.9 - 10.3 mg/dL 8.9  8.9  9.5   Total Protein 6.5 - 8.1 g/dL   7.6   Total Bilirubin 0.3 - 1.2 mg/dL   0.6   Alkaline Phos 38 - 126 U/L   78   AST 15 - 41 U/L   24   ALT 0 - 44 U/L   24     Imaging studies: No new pertinent imaging  studies   Assessment/Plan:  42 y.o. male with gastric foreign object 3 Day Post-Op s/p gastrotomy with removal of foreign object, complicated by pertinent comorbidities including schizoaffective disorder.    -Again complaining of severe pain and not eating. Due to his mental disorder, I am unable to reliable assess if his pain is out of proportion.  -Will order CT scan of the abdomen to rule out any leak from gastric repair. If CT scan is negaitve, then definitely is not as patient is describing to the point of not eating.  As per nurse note, patient refused lithium. Not being complaint with psychiatric treatment make any assessment more difficult.   Arnold Long, MD

## 2022-07-14 NOTE — Progress Notes (Signed)
ACT Team   [phone] Main office Contact for patient: Lawrence Morgan [phone]

## 2022-07-14 NOTE — Progress Notes (Signed)
Progress Note   Patient: Lawrence Morgan [id number] DOB: 10-23-1980 DOA: 07/10/2022     3 DOS: the patient was seen and examined on 07/14/2022   Brief hospital course: Taken from H&P.  [name] is a 42 y.o. male with medical history significant for schizoaffective disorder bipolar type, PTSD, marijuana abuse and intellectual disability, who presented to the emergency room from his group home with his assistant with acute onset of abdominal pain after ingesting 4-9V batteries 3 days ago.  He he apparently also swallowed 3 screws.  Patient also endorsed suicidal thoughts.  ED Course: When he came to the ER vital signs were within normal.  Labs revealed normal CMP and CBC showed no leukocytosis of 11.9.  Tylenol level was less than 10 and salicylate less than 7.  His influenza COVID-19 and RSV PCR came back negative   EKG as reviewed by me :  EKG showed normal sinus rhythm with a rate of 85 Imaging: 1 abdomen x-ray revealed the following: 1. Total of 3 screws overlying the right lower abdomen are noted (measuring approximately 4 cm). 2. Total of 3 rectangular batteries (measuring approximately 6.5 x 3.5 cm) overlying the mid abdomen. 3. Nonobstructive bowel gas pattern. 4. No findings to suggest pneumoperitoneum/bowel perforation with markedly limited evaluation on this supine radiograph. Recommend CT abdomen/pelvis with intravenous contrast for further evaluation.  Abdominal and pelvic CT scan without contrast revealed the following: 1. Three 5 x 2.5 cm rectangular batteries within the gastric lumen. 2. Three 3 cm screws within the cecal lumen. 3. No bowel perforation. 4. Bilateral trace pleural effusions.   Dr. Marius Ditch was contacted about the patient and will plan for EGD in a.m.   Psych was also consulted and patient will be transferred to behavioral health after medical clearance.  2/11: Vitals and labs stable.  Leukocytosis resolved.  EGD did show 3 9V batteries in the stomach,  unsuccessful attempt to remove them.  Please see the report.  General surgery was consulted for surgical removal.  Also found to have a small gastric ulcer with clean base.  2/12: Mildly elevated blood pressure at 143/90.  He was emergently taken to the OR after unsuccessful EGD due to increased risk of perforation s/p gastrostomy with removal of 3 9 V batteries.  Out of proportion pain this morning so surgical team ordered upper GI series to rule out any leakage from gastrostomy lesion. 2 separate notes from psych, initial one saying that he will go to behavioral health due to suicidal intention after medical clearance and treatment, followed by a second note saying that he is not a threat and does not need inpatient psych.  This is his second incidence and he was very clear initially that he did swallowed those things with intention to harm himself. Patient has a very extensive psych history.  Message sent to Dr. Weber Cooks for clarification, according to his secure chat patient has an habit of swallowing different objects due to his underlying psych illness.  No concern of suicide. CBC with worsening leukocytosis, most likely reactive with surgery.  Hemoglobin stable.  2/13: Mildly elevated blood pressure at 153/84, upper GI series done yesterday was limited as patient was unable to tolerate contrast and difficulty in positioning but did not show any obvious gastric perforation along the fundus.  Today pain is much improved and tolerating soft diet.  Labs with improving leukocytosis.  Surgery cleared him for discharge. PT ordered and message sent to psych as he will be transferred to behavioral  health.  2/14: Hemodynamically stable.  CT abdomen was repeated by general surgery today which was negative for any leak or perforation.  Did show 3 screws in the ascending colon.  Patient did not had any bowel movement yet and refusing to take MiraLAX.  Per general surgery he is ready to be transferred to  behavioral health.  Another message sent to Dr. Weber Cooks.   Assessment and Plan: * Foreign body ingestion, initial encounter Per psych this is not a suicidal attempt as patient has an habit of swallowing unusual objects.  S/p gastrotomy with removal of 3 9V batteries from his stomach by general surgery after a failed attempt via EGD.  Now tolerating soft diet, no bowel movements yet.  Upper GI series with difficult study but did not show any leak. CT abdomen today with no anastomosis leak or perforation, did show 3 screws in ascending colon. -Surgery cleared him for discharge. -Will be going to behavioral health -PT recommending home health -Encourage ambulation  Dyslipidemia - We will continue statin therapy.  Hypothyroidism - We will continue Synthroid.  Schizoaffective disorder, bipolar type ([place]) - We will continue his psych  medications. - Psychiatry consult , patient will be going to behavioral health once bed is available -General surgery cleared him for discharge  GERD without esophagitis - We will continue his PPI therapy.  Suicide attempt [facility]) Was evaluated by psychiatry, will be going to behavioral health.  Depression-resolved as of 07/11/2022 - We will continue his Zoloft and trazodone as well as his clozapine with Cogentin..   Subjective: Patient was lying down comfortably when seen today.  Stating that whenever he ate it hurts but he was able to tolerate ice cream well.  No bowel movement yet  Physical Exam: Vitals:   07/14/22 0757 07/14/22 0800 07/14/22 1000 07/14/22 1215  BP:  (!) 144/86 (!) 145/85 139/87  Pulse:  84 87 84  Resp:  (!) 24 (!) 28 17  Temp: 98.8 F (37.1 C)   98.9 F (37.2 C)  TempSrc: Oral   Oral  SpO2:  96% 93% 97%  Weight:      Height:       General.  Obese gentleman, in no acute distress. Pulmonary.  Lungs clear bilaterally, normal respiratory effort. CV.  Regular rate and rhythm, no JVD, rub or murmur. Abdomen.  Soft, nontender,  nondistended, BS positive. CNS.  Alert and oriented .  No focal neurologic deficit. Extremities.  No edema, no cyanosis, pulses intact and symmetrical. Psychiatry.  Judgment and insight appears impaired.   Data Reviewed: Prior data reviewed  Family Communication: Called father on phone  Disposition: Status is: Inpatient   Planned Discharge Destination: To be determined Time spent:  42 minutes  This record has been created using Systems analyst. Errors have been sought and corrected,but may not always be located. Such creation errors do not reflect on the standard of care.   Author: Lorella Nimrod, MD 07/14/2022 3:32 PM  For on call review [email or web address].

## 2022-07-14 NOTE — Progress Notes (Signed)
Occupational Therapy Treatment Patient Details Name: Lawrence Morgan MRN: [id number] DOB: 1980/10/20 Today's Date: 07/14/2022   History of present illness 42 y.o. male with medical history significant for schizoaffective disorder bipolar type, PTSD, marijuana abuse and intellectual disability, who presented to the emergency room from his group home with his assistant with acute onset of abdominal pain after ingesting 4-9V batteries 3 days ago.  He he apparently also swallowed 3 screws.   OT comments  Patient received semi-reclined in bed and agreeable to OT. Pt endorsed needing to use urinal. He completed bed mobility INDly, stood from EOB with Mod I, and required set up-supervision for use of urinal in standing. Once back at EOB, pt engaged in UB dressing with Min A 2/2 managing lines/leads. Pt left at received with all needs in reach. Pt is making progress toward goal completion. D/C recommendation remains appropriate. OT will continue to follow acutely.    Recommendations for follow up therapy are one component of a multi-disciplinary discharge planning process, led by the attending physician.  Recommendations may be updated based on patient status, additional functional criteria and insurance authorization.    Follow Up Recommendations  No OT follow up     Assistance Recommended at Discharge Intermittent Supervision/Assistance  Patient can return home with the following  A little help with bathing/dressing/bathroom;A little help with walking and/or transfers;Help with stairs or ramp for entrance;Assist for transportation;Assistance with cooking/housework   Equipment Recommendations  None recommended by OT    Recommendations for Other Services      Precautions / Restrictions Precautions Precautions: Fall Restrictions Weight Bearing Restrictions: No       Mobility Bed Mobility Overal bed mobility: Independent Bed Mobility: Supine to Sit, Sit to Supine                 Transfers Overall transfer level: Modified independent Equipment used: Rolling walker (2 wheels) Transfers: Sit to/from Stand                   Balance Overall balance assessment: Needs assistance Sitting-balance support: Feet supported Sitting balance-Leahy Scale: Good     Standing balance support: Bilateral upper extremity supported, No upper extremity supported, During functional activity Standing balance-Leahy Scale: Good                             ADL either performed or assessed with clinical judgement   ADL Overall ADL's : Needs assistance/impaired     Grooming: Set up;Supervision/safety;Sitting           Upper Body Dressing : Sitting;Minimal assistance Upper Body Dressing Details (indicate cue type and reason): to don/doff gown, assist to manage lines/leads                 Functional mobility during ADLs: Supervision/safety;Rolling walker (2 wheels) (to take ~4 lateral steps at EOB) General ADL Comments: use of urinal in standing with set up-supervision    Extremity/Trunk Assessment Upper Extremity Assessment Upper Extremity Assessment: Overall WFL for tasks assessed   Lower Extremity Assessment Lower Extremity Assessment: Overall WFL for tasks assessed        Vision Patient Visual Report: No change from baseline     Perception     Praxis      Cognition Arousal/Alertness: Awake/alert Behavior During Therapy: WFL for tasks assessed/performed Overall Cognitive Status: History of cognitive impairments - at baseline  General Comments: pleasant and agreeable to session        Exercises      Shoulder Instructions       General Comments      Pertinent Vitals/ Pain       Pain Assessment Pain Assessment: Faces Faces Pain Scale: Hurts little more Pain Location: abdomen Pain Descriptors / Indicators: Aching, Grimacing, Discomfort Pain Intervention(s): Limited activity  within patient's tolerance, Monitored during session, Repositioned  Home Living                                          Prior Functioning/Environment              Frequency  Min 2X/week        Progress Toward Goals  OT Goals(current goals can now be found in the care plan section)  Progress towards OT goals: Progressing toward goals  Acute Rehab OT Goals Patient Stated Goal: to decrease pain OT Goal Formulation: With patient Time For Goal Achievement: 07/27/22 Potential to Achieve Goals: Good  Plan Discharge plan remains appropriate;Frequency remains appropriate    Co-evaluation                 AM-PAC OT "6 Clicks" Daily Activity     Outcome Measure   Help from another person eating meals?: None Help from another person taking care of personal grooming?: None Help from another person toileting, which includes using toliet, bedpan, or urinal?: A Little Help from another person bathing (including washing, rinsing, drying)?: A Little Help from another person to put on and taking off regular upper body clothing?: A Little Help from another person to put on and taking off regular lower body clothing?: A Little 6 Click Score: 20    End of Session Equipment Utilized During Treatment: Rolling walker (2 wheels)  OT Visit Diagnosis: Unsteadiness on feet (R26.81);Repeated falls (R29.6);Muscle weakness (generalized) (M62.81)   Activity Tolerance Patient tolerated treatment well   Patient Left in bed;with call bell/phone within reach;with bed alarm set   Nurse Communication Mobility status        Time: 1550-1602 OT Time Calculation (min): 12 min  Charges: OT General Charges $OT Visit: 1 Visit OT Treatments $Self Care/Home Management : 8-22 mins  [facility] MS, OTR/L ascom [phone]  07/14/22, 5:37 PM

## 2022-07-14 NOTE — Progress Notes (Signed)
Patient complained of increased WOB, SOB, cough and congestion at beginning of shift. RN contacted MD to order chest xray if deemed appropriate. Chest xray ordered showing probable atelectasis. Patient provided Robitussin for coughing and Atarax for anxiety. Oxygen saturation increased and respiratory rate decreased to appropriate level. Order placed for Abd CT and patient provided with and encouraged to drink contrast. Patient believed contrast was laxative and education provided by RN could not dissuade patient otherwise. Transport moved patient from ICU to CT and back, uneventful. Patient refused all psych medications ordered and believes these are not correct and verbalizes desire to take other psych medications but unable to provide names for those medications at this time.  Patient verbalizes desire to move to Psych unit when medically cleared from ICU. MD Reesa Chew made aware and reported to RN that psych MD has cleared patient from requiring in patient psych treatment at this time. Patient provided with urinal at bedside. Calls for RN when needs assistance appropriately. Patient verbalizes appreciation for care he has received from care team. TOC to be contacted by MD as patient request to not return to [facility].

## 2022-07-14 NOTE — Progress Notes (Signed)
Patient transported to CT with transporter. Uneventful. Patient in room when RN returned to ICU from transporting another patient to scan. Vitals WDL , call bell within reach, all needs met at this time. Will continue to monitor closely.

## 2022-07-14 NOTE — Progress Notes (Signed)
   07/14/22 2100  Spiritual Encounters  Type of Visit Initial  Care provided to: Patient  Referral source Nurse (RN/NT/LPN)  Reason for visit Urgent spiritual support  OnCall Visit Yes  Spiritual Framework  Presenting Themes Significant life change;Values and beliefs  Community/Connection Family  Patient Stress Factors Financial concerns;Family relationships;Lack of knowledge;Major life changes  Interventions  Spiritual Care Interventions Made Established relationship of care and support;Compassionate presence;Reflective listening;Normalization of emotions;Explored ethical dilemma;Encouragement  Intervention Outcomes  Outcomes Reduced anxiety;Awareness of support  Spiritual Care Plan  Spiritual Care Issues Still Outstanding Chaplain will continue to follow   Patient is dealing with stresses of life and unstable mentally with some of his questions and things he is willing to do. Patient was requesting me to do things that were beyond my scope of practices and unreasonable demands. Told patients he need to come up with solutions that can help improve his surroundings and life. That doesn't require him to hurt himself or endanger others.

## 2022-07-14 NOTE — Progress Notes (Signed)
Physical Therapy Evaluation Patient Details Name: Lawrence Morgan MRN: [id number] DOB: 1980-07-14 Today's Date: 07/14/2022  History of Present Illness  42 y.o. male with medical history significant for schizoaffective disorder bipolar type, PTSD, marijuana abuse and intellectual disability, who presented to the emergency room from his group home with his assistant with acute onset of abdominal pain after ingesting 4-9V batteries 3 days ago.  He he apparently also swallowed 3 screws.  Clinical Impression  Pt is a pleasant 42 year old male who was admitted for foreign body removal. Pt performs bed mobility with indep, transfers with mod I, and ambulation with supervision and RW. Pt demonstrates deficits with endurance/mobility. Would benefit from using RW at this time, anticipate some hospital acquired weakness. Very pleasant and willing to ambulate. Would benefit from skilled PT to address above deficits and promote optimal return to PLOF. Recommend transition to [facility] upon discharge from acute hospitalization.      Recommendations for follow up therapy are one component of a multi-disciplinary discharge planning process, led by the attending physician.  Recommendations may be updated based on patient status, additional functional criteria and insurance authorization.  Follow Up Recommendations Home health PT      Assistance Recommended at Discharge PRN  Patient can return home with the following  A little help with walking and/or transfers;Help with stairs or ramp for entrance    Equipment Recommendations Rolling walker (2 wheels)  Recommendations for Other Services       Functional Status Assessment Patient has had a recent decline in their functional status and demonstrates the ability to make significant improvements in function in a reasonable and predictable amount of time.     Precautions / Restrictions Precautions Precautions: Fall Restrictions Weight Bearing Restrictions: No       Mobility  Bed Mobility Overal bed mobility: Independent       Supine to sit: Independent     General bed mobility comments: safe technique with upright posture. Once seated, upright posture.    Transfers Overall transfer level: Modified independent Equipment used: Rolling walker (2 wheels) Transfers: Sit to/from Stand Sit to Stand: Modified independent (Device/Increase time)           General transfer comment: safe technique with upright posture and mod I. RW used    Ambulation/Gait Ambulation/Gait assistance: Supervision Gait Distance (Feet): 300 Feet Assistive device: Rolling walker (2 wheels) Gait Pattern/deviations: Step-through pattern       General Gait Details: ambulated around RN station with RW and supervision. Reciprocal gait pattern performed  Stairs            Wheelchair Mobility    Modified Rankin (Stroke Patients Only)       Balance Overall balance assessment: Needs assistance Sitting-balance support: Feet supported Sitting balance-Leahy Scale: Good     Standing balance support: During functional activity, Bilateral upper extremity supported, Reliant on assistive device for balance Standing balance-Leahy Scale: Good                               Pertinent Vitals/Pain Pain Assessment Pain Assessment: Faces Faces Pain Scale: Hurts a little bit Pain Location: abdomen Pain Descriptors / Indicators: Aching, Grimacing, Discomfort Pain Intervention(s): Limited activity within patient's tolerance, Repositioned    Home Living Family/patient expects to be discharged to:: Group home                   Additional Comments: Per chart review, pt lives  at [facility] group home    Prior Function Prior Level of Function : Independent/Modified Independent             Mobility Comments: reports no falls ADLs Comments: Pt reports being Ind in self care and mobility. He has chores at group home such as  sweeping and taking the trash out.     Hand Dominance        Extremity/Trunk Assessment   Upper Extremity Assessment Upper Extremity Assessment: Overall WFL for tasks assessed    Lower Extremity Assessment Lower Extremity Assessment: Overall WFL for tasks assessed       Communication   Communication: No difficulties  Cognition Arousal/Alertness: Awake/alert Behavior During Therapy: WFL for tasks assessed/performed Overall Cognitive Status: History of cognitive impairments - at baseline                                 General Comments: pleasant and agreeable to session        General Comments      Exercises     Assessment/Plan    PT Assessment Patient needs continued PT services  PT Problem List Decreased mobility;Pain       PT Treatment Interventions Gait training;DME instruction;Balance training    PT Goals (Current goals can be found in the Care Plan section)  Acute Rehab PT Goals Patient Stated Goal: to get stronger PT Goal Formulation: With patient Time For Goal Achievement: 07/28/22 Potential to Achieve Goals: Good    Frequency Min 2X/week     Co-evaluation               AM-PAC PT "6 Clicks" Mobility  Outcome Measure Help needed turning from your back to your side while in a flat bed without using bedrails?: None Help needed moving from lying on your back to sitting on the side of a flat bed without using bedrails?: None Help needed moving to and from a bed to a chair (including a wheelchair)?: A Little Help needed standing up from a chair using your arms (e.g., wheelchair or bedside chair)?: A Little Help needed to walk in hospital room?: A Little Help needed climbing 3-5 steps with a railing? : A Little 6 Click Score: 20    End of Session Equipment Utilized During Treatment: Gait belt Activity Tolerance: Patient tolerated treatment well Patient left: in chair Nurse Communication: Mobility status PT Visit Diagnosis:  Muscle weakness (generalized) (M62.81);Pain Pain - Right/Left:  (abdominal) Pain - part of body:  (abdominal)    Time: [id number] PT Time Calculation (min) (ACUTE ONLY): 15 min   Charges:   PT Evaluation $PT Eval Low Complexity: 1 Low PT Treatments $Gait Training: 8-22 mins        Greggory Stallion, PT, DPT, GCS [phone]   , 07/14/2022, 12:48 PM

## 2022-07-15 DIAGNOSIS — T189XXA Foreign body of alimentary tract, part unspecified, initial encounter: Secondary | ICD-10-CM | POA: Diagnosis not present

## 2022-07-15 DIAGNOSIS — K219 Gastro-esophageal reflux disease without esophagitis: Secondary | ICD-10-CM | POA: Diagnosis not present

## 2022-07-15 DIAGNOSIS — F250 Schizoaffective disorder, bipolar type: Secondary | ICD-10-CM | POA: Diagnosis not present

## 2022-07-15 DIAGNOSIS — E039 Hypothyroidism, unspecified: Secondary | ICD-10-CM | POA: Diagnosis not present

## 2022-07-15 MED ORDER — HYDROXYZINE HCL 50 MG PO TABS: 50.0000 mg | ORAL_TABLET | Freq: Four times a day (QID) | ORAL | 1 refills | Status: AC | PRN

## 2022-07-15 MED ORDER — LORAZEPAM 2 MG/ML IJ SOLN: 1.0000 mg | INTRAMUSCULAR | Status: DC

## 2022-07-15 MED ORDER — HYDROXYZINE HCL 50 MG PO TABS: 50.0000 mg | ORAL_TABLET | Freq: Four times a day (QID) | ORAL | 1 refills | Status: DC | PRN

## 2022-07-15 MED ORDER — ACETAMINOPHEN 325 MG PO TABS: 650.0000 mg | ORAL_TABLET | Freq: Four times a day (QID) | ORAL | Status: DC | PRN

## 2022-07-15 MED ORDER — ALBUTEROL SULFATE HFA 108 (90 BASE) MCG/ACT IN AERS: 2.0000 | INHALATION_SPRAY | Freq: Four times a day (QID) | RESPIRATORY_TRACT | 2 refills | Status: AC | PRN

## 2022-07-15 MED ORDER — ACETAMINOPHEN 325 MG PO TABS: 650.0000 mg | ORAL_TABLET | Freq: Four times a day (QID) | ORAL | 1 refills | Status: AC | PRN

## 2022-07-15 NOTE — Progress Notes (Signed)
Patient ID: Lawrence Morgan, male   DOB: 1980-11-02, 42 y.o.   MRN: [id number]     [facility] Hospital Day(s): 4.   Interval History: Patient seen and examined, no acute events or new complaints overnight. Patient reports feeling better.  Today stopped complaining about abdominal pain as he was complaining days before.  He tolerated diet.  Patient is upset because he was told that he was not going to be transferred to behavioral ward.  Vital signs in last 24 hours: [min-max] current  Temp:  [98.1 F (36.7 C)-98.9 F (37.2 C)] 98.7 F (37.1 C) (02/15 0900) Pulse Rate:  [84-95] 92 (02/15 0900) Resp:  [17-35] 26 (02/15 0900) BP: (106-146)/(63-94) 118/94 (02/15 0900) SpO2:  [91 %-98 %] 91 % (02/15 0900)     Height: 5' 10"$  (177.8 cm) Weight: 116.2 kg BMI (Calculated): 36.76   Physical Exam:  Constitutional: alert, cooperative and no distress  Respiratory: breathing non-labored at rest  Cardiovascular: regular rate and sinus rhythm  Gastrointestinal: soft, non-tender, and non-distended  Labs:     Latest Ref Rng & Units 07/13/2022    5:39 AM 07/12/2022   11:09 AM 07/11/2022    4:36 AM  CBC  WBC 4.0 - 10.5 K/uL 11.7  17.7    17.7  8.9   Hemoglobin 13.0 - 17.0 g/dL 11.4  12.0    12.1  11.7   Hematocrit 39.0 - 52.0 % 36.2  38.4    38.2  37.9   Platelets 150 - 400 K/uL 205  213    209  210       Latest Ref Rng & Units 07/12/2022   11:09 AM 07/11/2022    4:36 AM 07/10/2022   11:34 PM  CMP  Glucose 70 - 99 mg/dL 107  113  93   BUN 6 - 20 mg/dL 11  10  11   $ Creatinine 0.61 - 1.24 mg/dL 0.97  0.90  1.01   Sodium 135 - 145 mmol/L 138  137  138   Potassium 3.5 - 5.1 mmol/L 3.8  3.8  3.7   Chloride 98 - 111 mmol/L 109  108  107   CO2 22 - 32 mmol/L 22  25  24   $ Calcium 8.9 - 10.3 mg/dL 8.9  8.9  9.5   Total Protein 6.5 - 8.1 g/dL   7.6   Total Bilirubin 0.3 - 1.2 mg/dL   0.6   Alkaline Phos 38 - 126 U/L   78   AST 15 - 41 U/L   24   ALT 0 - 44 U/L   24     Imaging  studies: No new pertinent imaging studies   Assessment/Plan:  42 y.o. male with gastric foreign object 4 Day Post-Op s/p gastrotomy with removal of foreign object, complicated by pertinent comorbidities including schizoaffective disorder.    -Patient doing much better today. -Tolerating diet -Patient can be discharged from my standpoint -Patient upset because he is secondary transfer to behavioral ward.  He endorses that he wanted examination.  I discussed with patient that I do not make that decision.  Arnold Long, MD  '

## 2022-07-15 NOTE — TOC Transition Note (Signed)
Transition of Care [facility]) - CM/SW Discharge Note   Patient Details  Name: Lawrence Morgan MRN: [id number] Date of Birth: February 21, 1981  [facility] CM/SW Contact:  Candie Chroman, LCSW Phone Number: 07/15/2022, 4:04 PM   Clinical Narrative:   Patient has orders to discharge back to [facility] today. FL2 an discharge summary faxed to facility. Ordered RW through [employer] but dad has to sign for it before they can deliver. They will deliver to the facility once signed. Dad said he will have to coordinate outpatient PT with the facility. Put clinic list in discharge packet so they can facilitate referral with PCP. Group home staff will transport patient back to the facility. RN has already called report. No further concerns. CSW signing off.  Final next level of care: Group Home Barriers to Discharge: Barriers Resolved   Patient Goals and CMS Choice      Discharge Placement                  Patient to be transferred to facility by: Group home staff Name of family member notified: Rob Hickman Patient and family notified of of transfer: 07/15/22  Discharge Plan and Services Additional resources added to the After Visit Summary for     Discharge Planning Services: CM Consult            DME Arranged: Gilford Rile rolling DME Agency: [employer] Date DME Agency Contacted: 07/15/22   Representative spoke with at DME Agency: Suanne Marker [facility] Arranged: NA          Social Determinants of Health ([place]) Interventions SDOH Screenings   Food Insecurity: Food Insecurity Present (07/12/2022)  Housing: Medium Risk (07/12/2022)  Transportation Needs: No Transportation Needs (07/12/2022)  Utilities: Not At Risk (07/12/2022)  Alcohol Screen: Low Risk  (01/21/2021)  Tobacco Use: High Risk (07/12/2022)     Readmission Risk Interventions     No data to display

## 2022-07-15 NOTE — Progress Notes (Signed)
Occupational Therapy Treatment Patient Details Name: Lawrence Morgan MRN: [id number] DOB: [birth year] Today's Date: 07/15/2022   History of present illness 42 y.o. male with medical history significant for schizoaffective disorder bipolar type, PTSD, marijuana abuse and intellectual disability, who presented to the emergency room from his group home with his assistant with acute onset of abdominal pain after ingesting 4-9V batteries 3 days ago.  He he apparently also swallowed 3 screws.   OT comments  Upon entering the room, pt supine in bed and agreeable to OT intervention. Pt reports urgency to urinate and transfers himself to bathroom without assistance for toileting needs. Pt with several patient belonging bags on the floor he picked up and OT cautioned him secondary to abdominal incision. Pt able to obtain needed items and takes them to bathroom. OT educated on use of figure four position for pain management with LB dressing. Pt seated on commode for LB dressing without assistance and dons pull over shirt independently. Pt returns to recliner chair at end of session. He requests drink and [place] ice. Telesitter still present and monitoring.    Recommendations for follow up therapy are one component of a multi-disciplinary discharge planning process, led by the attending physician.  Recommendations may be updated based on patient status, additional functional criteria and insurance authorization.    Follow Up Recommendations  No OT follow up     Assistance Recommended at Discharge PRN  Patient can return home with the following  Assist for transportation;Assistance with cooking/housework   Equipment Recommendations  None recommended by OT       Precautions / Restrictions Precautions Precautions: Fall Restrictions Weight Bearing Restrictions: No       Mobility Bed Mobility Overal bed mobility: Independent                  Transfers Overall transfer level: Modified  independent Equipment used: None Transfers: Sit to/from Stand Sit to Stand: Modified independent (Device/Increase time)                 Balance Overall balance assessment: Needs assistance Sitting-balance support: Feet supported Sitting balance-Leahy Scale: Good     Standing balance support: No upper extremity supported Standing balance-Leahy Scale: Fair                             ADL either performed or assessed with clinical judgement   ADL Overall ADL's : Modified independent                 Upper Body Dressing : Modified independent   Lower Body Dressing: Modified independent   Toilet Transfer: Modified Independent                  Extremity/Trunk Assessment Upper Extremity Assessment Upper Extremity Assessment: Overall WFL for tasks assessed   Lower Extremity Assessment Lower Extremity Assessment: Overall WFL for tasks assessed        Vision Patient Visual Report: No change from baseline            Cognition Arousal/Alertness: Awake/alert Behavior During Therapy: WFL for tasks assessed/performed Overall Cognitive Status: History of cognitive impairments - at baseline                                 General Comments: pleasant and agreeable to session  Pertinent Vitals/ Pain       Pain Assessment Pain Assessment: No/denies pain         Frequency  Min 2X/week        Progress Toward Goals  OT Goals(current goals can now be found in the care plan section)  Progress towards OT goals: Progressing toward goals     Plan Discharge plan remains appropriate;Frequency remains appropriate       AM-PAC OT "6 Clicks" Daily Activity     Outcome Measure   Help from another person eating meals?: None Help from another person taking care of personal grooming?: None Help from another person toileting, which includes using toliet, bedpan, or urinal?: None Help from another person bathing  (including washing, rinsing, drying)?: None Help from another person to put on and taking off regular upper body clothing?: None Help from another person to put on and taking off regular lower body clothing?: None 6 Click Score: 24    End of Session    OT Visit Diagnosis: Unsteadiness on feet (R26.81);Repeated falls (R29.6);Muscle weakness (generalized) (M62.81)   Activity Tolerance Patient tolerated treatment well   Patient Left with call bell/phone within reach;in chair   Nurse Communication Mobility status        Time: 1520-1540 OT Time Calculation (min): 20 min  Charges: OT General Charges $OT Visit: 1 Visit OT Treatments $Self Care/Home Management : 8-22 mins  Darleen Crocker, MS, OTR/L , CBIS ascom [phone]  07/15/22, 3:58 PM

## 2022-07-15 NOTE — NC FL2 (Addendum)
[place] LEVEL OF CARE FORM     IDENTIFICATION  Patient Name: Lawrence Morgan Birthdate: [birth year] Sex: male Admission Date (Current Location): 07/10/2022  [place] and [place] Number:  Engineering geologist and Address:  [facility], [address] [place], [place] [address]      Provider Number: [id number]  Attending Physician Name and Address:  Annita Brod, MD  Relative Name and Phone Number:  Rob Hickman- legal guardian- [phone]    Current Level of Care: Hospital Recommended Level of Care: [facility] Prior Approval Number:    Date Approved/Denied:   PASRR Number:    Discharge Plan: Other (Comment) ([place])    Current Diagnoses: Patient Active Problem List   Diagnosis Date Noted   Foreign body ingestion, initial encounter 07/11/2022   Suicide attempt ([place]) 07/11/2022   Hypothyroidism 07/11/2022   Dyslipidemia 07/11/2022   GERD without esophagitis [id number]   Periumbilical abdominal pain 07/11/2022   Schizoaffective disorder, bipolar type ([place]) 01/21/2021   Chronic constipation 01/20/2021   GERD (gastroesophageal reflux disease) 01/20/2021   Ingestion of foreign body 01/20/2021   Ingestion of foreign body, subsequent encounter 09/08/2019   Ingestion of foreign body, initial encounter 04/29/2018   Suicide ideation 04/29/2018   Schizoaffective disorder, bipolar type ([place])    PTSD (post-traumatic stress disorder)     Orientation RESPIRATION BLADDER Height & Weight     Self, Place  Normal Incontinent Weight: 256 lb 2.8 oz (116.2 kg) Height:  5' 10"$  (177.8 cm)  BEHAVIORAL SYMPTOMS/MOOD NEUROLOGICAL BOWEL NUTRITION STATUS   (None)  (None) Continent Diet (Regular)  AMBULATORY STATUS COMMUNICATION OF NEEDS Skin   Limited Assist Verbally Surgical wounds (Incision on abdomen: No dressing.)                       Personal Care Assistance Level of Assistance  Bathing, Dressing Bathing  Assistance: Limited assistance Feeding assistance: Independent Dressing Assistance: Limited assistance     Functional Limitations Info  Sight, Hearing, Speech Sight Info: Adequate Hearing Info: Adequate Speech Info: Adequate    SPECIAL CARE FACTORS FREQUENCY                       Contractures Contractures Info: Not present    Additional Factors Info  Code Status, Allergies, Psychotropic Code Status Info: Full code Allergies Info: Haldol (Haloperidol Lactate), Shellfish Allergy Psychotropic Info: Schizoaffective disorder, bipolar type         Current Medications (07/15/2022):  This is the current hospital active medication list Current Facility-Administered Medications  Medication Dose Route Frequency Provider Last Rate Last Admin   0.9 %  sodium chloride infusion   Intravenous Continuous Herbert Pun, MD   Held at 07/14/22 1930   acetaminophen (TYLENOL) tablet 650 mg  650 mg Oral Q6H PRN Herbert Pun, MD       Or   acetaminophen (TYLENOL) suppository 650 mg  650 mg Rectal Q6H PRN Herbert Pun, MD       albuterol (PROVENTIL) (2.5 MG/3ML) 0.083% nebulizer solution 2.5 mg  2.5 mg Nebulization Q6H PRN Herbert Pun, MD   2.5 mg at 07/14/22 0828   benztropine (COGENTIN) tablet 0.5 mg  0.5 mg Oral BID Herbert Pun, MD   0.5 mg at 07/15/22 [id number]   Chlorhexidine Gluconate Cloth 2 % PADS 6 each  6 each Topical Q0600 Lorella Nimrod, MD   6 each at 07/15/22 0908   cholecalciferol (VITAMIN D3) 25  MCG (1000 UNIT) tablet 2,000 Units  2,000 Units Oral Daily Herbert Pun, MD   2,000 Units at 07/15/22 0902   cloZAPine (CLOZARIL) tablet 50 mg  50 mg Oral QHS [name], [name], MD   50 mg at 07/14/22 2120   docusate sodium (COLACE) capsule 200 mg  200 mg Oral BID Herbert Pun, MD   200 mg at 07/15/22 0902   enoxaparin (LOVENOX) injection 52.5 mg  0.5 mg/kg Subcutaneous Q24H Herbert Pun, MD   52.5 mg at 07/15/22 [id number]    ferrous sulfate tablet 325 mg  325 mg Oral Q1200 Herbert Pun, MD   325 mg at 07/13/22 1244   guaiFENesin-dextromethorphan (ROBITUSSIN DM) 100-10 MG/5ML syrup 5 mL  5 mL Oral Q4H PRN Lorella Nimrod, MD   5 mL at 07/14/22 0828   hydrOXYzine (ATARAX) tablet 50 mg  50 mg Oral Q6H PRN Herbert Pun, MD   50 mg at 07/14/22 [id number]   levothyroxine (SYNTHROID) tablet 25 mcg  25 mcg Oral QAC breakfast Lorella Nimrod, MD   25 mcg at 07/15/22 0511   lithium carbonate (ESKALITH) ER tablet 900 mg  900 mg Oral QHS Herbert Pun, MD   900 mg at 07/14/22 2118   lithium carbonate capsule 300 mg  300 mg Oral BID Herbert Pun, MD   300 mg at 07/15/22 0903   loratadine (CLARITIN) tablet 10 mg  10 mg Oral Daily Lorella Nimrod, MD   10 mg at 07/15/22 0902   magnesium hydroxide (MILK OF MAGNESIA) suspension 30 mL  30 mL Oral Daily PRN Lorella Nimrod, MD       morphine (PF) 4 MG/ML injection 4 mg  4 mg Intravenous Q4H PRN Herbert Pun, MD   4 mg at 07/14/22 2124   OLANZapine zydis (ZYPREXA) disintegrating tablet 20 mg  20 mg Oral QHS Herbert Pun, MD   20 mg at 07/14/22 2119   ondansetron (ZOFRAN) tablet 4 mg  4 mg Oral Q6H PRN Herbert Pun, MD       Or   ondansetron (ZOFRAN) injection 4 mg  4 mg Intravenous Q6H PRN Herbert Pun, MD       Oral care mouth rinse  15 mL Mouth Rinse PRN Lorella Nimrod, MD       oxybutynin (DITROPAN-XL) 24 hr tablet 10 mg  10 mg Oral QHS Herbert Pun, MD   10 mg at 07/14/22 2118   pantoprazole (PROTONIX) EC tablet 40 mg  40 mg Oral Daily Herbert Pun, MD   40 mg at 07/15/22 0902   polyethylene glycol (MIRALAX / GLYCOLAX) packet 17 g  17 g Oral Daily Herbert Pun, MD   17 g at 07/15/22 [id number]   rosuvastatin (CRESTOR) tablet 20 mg  20 mg Oral QHS Lorella Nimrod, MD   20 mg at 07/14/22 2114   sertraline (ZOLOFT) tablet 100 mg  100 mg Oral Daily Herbert Pun, MD   100 mg at 07/15/22 0902   sodium  chloride (OCEAN) 0.65 % nasal spray 1 spray  1 spray Each Nare PRN Lorella Nimrod, MD       traZODone (DESYREL) tablet 100 mg  100 mg Oral QHS Herbert Pun, MD   100 mg at 07/14/22 2115   traZODone (DESYREL) tablet 25 mg  25 mg Oral QHS PRN Herbert Pun, MD       ziprasidone (GEODON) injection 10 mg  10 mg Intramuscular Q6H PRN Herbert Pun, MD         Discharge Medications: TAKE these medications  acetaminophen 325 MG tablet Commonly known as: TYLENOL Take 2 tablets (650 mg total) by mouth every 6 (six) hours as needed for mild pain (or Fever >/= 101).    albuterol 108 (90 Base) MCG/ACT inhaler Commonly known as: VENTOLIN HFA Inhale 2 puffs into the lungs every 6 (six) hours as needed for wheezing or shortness of breath.    benztropine 0.5 MG tablet Commonly known as: COGENTIN Take 1 tablet (0.5 mg total) by mouth 2 (two) times daily.    cloZAPine 100 MG tablet Commonly known as: CLOZARIL Take 2.5 tablets (250 mg total) by mouth at bedtime.    hydrOXYzine 50 MG tablet Commonly known as: ATARAX Take 1 tablet (50 mg total) by mouth every 6 (six) hours as needed for anxiety.    levothyroxine 25 MCG tablet Commonly known as: SYNTHROID Take 25 mcg by mouth daily.    lithium carbonate 450 MG ER tablet Commonly known as: ESKALITH Take 2 tablets (900 mg total) by mouth at bedtime.    lithium 300 MG tablet Take 300 mg by mouth 2 (two) times daily.    OLANZapine zydis 20 MG disintegrating tablet Commonly known as: ZYPREXA Take 1 tablet (20 mg total) by mouth at bedtime.    oxybutynin 10 MG 24 hr tablet Commonly known as: DITROPAN-XL Take 1 tablet (10 mg total) by mouth at bedtime.    paliperidone 234 MG/1.5ML injection Commonly known as: INVEGA SUSTENNA Inject 234 mg into the muscle every 28 (twenty-eight) days.    pantoprazole 40 MG tablet Commonly known as: PROTONIX Take 1 tablet (40 mg total) by mouth daily.    rosuvastatin 20 MG  tablet Commonly known as: CRESTOR Take 20 mg by mouth at bedtime.    sertraline 100 MG tablet Commonly known as: ZOLOFT Take 1 tablet (100 mg total) by mouth daily.    traZODone 100 MG tablet Commonly known as: DESYREL Take 1 tablet (100 mg total) by mouth at bedtime.    Vitamin D3 50 MCG (2000 UT) Caps Generic drug: Cholecalciferol Take 2,000 Units by mouth daily.      Relevant Imaging Results:  Relevant Lab Results:   Additional Information SS#: [id number]  Candie Chroman, LCSW

## 2022-07-15 NOTE — Assessment & Plan Note (Signed)
-   We will continue Synthroid. 

## 2022-07-15 NOTE — Care Management Important Message (Signed)
Important Message  Patient Details  Name: Lawrence Morgan MRN: [id number] Date of Birth: May 14, 1981   Medicare Important Message Given:  Yes  Copied of Medicare IM left in room on counter for reference.  Left message with Rob Hickman, father, at [phone] to make aware of Medicare IM form and to review information.  Encouraged callback if any questions.    Dannette Barbara 07/15/2022, 2:51 PM

## 2022-07-15 NOTE — Discharge Summary (Addendum)
Physician Discharge Summary   Patient: Lawrence Morgan MRN: [id number] DOB: May 15, 1981  Admit date:     07/10/2022  Discharge date: 07/15/22  Discharge Physician: Annita Brod   PCP: Pcp, No   Recommendations at discharge:    Patient to return back to group home  Discharge Diagnoses: Principal Problem:   Foreign body ingestion, initial encounter Active Problems:   Dyslipidemia   Schizoaffective disorder, bipolar type ([place])   Hypothyroidism   Suicide attempt ([place])   GERD without esophagitis  Resolved Problems:   Depression  Hospital Course: Taken from H&P.  Lawrence Morgan is a 42 y.o. male with medical history significant for schizoaffective disorder bipolar type, PTSD, marijuana abuse and intellectual disability, who presented to the emergency room from his group home with his assistant with acute onset of abdominal pain after ingesting 4-9V batteries 3 days ago.  He he apparently also swallowed 3 screws.  Patient also endorsed suicidal thoughts.  ED Course: When he came to the ER vital signs were within normal.  Labs revealed normal CMP and CBC showed no leukocytosis of 11.9.  Tylenol level was less than 10 and salicylate less than 7.  His influenza COVID-19 and RSV PCR came back negative   EKG as reviewed by me :  EKG showed normal sinus rhythm with a rate of 85 Imaging: 1 abdomen x-ray revealed the following: 1. Total of 3 screws overlying the right lower abdomen are noted (measuring approximately 4 cm). 2. Total of 3 rectangular batteries (measuring approximately 6.5 x 3.5 cm) overlying the mid abdomen. 3. Nonobstructive bowel gas pattern. 4. No findings to suggest pneumoperitoneum/bowel perforation with markedly limited evaluation on this supine radiograph. Recommend CT abdomen/pelvis with intravenous contrast for further evaluation.  Abdominal and pelvic CT scan without contrast revealed the following: 1. Three 5 x 2.5 cm rectangular batteries within the gastric  lumen. 2. Three 3 cm screws within the cecal lumen. 3. No bowel perforation. 4. Bilateral trace pleural effusions.   Dr. Marius Ditch was contacted about the patient and will plan for EGD in a.m.   Psych was also consulted and patient will be transferred to behavioral health after medical clearance.  2/11: Vitals and labs stable.  Leukocytosis resolved.  EGD did show 3 9V batteries in the stomach, unsuccessful attempt to remove them.  Please see the report.  General surgery was consulted for surgical removal.  Also found to have a small gastric ulcer with clean base.  2/12: Mildly elevated blood pressure at 143/90.  He was emergently taken to the OR after unsuccessful EGD due to increased risk of perforation s/p gastrostomy with removal of 3 9 V batteries.  Out of proportion pain this morning so surgical team ordered upper GI series to rule out any leakage from gastrostomy lesion. 2 separate notes from psych, initial one saying that he will go to behavioral health due to suicidal intention after medical clearance and treatment, followed by a second note saying that he is not a threat and does not need inpatient psych.  This is his second incidence and he was very clear initially that he did swallowed those things with intention to harm himself. Patient has a very extensive psych history.  Message sent to Dr. Weber Cooks for clarification, according to his secure chat patient has an habit of swallowing different objects due to his underlying psych illness.  No concern of suicide. CBC with worsening leukocytosis, most likely reactive with surgery.  Hemoglobin stable.  2/13: Mildly elevated blood pressure at  153/84, upper GI series done yesterday was limited as patient was unable to tolerate contrast and difficulty in positioning but did not show any obvious gastric perforation along the fundus.  Today pain is much improved and tolerating soft diet.  Labs with improving leukocytosis.  Surgery cleared him for  discharge. PT ordered and message sent to psych as he will be transferred to behavioral health.  2/14: Hemodynamically stable.  CT abdomen was repeated by general surgery today which was negative for any leak or perforation.  Did show 3 screws in the ascending colon.  Patient did not had any bowel movement yet and refusing to take MiraLAX.  Patient cleared by general surgery.  Patient followed up by psychiatry and felt that he was stable at this time.  They did not recommend any changes in medications.  They felt that he had returned back to his usual baseline would not benefit from inpatient psychiatric treatment.  Patient was also counseled on the impulsive behavior.  Assessment and Plan: * Foreign body ingestion, initial encounter Per psych this is not a suicidal attempt as patient has an habit of swallowing unusual objects.  S/p gastrotomy with removal of 3 9V batteries from his stomach by general surgery after a failed attempt via EGD.  Now tolerating soft diet, no bowel movements yet.  Upper GI series with difficult study but did not show any leak. CT abdomen today with no anastomosis leak or perforation, did show 3 screws in ascending colon.  Cleared by general surgery.  Dyslipidemia - We will continue statin therapy.  Hypothyroidism - We will continue Synthroid.  Schizoaffective disorder, bipolar type ([place]) - We will continue his psych  medications. - Psychiatry at this time not recommending any changes in medication.  I also feel the patient is stable and does not need inpatient psychiatry.  GERD without esophagitis - We will continue his PPI therapy.  Suicide attempt [facility]) Was evaluated by psychiatry, will be going to behavioral health.  Depression-resolved as of 07/11/2022 - We will continue his Zoloft and trazodone as well as his clozapine with Cogentin..         Consultants: General surgery, psychiatry, gastroenterology Procedures performed:  -EGD -Status post  gastrostomy with battery removal  Disposition: Group home Diet recommendation:  Discharge Diet Orders (From admission, onward)     Start     Ordered   07/15/22 0000  Diet general        07/15/22 1346           General diet DISCHARGE MEDICATION: Allergies as of 07/15/2022       Reactions   Haldol [haloperidol Lactate]    Haldol [haloperidol Lactate] Other (See Comments)   Unknown   Shellfish Allergy Itching, Rash        Medication List     TAKE these medications    acetaminophen 325 MG tablet Commonly known as: TYLENOL Take 2 tablets (650 mg total) by mouth every 6 (six) hours as needed for mild pain (or Fever >/= 101).   albuterol 108 (90 Base) MCG/ACT inhaler Commonly known as: VENTOLIN HFA Inhale 2 puffs into the lungs every 6 (six) hours as needed for wheezing or shortness of breath.   benztropine 0.5 MG tablet Commonly known as: COGENTIN Take 1 tablet (0.5 mg total) by mouth 2 (two) times daily.   cloZAPine 100 MG tablet Commonly known as: CLOZARIL Take 2.5 tablets (250 mg total) by mouth at bedtime.   hydrOXYzine 50 MG tablet Commonly known as:  ATARAX Take 1 tablet (50 mg total) by mouth every 6 (six) hours as needed for anxiety.   levothyroxine 25 MCG tablet Commonly known as: SYNTHROID Take 25 mcg by mouth daily.   lithium carbonate 450 MG ER tablet Commonly known as: ESKALITH Take 2 tablets (900 mg total) by mouth at bedtime.   lithium 300 MG tablet Take 300 mg by mouth 2 (two) times daily.   OLANZapine zydis 20 MG disintegrating tablet Commonly known as: ZYPREXA Take 1 tablet (20 mg total) by mouth at bedtime.   oxybutynin 10 MG 24 hr tablet Commonly known as: DITROPAN-XL Take 1 tablet (10 mg total) by mouth at bedtime.   paliperidone 234 MG/1.5ML injection Commonly known as: INVEGA SUSTENNA Inject 234 mg into the muscle every 28 (twenty-eight) days.   pantoprazole 40 MG tablet Commonly known as: PROTONIX Take 1 tablet (40 mg  total) by mouth daily.   rosuvastatin 20 MG tablet Commonly known as: CRESTOR Take 20 mg by mouth at bedtime.   sertraline 100 MG tablet Commonly known as: ZOLOFT Take 1 tablet (100 mg total) by mouth daily.   traZODone 100 MG tablet Commonly known as: DESYREL Take 1 tablet (100 mg total) by mouth at bedtime.   Vitamin D3 50 MCG (2000 UT) Caps Generic drug: Cholecalciferol Take 2,000 Units by mouth daily.               Durable Medical Equipment  (From admission, onward)           Start     Ordered   07/15/22 1424  For home use only DME Walker rolling  Once       Question Answer Comment  Walker: With 5 Inch Wheels   Patient needs a walker to treat with the following condition Depression      07/15/22 1424            Discharge Exam: Filed Weights   07/10/22 2328 07/11/22 1258 07/11/22 1835  Weight: 104 kg 104 kg 116.2 kg   General: Alert and oriented x 2-3, no acute distress Cardiovascular: Regular rate and rhythm, S1-S2  Condition at discharge: good  The results of significant diagnostics from this hospitalization (including imaging, microbiology, ancillary and laboratory) are listed below for reference.   Imaging Studies: CT ABDOMEN WO CONTRAST  Result Date: 07/14/2022 CLINICAL DATA:  Query perforation EXAM: CT ABDOMEN WITHOUT CONTRAST TECHNIQUE: Multidetector CT imaging of the abdomen was performed following the standard protocol without IV contrast. Oral enteric contrast was administered. RADIATION DOSE REDUCTION: This exam was performed according to the departmental dose-optimization program which includes automated exposure control, adjustment of the mA and/or kV according to patient size and/or use of iterative reconstruction technique. COMPARISON:  Same-day chest radiograph, CT abdomen pelvis, 07/11/2022 FINDINGS: Lower chest: Small bilateral pleural effusions and associated atelectasis or consolidation Hepatobiliary: No solid liver abnormality is  seen. No gallstones, gallbladder wall thickening, or biliary dilatation. Pancreas: Unremarkable. No pancreatic ductal dilatation or surrounding inflammatory changes. Spleen: Normal in size without significant abnormality. Adrenals/Urinary Tract: Adrenal glands are unremarkable. Kidneys are normal, without renal calculi, solid lesion, or hydronephrosis. Stomach/Bowel: Batteries previously seen at the pylorus are no longer present. Mild wall thickening of the distal gastric body and gastric antrum (series 3, image 27) with adjacent fat stranding. Three metallic screws within the proximal ascending colon (series 3, image 48). No evidence of bowel perforation. Vascular/Lymphatic: No significant vascular findings are present. No enlarged abdominal lymph nodes. Other: No abdominal wall hernia or  abnormality. No ascites. Musculoskeletal: No acute or significant osseous findings. IMPRESSION: 1. Batteries previously seen at the pylorus are no longer present. 2. Mild wall thickening of the distal gastric body and gastric antrum with adjacent fat stranding, consistent with nonspecific infectious or inflammatory gastritis. 3. Three metallic screws within the proximal ascending colon. 4. No evidence of bowel perforation.  No free air in the abdomen. 5. Small bilateral pleural effusions and associated atelectasis or consolidation. Electronically Signed   By: Delanna Ahmadi M.D.   On: 07/14/2022 12:21   DG Chest Port 1 View  Result Date: 07/14/2022 CLINICAL DATA:  Shortness of breath. EXAM: PORTABLE CHEST 1 VIEW COMPARISON:  01/20/2022. FINDINGS: Areas of probable atelectasis/scarring bilaterally. No consolidation. No visible pleural effusions or pneumothorax. Cardiomediastinal silhouette is within normal limits and unchanged. No acute osseous abnormality. IMPRESSION: Areas of probable atelectasis/scarring bilaterally. No consolidation. Electronically Signed   By: Margaretha Sheffield M.D.   On: 07/14/2022 08:22   DG UGI W  SINGLE CM (SOL OR THIN BA)  Result Date: 07/12/2022 CLINICAL DATA:  Patient is 1 day status post-operative gastrostomy with foreign body removal. Patient reporting pain out of proportion to expected pain levels. Concern for gastric leak EXAM: DG UGI W SINGLE CM TECHNIQUE: Scout radiograph was obtained. Single contrast examination was performed using thin water-soluble contrast. This exam was performed by Reatha Armour, PA-C , and was supervised and interpreted by Dr Kathreen Devoid. This exam was significantly limited by patient participation as he refused to drink more than 4 swallows of contrast material. The patient could only tolerate RPO and left lateral views. FLUOROSCOPY: Radiation Exposure Index (as provided by the fluoroscopic device): 26.30 mGy Kerma COMPARISON:  CT abdomen 07/11/2022 FINDINGS: Esophagus:  Normal appearance. Gastroesophageal reflux:  None visualized. Stomach: Extremely limited examination as the patient is unable to tolerate ingestion of contrast and difficulty in positioning. No obvious gastric perforation along the fundus. The body and antrum of stomach are not well visualized or opacified to definitively exclude a perforation. Gastric emptying: Unable to be evaluated Duodenum:  Unable to be evaluated Other:  None. IMPRESSION: Extremely limited examination as the patient is unable to tolerate ingestion of contrast and difficulty in positioning. No obvious gastric perforation along the fundus. The body and antrum of stomach are not well visualized or opacified to definitively exclude a perforation. If there is further clinical concern, recommend a CT of the abdomen and pelvis. Electronically Signed   By: Kathreen Devoid M.D.   On: 07/12/2022 12:53   CT ABDOMEN PELVIS WO CONTRAST  Result Date: 07/11/2022 CLINICAL DATA:  Abdominal pain, acute, nonlocalized 3 batteries and 3 screws EXAM: CT ABDOMEN AND PELVIS WITHOUT CONTRAST TECHNIQUE: Multidetector CT imaging of the abdomen and pelvis was  performed following the standard protocol without IV contrast. RADIATION DOSE REDUCTION: This exam was performed according to the departmental dose-optimization program which includes automated exposure control, adjustment of the mA and/or kV according to patient size and/or use of iterative reconstruction technique. COMPARISON:  X-ray abdomen 07/11/2022 FINDINGS: Lower chest: Bilateral trace pleural effusions. Trace hiatal hernia. Hepatobiliary: No focal liver abnormality. No gallstones, gallbladder wall thickening, or pericholecystic fluid. No biliary dilatation. Pancreas: No focal lesion. Normal pancreatic contour. No surrounding inflammatory changes. No main pancreatic ductal dilatation. Spleen: Normal in size without focal abnormality. Adrenals/Urinary Tract: No adrenal nodule bilaterally. No nephrolithiasis and no hydronephrosis. No definite contour-deforming renal mass. No ureterolithiasis or hydroureter. The urinary bladder is unremarkable. Stomach/Bowel: Three 5 x 2.5 cm rectangular  batteries within the gastric lumen. Three 3 cm screws within the cecal lumen. Stomach is within normal limits. No evidence of bowel wall thickening or dilatation. Appendix appears normal. Vascular/Lymphatic: No abdominal aorta or iliac aneurysm. No abdominal, pelvic, or inguinal lymphadenopathy. Reproductive: Prostate is unremarkable. Other: No intraperitoneal free fluid. No intraperitoneal free gas. No organized fluid collection. Musculoskeletal: No abdominal wall hernia or abnormality. No suspicious lytic or blastic osseous lesions. No acute displaced fracture. Multilevel degenerative changes of the spine. IMPRESSION: 1. Three 5 x 2.5 cm rectangular batteries within the gastric lumen. 2. Three 3 cm screws within the cecal lumen. 3. No bowel perforation. 4. Bilateral trace pleural effusions. Electronically Signed   By: Iven Finn M.D.   On: 07/11/2022 01:12   DG Abdomen 1 View  Result Date: 07/11/2022 CLINICAL DATA:   ingested 4 9v batteries 2 days ago and only passed 2 EXAM: ABDOMEN - 1 VIEW COMPARISON:  X-ray abdomen 07/21/2021 FINDINGS: Total of 3 screws overlying the right lower abdomen are noted (measuring approximately 4 cm). Total of 3 rectangular batteries (measuring approximately 6.5 x 3.5 cm) overlying the mid abdomen. Previously identified longer screw measuring 7.5 cm on radiograph 02/29/2023 no longer visualized. The bowel gas pattern is normal. No findings to suggest pneumoperitoneum; however, limited evaluation on this supine radiograph. IMPRESSION: 1. Total of 3 screws overlying the right lower abdomen are noted (measuring approximately 4 cm). 2. Total of 3 rectangular batteries (measuring approximately 6.5 x 3.5 cm) overlying the mid abdomen. 3. Nonobstructive bowel gas pattern. 4. No findings to suggest pneumoperitoneum/bowel perforation with markedly limited evaluation on this supine radiograph. Recommend CT abdomen/pelvis with intravenous contrast for further evaluation. These results were called by telephone at the time of interpretation on 07/11/2022 at 12:09 am to provider [facility] , who verbally acknowledged these results. Electronically Signed   By: Iven Finn M.D.   On: 07/11/2022 00:10    Microbiology: Results for orders placed or performed during the hospital encounter of 07/10/22  Resp panel by RT-PCR (RSV, Flu A&B, Covid) Anterior Nasal Swab     Status: None   Collection Time: 07/10/22 11:44 PM   Specimen: Anterior Nasal Swab  Result Value Ref Range Status   SARS Coronavirus 2 by RT PCR NEGATIVE NEGATIVE Final    Comment: (NOTE) SARS-CoV-2 target nucleic acids are NOT DETECTED.  The SARS-CoV-2 RNA is generally detectable in upper respiratory specimens during the acute phase of infection. The lowest concentration of SARS-CoV-2 viral copies this assay can detect is 138 copies/mL. A negative result does not preclude SARS-Cov-2 infection and should not be used as the sole basis  for treatment or other patient management decisions. A negative result may occur with  improper specimen collection/handling, submission of specimen other than nasopharyngeal swab, presence of viral mutation(s) within the areas targeted by this assay, and inadequate number of viral copies(<138 copies/mL). A negative result must be combined with clinical observations, patient history, and epidemiological information. The expected result is Negative.  Fact Sheet for Patients:  EntrepreneurPulse.com.au  Fact Sheet for Healthcare Providers:  IncredibleEmployment.be  This test is no t yet approved or cleared by the [place] FDA and  has been authorized for detection and/or diagnosis of SARS-CoV-2 by FDA under an Emergency Use Authorization (EUA). This EUA will remain  in effect (meaning this test can be used) for the duration of the COVID-19 declaration under Section 564(b)(1) of the Act, 21 U.S.C.section 360bbb-3(b)(1), unless the authorization is terminated  or revoked sooner.  Influenza A by PCR NEGATIVE NEGATIVE Final   Influenza B by PCR NEGATIVE NEGATIVE Final    Comment: (NOTE) The Xpert Xpress SARS-CoV-2/FLU/RSV plus assay is intended as an aid in the diagnosis of influenza from Nasopharyngeal swab specimens and should not be used as a sole basis for treatment. Nasal washings and aspirates are unacceptable for Xpert Xpress SARS-CoV-2/FLU/RSV testing.  Fact Sheet for Patients: EntrepreneurPulse.com.au  Fact Sheet for Healthcare Providers: IncredibleEmployment.be  This test is not yet approved or cleared by the [place] FDA and has been authorized for detection and/or diagnosis of SARS-CoV-2 by FDA under an Emergency Use Authorization (EUA). This EUA will remain in effect (meaning this test can be used) for the duration of the COVID-19 declaration under Section 564(b)(1) of the Act, 21  U.S.C. section 360bbb-3(b)(1), unless the authorization is terminated or revoked.     Resp Syncytial Virus by PCR NEGATIVE NEGATIVE Final    Comment: (NOTE) Fact Sheet for Patients: EntrepreneurPulse.com.au  Fact Sheet for Healthcare Providers: IncredibleEmployment.be  This test is not yet approved or cleared by the [place] FDA and has been authorized for detection and/or diagnosis of SARS-CoV-2 by FDA under an Emergency Use Authorization (EUA). This EUA will remain in effect (meaning this test can be used) for the duration of the COVID-19 declaration under Section 564(b)(1) of the Act, 21 U.S.C. section 360bbb-3(b)(1), unless the authorization is terminated or revoked.  Performed at [facility], [address] [place], [place] [address]   MRSA Next Gen by PCR, Nasal     Status: None   Collection Time: 07/11/22  6:39 PM   Specimen: Nasal Mucosa; Nasal Swab  Result Value Ref Range Status   MRSA by PCR Next Gen NOT DETECTED NOT DETECTED Final    Comment: (NOTE) The GeneXpert MRSA Assay (FDA approved for NASAL specimens only), is one component of a comprehensive MRSA colonization surveillance program. It is not intended to diagnose MRSA infection nor to guide or monitor treatment for MRSA infections. Test performance is not FDA approved in patients less than 14 years old. Performed at [facility], [address] [place], [place] [address]     Labs: CBC: Recent Labs  Lab 07/10/22 [id number] 07/11/22 0436 07/12/22 1109 07/13/22 0539  WBC 11.9* 8.9 17.7*  17.7* 11.7*  NEUTROABS  --   --  14.2*  --   HGB 13.0 11.7* 12.1*  12.0* 11.4*  HCT 41.3 37.9* 38.2*  38.4* 36.2*  MCV 84.3 86.7 85.1  85.5 85.4  PLT 235 210 209  213 [id number]   Basic Metabolic Panel: Recent Labs  Lab 07/10/22 2334 07/11/22 0436 07/12/22 1109  NA 138 137 138  K 3.7 3.8 3.8  CL 107 108 109  CO2 24 25 22  $ GLUCOSE 93 113* 107*  BUN 11  10 11  $ CREATININE 1.01 0.90 0.97  CALCIUM 9.5 8.9 8.9   Liver Function Tests: Recent Labs  Lab 07/10/22 2334  AST 24  ALT 24  ALKPHOS 78  BILITOT 0.6  PROT 7.6  ALBUMIN 4.6   CBG: Recent Labs  Lab 07/11/22 1837  GLUCAP 153*    Discharge time spent: less than 30 minutes.  Signed: Annita Brod, MD [facility] 07/15/2022

## 2022-07-15 NOTE — Plan of Care (Signed)
IV's removed, discharge instructions given to the group home assistant.  Ronnie from the group home called and discharge instructions were reviewed with him

## 2022-07-15 NOTE — Progress Notes (Signed)
Physical Therapy Treatment Patient Details Name: Lawrence Morgan MRN: [id number] DOB: 11/03/1980 Today's Date: 07/15/2022   History of Present Illness 42 y.o. male with medical history significant for schizoaffective disorder bipolar type, PTSD, marijuana abuse and intellectual disability, who presented to the emergency room from his group home with his assistant with acute onset of abdominal pain after ingesting 4-9V batteries 3 days ago.  He he apparently also swallowed 3 screws.    PT Comments    Follow-up session performed with improved ambulation performed this date. Mobility performed without AD with occasional min assist for LOB during turns. Would recommend continued use of RW for all mobility to improve functional independence. Pt ready to eat lunch tray, seated in recliner with telesitter in room. Will continue to progress.   Recommendations for follow up therapy are one component of a multi-disciplinary discharge planning process, led by the attending physician.  Recommendations may be updated based on patient status, additional functional criteria and insurance authorization.  Follow Up Recommendations  Outpatient PT     Assistance Recommended at Discharge PRN  Patient can return home with the following A little help with walking and/or transfers;Help with stairs or ramp for entrance   Equipment Recommendations  Rolling walker (2 wheels)    Recommendations for Other Services       Precautions / Restrictions Precautions Precautions: Fall Restrictions Weight Bearing Restrictions: No     Mobility  Bed Mobility Overal bed mobility: Independent       Supine to sit: Independent     General bed mobility comments: safe technique    Transfers Overall transfer level: Modified independent Equipment used: None Transfers: Sit to/from Stand Sit to Stand: Modified independent (Device/Increase time)           General transfer comment: upright posture. Pushes from  seated surface. Slight increased sway with standing    Ambulation/Gait Ambulation/Gait assistance: Min guard, Min assist Gait Distance (Feet): 200 Feet Assistive device: None Gait Pattern/deviations: Step-through pattern       General Gait Details: ambulated without use of AD this session around RN station. Reciprocal gait, however does demonstrate decreased balance when performing turns needing min assist for LOB. 2 instances of veering towards wall. Recommend use of RW for improved independence   Stairs             Wheelchair Mobility    Modified Rankin (Stroke Patients Only)       Balance Overall balance assessment: Needs assistance Sitting-balance support: Feet supported Sitting balance-Leahy Scale: Good     Standing balance support: No upper extremity supported Standing balance-Leahy Scale: Fair                              Cognition Arousal/Alertness: Awake/alert Behavior During Therapy: WFL for tasks assessed/performed Overall Cognitive Status: History of cognitive impairments - at baseline                                 General Comments: pleasant and agreeable to session        Exercises      General Comments        Pertinent Vitals/Pain Pain Assessment Pain Assessment: No/denies pain    Home Living                          Prior Function  PT Goals (current goals can now be found in the care plan section) Acute Rehab PT Goals Patient Stated Goal: to get stronger PT Goal Formulation: With patient Time For Goal Achievement: 07/28/22 Potential to Achieve Goals: Good Progress towards PT goals: Progressing toward goals    Frequency    Min 2X/week      PT Plan Discharge plan needs to be updated    Co-evaluation              AM-PAC PT "6 Clicks" Mobility   Outcome Measure  Help needed turning from your back to your side while in a flat bed without using bedrails?: None Help  needed moving from lying on your back to sitting on the side of a flat bed without using bedrails?: None Help needed moving to and from a bed to a chair (including a wheelchair)?: A Little Help needed standing up from a chair using your arms (e.g., wheelchair or bedside chair)?: A Little Help needed to walk in hospital room?: A Little Help needed climbing 3-5 steps with a railing? : A Little 6 Click Score: 20    End of Session   Activity Tolerance: Patient tolerated treatment well Patient left: in chair (telesitter present) Nurse Communication: Mobility status PT Visit Diagnosis: Muscle weakness (generalized) (M62.81);Pain     Time: [id number] PT Time Calculation (min) (ACUTE ONLY): 10 min  Charges:  $Gait Training: 8-22 mins                     Greggory Stallion, PT, DPT, GCS [phone]    , 07/15/2022, 1:42 PM

## 2023-01-07 ENCOUNTER — Emergency Department: Payer: Medicare Other

## 2023-01-07 ENCOUNTER — Emergency Department
Admission: EM | Admit: 2023-01-07 | Discharge: 2023-01-07 | Disposition: A | Discharge status: Home / Self Care | Payer: Medicare Other | Attending: Emergency Medicine | Admitting: Emergency Medicine

## 2023-01-07 ENCOUNTER — Other Ambulatory Visit: Payer: Self-pay

## 2023-01-07 DIAGNOSIS — R112 Nausea with vomiting, unspecified: Secondary | ICD-10-CM | POA: Diagnosis present

## 2023-01-07 DIAGNOSIS — R1110 Vomiting, unspecified: Secondary | ICD-10-CM | POA: Insufficient documentation

## 2023-01-07 DIAGNOSIS — K439 Ventral hernia without obstruction or gangrene: Secondary | ICD-10-CM | POA: Diagnosis not present

## 2023-01-07 DIAGNOSIS — E039 Hypothyroidism, unspecified: Secondary | ICD-10-CM | POA: Insufficient documentation

## 2023-01-07 LAB — COMPREHENSIVE METABOLIC PANEL
ALT: 21 U/L (ref 0–44)
AST: 24 U/L (ref 15–41)
Albumin: 4.2 g/dL (ref 3.5–5.0)
Alkaline Phosphatase: 79 U/L (ref 38–126)
Anion gap: 6 (ref 5–15)
BUN: 7 mg/dL (ref 6–20)
CO2: 22 mmol/L (ref 22–32)
Calcium: 9.4 mg/dL (ref 8.9–10.3)
Chloride: 109 mmol/L (ref 98–111)
Creatinine, Ser: 0.9 mg/dL (ref 0.61–1.24)
GFR, Estimated: >60 mL/min (ref >60)
Glucose, Bld: 101 mg/dL — ABNORMAL HIGH (ref 70–99)
Potassium: 4 mmol/L (ref 3.5–5.1)
Sodium: 137 mmol/L (ref 135–145)
Total Bilirubin: 0.6 mg/dL (ref 0.3–1.2)
Total Protein: 7.1 g/dL (ref 6.5–8.1)

## 2023-01-07 LAB — CBC
HCT: 42.9 % (ref 39.0–52.0)
Hemoglobin: 13.2 g/dL (ref 13.0–17.0)
MCH: 27.2 pg (ref 26.0–34.0)
MCHC: 30.8 g/dL (ref 30.0–36.0)
MCV: 88.3 fL (ref 80.0–100.0)
Platelets: 223 10*3/uL (ref 150–400)
RBC: 4.86 MIL/uL (ref 4.22–5.81)
RDW: 14.5 % (ref 11.5–15.5)
WBC: 7.3 10*3/uL (ref 4.0–10.5)
nRBC: 0 % (ref 0.0–0.2)

## 2023-01-07 LAB — LIPASE, BLOOD: Lipase: 31 U/L (ref 11–51)

## 2023-01-07 MED ORDER — IOHEXOL 300 MG/ML  SOLN
100.0000 mL | Freq: Once | INTRAMUSCULAR | Status: AC | PRN
  Administered 2023-01-07: 100 mL via INTRAVENOUS

## 2023-01-07 NOTE — ED Notes (Signed)
Pt A&O x4, no obvious distress noted, respirations regular/unlabored. Pt verbalizes understanding of discharge instructions. Pt able to ambulate from ED independently.   

## 2023-01-07 NOTE — ED Notes (Signed)
Attempted to contact LG ricky, no answer. Pt states he is already aware of arrival to ED

## 2023-01-07 NOTE — ED Provider Notes (Signed)
[facility] Provider Note    Event Date/Time   First MD Initiated Contact with Patient 01/07/23 1058     (approximate)   History   Abdominal Pain   HPI  Lawrence Morgan is a 42 y.o. male with history of schizoaffective disorder, bipolar type, suicide ideations, ingestion of foreign body such as batteries, hypothyroidism, dyslipidemia, and intellectual disability presents emergency department with mid abdominal pain.  Patient states he has a bulging area around the incision or they had to go in and take batteries out of his intestines.  States that the area has been tender.  Felt like when he ate food last night that it became stuck and felt full in this area.  Was concerned it might "pop "has chronic episodes of vomiting.  States his psychiatric medication will make him nauseated.      Physical Exam   Triage Vital Signs: ED Triage Vitals  Encounter Vitals Group     BP 01/07/23 1040 (!) 131/90     Systolic BP Percentile --      Diastolic BP Percentile --      Pulse Rate 01/07/23 1040 92     Resp 01/07/23 1040 18     Temp 01/07/23 1040 98.8 F (37.1 C)     Temp src --      SpO2 01/07/23 1040 100 %     Weight 01/07/23 1041 242 lb (109.8 kg)     Height 01/07/23 1041 5\' 10"  (1.778 m)     Head Circumference --      Peak Flow --      Pain Score 01/07/23 1040 2     Pain Loc --      Pain Education --      Exclude from Growth Chart --     Most recent vital signs: Vitals:   01/07/23 1040  BP: (!) 131/90  Pulse: 92  Resp: 18  Temp: 98.8 F (37.1 C)  SpO2: 100%     General: Awake, no distress.   CV:  Good peripheral perfusion. regular rate and  rhythm Resp:  Normal effort. Lungs cta Abd:  No distention.  Some swelling noted along the surgical incision, worse when the patient sits up like a ventral hernia, area is very tender to palpation, bowel sounds all 4 quads Other:      ED Results / Procedures / Treatments   Labs (all labs ordered are  listed, but only abnormal results are displayed) Labs Reviewed  COMPREHENSIVE METABOLIC PANEL - Abnormal; Notable for the following components:      Result Value   Glucose, Bld 101 (*)    All other components within normal limits  LIPASE, BLOOD  CBC  URINALYSIS, ROUTINE W REFLEX MICROSCOPIC     EKG     RADIOLOGY CT abdomen pelvis IV contrast    PROCEDURES:   Procedures   MEDICATIONS ORDERED IN ED: Medications  iohexol (OMNIPAQUE) 300 MG/ML solution 100 mL (100 mLs Intravenous Contrast Given 01/07/23 1201)     IMPRESSION / MDM / ASSESSMENT AND PLAN / ED COURSE  I reviewed the triage vital signs and the nursing notes.                              Differential diagnosis includes, but is not limited to, hernia, small bowel obstruction, abscess, ingested foreign body  Patient's presentation is most consistent with acute presentation with potential threat to life or  bodily function.   Due to the patient's history of ingesting foreign bodies along with the pain and feeling of fullness we will go ahead and do CT abdomen pelvis with IV contrast  Labs ordered, labs are reassuring  CT abdomen pelvis, independently reviewed interpreted by me as being positive for a fat filled hernia at the ventral area.  This is the area of concern for the patient.  No other acute abnormality noted.  I did explain the findings to the patient and his caregiver.  They are to follow-up with his regular doctor.  Return emergency department worsening.  He is in agreement treatment plan.  Discharged in stable condition.      FINAL CLINICAL IMPRESSION(S) / ED DIAGNOSES   Final diagnoses:  Ventral hernia without obstruction or gangrene     Rx / DC Orders   ED Discharge Orders     None        Note:  This document was prepared using Dragon voice recognition software and may include unintentional dictation errors.    Faythe Ghee, PA-C 01/07/23 1312    Corena Herter,  MD 01/07/23 1517

## 2023-01-07 NOTE — ED Triage Notes (Signed)
Pt to ED for n/v/d for weeks, swelling to mid abd for one week. Had laparotomy in feb.

## 2023-01-20 ENCOUNTER — Ambulatory Visit: Payer: Self-pay | Admitting: General Surgery

## 2023-01-20 NOTE — H&P (View-Only) (Signed)
 PATIENT PROFILE: Lawrence Morgan is a 42 y.o. male who presents to the Clinic for consultation at the request of Dr. Clint Guy for evaluation of incisional hernia hernia.  PCP:  Franco Nones  HISTORY OF PRESENT ILLNESS: Lawrence Morgan reports he has been feeling a bulge in his epigastric area since 65-month ago.  He endorses that he is experiencing pain in the epigastric area.  Pain does not radiate to other part of body.  Pain is aggravated by applying pressure.  Pain is alleviated by resting.  He is very anxious about the bulge.  Patient has history of schizoaffective disorder with previous ingestion of foreign bodies.  He had an exploratory laparotomy with removal of foreign body (9 V battery) from the stomach.  Patient lives in a group home facility.  Patient getting psychiatric treatment and currently stable.   PROBLEM LIST: Problem List  Date Reviewed: 04/14/2022  None   GENERAL REVIEW OF SYSTEMS:   General ROS: negative for - chills, fatigue, fever, weight gain or weight loss Allergy and Immunology ROS: negative for - hives  Hematological and Lymphatic ROS: negative for - bleeding problems or bruising, negative for palpable nodes Endocrine ROS: negative for - heat or cold intolerance, hair changes Respiratory ROS: negative for - cough, shortness of breath or wheezing Cardiovascular ROS: no chest pain or palpitations GI ROS: negative for nausea, vomiting, diarrhea, constipation.  Positive for abdominal pain Musculoskeletal ROS: negative for - joint swelling or muscle pain Neurological ROS: negative for - confusion, syncope Dermatological ROS: negative for pruritus and rash Psychiatric: Positive for anxiety, depression  MEDICATIONS: Current Outpatient Medications  Medication Sig Dispense Refill   acetaminophen (TYLENOL) 325 MG tablet Take 650 mg by mouth every 4 (four) hours as needed for Pain     albuterol MDI, PROVENTIL, VENTOLIN, PROAIR, HFA 90 mcg/actuation inhaler as needed      ascorbic acid, vitamin C, (VITAMIN C) 500 MG tablet Take 500 mg by mouth once daily     benztropine (COGENTIN) 0.5 MG tablet      cholecalciferol, vitamin D3, (VITAMIN D3) 125 mcg (5,000 unit) tablet Take 5,000 Units by mouth once daily     cloZAPine (CLOZARIL) 100 MG tablet Take 250 mg by mouth at bedtime     docusate (COLACE) 100 MG capsule Take 100 mg by mouth 2 (two) times daily     ferrous sulfate 325 (65 FE) MG EC tablet Take 325 mg by mouth daily with breakfast     hydrOXYzine (VISTARIL) 50 MG capsule      ibuprofen (MOTRIN) 600 MG tablet Take 600 mg by mouth every 6 (six) hours as needed for Pain     INVEGA SUSTENNA 234 mg/1.5 mL IM syringe      levothyroxine (SYNTHROID) 25 MCG tablet      lidocaine (LIDODERM) 5 % patch Place 1 patch onto the skin daily Apply patch to the most painful area for up to 12 hours in a 24 hour period.     lithium carbonate 300 mg tablet Take 300 mg by mouth 2 (two) times daily At night dose also takes 2 tablets of 450 mg along with the 300 mg     OLANZapine (ZYPREXA ZYDIS) 20 MG disintegrating tablet      oxyBUTYnin (DITROPAN XL) 15 MG XL tablet once daily     pantoprazole (PROTONIX) 40 MG DR tablet      rosuvastatin (CRESTOR) 20 MG tablet      sennosides (SENOKOT) 8.6 mg tablet Take 1 tablet  by mouth once daily     sertraline (ZOLOFT) 100 MG tablet      traZODone (DESYREL) 100 MG tablet 50 mg     polyethylene glycol (MIRALAX) powder Take 17 g by mouth once daily Mix in 4-8ounces of fluid prior to taking. (Patient not taking: Reported on 01/20/2023)     No current facility-administered medications for this visit.    ALLERGIES: Patient has no known allergies.  PAST MEDICAL HISTORY: Schizoaffective schizophrenia Intellectual disability  PAST SURGICAL HISTORY: Exploratory laparotomy 07/11/2022  FAMILY HISTORY: Family history reviewed.  No pertinent family history  SOCIAL HISTORY: Social History   Socioeconomic History   Marital status:  Single  Tobacco Use   Smoking status: Every Day    Current packs/day: 0.50    Types: Cigarettes   Smokeless tobacco: Never  Vaping Use   Vaping status: Never Used  Substance and Sexual Activity   Alcohol use: Never   Drug use: Never    PHYSICAL EXAM: Vitals:   01/20/23 1346  BP: 117/76  Pulse: 91   Body mass index is 32.71 kg/m. Weight: (!) 103.4 kg (228 lb)   GENERAL: Alert, active, oriented x3  HEENT: Pupils equal reactive to light. Extraocular movements are intact. Sclera clear. Palpebral conjunctiva normal red color.Pharynx clear.  NECK: Supple with no palpable mass and no adenopathy.  LUNGS: Sound clear with no rales rhonchi or wheezes.  HEART: Regular rhythm S1 and S2 without murmur.  ABDOMEN: Soft and depressible, nontender with no palpable mass, no hepatomegaly.  Reducible bulge in the epigastric area.  EXTREMITIES: Well-developed well-nourished symmetrical with no dependent edema.  NEUROLOGICAL: Awake alert oriented, facial expression symmetrical, moving all extremities.  REVIEW OF DATA: I have reviewed the following data today: No visits with results within 3 Month(s) from this visit.  Latest known visit with results is:  No results found for any previous visit.  Last CBC checked on: Epic done on January 07, 2023 shows white blood cell count of 7.3 and hemoglobin of 13.2 with adequate platelets.  CMP with adequate electrolyte and normal renal function.  ASSESSMENT: Lawrence Morgan is a 41 y.o. male presenting for consultation for incisional hernia.    The patient presents with a symptomatic incisional hernia. Patient was oriented about the diagnosis of incisional hernia and its implication. The patient was oriented about the treatment alternatives (observation vs surgical repair). Due to patient symptoms, repair is recommended. Patient oriented about the surgical procedure, the use of mesh and its risk of complications such as: infection, bleeding, injury to  vasculature, injury to bowel or bladder, and chronic pain, intestinal obstruction, among others.   Incisional hernia, without obstruction or gangrene [K43.2]  PLAN: Robotic assisted laparoscopic incisional hernia repair with mesh (16109) Avoid aspirin or any blood thinner 5 days before the surgery Contact us if you have any concern.   Patient verbalized understanding, all questions were answered, and were agreeable with the plan outlined above.    Carolan Shiver, MD

## 2023-01-20 NOTE — H&P (Signed)
PATIENT PROFILE: Lawrence Morgan is a 42 y.o. male who presents to the Clinic for consultation at the request of Dr. Clint Morgan for evaluation of incisional hernia hernia.  PCP:  Lawrence Morgan  HISTORY OF PRESENT ILLNESS: Lawrence Morgan reports he has been feeling a bulge in his epigastric area since 65-month ago.  He endorses that he is experiencing pain in the epigastric area.  Pain does not radiate to other part of body.  Pain is aggravated by applying pressure.  Pain is alleviated by resting.  He is very anxious about the bulge.  Patient has history of schizoaffective disorder with previous ingestion of foreign bodies.  He had an exploratory laparotomy with removal of foreign body (9 V battery) from the stomach.  Patient lives in a group home facility.  Patient getting psychiatric treatment and currently stable.   PROBLEM LIST: Problem List  Date Reviewed: 04/14/2022  None   GENERAL REVIEW OF SYSTEMS:   General ROS: negative for - chills, fatigue, fever, weight gain or weight loss Allergy and Immunology ROS: negative for - hives  Hematological and Lymphatic ROS: negative for - bleeding problems or bruising, negative for palpable nodes Endocrine ROS: negative for - heat or cold intolerance, hair changes Respiratory ROS: negative for - cough, shortness of breath or wheezing Cardiovascular ROS: no chest pain or palpitations GI ROS: negative for nausea, vomiting, diarrhea, constipation.  Positive for abdominal pain Musculoskeletal ROS: negative for - joint swelling or muscle pain Neurological ROS: negative for - confusion, syncope Dermatological ROS: negative for pruritus and rash Psychiatric: Positive for anxiety, depression  MEDICATIONS: Current Outpatient Medications  Medication Sig Dispense Refill   acetaminophen (TYLENOL) 325 MG tablet Take 650 mg by mouth every 4 (four) hours as needed for Pain     albuterol MDI, PROVENTIL, VENTOLIN, PROAIR, HFA 90 mcg/actuation inhaler as needed      ascorbic acid, vitamin C, (VITAMIN C) 500 MG tablet Take 500 mg by mouth once daily     benztropine (COGENTIN) 0.5 MG tablet      cholecalciferol, vitamin D3, (VITAMIN D3) 125 mcg (5,000 unit) tablet Take 5,000 Units by mouth once daily     cloZAPine (CLOZARIL) 100 MG tablet Take 250 mg by mouth at bedtime     docusate (COLACE) 100 MG capsule Take 100 mg by mouth 2 (two) times daily     ferrous sulfate 325 (65 FE) MG EC tablet Take 325 mg by mouth daily with breakfast     hydrOXYzine (VISTARIL) 50 MG capsule      ibuprofen (MOTRIN) 600 MG tablet Take 600 mg by mouth every 6 (six) hours as needed for Pain     INVEGA SUSTENNA 234 mg/1.5 mL IM syringe      levothyroxine (SYNTHROID) 25 MCG tablet      lidocaine (LIDODERM) 5 % patch Place 1 patch onto the skin daily Apply patch to the most painful area for up to 12 hours in a 24 hour period.     lithium carbonate 300 mg tablet Take 300 mg by mouth 2 (two) times daily At night dose also takes 2 tablets of 450 mg along with the 300 mg     OLANZapine (ZYPREXA ZYDIS) 20 MG disintegrating tablet      oxyBUTYnin (DITROPAN XL) 15 MG XL tablet once daily     pantoprazole (PROTONIX) 40 MG DR tablet      rosuvastatin (CRESTOR) 20 MG tablet      sennosides (SENOKOT) 8.6 mg tablet Take 1 tablet  by mouth once daily     sertraline (ZOLOFT) 100 MG tablet      traZODone (DESYREL) 100 MG tablet 50 mg     polyethylene glycol (MIRALAX) powder Take 17 g by mouth once daily Mix in 4-8ounces of fluid prior to taking. (Patient not taking: Reported on 01/20/2023)     No current facility-administered medications for this visit.    ALLERGIES: Patient has no known allergies.  PAST MEDICAL HISTORY: Schizoaffective schizophrenia Intellectual disability  PAST SURGICAL HISTORY: Exploratory laparotomy 07/11/2022  FAMILY HISTORY: Family history reviewed.  No pertinent family history  SOCIAL HISTORY: Social History   Socioeconomic History   Marital status:  Single  Tobacco Use   Smoking status: Every Day    Current packs/day: 0.50    Types: Cigarettes   Smokeless tobacco: Never  Vaping Use   Vaping status: Never Used  Substance and Sexual Activity   Alcohol use: Never   Drug use: Never    PHYSICAL EXAM: Vitals:   01/20/23 1346  BP: 117/76  Pulse: 91   Body mass index is 32.71 kg/m. Weight: (!) 103.4 kg (228 lb)   GENERAL: Alert, active, oriented x3  HEENT: Pupils equal reactive to light. Extraocular movements are intact. Sclera clear. Palpebral conjunctiva normal red color.Pharynx clear.  NECK: Supple with no palpable mass and no adenopathy.  LUNGS: Sound clear with no rales rhonchi or wheezes.  HEART: Regular rhythm S1 and S2 without murmur.  ABDOMEN: Soft and depressible, nontender with no palpable mass, no hepatomegaly.  Reducible bulge in the epigastric area.  EXTREMITIES: Well-developed well-nourished symmetrical with no dependent edema.  NEUROLOGICAL: Awake alert oriented, facial expression symmetrical, moving all extremities.  REVIEW OF DATA: I have reviewed the following data today: No visits with results within 3 Month(s) from this visit.  Latest known visit with results is:  No results found for any previous visit.  Last CBC checked on: Epic done on January 07, 2023 shows white blood cell count of 7.3 and hemoglobin of 13.2 with adequate platelets.  CMP with adequate electrolyte and normal renal function.  ASSESSMENT: Lawrence Morgan is a 42 y.o. male presenting for consultation for incisional hernia.    The patient presents with a symptomatic incisional hernia. Patient was oriented about the diagnosis of incisional hernia and its implication. The patient was oriented about the treatment alternatives (observation vs surgical repair). Due to patient symptoms, repair is recommended. Patient oriented about the surgical procedure, the use of mesh and its risk of complications such as: infection, bleeding, injury to  vasculature, injury to bowel or bladder, and chronic pain, intestinal obstruction, among others.   Incisional hernia, without obstruction or gangrene [K43.2]  PLAN: Robotic assisted laparoscopic incisional hernia repair with mesh (16109) Avoid aspirin or any blood thinner 5 days before the surgery Contact us if you have any concern.   Patient verbalized understanding, all questions were answered, and were agreeable with the plan outlined above.    Carolan Shiver, MD

## 2023-01-25 ENCOUNTER — Other Ambulatory Visit: Payer: Self-pay

## 2023-01-25 ENCOUNTER — Encounter
Admission: RE | Admit: 2023-01-25 | Discharge: 2023-01-25 | Disposition: A | Discharge status: Home / Self Care | Payer: Medicare Other | Source: Ambulatory Visit | Attending: General Surgery | Admitting: General Surgery

## 2023-01-25 NOTE — Pre-Procedure Instructions (Signed)
PAT call completed with group home care giver Asher Muir, instructions fax to [facility], they have been reviewed, all questions answered.

## 2023-01-25 NOTE — Patient Instructions (Addendum)
Your procedure is scheduled on: 01/28/23 - Friday Report to the Registration Desk on the 1st floor of the [facility]. To find out your arrival time, please call [phone] between 1PM - 3PM on: 01/27/23 - Thursday If your arrival time is 6:00 am, do not arrive before that time as the [facility] entrance doors do not open until 6:00 am.  REMEMBER: Instructions that are not followed completely may result in serious medical risk, up to and including death; or upon the discretion of your surgeon and anesthesiologist your surgery may need to be rescheduled.  Do not eat food or drink any liquids after midnight the night before surgery.  No gum chewing or hard candies.  One week prior to surgery: Stop Anti-inflammatories (NSAIDS) such as Advil, Aleve, Ibuprofen, Motrin, Naproxen, Naprosyn and Aspirin based products such as Excedrin, Goody's Powder, BC Powder. You may however, continue to take Tylenol if needed for pain up until the day of surgery.  Stop ANY OVER THE COUNTER supplements until after surgery.   TAKE ONLY THESE MEDICATIONS THE MORNING OF SURGERY WITH A SIP OF WATER:  pantoprazole (PROTONIX) - (take one the night before and one on the morning of surgery - helps to prevent nausea after surgery.) benztropine (COGENTIN)  levothyroxine (SYNTHROID)  lithium  sertraline (ZOLOFT)  hydrOXYzine (ATARAX) if needed  Use inhalers albuterol  on the day of surgery and bring to the hospital.  No Alcohol for 24 hours before or after surgery.  No Smoking including e-cigarettes for 24 hours before surgery.  No chewable tobacco products for at least 6 hours before surgery.  No nicotine patches on the day of surgery.  Do not use any "recreational" drugs for at least a week (preferably 2 weeks) before your surgery.  Please be advised that the combination of cocaine and anesthesia may have negative outcomes, up to and including death. If you test positive for cocaine, your surgery will be  cancelled.  On the morning of surgery brush your teeth with toothpaste and water, you may rinse your mouth with mouthwash if you wish. Do not swallow any toothpaste or mouthwash.  Use CHG Soap or wipes as directed on instruction sheet.  Do not wear jewelry, make-up, hairpins, clips or nail polish.  Do not wear lotions, powders, or perfumes.   Do not shave body hair from the neck down 48 hours before surgery.  Contact lenses, hearing aids and dentures may not be worn into surgery.  Do not bring valuables to the hospital. [facility] is not responsible for any missing/lost belongings or valuables.   Notify your doctor if there is any change in your medical condition (cold, fever, infection).  Wear comfortable clothing (specific to your surgery type) to the hospital.  After surgery, you can help prevent lung complications by doing breathing exercises.  Take deep breaths and cough every 1-2 hours. Your doctor may order a device called an Incentive Spirometer to help you take deep breaths. When coughing or sneezing, hold a pillow firmly against your incision with both hands. This is called "splinting." Doing this helps protect your incision. It also decreases belly discomfort.  If you are being admitted to the hospital overnight, leave your suitcase in the car. After surgery it may be brought to your room.  In case of increased patient census, it may be necessary for you, the patient, to continue your postoperative care in the Same Day Surgery department.  If you are being discharged the day of surgery, you  will not be allowed to drive home. You will need a responsible individual to drive you home and stay with you for 24 hours after surgery.   If you are taking public transportation, you will need to have a responsible individual with you.  Please call the Pre-admissions Testing Dept. at [phone] if you have any questions about these instructions.  Surgery Visitation  Policy:  Patients having surgery or a procedure may have two visitors.  Children under the age of 3 must have an adult with them who is not the patient.  Inpatient Visitation:    Visiting hours are 7 a.m. to 8 p.m. Up to four visitors are allowed at one time in a patient room. The visitors may rotate out with other people during the day.  One visitor age 63 or older may stay with the patient overnight and must be in the room by 8 p.m.    Preparing for Surgery with CHLORHEXIDINE GLUCONATE (CHG) Soap  Chlorhexidine Gluconate (CHG) Soap  o An antiseptic cleaner that kills germs and bonds with the skin to continue killing germs even after washing  o Used for showering the night before surgery and morning of surgery  Before surgery, you can play an important role by reducing the number of germs on your skin.  CHG (Chlorhexidine gluconate) soap is an antiseptic cleanser which kills germs and bonds with the skin to continue killing germs even after washing.  Please do not use if you have an allergy to CHG or antibacterial soaps. If your skin becomes reddened/irritated stop using the CHG.  1. Shower the NIGHT BEFORE SURGERY and the MORNING OF SURGERY with CHG soap.  2. If you choose to wash your hair, wash your hair first as usual with your normal shampoo.  3. After shampooing, rinse your hair and body thoroughly to remove the shampoo.  4. Use CHG as you would any other liquid soap. You can apply CHG directly to the skin and wash gently with a scrungie or a clean washcloth.  5. Apply the CHG soap to your body only from the neck down. Do not use on open wounds or open sores. Avoid contact with your eyes, ears, mouth, and genitals (private parts). Wash face and genitals (private parts) with your normal soap.  6. Wash thoroughly, paying special attention to the area where your surgery will be performed.  7. Thoroughly rinse your body with warm water.  8. Do not shower/wash with your  normal soap after using and rinsing off the CHG soap.  9. Pat yourself dry with a clean towel.  10. Wear clean pajamas to bed the night before surgery.  12. Place clean sheets on your bed the night of your first shower and do not sleep with pets.  13. Shower again with the CHG soap on the day of surgery prior to arriving at the hospital.  14. Do not apply any deodorants/lotions/powders.  15. Please wear clean clothes to the hospital.

## 2023-01-27 MED ORDER — GABAPENTIN 300 MG PO CAPS
300.0000 mg | ORAL_CAPSULE | ORAL | Status: AC
  Administered 2023-01-28: 300 mg via ORAL

## 2023-01-27 MED ORDER — ORAL CARE MOUTH RINSE: 15.0000 mL | Freq: Once | OROMUCOSAL | Status: AC

## 2023-01-27 MED ORDER — CHLORHEXIDINE GLUCONATE 0.12 % MT SOLN
15.0000 mL | Freq: Once | OROMUCOSAL | Status: AC
  Administered 2023-01-28: 15 mL via OROMUCOSAL

## 2023-01-27 MED ORDER — BUPIVACAINE LIPOSOME 1.3 % IJ SUSP: 20.0000 mL | Freq: Once | INTRAMUSCULAR | Status: DC

## 2023-01-27 MED ORDER — ACETAMINOPHEN 500 MG PO TABS
1000.0000 mg | ORAL_TABLET | ORAL | Status: AC
  Administered 2023-01-28: 1000 mg via ORAL

## 2023-01-27 MED ORDER — CEFAZOLIN SODIUM-DEXTROSE 2-4 GM/100ML-% IV SOLN
2.0000 g | INTRAVENOUS | Status: AC
  Administered 2023-01-28: 2 g via INTRAVENOUS

## 2023-01-27 MED ORDER — LACTATED RINGERS IV SOLN: INTRAVENOUS | Status: DC

## 2023-01-27 MED ORDER — CELECOXIB 200 MG PO CAPS
200.0000 mg | ORAL_CAPSULE | ORAL | Status: AC
  Administered 2023-01-28: 200 mg via ORAL

## 2023-01-28 ENCOUNTER — Other Ambulatory Visit: Payer: Self-pay

## 2023-01-28 ENCOUNTER — Encounter
Admission: RE | Disposition: A | Discharge status: Home / Self Care | Payer: Self-pay | Source: Home / Self Care | Attending: General Surgery

## 2023-01-28 ENCOUNTER — Encounter: Payer: Self-pay | Admitting: General Surgery

## 2023-01-28 ENCOUNTER — Ambulatory Visit: Payer: Medicare Other | Admitting: Certified Registered"

## 2023-01-28 ENCOUNTER — Ambulatory Visit
Admission: RE | Admit: 2023-01-28 | Discharge: 2023-01-28 | Disposition: A | Discharge status: Home / Self Care | Payer: Medicare Other | Attending: General Surgery | Admitting: General Surgery

## 2023-01-28 DIAGNOSIS — K219 Gastro-esophageal reflux disease without esophagitis: Secondary | ICD-10-CM | POA: Diagnosis not present

## 2023-01-28 DIAGNOSIS — E039 Hypothyroidism, unspecified: Secondary | ICD-10-CM | POA: Diagnosis not present

## 2023-01-28 DIAGNOSIS — Z593 Problems related to living in residential institution: Secondary | ICD-10-CM | POA: Insufficient documentation

## 2023-01-28 DIAGNOSIS — F70 Mild intellectual disabilities: Secondary | ICD-10-CM | POA: Insufficient documentation

## 2023-01-28 DIAGNOSIS — F419 Anxiety disorder, unspecified: Secondary | ICD-10-CM | POA: Insufficient documentation

## 2023-01-28 DIAGNOSIS — K430 Incisional hernia with obstruction, without gangrene: Secondary | ICD-10-CM | POA: Insufficient documentation

## 2023-01-28 DIAGNOSIS — F250 Schizoaffective disorder, bipolar type: Secondary | ICD-10-CM | POA: Insufficient documentation

## 2023-01-28 DIAGNOSIS — F4310 Post-traumatic stress disorder, unspecified: Secondary | ICD-10-CM | POA: Insufficient documentation

## 2023-01-28 DIAGNOSIS — K5900 Constipation, unspecified: Secondary | ICD-10-CM | POA: Insufficient documentation

## 2023-01-28 DIAGNOSIS — F17210 Nicotine dependence, cigarettes, uncomplicated: Secondary | ICD-10-CM | POA: Diagnosis not present

## 2023-01-28 HISTORY — PX: XI ROBOTIC ASSISTED INGUINAL HERNIA REPAIR WITH MESH: SHX6706

## 2023-01-28 HISTORY — PX: XI ROBOTIC ASSISTED VENTRAL HERNIA: SHX6789

## 2023-01-28 SURGERY — REPAIR, HERNIA, VENTRAL, ROBOT-ASSISTED
Anesthesia: General | Site: Abdomen

## 2023-01-28 MED ORDER — CELECOXIB 200 MG PO CAPS
ORAL_CAPSULE | ORAL | Status: AC
  Filled 2023-01-28: qty 1

## 2023-01-28 MED ORDER — DEXAMETHASONE SODIUM PHOSPHATE 10 MG/ML IJ SOLN
INTRAMUSCULAR | Status: DC | PRN
  Administered 2023-01-28: 10 mg via INTRAVENOUS

## 2023-01-28 MED ORDER — OXYCODONE HCL 5 MG/5ML PO SOLN: 5.0000 mg | Freq: Once | ORAL | Status: DC | PRN

## 2023-01-28 MED ORDER — BUPIVACAINE LIPOSOME 1.3 % IJ SUSP
INTRAMUSCULAR | Status: AC
  Filled 2023-01-28: qty 20

## 2023-01-28 MED ORDER — CHLORHEXIDINE GLUCONATE 0.12 % MT SOLN
OROMUCOSAL | Status: AC
  Filled 2023-01-28: qty 15

## 2023-01-28 MED ORDER — OXYCODONE-ACETAMINOPHEN 5-325 MG PO TABS: 1.0000 | ORAL_TABLET | ORAL | 0 refills | Status: AC | PRN

## 2023-01-28 MED ORDER — BUPIVACAINE-EPINEPHRINE (PF) 0.25% -1:200000 IJ SOLN
INTRAMUSCULAR | Status: AC
  Filled 2023-01-28: qty 30

## 2023-01-28 MED ORDER — LIDOCAINE HCL (CARDIAC) PF 100 MG/5ML IV SOSY
PREFILLED_SYRINGE | INTRAVENOUS | Status: DC | PRN
  Administered 2023-01-28 (×2): 100 mg via INTRAVENOUS

## 2023-01-28 MED ORDER — CEFAZOLIN SODIUM-DEXTROSE 2-4 GM/100ML-% IV SOLN
INTRAVENOUS | Status: AC
  Filled 2023-01-28: qty 100

## 2023-01-28 MED ORDER — PROMETHAZINE HCL 25 MG/ML IJ SOLN: 6.2500 mg | INTRAMUSCULAR | Status: DC | PRN

## 2023-01-28 MED ORDER — ROCURONIUM BROMIDE 100 MG/10ML IV SOLN
INTRAVENOUS | Status: DC | PRN
  Administered 2023-01-28: 20 mg via INTRAVENOUS
  Administered 2023-01-28: 60 mg via INTRAVENOUS
  Administered 2023-01-28: 20 mg via INTRAVENOUS

## 2023-01-28 MED ORDER — ACETAMINOPHEN 500 MG PO TABS
ORAL_TABLET | ORAL | Status: AC
  Filled 2023-01-28: qty 2

## 2023-01-28 MED ORDER — GABAPENTIN 300 MG PO CAPS
ORAL_CAPSULE | ORAL | Status: AC
  Filled 2023-01-28: qty 1

## 2023-01-28 MED ORDER — METOPROLOL TARTRATE 5 MG/5ML IV SOLN
INTRAVENOUS | Status: DC | PRN
Start: 2023-01-28 — End: 2023-01-28
  Administered 2023-01-28: 1 mg via INTRAVENOUS
  Administered 2023-01-28: 2 mg via INTRAVENOUS
  Administered 2023-01-28 (×2): 1 mg via INTRAVENOUS

## 2023-01-28 MED ORDER — GLYCOPYRROLATE 0.2 MG/ML IJ SOLN
INTRAMUSCULAR | Status: DC | PRN
Start: 2023-01-28 — End: 2023-01-28
  Administered 2023-01-28: .2 mg via INTRAVENOUS

## 2023-01-28 MED ORDER — PROPOFOL 10 MG/ML IV BOLUS
INTRAVENOUS | Status: DC | PRN
  Administered 2023-01-28: 170 mg via INTRAVENOUS

## 2023-01-28 MED ORDER — OXYCODONE HCL 5 MG PO TABS: 5.0000 mg | ORAL_TABLET | Freq: Once | ORAL | Status: DC | PRN

## 2023-01-28 MED ORDER — ACETAMINOPHEN 10 MG/ML IV SOLN: 1000.0000 mg | Freq: Once | INTRAVENOUS | Status: DC | PRN

## 2023-01-28 MED ORDER — SUGAMMADEX SODIUM 200 MG/2ML IV SOLN
INTRAVENOUS | Status: DC | PRN
  Administered 2023-01-28 (×2): 100 mg via INTRAVENOUS
  Administered 2023-01-28: 200 mg via INTRAVENOUS

## 2023-01-28 MED ORDER — IBUPROFEN 800 MG PO TABS: 800.0000 mg | ORAL_TABLET | Freq: Three times a day (TID) | ORAL | 0 refills | Status: AC

## 2023-01-28 MED ORDER — DEXMEDETOMIDINE HCL IN NACL 80 MCG/20ML IV SOLN
INTRAVENOUS | Status: DC | PRN
  Administered 2023-01-28: 20 ug via INTRAVENOUS

## 2023-01-28 MED ORDER — FENTANYL CITRATE (PF) 250 MCG/5ML IJ SOLN
INTRAMUSCULAR | Status: AC
  Filled 2023-01-28: qty 5

## 2023-01-28 MED ORDER — MIDAZOLAM HCL 2 MG/2ML IJ SOLN
INTRAMUSCULAR | Status: AC
  Filled 2023-01-28: qty 2

## 2023-01-28 MED ORDER — GABAPENTIN 300 MG PO CAPS: 300.0000 mg | ORAL_CAPSULE | Freq: Three times a day (TID) | ORAL | 0 refills | Status: AC

## 2023-01-28 MED ORDER — ONDANSETRON HCL 4 MG/2ML IJ SOLN
INTRAMUSCULAR | Status: DC | PRN
  Administered 2023-01-28: 4 mg via INTRAVENOUS

## 2023-01-28 MED ORDER — FENTANYL CITRATE (PF) 100 MCG/2ML IJ SOLN: 25.0000 ug | INTRAMUSCULAR | Status: DC | PRN

## 2023-01-28 MED ORDER — MIDAZOLAM HCL 2 MG/2ML IJ SOLN
INTRAMUSCULAR | Status: DC | PRN
  Administered 2023-01-28 (×2): 1 mg via INTRAVENOUS

## 2023-01-28 MED ORDER — BUPIVACAINE LIPOSOME 1.3 % IJ SUSP
INTRAMUSCULAR | Status: DC | PRN
Start: 2023-01-28 — End: 2023-01-28
  Administered 2023-01-28: 20 mL

## 2023-01-28 MED ORDER — BUPIVACAINE-EPINEPHRINE (PF) 0.25% -1:200000 IJ SOLN
INTRAMUSCULAR | Status: DC | PRN
  Administered 2023-01-28: 30 mL

## 2023-01-28 MED ORDER — DROPERIDOL 2.5 MG/ML IJ SOLN: 0.6250 mg | Freq: Once | INTRAMUSCULAR | Status: DC | PRN

## 2023-01-28 MED ORDER — FENTANYL CITRATE (PF) 100 MCG/2ML IJ SOLN
INTRAMUSCULAR | Status: DC | PRN
  Administered 2023-01-28: 150 ug via INTRAVENOUS
  Administered 2023-01-28: 50 ug via INTRAVENOUS

## 2023-01-28 MED ORDER — PHENYLEPHRINE HCL (PRESSORS) 10 MG/ML IV SOLN
INTRAVENOUS | Status: DC | PRN
Start: 2023-01-28 — End: 2023-01-28
  Administered 2023-01-28: 160 ug via INTRAVENOUS

## 2023-01-28 SURGICAL SUPPLY — 49 items
ADH SKN CLS APL DERMABOND .7 (GAUZE/BANDAGES/DRESSINGS) ×1
BAG PRESSURE INF REUSE 1000 (BAG) IMPLANT
BLADE SURG SZ11 CARB STEEL (BLADE) ×1 IMPLANT
COVER TIP SHEARS 8 DVNC (MISCELLANEOUS) ×1 IMPLANT
COVER WAND RF STERILE (DRAPES) ×1 IMPLANT
DERMABOND ADVANCED .7 DNX12 (GAUZE/BANDAGES/DRESSINGS) ×1 IMPLANT
DRAPE ARM DVNC X/XI (DISPOSABLE) ×3 IMPLANT
DRAPE COLUMN DVNC XI (DISPOSABLE) ×1 IMPLANT
ELECT REM PT RETURN 9FT ADLT (ELECTROSURGICAL) ×1
ELECTRODE REM PT RTRN 9FT ADLT (ELECTROSURGICAL) ×1 IMPLANT
FORCEPS BPLR R/ABLATION 8 DVNC (INSTRUMENTS) ×1 IMPLANT
GLOVE BIO SURGEON STRL SZ 6.5 (GLOVE) ×2 IMPLANT
GLOVE BIOGEL PI IND STRL 6.5 (GLOVE) ×2 IMPLANT
GOWN STRL REUS W/ TWL LRG LVL3 (GOWN DISPOSABLE) ×3 IMPLANT
GOWN STRL REUS W/TWL LRG LVL3 (GOWN DISPOSABLE) ×4
IRRIGATOR SUCT 8 DISP DVNC XI (IRRIGATION / IRRIGATOR) IMPLANT
IV CATH ANGIO 12GX3 LT BLUE (NEEDLE) IMPLANT
IV NS 1000ML (IV SOLUTION)
IV NS 1000ML BAXH (IV SOLUTION) IMPLANT
KIT PINK PAD W/HEAD ARE REST (MISCELLANEOUS) ×1
KIT PINK PAD W/HEAD ARM REST (MISCELLANEOUS) ×1 IMPLANT
LABEL OR SOLS (LABEL) ×1 IMPLANT
MANIFOLD NEPTUNE II (INSTRUMENTS) ×1 IMPLANT
MESH PROGRIP HERNIA FLAT 15X15 (Mesh General) IMPLANT
NDL DRIVE SUT CUT DVNC (INSTRUMENTS) ×1 IMPLANT
NDL HYPO 22X1.5 SAFETY MO (MISCELLANEOUS) ×1 IMPLANT
NDL INSUFFLATION 14GA 120MM (NEEDLE) ×1 IMPLANT
NEEDLE DRIVE SUT CUT DVNC (INSTRUMENTS) ×1
NEEDLE HYPO 22X1.5 SAFETY MO (MISCELLANEOUS) ×1
NEEDLE INSUFFLATION 14GA 120MM (NEEDLE) ×1
NS IRRIG 500ML POUR BTL (IV SOLUTION) ×1 IMPLANT
OBTURATOR OPTICAL STND 8 DVNC (TROCAR) ×1
OBTURATOR OPTICALSTD 8 DVNC (TROCAR) ×1 IMPLANT
PACK LAP CHOLECYSTECTOMY (MISCELLANEOUS) ×1 IMPLANT
SCISSORS MNPLR CVD DVNC XI (INSTRUMENTS) ×1 IMPLANT
SEAL UNIV 5-12 XI (MISCELLANEOUS) ×3 IMPLANT
SET TUBE SMOKE EVAC HIGH FLOW (TUBING) ×1 IMPLANT
SOL ELECTROSURG ANTI STICK (MISCELLANEOUS) ×1
SOLUTION ELECTROSURG ANTI STCK (MISCELLANEOUS) ×1 IMPLANT
SUT MNCRL 4-0 (SUTURE) ×1
SUT MNCRL 4-0 27XMFL (SUTURE) ×1
SUT STRATAFIX PDS 30 CT-1 (SUTURE) ×1 IMPLANT
SUT VICRYL 0 UR6 27IN ABS (SUTURE) ×1 IMPLANT
SUT VLOC 90 2/L VL 12 GS22 (SUTURE) ×2 IMPLANT
SUT VLOC 90 S/L VL9 GS22 (SUTURE) IMPLANT
SUTURE MNCRL 4-0 27XMF (SUTURE) ×1 IMPLANT
TAPE TRANSPORE STRL 2 31045 (GAUZE/BANDAGES/DRESSINGS) ×1 IMPLANT
TRAP FLUID SMOKE EVACUATOR (MISCELLANEOUS) ×1 IMPLANT
WATER STERILE IRR 500ML POUR (IV SOLUTION) ×1 IMPLANT

## 2023-01-28 NOTE — Anesthesia Procedure Notes (Signed)
Procedure Name: Intubation Date/Time: 01/28/2023 12:03 PM  Performed by: Karoline Caldwell, CRNAPre-anesthesia Checklist: Patient identified, Patient being monitored, Timeout performed, Emergency Drugs available and Suction available Patient Re-evaluated:Patient Re-evaluated prior to induction Oxygen Delivery Method: Circle system utilized Preoxygenation: Pre-oxygenation with 100% oxygen Induction Type: IV induction Ventilation: Mask ventilation without difficulty Laryngoscope Size: Mac and 4 Grade View: Grade I Tube type: Oral Tube size: 7.5 mm Number of attempts: 1 Airway Equipment and Method: Stylet Placement Confirmation: ETT inserted through vocal cords under direct vision, positive ETCO2 and breath sounds checked- equal and bilateral Secured at: 22 cm Tube secured with: Tape Dental Injury: Teeth and Oropharynx as per pre-operative assessment

## 2023-01-28 NOTE — Transfer of Care (Signed)
Immediate Anesthesia Transfer of Care Note  Patient: Lawrence Morgan  Procedure(s) Performed: XI ROBOTIC ASSISTED VENTRAL HERNIA (Abdomen) XI ROBOTIC ASSISTED INGUINAL HERNIA REPAIR WITH MESH (Abdomen)  Patient Location: PACU  Anesthesia Type:General  Level of Consciousness: sedated  Airway & Oxygen Therapy: Patient Spontanous Breathing and Patient connected to face mask oxygen  Post-op Assessment: Report given to RN and Post -op Vital signs reviewed and stable  Post vital signs: stable  Last Vitals:  Vitals Value Taken Time  BP 131/82 01/28/23 1445  Temp 36.3 C 01/28/23 1444  Pulse 86 01/28/23 1449  Resp 36 01/28/23 1449  SpO2 96 % 01/28/23 1449  Vitals shown include unfiled device data.  Last Pain:  Vitals:   01/28/23 1117  TempSrc: Temporal  PainSc: 0-No pain         Complications: No notable events documented.

## 2023-01-28 NOTE — Discharge Instructions (Addendum)
  Diet: Resume home heart healthy regular diet.   Activity: No heavy lifting >20 pounds (children, pets, laundry, garbage) or strenuous activity until follow-up, but light activity and walking are encouraged. Do not drive or drink alcohol if taking narcotic pain medications.  Wound care: May shower with soapy water and pat dry (do not rub incisions), but no baths or submerging incision underwater until follow-up. (no swimming)   Medications: Resume all home medications. For mild to moderate pain: acetaminophen (Tylenol) or ibuprofen (if no kidney disease). Combining Tylenol with alcohol can substantially increase your risk of causing liver disease. Narcotic pain medications, if prescribed, can be used for severe pain, though may cause nausea, constipation, and drowsiness. If you do not need the narcotic pain medication, you do not need to fill the prescription.  Take Miralax up to 2 times per day to avoid constipation and straining.   Call office [phone]) at any time if any questions, worsening pain, fevers/chills, bleeding, drainage from incision site, or other concerns.   AMBULATORY SURGERY  DISCHARGE INSTRUCTIONS   The drugs that you were given will stay in your system until tomorrow so for the next 24 hours you should not:  Drive an automobile Make any legal decisions Drink any alcoholic beverage   You may resume regular meals tomorrow.  Today it is better to start with liquids and gradually work up to solid foods.  You may eat anything you prefer, but it is better to start with liquids, then soup and crackers, and gradually work up to solid foods.   Please notify your doctor immediately if you have any unusual bleeding, trouble breathing, redness and pain at the surgery site, drainage, fever, or pain not relieved by medication.    Additional Instructions:        Please contact your physician with any problems or Same Day Surgery at [phone], Monday through  Friday 6 am to 4 pm, or [place] [facility] number at [phone].

## 2023-01-28 NOTE — Anesthesia Preprocedure Evaluation (Signed)
Anesthesia Evaluation  Patient identified by MRN, date of birth, ID band Patient awake    Reviewed: Allergy & Precautions, H&P , NPO status , Patient's Chart, lab work & pertinent test results, reviewed documented beta blocker date and time   Airway Mallampati: II  TM Distance: >3 FB Neck ROM: full    Dental  (+) Teeth Intact   Pulmonary neg pulmonary ROS, Current Smoker   Pulmonary exam normal        Cardiovascular Exercise Tolerance: Good negative cardio ROS Normal cardiovascular exam Rhythm:regular Rate:Normal     Neuro/Psych  PSYCHIATRIC DISORDERS Anxiety  Bipolar Disorder Schizophrenia  negative neurological ROS     GI/Hepatic Neg liver ROS,GERD  Medicated,,  Endo/Other  Hypothyroidism    Renal/GU negative Renal ROS  negative genitourinary   Musculoskeletal   Abdominal   Peds  Hematology negative hematology ROS (+)   Anesthesia Other Findings Past Medical History: No date: Chronic constipation No date: Foreign body ingestion, initial encounter No date: GERD (gastroesophageal reflux disease) No date: Hypothyroidism No date: Intellectual disability     Comment:  mild No date: Marijuana use No date: Periumbilical abdominal pain No date: Personality disorder (HCC) No date: PTSD (post-traumatic stress disorder) 04/29/2018: Schizo-affective schizophrenia (HCC) No date: Schizoaffective disorder, bipolar type (HCC) No date: Ventral hernia without obstruction or gangrene Past Surgical History: 07/11/2022: ESOPHAGOGASTRODUODENOSCOPY; N/A     Comment:  Procedure: ESOPHAGOGASTRODUODENOSCOPY (EGD);  Surgeon:               Toney Reil, MD;  Location: [facility] ENDOSCOPY;                Service: Gastroenterology;  Laterality: N/A; 04/30/2018: ESOPHAGOGASTRODUODENOSCOPY (EGD) WITH PROPOFOL; N/A     Comment:  Procedure: ESOPHAGOGASTRODUODENOSCOPY (EGD) WITH               PROPOFOL;  Surgeon: Kathi Der, MD;   Location: [facility]               ENDOSCOPY;  Service: Gastroenterology;  Laterality: N/A; 01/20/2021: ESOPHAGOGASTRODUODENOSCOPY (EGD) WITH PROPOFOL; N/A     Comment:  Procedure: ESOPHAGOGASTRODUODENOSCOPY (EGD) WITH               PROPOFOL;  Surgeon: Midge Minium, MD;  Location: [facility]               ENDOSCOPY;  Service: Endoscopy;  Laterality: N/A; 04/30/2018: FOREIGN BODY REMOVAL; N/A     Comment:  Procedure: FOREIGN BODY REMOVAL;  Surgeon: Kathi Der, MD;  Location: [facility] ENDOSCOPY;  Service:               Gastroenterology;  Laterality: N/A; No date: FRACTURE SURGERY     Comment:   r ankle  07/11/2022: LAPAROTOMY; N/A     Comment:  Procedure: EXPLORATORY LAPAROTOMY;  Surgeon:               Carolan Shiver, MD;  Location: [facility] ORS;  Service:              General;  Laterality: N/A;   Reproductive/Obstetrics negative OB ROS                             Anesthesia Physical Anesthesia Plan  ASA: 2  Anesthesia Plan: General ETT   Post-op Pain Management:    Induction:   PONV Risk Score and Plan:   Airway Management  Planned:   Additional Equipment:   Intra-op Plan:   Post-operative Plan:   Informed Consent: I have reviewed the patients History and Physical, chart, labs and discussed the procedure including the risks, benefits and alternatives for the proposed anesthesia with the patient or authorized representative who has indicated his/her understanding and acceptance.     Dental Advisory Given  Plan Discussed with: CRNA  Anesthesia Plan Comments:        Anesthesia Quick Evaluation

## 2023-01-28 NOTE — Op Note (Addendum)
Preoperative diagnosis: Incisional Hernia  Postoperative diagnosis: Incisional Incarcerated Hernia  Procedure: Robotic assisted laparoscopic Incisional incarcerated hernia repair with bilateral myocutaneous flaps and mesh  Anesthesia: General  Surgeon: Dr. Hazle Quant  Wound Classification: Clean  Specimen: None  Complications: None  Estimated Blood Loss: 10ml  Indications: Patient is a 42 y.o. male developed an incisional hernia. This was incarcerated and repair was indicated.   Findings: 5 cm incisional incarcerated epigastric hernia 2. Repair achieved with closure of the anterior fascia after bilateral myocutaneous flaps at midline and 15 x 15 cm Progrip mesh 3. Adequate hemostasis         Description of procedure: The patient was brought to the operating room and general anesthesia was induced. A time-out was completed verifying correct patient, procedure, site, positioning, and implant(s) and/or special equipment prior to beginning this procedure. Antibiotics were administered prior to making the incision. SCDs placed. The anterior abdominal wall was prepped and draped in the standard sterile fashion.   Infra umbilical point chosen for entry.  Veress needle placed and abdomen insufflated to 15cm without any dramatic increase in pressure.  Needle removed and optiview technique used to place 8mm port at same point.  No injury noted during placement. Exparel was infused in a TAP block. Two additional ports, 8mm x2 along lower abdomen placed.  Xi robot then docked into place.  Hernia contents noted and reduced with combination of blunt, sharp dissection with scissors and fenestrated forceps.  Hemostasis achieved throughout this portion.  Once all hernia contents reduced, there was noted to be a 5 x 5 cm hernia.  The posterior fascia was opened 6 cm caudal to the lower edge of the hernia defect. Bilateral myocutaneous flaps were developed in the retrorectus plane 6 cm away of the  hernia defect circumferentially.   Insufflation dropped to 8 mmHg and transfacial suture with 0 stratafix used to primarily close defect under minimal tension. A 15 cm x 15 cm Progrip mesh was used. The posterior fascia and any defect of the hernia sac was closed with 2-0 V loc.   Robot was undocked.  Abdomen then desufflated while camera within abdomen to ensure no signs of new bleed prior to removing camera and rest of ports completely.  All skin incisions closed with runninrg 4-0 Monocryl in a subcuticular fashion.  All wounds then dressed with Dermabond.  Patient was then successfully awakened and transferred to PACU in stable condition.  At the end of the procedure sponge and instrument counts were correct.

## 2023-01-28 NOTE — Interval H&P Note (Signed)
History and Physical Interval Note:  01/28/2023 11:20 AM  Lawrence Morgan  has presented today for surgery, with the diagnosis of K43.2 Incisional hernia w/o obstruction of gangrene.  The various methods of treatment have been discussed with the patient and family. After consideration of risks, benefits and other options for treatment, the patient has consented to  Procedure(s): XI ROBOTIC ASSISTED VENTRAL HERNIA (N/A) as a surgical intervention.  The patient's history has been reviewed, patient examined, no change in status, stable for surgery.  I have reviewed the patient's chart and labs.  Questions were answered to the patient's satisfaction.     Carolan Shiver

## 2023-01-29 ENCOUNTER — Encounter: Payer: Self-pay | Admitting: General Surgery

## 2023-02-01 NOTE — Anesthesia Postprocedure Evaluation (Signed)
Anesthesia Post Note  Patient: Lawrence Morgan  Procedure(s) Performed: XI ROBOTIC ASSISTED VENTRAL HERNIA (Abdomen) XI ROBOTIC ASSISTED INGUINAL HERNIA REPAIR WITH MESH (Abdomen)  Patient location during evaluation: PACU Anesthesia Type: General Level of consciousness: awake and alert Pain management: pain level controlled Vital Signs Assessment: post-procedure vital signs reviewed and stable Respiratory status: spontaneous breathing, nonlabored ventilation, respiratory function stable and patient connected to nasal cannula oxygen Cardiovascular status: blood pressure returned to baseline and stable Postop Assessment: no apparent nausea or vomiting Anesthetic complications: no   There were no known notable events for this encounter.   Last Vitals:  Vitals:   01/28/23 1545 01/28/23 1603  BP: 131/86 132/86  Pulse: 83 84  Resp: 20 20  Temp: (!) 36.1 C (!) 36.2 C  SpO2: 100% 100%    Last Pain:  Vitals:   01/28/23 1603  TempSrc: Temporal  PainSc: 0-No pain                 Yevette Edwards

## 2023-09-28 ENCOUNTER — Emergency Department

## 2023-09-28 ENCOUNTER — Other Ambulatory Visit: Payer: Self-pay

## 2023-09-28 DIAGNOSIS — K219 Gastro-esophageal reflux disease without esophagitis: Secondary | ICD-10-CM | POA: Diagnosis not present

## 2023-09-28 DIAGNOSIS — E039 Hypothyroidism, unspecified: Secondary | ICD-10-CM | POA: Diagnosis not present

## 2023-09-28 DIAGNOSIS — R1013 Epigastric pain: Secondary | ICD-10-CM | POA: Diagnosis present

## 2023-09-28 LAB — BASIC METABOLIC PANEL WITH GFR
Anion gap: 11 (ref 5–15)
BUN: 13 mg/dL (ref 6–20)
CO2: 19 mmol/L — ABNORMAL LOW (ref 22–32)
Calcium: 9.5 mg/dL (ref 8.9–10.3)
Chloride: 105 mmol/L (ref 98–111)
Creatinine, Ser: 0.97 mg/dL (ref 0.61–1.24)
GFR, Estimated: >60 mL/min (ref >60)
Glucose, Bld: 95 mg/dL (ref 70–99)
Potassium: 3.5 mmol/L (ref 3.5–5.1)
Sodium: 135 mmol/L (ref 135–145)

## 2023-09-28 LAB — CBC
HCT: 41.1 % (ref 39.0–52.0)
Hemoglobin: 13 g/dL (ref 13.0–17.0)
MCH: 27.5 pg (ref 26.0–34.0)
MCHC: 31.6 g/dL (ref 30.0–36.0)
MCV: 87.1 fL (ref 80.0–100.0)
Platelets: 201 10*3/uL (ref 150–400)
RBC: 4.72 MIL/uL (ref 4.22–5.81)
RDW: 13.7 % (ref 11.5–15.5)
WBC: 6.9 10*3/uL (ref 4.0–10.5)
nRBC: 0 % (ref 0.0–0.2)

## 2023-09-28 LAB — TROPONIN I (HIGH SENSITIVITY): Troponin I (High Sensitivity): <2 ng/L (ref <18)

## 2023-09-28 NOTE — ED Triage Notes (Signed)
 Pt to ED via POV c/o nausea/vomiting and CP. States it has been going on for a couple days. Usually happens after he eats. Denies abd pain, shob, dizziness.

## 2023-09-29 ENCOUNTER — Emergency Department
Admission: EM | Admit: 2023-09-29 | Discharge: 2023-09-29 | Disposition: A | Discharge status: Home / Self Care | Attending: Emergency Medicine | Admitting: Emergency Medicine

## 2023-09-29 ENCOUNTER — Emergency Department

## 2023-09-29 DIAGNOSIS — K219 Gastro-esophageal reflux disease without esophagitis: Secondary | ICD-10-CM | POA: Diagnosis not present

## 2023-09-29 DIAGNOSIS — R112 Nausea with vomiting, unspecified: Secondary | ICD-10-CM

## 2023-09-29 LAB — HEPATIC FUNCTION PANEL
ALT: 17 U/L (ref 0–44)
AST: 18 U/L (ref 15–41)
Albumin: 4.8 g/dL (ref 3.5–5.0)
Alkaline Phosphatase: 72 U/L (ref 38–126)
Bilirubin, Direct: 0.1 mg/dL (ref 0.0–0.2)
Indirect Bilirubin: 0.8 mg/dL (ref 0.3–0.9)
Total Bilirubin: 0.9 mg/dL (ref 0.0–1.2)
Total Protein: 7.8 g/dL (ref 6.5–8.1)

## 2023-09-29 LAB — LIPASE, BLOOD: Lipase: 28 U/L (ref 11–51)

## 2023-09-29 LAB — TROPONIN I (HIGH SENSITIVITY): Troponin I (High Sensitivity): <2 ng/L (ref <18)

## 2023-09-29 MED ORDER — IOHEXOL 300 MG/ML  SOLN
100.0000 mL | Freq: Once | INTRAMUSCULAR | Status: AC | PRN
  Administered 2023-09-29: 100 mL via INTRAVENOUS

## 2023-09-29 MED ORDER — ONDANSETRON HCL 4 MG/2ML IJ SOLN
4.0000 mg | Freq: Once | INTRAMUSCULAR | Status: AC
  Administered 2023-09-29: 4 mg via INTRAVENOUS
  Filled 2023-09-29: qty 2

## 2023-09-29 MED ORDER — FAMOTIDINE IN NACL 20-0.9 MG/50ML-% IV SOLN
20.0000 mg | Freq: Once | INTRAVENOUS | Status: AC
  Administered 2023-09-29: 20 mg via INTRAVENOUS
  Filled 2023-09-29: qty 50

## 2023-09-29 NOTE — ED Provider Notes (Signed)
 [facility] Provider Note    Event Date/Time   First MD Initiated Contact with Patient 09/29/23 0105     (approximate)   History   Emesis and Chest Pain   HPI  Caelen Nyborg is a 43 y.o. male with a history of schizoaffective disorder, hypothyroidism, dyslipidemia, and intellectual disability who presents with vomiting and chest pain.  The patient states that 2 days ago he started having vomiting after eating some ravioli.  Subsequently yesterday he was able to eat Pia Brew and has not vomited further, but is now having some epigastric and chest pain that is burning.  He still feels nauseous.  He denies any lower abdominal pain or diarrhea.  I reviewed the past medical records.  The patient's most recent prior encounters were with general surgery in August of last year for surgery for an incisional incarcerated hernia.   Physical Exam   Triage Vital Signs: ED Triage Vitals  Encounter Vitals Group     BP 09/28/23 2055 (!) 127/92     Systolic BP Percentile --      Diastolic BP Percentile --      Pulse Rate 09/28/23 2055 (!) 101     Resp 09/28/23 2055 18     Temp 09/28/23 2055 98.7 F (37.1 C)     Temp src --      SpO2 09/28/23 2055 99 %     Weight 09/28/23 2055 210 lb (95.3 kg)     Height 09/28/23 2055 5\' 10"  (1.778 m)     Head Circumference --      Peak Flow --      Pain Score 09/28/23 2102 3     Pain Loc --      Pain Education --      Exclude from Growth Chart --     Most recent vital signs: Vitals:   09/28/23 2055 09/29/23 0025  BP: (!) 127/92 (!) 141/101  Pulse: (!) 101 88  Resp: 18 16  Temp: 98.7 F (37.1 C) 98.6 F (37 C)  SpO2: 99% 100%     General: Awake, no distress.  CV:  Good peripheral perfusion.  Resp:  Normal effort.  Abd:  Soft with mild epigastric discomfort.  No distention.  Other:  No jaundice or scleral icterus.   ED Results / Procedures / Treatments   Labs (all labs ordered are listed, but only abnormal  results are displayed) Labs Reviewed  BASIC METABOLIC PANEL WITH GFR - Abnormal; Notable for the following components:      Result Value   CO2 19 (*)    All other components within normal limits  CBC  HEPATIC FUNCTION PANEL  LIPASE, BLOOD  TROPONIN I (HIGH SENSITIVITY)  TROPONIN I (HIGH SENSITIVITY)     EKG  ED ECG REPORT I, Lind Repine, the attending physician, personally viewed and interpreted this ECG.  Date: 09/28/2023 EKG Time: 2104 Rate: 99 Rhythm: normal sinus rhythm QRS Axis: normal Intervals: normal ST/T Wave abnormalities: normal Narrative Interpretation: no evidence of acute ischemia    RADIOLOGY  Chest x-ray: I independently viewed and interpreted the images; there is no focal consolidation or edema  CT abdomen/pelvis:   IMPRESSION:  1. No acute findings. Normal appendix.  2. Small bilateral pleural effusions, unchanged.    PROCEDURES:  Critical Care performed: No  Procedures   MEDICATIONS ORDERED IN ED: Medications  famotidine  (PEPCID ) IVPB 20 mg premix (0 mg Intravenous Stopped 09/29/23 0234)  ondansetron  (ZOFRAN ) injection 4 mg (  4 mg Intravenous Given 09/29/23 0201)  iohexol  (OMNIPAQUE ) 300 MG/ML solution 100 mL (100 mLs Intravenous Contrast Given 09/29/23 0251)     IMPRESSION / MDM / ASSESSMENT AND PLAN / ED COURSE  I reviewed the triage vital signs and the nursing notes.  43 year old male with PMH as noted above presents with vomiting and epigastric/chest pain.  The vomiting is now resolved although the patient does report persistent nausea.  On exam his vital signs are normal.  Abdomen is soft with mild epigastric discomfort but no focal tenderness.  Differential diagnosis includes, but is not limited to, gastroenteritis, foodborne illness, gastritis, GERD, gastroparesis.  BMP, LFTs, and CBC are all unremarkable.  Troponins are negative.  There is no evidence of ACS.  Given the patient's surgical history will obtain a CT for further  evaluation.  Patient's presentation is most consistent with acute complicated illness / injury requiring diagnostic workup.  ----------------------------------------- 3:29 AM on 09/29/2023 -----------------------------------------  CT is negative.  On reassessment the patient states he is feeling much better after the Pepcid  and Zofran .  At this time, he is stable for discharge home.  Overall presentation is consistent with GERD due to the vomiting.  The patient has been tolerating p.o.  Return precautions given, and he expresses understanding.   FINAL CLINICAL IMPRESSION(S) / ED DIAGNOSES   Final diagnoses:  Nausea and vomiting, unspecified vomiting type  Gastroesophageal reflux disease without esophagitis     Rx / DC Orders   ED Discharge Orders     None        Note:  This document was prepared using Dragon voice recognition software and may include unintentional dictation errors.    Lind Repine, MD 09/29/23 0330

## 2023-09-29 NOTE — Discharge Instructions (Addendum)
 You may take over-the-counter Pepcid  (famotidine ) if needed for any continued chest burning.  Follow-up with your primary care provider.  Return to the ER for new, worsening, or persistent severe chest or abdominal pain, vomiting, or any other new or worsening symptoms that concern you.

## 2024-01-19 ENCOUNTER — Other Ambulatory Visit: Payer: Self-pay

## 2024-01-19 ENCOUNTER — Telehealth: Payer: Self-pay

## 2024-01-19 DIAGNOSIS — Z1211 Encounter for screening for malignant neoplasm of colon: Secondary | ICD-10-CM

## 2024-01-19 MED ORDER — PEG 3350-KCL-NA BICARB-NACL 420 G PO SOLR: 4000.0000 mL | Freq: Once | ORAL | 0 refills | Status: AC

## 2024-01-19 NOTE — Telephone Encounter (Signed)
 Gastroenterology Pre-Procedure Review  Request Date: 05/15/24 Requesting Physician: Dr. Jinny  PATIENT REVIEW QUESTIONS: The patient's caregiver  [facility] responded to the following health history questions as indicated:    1. Are you having any GI issues? no 2. Do you have a personal history of Polyps? no 3. Do you have a family history of Colon Cancer or Polyps? no 4. Diabetes Mellitus? no 5. Joint replacements in the past 12 months?no 6. Major health problems in the past 3 months?no 7. Any artificial heart valves, MVP, or defibrillator?no    MEDICATIONS & ALLERGIES:    Patient reports the following regarding taking any anticoagulation/antiplatelet therapy:   Plavix, Coumadin, Eliquis, Xarelto, Lovenox , Pradaxa, Brilinta, or Effient? no Aspirin? no  Patient confirms/reports the following medications:  Current Outpatient Medications  Medication Sig Dispense Refill   polyethylene glycol-electrolytes (NULYTELY ) 420 g solution Take 4,000 mLs by mouth once for 1 dose. 4000 mL 0   acetaminophen  (TYLENOL ) 325 MG tablet Take 2 tablets (650 mg total) by mouth every 6 (six) hours as needed for mild pain (or Fever >/= 101). 30 tablet 1   albuterol  (VENTOLIN  HFA) 108 (90 Base) MCG/ACT inhaler Inhale 2 puffs into the lungs every 6 (six) hours as needed for wheezing or shortness of breath. 6.7 g 2   benztropine  (COGENTIN ) 0.5 MG tablet Take 1 tablet (0.5 mg total) by mouth 2 (two) times daily. 60 tablet 1   Cholecalciferol  (VITAMIN D3) 50 MCG (2000 UT) capsule Take 2,000 Units by mouth daily.     cloZAPine  (CLOZARIL ) 100 MG tablet Take 2.5 tablets (250 mg total) by mouth at bedtime. 90 tablet 0   Ferrous Fumarate (HEMOCYTE - 106 MG FE) 324 (106 Fe) MG TABS tablet Take 1 tablet by mouth daily.     gabapentin  (NEURONTIN ) 300 MG capsule Take 1 capsule (300 mg total) by mouth 3 (three) times daily for 14 days. 42 capsule 0   hydrOXYzine  (ATARAX ) 50 MG tablet Take 1 tablet (50 mg total) by mouth  every 6 (six) hours as needed for anxiety. 60 tablet 1   levothyroxine  (SYNTHROID ) 25 MCG tablet Take 25 mcg by mouth daily.     lithium  300 MG tablet Take 300 mg by mouth 2 (two) times daily.     lithium  carbonate (ESKALITH ) 450 MG CR tablet Take 2 tablets (900 mg total) by mouth at bedtime. 60 tablet 1   OLANZapine  zydis (ZYPREXA ) 20 MG disintegrating tablet Take 1 tablet (20 mg total) by mouth at bedtime. 30 tablet 1   oxybutynin  (DITROPAN -XL) 10 MG 24 hr tablet Take 1 tablet (10 mg total) by mouth at bedtime. 30 tablet 1   oxyCODONE -acetaminophen  (PERCOCET) 5-325 MG tablet Take 1 tablet by mouth every 4 (four) hours as needed for severe pain. 20 tablet 0   paliperidone  (INVEGA  SUSTENNA) 234 MG/1.5ML SUSY injection Inject 234 mg into the muscle every 28 (twenty-eight) days. 1.8 mL 1   pantoprazole  (PROTONIX ) 40 MG tablet Take 1 tablet (40 mg total) by mouth daily. 30 tablet 1   rosuvastatin  (CRESTOR ) 20 MG tablet Take 20 mg by mouth at bedtime.     sertraline  (ZOLOFT ) 100 MG tablet Take 1 tablet (100 mg total) by mouth daily. 30 tablet 1   traZODone  (DESYREL ) 100 MG tablet Take 1 tablet (100 mg total) by mouth at bedtime. 30 tablet 1   No current facility-administered medications for this visit.    Patient confirms/reports the following allergies:  Allergies  Allergen Reactions   Haldol [  Haloperidol Lactate]    Haldol [Haloperidol Lactate] Other (See Comments)    Unknown   Shellfish Allergy Itching and Rash    No orders of the defined types were placed in this encounter.   AUTHORIZATION INFORMATION Primary Insurance: 1D#: Group #:  Secondary Insurance: 1D#: Group #:  SCHEDULE INFORMATION: Date: 05/15/24 Time: Location: [facility]

## 2024-05-14 ENCOUNTER — Telehealth: Payer: Self-pay

## 2024-05-14 NOTE — Telephone Encounter (Signed)
 Patient's caregiver called stating that the patient is sick and needed to reschedule his procedure to another date. I offered him several days and he was able to reschedule it for 08/14/2024 with Dr. Jinny at [facility]. I then called the endo unit and spoke to Trish to tell her about the change of date.

## 2024-05-15 ENCOUNTER — Ambulatory Visit: Admission: RE | Admit: 2024-05-15 | Admitting: Gastroenterology

## 2024-05-15 SURGERY — COLONOSCOPY
Anesthesia: General

## 2024-08-14 ENCOUNTER — Ambulatory Visit: Admit: 2024-08-14 | Admitting: Gastroenterology

## 2024-08-14 SURGERY — COLONOSCOPY
Anesthesia: General
